# Patient Record
Sex: Female | Born: 1980
Health system: Southern US, Community
[De-identification: ages and names within clinical notes are randomized; demographics above are authoritative.]

## PROBLEM LIST (undated history)

## (undated) DIAGNOSIS — M1990 Unspecified osteoarthritis, unspecified site: Secondary | ICD-10-CM

## (undated) DIAGNOSIS — D649 Anemia, unspecified: Secondary | ICD-10-CM

## (undated) DIAGNOSIS — S199XXA Unspecified injury of neck, initial encounter: Secondary | ICD-10-CM

## (undated) DIAGNOSIS — Z87442 Personal history of urinary calculi: Secondary | ICD-10-CM

## (undated) DIAGNOSIS — D219 Benign neoplasm of connective and other soft tissue, unspecified: Secondary | ICD-10-CM

## (undated) DIAGNOSIS — K219 Gastro-esophageal reflux disease without esophagitis: Secondary | ICD-10-CM

## (undated) HISTORY — PX: WISDOM TOOTH EXTRACTION: SHX21

## (undated) HISTORY — DX: Unspecified osteoarthritis, unspecified site: M19.90

---
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
## 2000-07-19 ENCOUNTER — Inpatient Hospital Stay (HOSPITAL_COMMUNITY): Admission: AD | Admit: 2000-07-19 | Discharge: 2000-07-19 | Payer: Self-pay | Admitting: Obstetrics & Gynecology

## 2000-07-19 ENCOUNTER — Encounter: Payer: Self-pay | Admitting: Obstetrics & Gynecology

## 2003-06-05 ENCOUNTER — Emergency Department (HOSPITAL_COMMUNITY): Admission: EM | Admit: 2003-06-05 | Discharge: 2003-06-05 | Payer: Self-pay | Admitting: Emergency Medicine

## 2005-07-12 ENCOUNTER — Emergency Department (HOSPITAL_COMMUNITY): Admission: EM | Admit: 2005-07-12 | Discharge: 2005-07-12 | Payer: Self-pay | Admitting: Emergency Medicine

## 2006-04-05 ENCOUNTER — Emergency Department (HOSPITAL_COMMUNITY): Admission: EM | Admit: 2006-04-05 | Discharge: 2006-04-05 | Payer: Self-pay | Admitting: Emergency Medicine

## 2008-02-12 ENCOUNTER — Inpatient Hospital Stay (HOSPITAL_COMMUNITY): Admission: AD | Admit: 2008-02-12 | Discharge: 2008-02-12 | Payer: Self-pay | Admitting: Obstetrics & Gynecology

## 2008-03-10 ENCOUNTER — Ambulatory Visit: Payer: Self-pay | Admitting: Family Medicine

## 2008-03-10 LAB — CONVERTED CEMR LAB
AST: 16 units/L (ref 0–37)
Albumin: 4.5 g/dL (ref 3.5–5.2)
BUN: 10 mg/dL (ref 6–23)
Basophils Relative: 0 % (ref 0–1)
Calcium: 10.2 mg/dL (ref 8.4–10.5)
Chloride: 107 meq/L (ref 96–112)
Lymphs Abs: 1.8 10*3/uL (ref 0.7–4.0)
Monocytes Relative: 5 % (ref 3–12)
Neutro Abs: 8.4 10*3/uL — ABNORMAL HIGH (ref 1.7–7.7)
Neutrophils Relative %: 76 % (ref 43–77)
Potassium: 4.2 meq/L (ref 3.5–5.3)
RBC: 5 M/uL (ref 3.87–5.11)
Sodium: 139 meq/L (ref 135–145)
Total Protein: 7.8 g/dL (ref 6.0–8.3)
WBC: 11 10*3/uL — ABNORMAL HIGH (ref 4.0–10.5)

## 2008-04-14 ENCOUNTER — Encounter (INDEPENDENT_AMBULATORY_CARE_PROVIDER_SITE_OTHER): Payer: Self-pay | Admitting: Family Medicine

## 2008-04-14 ENCOUNTER — Ambulatory Visit: Payer: Self-pay | Admitting: Family Medicine

## 2008-04-20 ENCOUNTER — Encounter: Admission: RE | Admit: 2008-04-20 | Discharge: 2008-04-20 | Payer: Self-pay | Admitting: Family Medicine

## 2008-04-28 ENCOUNTER — Ambulatory Visit: Payer: Self-pay | Admitting: Family Medicine

## 2008-07-20 ENCOUNTER — Ambulatory Visit: Payer: Self-pay | Admitting: Internal Medicine

## 2008-10-12 ENCOUNTER — Ambulatory Visit: Payer: Self-pay | Admitting: Internal Medicine

## 2009-02-23 ENCOUNTER — Ambulatory Visit: Payer: Self-pay | Admitting: Family Medicine

## 2009-05-27 ENCOUNTER — Ambulatory Visit: Payer: Self-pay | Admitting: Family Medicine

## 2009-06-22 ENCOUNTER — Ambulatory Visit: Payer: Self-pay | Admitting: Family Medicine

## 2009-11-19 ENCOUNTER — Emergency Department (HOSPITAL_COMMUNITY): Admission: EM | Admit: 2009-11-19 | Discharge: 2009-11-19 | Payer: Self-pay | Admitting: Emergency Medicine

## 2010-01-26 ENCOUNTER — Emergency Department (HOSPITAL_COMMUNITY)
Admission: EM | Admit: 2010-01-26 | Discharge: 2010-01-27 | Payer: Self-pay | Source: Home / Self Care | Admitting: Emergency Medicine

## 2010-05-11 LAB — DIFFERENTIAL
Basophils Absolute: 0 10*3/uL (ref 0.0–0.1)
Eosinophils Relative: 1 % (ref 0–5)
Lymphocytes Relative: 35 % (ref 12–46)
Lymphs Abs: 4.1 10*3/uL — ABNORMAL HIGH (ref 0.7–4.0)
Monocytes Absolute: 0.7 10*3/uL (ref 0.1–1.0)
Neutro Abs: 6.7 10*3/uL (ref 1.7–7.7)

## 2010-05-11 LAB — HEPATIC FUNCTION PANEL
ALT: 11 U/L (ref 0–35)
AST: 20 U/L (ref 0–37)
Alkaline Phosphatase: 77 U/L (ref 39–117)
Bilirubin, Direct: 0.1 mg/dL (ref 0.0–0.3)
Indirect Bilirubin: 0.5 mg/dL (ref 0.3–0.9)

## 2010-05-11 LAB — BASIC METABOLIC PANEL
Chloride: 111 mEq/L (ref 96–112)
GFR calc Af Amer: >60 mL/min (ref >60)
GFR calc non Af Amer: >60 mL/min (ref >60)
Potassium: 3.8 mEq/L (ref 3.5–5.1)
Sodium: 140 mEq/L (ref 135–145)

## 2010-05-11 LAB — URINALYSIS, ROUTINE W REFLEX MICROSCOPIC
Glucose, UA: NEGATIVE mg/dL
Ketones, ur: NEGATIVE mg/dL
Nitrite: NEGATIVE
Specific Gravity, Urine: 1.01 (ref 1.005–1.030)
pH: 7 (ref 5.0–8.0)

## 2010-05-11 LAB — CBC
HCT: 35.4 % — ABNORMAL LOW (ref 36.0–46.0)
Hemoglobin: 11.9 g/dL — ABNORMAL LOW (ref 12.0–15.0)
MCV: 75 fL — ABNORMAL LOW (ref 78.0–100.0)
RBC: 4.72 MIL/uL (ref 3.87–5.11)
WBC: 11.6 10*3/uL — ABNORMAL HIGH (ref 4.0–10.5)

## 2010-10-07 ENCOUNTER — Ambulatory Visit (INDEPENDENT_AMBULATORY_CARE_PROVIDER_SITE_OTHER): Payer: Self-pay

## 2010-10-07 ENCOUNTER — Inpatient Hospital Stay (INDEPENDENT_AMBULATORY_CARE_PROVIDER_SITE_OTHER)
Admission: RE | Admit: 2010-10-07 | Discharge: 2010-10-07 | Disposition: A | Discharge status: Home / Self Care | Payer: Self-pay | Source: Ambulatory Visit | Attending: Emergency Medicine | Admitting: Emergency Medicine

## 2010-10-07 DIAGNOSIS — S139XXA Sprain of joints and ligaments of unspecified parts of neck, initial encounter: Secondary | ICD-10-CM

## 2010-10-07 DIAGNOSIS — IMO0002 Reserved for concepts with insufficient information to code with codable children: Secondary | ICD-10-CM

## 2011-05-29 ENCOUNTER — Emergency Department (HOSPITAL_COMMUNITY)
Admission: EM | Admit: 2011-05-29 | Discharge: 2011-05-29 | Disposition: A | Discharge status: Home / Self Care | Payer: Self-pay | Source: Home / Self Care | Attending: Family Medicine | Admitting: Family Medicine

## 2011-05-29 NOTE — ED Notes (Signed)
Called pt at  2:25 pm  And no answer in lobby

## 2011-05-29 NOTE — ED Notes (Signed)
Pt  Called for 2nd time @ 3:15 pm  Did not answer.

## 2012-03-01 ENCOUNTER — Telehealth (HOSPITAL_COMMUNITY): Payer: Self-pay | Admitting: *Deleted

## 2012-03-01 NOTE — Telephone Encounter (Signed)
Telephoned patient at home # and left message to return call to [facility].

## 2012-03-08 ENCOUNTER — Other Ambulatory Visit: Payer: Self-pay | Admitting: Obstetrics and Gynecology

## 2012-03-08 ENCOUNTER — Encounter (HOSPITAL_COMMUNITY): Payer: Self-pay

## 2012-03-08 ENCOUNTER — Ambulatory Visit (HOSPITAL_COMMUNITY)
Admission: RE | Admit: 2012-03-08 | Discharge: 2012-03-08 | Disposition: A | Discharge status: Home / Self Care | Payer: Self-pay | Source: Ambulatory Visit | Attending: Obstetrics and Gynecology | Admitting: Obstetrics and Gynecology

## 2012-03-08 VITALS — BP 100/64 | Temp 98.9°F | Ht 63.0 in | Wt 126.8 lb

## 2012-03-08 DIAGNOSIS — N63 Unspecified lump in unspecified breast: Secondary | ICD-10-CM

## 2012-03-08 DIAGNOSIS — Z01419 Encounter for gynecological examination (general) (routine) without abnormal findings: Secondary | ICD-10-CM

## 2012-03-08 NOTE — Progress Notes (Signed)
Complaints of right breast lump x 1 year that is painful at times. Patient rates pain at a 2 out of 10.  Pap Smear:  Completed Pap smear today. Last Pap smear was 04/14/2008 and normal. Per patient has no history of abnormal Pap smears. Pap smear result above in EPIC.  Physical exam: Breasts Breasts symmetrical. No skin abnormalities bilateral breasts. No nipple retraction bilateral breasts. No nipple discharge bilateral breasts. No lymphadenopathy. Palpated lump in left breast at 12 o'clock 2 cm from the areola. Palpated 3 lumps in right breast at 10 o'clock 1 cm from the areola, 11 o'clock 1 cm from the areola, and 12 o'clock 1 cm from the areola. Complaints of tenderness when palpated right center breast. Patient referred to the [facility] for diagnostic mammogram and possible ultrasound bilateral breasts. Appointment scheduled for Friday, March 15, 2012 at 1400.         Pelvic/Bimanual   Ext Genitalia No lesions, no swelling and no discharge observed on external genitalia.         Vagina Vagina pink and normal texture. No lesions or discharge observed in vagina.          Cervix Cervix is present. Cervix pink and of normal texture. No discharge observed on cervix.         Uterus Uterus is present and palpable. Uterus in normal position and normal size.       Adnexae Bilateral ovaries present and palpable. No tenderness on palpation.        Rectovaginal No rectal exam completed today since patient had no rectal complaints. No skin abnormalties observed on exam.

## 2012-03-08 NOTE — Addendum Note (Signed)
Encounter addended by: Lurlean Horns, LPN on: 08/17/3084  3:18 PM<BR>     Documentation filed: Visit Diagnoses, Orders

## 2012-03-08 NOTE — Addendum Note (Signed)
Encounter addended by: Saintclair Halsted, RN on: 03/08/2012  4:20 PM<BR>     Documentation filed: Visit Diagnoses, Patient Instructions Section

## 2012-03-08 NOTE — Patient Instructions (Addendum)
Taught patient how to perform BSE and gave educational materials to take home. Let her know [facility] will cover Pap smears every 3 years unless has a history of abnormal Pap smears. Patient referred to the [facility] for diagnostic mammogram and possible ultrasound bilateral breasts. Appointment scheduled for Friday, March 15, 2012 at 1400. Patient aware of appointment and will be there. Let patient know will follow up with her within the next couple weeks with results to Pap smear by letter or phone. Patient verbalized understanding.

## 2012-03-13 ENCOUNTER — Other Ambulatory Visit: Payer: Self-pay | Admitting: *Deleted

## 2012-03-13 DIAGNOSIS — A599 Trichomoniasis, unspecified: Secondary | ICD-10-CM

## 2012-03-13 MED ORDER — METRONIDAZOLE 500 MG PO TABS: 500.0000 mg | ORAL_TABLET | Freq: Two times a day (BID) | ORAL | Status: DC

## 2012-03-15 ENCOUNTER — Ambulatory Visit
Admission: RE | Admit: 2012-03-15 | Discharge: 2012-03-15 | Disposition: A | Discharge status: Home / Self Care | Payer: No Typology Code available for payment source | Source: Ambulatory Visit | Attending: Obstetrics and Gynecology | Admitting: Obstetrics and Gynecology

## 2012-03-15 DIAGNOSIS — N63 Unspecified lump in unspecified breast: Secondary | ICD-10-CM

## 2012-07-06 ENCOUNTER — Emergency Department (HOSPITAL_COMMUNITY)
Admission: EM | Admit: 2012-07-06 | Discharge: 2012-07-06 | Disposition: A | Discharge status: Home / Self Care | Payer: Self-pay | Attending: Emergency Medicine | Admitting: Emergency Medicine

## 2012-07-06 ENCOUNTER — Emergency Department (HOSPITAL_COMMUNITY): Payer: Self-pay

## 2012-07-06 ENCOUNTER — Encounter (HOSPITAL_COMMUNITY): Payer: Self-pay | Admitting: *Deleted

## 2012-07-06 DIAGNOSIS — F17200 Nicotine dependence, unspecified, uncomplicated: Secondary | ICD-10-CM | POA: Insufficient documentation

## 2012-07-06 DIAGNOSIS — Y93E6 Activity, residential relocation: Secondary | ICD-10-CM | POA: Insufficient documentation

## 2012-07-06 DIAGNOSIS — M79672 Pain in left foot: Secondary | ICD-10-CM

## 2012-07-06 DIAGNOSIS — W208XXA Other cause of strike by thrown, projected or falling object, initial encounter: Secondary | ICD-10-CM | POA: Insufficient documentation

## 2012-07-06 DIAGNOSIS — S8990XA Unspecified injury of unspecified lower leg, initial encounter: Secondary | ICD-10-CM | POA: Insufficient documentation

## 2012-07-06 DIAGNOSIS — Y929 Unspecified place or not applicable: Secondary | ICD-10-CM | POA: Insufficient documentation

## 2012-07-06 MED ORDER — IBUPROFEN 400 MG PO TABS
800.0000 mg | ORAL_TABLET | Freq: Once | ORAL | Status: AC
  Administered 2012-07-06: 800 mg via ORAL
  Filled 2012-07-06: qty 2

## 2012-07-06 NOTE — ED Provider Notes (Signed)
History    This chart was scribed for non-physician practitioner working with Carleene Cooper III, MD by Sofie Rower, ED Scribe. This patient was seen in room TR05C/TR05C and the patient's care was started at 6:15Pm.   CSN: [id number]  Arrival date & time 07/06/12  1649   First MD Initiated Contact with Patient 07/06/12 1815      Chief Complaint  Patient presents with  . Foot Swelling    (Consider location/radiation/quality/duration/timing/severity/associated sxs/prior treatment) Patient is a 32 y.o. female presenting with lower extremity pain. The history is provided by the patient. No language interpreter was used.  Foot Pain This is a new problem. The current episode started 1 to 2 hours ago. The problem occurs constantly. The problem has been gradually worsening. Pertinent negatives include no chest pain, no headaches and no shortness of breath. The symptoms are aggravated by bending, stress and walking. Nothing relieves the symptoms. She has tried nothing for the symptoms. The treatment provided no relief.    Katie Benson is a 32 y.o. female , with no known medical hx, who presents to the Emergency Department complaining of sudden, progressively worsening, non radiating, pain located on the dorsum of the left foot distal to the first three metatarsals. Onset today (07/06/12).  Associated symptoms include swelling located at the left foot. The pt reports she was moving and dropped a refrigerator upon her left foot earlier today (07/06/12). Immediately after the refrigerator fell onto her left foot, the pt informs she began to notice an immediate pain, followed by moderate swelling. The pain and swelling has prompted the pt's concern and desire to seek medical evaluation at [facility] this evening (07/06/12). Modifying factors include application of pressure in addition to ambulation, which intensifies the foot pain. Pt took no interventions. Pain is worse with weight bearing.   The pt is a current  everyday smoker, in addition to drinking alcohol occasionally.   Pt does not have a PCP.    History reviewed. No pertinent past medical history.  History reviewed. No pertinent past surgical history.  Family History  Problem Relation Age of Onset  . Diabetes Sister   . Cancer Maternal Grandmother     ovarian    History  Substance Use Topics  . Smoking status: Current Every Day Smoker -- 0.25 packs/day for 14 years    Types: Cigarettes  . Smokeless tobacco: Not on file  . Alcohol Use: Yes     Comment: occassionally on weekends    OB History   Grav Para Term Preterm Abortions TAB SAB Ect Mult Living   2 1 1  1     1       Review of Systems  Constitutional: Negative for fever and diaphoresis.  HENT: Negative for neck pain and neck stiffness.   Eyes: Negative for visual disturbance.  Respiratory: Negative for apnea, chest tightness and shortness of breath.   Cardiovascular: Negative for chest pain and palpitations.  Gastrointestinal: Negative for nausea, vomiting, diarrhea and constipation.  Genitourinary: Negative for dysuria.  Musculoskeletal: Positive for arthralgias. Negative for gait problem.       Left foot pain, top of foot  Skin: Negative for color change, rash and wound.  Neurological: Negative for dizziness, weakness, light-headedness, numbness and headaches.    Allergies  Pork-derived products  Home Medications   Current Outpatient Rx  Name  Route  Sig  Dispense  Refill  . cyclobenzaprine (FLEXERIL) 10 MG tablet   Oral   Take 10 mg by mouth  3 (three) times daily as needed for muscle spasms.            BP 137/80  Pulse 101  Temp(Src) 98.7 F (37.1 C) (Oral)  Resp 20  SpO2 100%  LMP 06/15/2012  Physical Exam  Nursing note and vitals reviewed. Constitutional: She is oriented to person, place, and time. She appears well-developed and well-nourished. No distress.  HENT:  Head: Normocephalic and atraumatic.  Eyes: Conjunctivae and EOM are  normal.  Neck: Normal range of motion. Neck supple.  No meningeal signs  Cardiovascular: Normal rate, regular rhythm, normal heart sounds and intact distal pulses.  Exam reveals no gallop and no friction rub.   No murmur heard. Pulmonary/Chest: Effort normal and breath sounds normal. No respiratory distress. She has no wheezes. She has no rales. She exhibits no tenderness.  Abdominal: Soft. Bowel sounds are normal. She exhibits no distension. There is no tenderness. There is no rebound and no guarding.  Musculoskeletal: Normal range of motion. She exhibits no edema and no tenderness.  Normal strength in upper and lower extremities bilaterally including dorsiflexion and plantar flexion, strong and equal grip strength. FROM to injured left foot. Dorsal pulses intact.   Neurological: She is alert and oriented to person, place, and time. No cranial nerve deficit.  Speech is clear and goal oriented, follows commands Sensation normal to light touch Moves extremities without ataxia, coordination intact Pt able to ambulate with pain to injured left foot  Skin: Skin is warm and dry. She is not diaphoretic. No erythema.  Psychiatric: She has a normal mood and affect.    ED Course  Procedures (including critical care time)  DIAGNOSTIC STUDIES: Oxygen Saturation is 100% on room air, normal by my interpretation.    COORDINATION OF CARE:  6:41 PM- Treatment plan discussed with patient. Pt agrees with treatment.   Medications  ibuprofen (ADVIL,MOTRIN) tablet 800 mg (not administered)      Labs Reviewed - No data to display Dg Foot Complete Left  07/06/2012  *RADIOLOGY REPORT*  Clinical Data: Left foot injury with pain and swelling.  LEFT FOOT - COMPLETE 3+ VIEW  Comparison: None  Findings: There is no evidence of acute fracture, subluxation, or dislocation. The Lisfranc joints are intact. No focal bony lesions are identified. There is no evidence of radiopaque foreign body.  The joint spaces are  unremarkable.  IMPRESSION: Unremarkable left foot.   Original Report Authenticated By: Harmon Pier, M.D.      1. Left foot pain       MDM  Managed pt pain with ibuprofen. Imaging shows no fracture. Directed pt to ice injury, take acetaminophen or ibuprofen for pain, and to elevate and rest the injury when possible. Pt understood diagnosis and was in agreement with plan.   I personally performed the services described in this documentation, which was scribed in my presence. The recorded information has been reviewed and is accurate.    Glade Nurse, PA-C 07/07/12 0000

## 2012-07-06 NOTE — ED Notes (Signed)
Pt in c/o left foot swelling and pain after dropping a refrigerator door on it earlier today. Pt ambulatory to room.

## 2012-07-08 NOTE — ED Provider Notes (Signed)
Medical screening examination/treatment/procedure(s) were performed by non-physician practitioner and as supervising physician I was immediately available for consultation/collaboration.   Carleene Cooper III, MD 07/08/12 1500

## 2012-10-29 ENCOUNTER — Encounter (HOSPITAL_COMMUNITY): Payer: Self-pay

## 2012-10-29 ENCOUNTER — Inpatient Hospital Stay (HOSPITAL_COMMUNITY)
Admission: AD | Admit: 2012-10-29 | Discharge: 2012-10-29 | Disposition: A | Discharge status: Home / Self Care | Payer: Medicaid Other | Source: Ambulatory Visit | Attending: Obstetrics and Gynecology | Admitting: Obstetrics and Gynecology

## 2012-10-29 DIAGNOSIS — A499 Bacterial infection, unspecified: Secondary | ICD-10-CM | POA: Insufficient documentation

## 2012-10-29 DIAGNOSIS — N949 Unspecified condition associated with female genital organs and menstrual cycle: Secondary | ICD-10-CM | POA: Insufficient documentation

## 2012-10-29 DIAGNOSIS — B9689 Other specified bacterial agents as the cause of diseases classified elsewhere: Secondary | ICD-10-CM | POA: Insufficient documentation

## 2012-10-29 DIAGNOSIS — N760 Acute vaginitis: Secondary | ICD-10-CM | POA: Insufficient documentation

## 2012-10-29 HISTORY — DX: Unspecified injury of neck, initial encounter: S19.9XXA

## 2012-10-29 LAB — URINALYSIS, ROUTINE W REFLEX MICROSCOPIC
Glucose, UA: NEGATIVE mg/dL
Leukocytes, UA: NEGATIVE
Protein, ur: NEGATIVE mg/dL
pH: 6 (ref 5.0–8.0)

## 2012-10-29 LAB — WET PREP, GENITAL: Trich, Wet Prep: NONE SEEN

## 2012-10-29 LAB — POCT PREGNANCY, URINE: Preg Test, Ur: NEGATIVE

## 2012-10-29 LAB — URINE MICROSCOPIC-ADD ON

## 2012-10-29 MED ORDER — METRONIDAZOLE 500 MG PO TABS: 500.0000 mg | ORAL_TABLET | Freq: Two times a day (BID) | ORAL | Status: DC

## 2012-10-29 NOTE — MAU Provider Note (Signed)
History     CSN: [id number]  Arrival date and time: 10/29/12 0116   First Provider Initiated Contact with Patient 10/29/12 0151      Chief Complaint  Patient presents with  . Vaginal Pain   Vaginal Pain    Katie Benson is a 32 y.o. G2P1011 who presents today with vaginal discomfort. She states that it has been present for about 2 days. She denies any itching or odor, but she has noticed an increase in vaginal discharge.   Past Medical History  Diagnosis Date  . Neck injury     Past Surgical History  Procedure Laterality Date  . Cesarean section      Family History  Problem Relation Age of Onset  . Diabetes Sister   . Cancer Maternal Grandmother     ovarian    History  Substance Use Topics  . Smoking status: Current Every Day Smoker -- 0.25 packs/day for 14 years    Types: Cigarettes  . Smokeless tobacco: Not on file  . Alcohol Use: Yes     Comment: occassionally on weekends    Allergies:  Allergies  Allergen Reactions  . Pork-Derived Products Hives and Swelling    Prescriptions prior to admission  Medication Sig Dispense Refill  . cyclobenzaprine (FLEXERIL) 10 MG tablet Take 10 mg by mouth 3 (three) times daily as needed for muscle spasms.         Review of Systems  Genitourinary: Positive for vaginal pain.   Physical Exam   Blood pressure 125/65, pulse 93, temperature 98.6 F (37 C), temperature source Oral, resp. rate 20, height 5\' 3"  (1.6 m), weight 55.792 kg (123 lb), last menstrual period 10/07/2012, SpO2 99.00%.  Physical Exam  Nursing note and vitals reviewed. Constitutional: She is oriented to person, place, and time. She appears well-developed and well-nourished. No distress.  Cardiovascular: Normal rate.   Respiratory: Effort normal.  GI: Soft. There is no tenderness.  Genitourinary:   External: no lesion Vagina: small amount of white discharge Cervix: pink, smooth, no CMT Uterus: NSSC Adnexa: NT   Neurological: She is alert  and oriented to person, place, and time.  Skin: Skin is warm and dry.  Psychiatric: She has a normal mood and affect.    MAU Course  Procedures  Results for orders placed during the hospital encounter of 10/29/12 (from the past 24 hour(s))  URINALYSIS, ROUTINE W REFLEX MICROSCOPIC     Status: Abnormal   Collection Time    10/29/12  1:25 AM      Result Value Range   Color, Urine YELLOW  YELLOW   APPearance CLEAR  CLEAR   Specific Gravity, Urine >1.030 (*) 1.005 - 1.030   pH 6.0  5.0 - 8.0   Glucose, UA NEGATIVE  NEGATIVE mg/dL   Hgb urine dipstick TRACE (*) NEGATIVE   Bilirubin Urine NEGATIVE  NEGATIVE   Ketones, ur 15 (*) NEGATIVE mg/dL   Protein, ur NEGATIVE  NEGATIVE mg/dL   Urobilinogen, UA 0.2  0.0 - 1.0 mg/dL   Nitrite NEGATIVE  NEGATIVE   Leukocytes, UA NEGATIVE  NEGATIVE  URINE MICROSCOPIC-ADD ON     Status: Abnormal   Collection Time    10/29/12  1:25 AM      Result Value Range   Squamous Epithelial / LPF RARE  RARE   WBC, UA 0-2  <3 WBC/hpf   RBC / HPF 3-6  <3 RBC/hpf   Crystals CA OXALATE CRYSTALS (*) NEGATIVE   Urine-Other MUCOUS PRESENT  POCT PREGNANCY, URINE     Status: None   Collection Time    10/29/12  1:30 AM      Result Value Range   Preg Test, Ur NEGATIVE  NEGATIVE  WET PREP, GENITAL     Status: Abnormal   Collection Time    10/29/12  2:00 AM      Result Value Range   Yeast Wet Prep HPF POC NONE SEEN  NONE SEEN   Trich, Wet Prep NONE SEEN  NONE SEEN   Clue Cells Wet Prep HPF POC MODERATE (*) NONE SEEN   WBC, Wet Prep HPF POC FEW (*) NONE SEEN     Assessment and Plan   1. BV (bacterial vaginosis)    RX: flagyl 500mg  BID #14 FU as needed  Tawnya Crook 10/29/2012, 2:01 AM

## 2012-10-29 NOTE — MAU Provider Note (Signed)
Attestation of Attending Supervision of Advanced Practitioner (CNM/NP): Evaluation and management procedures were performed by the Advanced Practitioner under my supervision and collaboration.  I have reviewed the Advanced Practitioner's note and chart, and I agree with the management and plan.  Trinten Boudoin 10/29/2012 8:06 AM   

## 2012-10-31 ENCOUNTER — Inpatient Hospital Stay (HOSPITAL_COMMUNITY)
Admission: AD | Admit: 2012-10-31 | Discharge: 2012-11-01 | Disposition: A | Discharge status: Home / Self Care | Payer: Medicaid Other | Source: Ambulatory Visit | Attending: Obstetrics & Gynecology | Admitting: Obstetrics & Gynecology

## 2012-10-31 DIAGNOSIS — N949 Unspecified condition associated with female genital organs and menstrual cycle: Secondary | ICD-10-CM | POA: Insufficient documentation

## 2012-10-31 DIAGNOSIS — N750 Cyst of Bartholin's gland: Secondary | ICD-10-CM
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
## 2012-11-01 ENCOUNTER — Encounter (HOSPITAL_COMMUNITY): Payer: Self-pay | Admitting: *Deleted

## 2012-11-01 DIAGNOSIS — N750 Cyst of Bartholin's gland: Secondary | ICD-10-CM

## 2012-11-01 MED ORDER — LIDOCAINE HCL 2 % EX GEL
CUTANEOUS | Status: DC
  Filled 2012-11-01: qty 20

## 2012-11-01 MED ORDER — LIDOCAINE HCL 2 % EX GEL: Freq: Three times a day (TID) | CUTANEOUS | Status: DC | PRN
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
## 2012-11-01 NOTE — MAU Provider Note (Signed)
Chief Complaint: Groin Swelling   First Provider Initiated Contact with Patient 11/01/12 0043     SUBJECTIVE HPI: Katie Benson is a 32 y.o. G2P1011 who presents to maternity admissions reporting vaginal swelling and pain that started after her pelvic exam in MAU 2 days ago. She reports she felt a pinch when the midwife was doing the exam and then developed pain which has gradually gotten more severe.  She denies vaginal bleeding, vaginal itching/burning, urinary symptoms, h/a, dizziness, n/v, or fever/chills.   Past Medical History  Diagnosis Date  . Neck injury    Past Surgical History  Procedure Laterality Date  . Cesarean section     History   Social History  . Marital Status: Single    Spouse Name: N/A    Number of Children: N/A  . Years of Education: N/A   Occupational History  . Not on file.   Social History Main Topics  . Smoking status: Current Every Day Smoker -- 0.25 packs/day for 14 years    Types: Cigarettes  . Smokeless tobacco: Not on file  . Alcohol Use: Yes     Comment: occassionally on weekends  . Drug Use: No  . Sexual Activity: Yes    Birth Control/ Protection: Condom, None   Other Topics Concern  . Not on file   Social History Narrative  . No narrative on file   No current facility-administered medications on file prior to encounter.   Current Outpatient Prescriptions on File Prior to Encounter  Medication Sig Dispense Refill  . cyclobenzaprine (FLEXERIL) 10 MG tablet Take 10 mg by mouth 3 (three) times daily as needed for muscle spasms.       . metroNIDAZOLE (FLAGYL) 500 MG tablet Take 1 tablet (500 mg total) by mouth 2 (two) times daily.  14 tablet  0   Allergies  Allergen Reactions  . Pork-Derived Products Hives and Swelling    ROS: Pertinent items in HPI  OBJECTIVE Blood pressure 111/71, pulse 69, temperature 98.7 F (37.1 C), temperature source Oral, resp. rate 18, last menstrual period 10/07/2012, SpO2 100.00%. GENERAL:  Well-developed, well-nourished female in no acute distress.  HEENT: Normocephalic HEART: normal rate RESP: normal effort ABDOMEN: Soft, non-tender EXTREMITIES: Nontender, no edema NEURO: Alert and oriented SPECULUM EXAM: Deferred  Visual inspection of external genitals reveals enlarged, soft, 3-4 cm cyst of left labia without erythema.    ASSESSMENT 1. Bartholin gland cyst     PLAN Discharge home Topical lidocaine Jelly Pt may take OTC Aleve, may take 2 BID x2-3 days Return to MAU as needed    Medication List    ASK your doctor about these medications       cyclobenzaprine 10 MG tablet  Commonly known as:  FLEXERIL  Take 10 mg by mouth 3 (three) times daily as needed for muscle spasms.     metroNIDAZOLE 500 MG tablet  Commonly known as:  FLAGYL  Take 1 tablet (500 mg total) by mouth 2 (two) times daily.         Sharen Counter Certified Nurse-Midwife 11/01/2012  12:59 AM

## 2012-12-25 DIAGNOSIS — Y9389 Activity, other specified: Secondary | ICD-10-CM | POA: Insufficient documentation

## 2012-12-25 DIAGNOSIS — F17200 Nicotine dependence, unspecified, uncomplicated: Secondary | ICD-10-CM | POA: Insufficient documentation

## 2012-12-25 DIAGNOSIS — Z87828 Personal history of other (healed) physical injury and trauma: Secondary | ICD-10-CM | POA: Insufficient documentation

## 2012-12-25 DIAGNOSIS — S139XXA Sprain of joints and ligaments of unspecified parts of neck, initial encounter: Secondary | ICD-10-CM | POA: Insufficient documentation

## 2012-12-25 DIAGNOSIS — Y92410 Unspecified street and highway as the place of occurrence of the external cause: Secondary | ICD-10-CM | POA: Insufficient documentation

## 2012-12-26 ENCOUNTER — Emergency Department (HOSPITAL_COMMUNITY): Payer: Medicaid Other

## 2012-12-26 ENCOUNTER — Emergency Department (HOSPITAL_COMMUNITY)
Admission: EM | Admit: 2012-12-26 | Discharge: 2012-12-26 | Disposition: A | Discharge status: Home / Self Care | Payer: Medicaid Other | Attending: Emergency Medicine | Admitting: Emergency Medicine

## 2012-12-26 ENCOUNTER — Encounter (HOSPITAL_COMMUNITY): Payer: Self-pay | Admitting: Emergency Medicine

## 2012-12-26 DIAGNOSIS — S161XXA Strain of muscle, fascia and tendon at neck level, initial encounter: Secondary | ICD-10-CM

## 2012-12-26 MED ORDER — OXYCODONE-ACETAMINOPHEN 5-325 MG PO TABS
2.0000 | ORAL_TABLET | Freq: Once | ORAL | Status: AC
  Administered 2012-12-26: 2 via ORAL
  Filled 2012-12-26: qty 2

## 2012-12-26 MED ORDER — DIAZEPAM 5 MG PO TABS
5.0000 mg | ORAL_TABLET | Freq: Once | ORAL | Status: AC
  Administered 2012-12-26: 5 mg via ORAL
  Filled 2012-12-26: qty 1

## 2012-12-26 MED ORDER — DIAZEPAM 5 MG PO TABS: 5.0000 mg | ORAL_TABLET | Freq: Four times a day (QID) | ORAL | Status: DC | PRN

## 2012-12-26 MED ORDER — OXYCODONE-ACETAMINOPHEN 5-325 MG PO TABS: 1.0000 | ORAL_TABLET | Freq: Four times a day (QID) | ORAL | Status: DC | PRN

## 2012-12-26 NOTE — ED Notes (Signed)
To x-ray

## 2012-12-26 NOTE — ED Provider Notes (Signed)
Medical screening examination/treatment/procedure(s) were performed by non-physician practitioner and as supervising physician I was immediately available for consultation/collaboration.   Dione Booze, MD 12/26/12 [phone]
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
## 2013-01-14 ENCOUNTER — Emergency Department (HOSPITAL_COMMUNITY)
Admission: EM | Admit: 2013-01-14 | Discharge: 2013-01-14 | Disposition: A | Discharge status: Home / Self Care | Payer: No Typology Code available for payment source | Attending: Emergency Medicine | Admitting: Emergency Medicine

## 2013-01-14 ENCOUNTER — Encounter (HOSPITAL_COMMUNITY): Payer: Self-pay | Admitting: Emergency Medicine

## 2013-01-14 ENCOUNTER — Emergency Department (HOSPITAL_COMMUNITY): Payer: No Typology Code available for payment source

## 2013-01-14 DIAGNOSIS — R51 Headache: Secondary | ICD-10-CM | POA: Insufficient documentation

## 2013-01-14 DIAGNOSIS — M25519 Pain in unspecified shoulder: Secondary | ICD-10-CM | POA: Insufficient documentation

## 2013-01-14 DIAGNOSIS — Z3202 Encounter for pregnancy test, result negative: Secondary | ICD-10-CM | POA: Insufficient documentation

## 2013-01-14 DIAGNOSIS — M25562 Pain in left knee: Secondary | ICD-10-CM

## 2013-01-14 DIAGNOSIS — M542 Cervicalgia: Secondary | ICD-10-CM

## 2013-01-14 DIAGNOSIS — M25569 Pain in unspecified knee: Secondary | ICD-10-CM | POA: Insufficient documentation

## 2013-01-14 DIAGNOSIS — G8911 Acute pain due to trauma: Secondary | ICD-10-CM | POA: Insufficient documentation

## 2013-01-14 DIAGNOSIS — M25511 Pain in right shoulder: Secondary | ICD-10-CM

## 2013-01-14 DIAGNOSIS — F17200 Nicotine dependence, unspecified, uncomplicated: Secondary | ICD-10-CM | POA: Insufficient documentation

## 2013-01-14 DIAGNOSIS — Z87828 Personal history of other (healed) physical injury and trauma: Secondary | ICD-10-CM | POA: Insufficient documentation

## 2013-01-14 DIAGNOSIS — M549 Dorsalgia, unspecified: Secondary | ICD-10-CM | POA: Insufficient documentation

## 2013-01-14 DIAGNOSIS — M79609 Pain in unspecified limb: Secondary | ICD-10-CM | POA: Insufficient documentation

## 2013-01-14 LAB — URINALYSIS, ROUTINE W REFLEX MICROSCOPIC
Bilirubin Urine: NEGATIVE
Glucose, UA: NEGATIVE mg/dL
Hgb urine dipstick: NEGATIVE
Ketones, ur: NEGATIVE mg/dL
Protein, ur: NEGATIVE mg/dL
pH: 6.5 (ref 5.0–8.0)

## 2013-01-14 LAB — URINE MICROSCOPIC-ADD ON

## 2013-01-14 MED ORDER — NAPROXEN 500 MG PO TABS: 500.0000 mg | ORAL_TABLET | Freq: Two times a day (BID) | ORAL | Status: DC

## 2013-01-14 MED ORDER — CYCLOBENZAPRINE HCL 5 MG PO TABS
5.0000 mg | ORAL_TABLET | Freq: Three times a day (TID) | ORAL | Status: DC | PRN
Start: 2013-01-14 — End: 2013-12-01

## 2013-01-14 MED ORDER — ACETAMINOPHEN 325 MG PO TABS
650.0000 mg | ORAL_TABLET | Freq: Once | ORAL | Status: AC
  Administered 2013-01-14: 650 mg via ORAL
  Filled 2013-01-14: qty 2

## 2013-01-14 MED ORDER — KETOROLAC TROMETHAMINE 30 MG/ML IJ SOLN
30.0000 mg | Freq: Once | INTRAMUSCULAR | Status: AC
  Administered 2013-01-14: 30 mg via INTRAMUSCULAR
  Filled 2013-01-14: qty 1
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
## 2013-01-14 NOTE — Discharge Instructions (Signed)
Follow-up with a primary doctor for further evaluation and management  Use flexeril as needed for muscle spasm - take at night, do not drink alcohol or drive while taking this medication  Take Naprosyn twice daily with food for pain  Rest, ice, elevate your knee/shoulder for pain control  Return to the ED if you have any loss of sensation, weakness, fever, spreading redness/swelling, or other concerns (see below)      Shoulder Pain The shoulder is the joint that connects your arms to your body. The bones that form the shoulder joint include the upper arm bone (humerus), the shoulder blade (scapula), and the collarbone (clavicle). The top of the humerus is shaped like a ball and fits into a rather flat socket on the scapula (glenoid cavity). A combination of muscles and strong, fibrous tissues that connect muscles to bones (tendons) support your shoulder joint and hold the ball in the socket. Small, fluid-filled sacs (bursae) are located in different areas of the joint. They act as cushions between the bones and the overlying soft tissues and help reduce friction between the gliding tendons and the bone as you move your arm. Your shoulder joint allows a wide range of motion in your arm. This range of motion allows you to do things like scratch your back or throw a ball. However, this range of motion also makes your shoulder more prone to pain from overuse and injury. Causes of shoulder pain can originate from both injury and overuse and usually can be grouped in the following four categories:  Redness, swelling, and pain (inflammation) of the tendon (tendinitis) or the bursae (bursitis).  Instability, such as a dislocation of the joint.  Inflammation of the joint (arthritis).  Broken bone (fracture). HOME CARE INSTRUCTIONS   Apply ice to the sore area.  Put ice in a plastic bag.  Place a towel between your skin and the bag.  Leave the ice on for 15-20 minutes, 03-04 times per day for the  first 2 days.  Stop using cold packs if they do not help with the pain.  If you have a shoulder sling or immobilizer, wear it as long as your caregiver instructs. Only remove it to shower or bathe. Move your arm as little as possible, but keep your hand moving to prevent swelling.  Squeeze a soft ball or foam pad as much as possible to help prevent swelling.  Only take over-the-counter or prescription medicines for pain, discomfort, or fever as directed by your caregiver. SEEK MEDICAL CARE IF:   Your shoulder pain increases, or new pain develops in your arm, hand, or fingers.  Your hand or fingers become cold and numb.  Your pain is not relieved with medicines. SEEK IMMEDIATE MEDICAL CARE IF:   Your arm, hand, or fingers are numb or tingling.  Your arm, hand, or fingers are significantly swollen or turn white or blue. MAKE SURE YOU:   Understand these instructions.  Will watch your condition.  Will get help right away if you are not doing well or get worse. Document Released: 11/23/2004 Document Revised: 11/08/2011 Document Reviewed: 01/28/2011 [facility] Patient Information 2014 [place], [place].  Knee Pain Knee pain can be a result of an injury or other medical conditions. Treatment will depend on the cause of your pain. HOME CARE  Only take medicine as told by your doctor.  Keep a healthy weight. Being overweight can make the knee hurt more.  Stretch before exercising or playing sports.  If there is constant  knee pain, change the way you exercise. Ask your doctor for advice.  Make sure shoes fit well. Choose the right shoe for the sport or activity.  Protect your knees. Wear kneepads if needed.  Rest when you are tired. GET HELP RIGHT AWAY IF:   Your knee pain does not stop.  Your knee pain does not get better.  Your knee joint feels hot to the touch.  You have a fever. MAKE SURE YOU:   Understand these instructions.  Will watch this condition.  Will get  help right away if you are not doing well or get worse. Document Released: 05/12/2008 Document Revised: 05/08/2011 Document Reviewed: 05/12/2008 [facility] Patient Information 2014 [place], [place].  Cervical Sprain A cervical sprain is an injury in the neck in which the ligaments are stretched or torn. The ligaments are the tissues that hold the bones of the neck (vertebrae) in place.Cervical sprains can range from very mild to very severe. Most cervical sprains get better in 1 to 3 weeks, but it depends on the cause and extent of the injury. Severe cervical sprains can cause the neck vertebrae to be unstable. This can lead to damage of the spinal cord and can result in serious nervous system problems. Your caregiver will determine whether your cervical sprain is mild or severe. CAUSES  Severe cervical sprains may be caused by:  Contact sport injuries (football, rugby, wrestling, hockey, auto racing, gymnastics, diving, martial arts, boxing).  Motor vehicle collisions.  Whiplash injuries. This means the neck is forcefully whipped backward and forward.  Falls. Mild cervical sprains may be caused by:   Awkward positions, such as cradling a telephone between your ear and shoulder.  Sitting in a chair that does not offer proper support.  Working at a poorly Marketing executive station.  Activities that require looking up or down for long periods of time. SYMPTOMS   Pain, soreness, stiffness, or a burning sensation in the front, back, or sides of the neck. This discomfort may develop immediately after injury or it may develop slowly and not begin for 24 hours or more after an injury.  Pain or tenderness directly in the middle of the back of the neck.  Shoulder or upper back pain.  Limited ability to move the neck.  Headache.  Dizziness.  Weakness, numbness, or tingling in the hands or arms.  Muscle spasms.  Difficulty swallowing or chewing.  Tenderness and swelling of the  neck. DIAGNOSIS  Most of the time, your caregiver can diagnose this problem by taking your history and doing a physical exam. Your caregiver will ask about any known problems, such as arthritis in the neck or a previous neck injury. X-rays may be taken to find out if there are any other problems, such as problems with the bones of the neck. However, an X-ray often does not reveal the full extent of a cervical sprain. Other tests such as a computed tomography (CT) scan or magnetic resonance imaging (MRI) may be needed. TREATMENT  Treatment depends on the severity of the cervical sprain. Mild sprains can be treated with rest, keeping the neck in place (immobilization), and pain medicines. Severe cervical sprains need immediate immobilization and an appointment with an orthopedist or neurosurgeon. Several treatment options are available to help with pain, muscle spasms, and other symptoms. Your caregiver may prescribe:  Medicines, such as pain relievers, numbing medicines, or muscle relaxants.  Physical therapy. This can include stretching exercises, strengthening exercises, and posture training. Exercises and improved posture  can help stabilize the neck, strengthen muscles, and help stop symptoms from returning.  A neck collar to be worn for short periods of time. Often, these collars are worn for comfort. However, certain collars may be worn to protect the neck and prevent further worsening of a serious cervical sprain. HOME CARE INSTRUCTIONS   Put ice on the injured area.  Put ice in a plastic bag.  Place a towel between your skin and the bag.  Leave the ice on for 15-20 minutes, 03-04 times a day.  Only take over-the-counter or prescription medicines for pain, discomfort, or fever as directed by your caregiver.  Keep all follow-up appointments as directed by your caregiver.  Keep all physical therapy appointments as directed by your caregiver.  If a neck collar is prescribed, wear it as  directed by your caregiver.  Do not drive while wearing a neck collar.  Make any needed adjustments to your work station to promote good posture.  Avoid positions and activities that make your symptoms worse.  Warm up and stretch before being active to help prevent problems. SEEK MEDICAL CARE IF:   Your pain is not controlled with medicine.  You are unable to decrease your pain medicine over time as planned.  Your activity level is not improving as expected. SEEK IMMEDIATE MEDICAL CARE IF:   You develop any bleeding, stomach upset, or signs of an allergic reaction to your medicine.  Your symptoms get worse.  You develop new, unexplained symptoms.  You have numbness, tingling, weakness, or paralysis in any part of your body. MAKE SURE YOU:   Understand these instructions.  Will watch your condition.  Will get help right away if you are not doing well or get worse. Document Released: 12/11/2006 Document Revised: 05/08/2011 Document Reviewed: 08/21/2012 [facility] Patient Information 2014 [place], [place].  RESOURCE GUIDE  Chronic Pain Problems: Contact [facility]  [phone] Patients need to be referred by their primary care doctor.  Insufficient Money for Medicine: Contact United Way:  call [phone]  No Primary Care Doctor: - Call Health Connect  [phone] - can help you locate a primary care doctor that  accepts your insurance, provides certain services, etc. - Physician Referral Service- [phone]  Agencies that provide inexpensive medical care: - [facility]  [phone] - [facility]  [phone] - [facility]  [phone] - Women's Clinic  [phone] - Planned Parenthood  [phone] [facility]  [phone]  Medicaid-accepting [facility] Providers: - [facility]- [address]  [phone], Mon-Fri 9am-7pm, Sat 9am-1pm - [facility]- [address] [place], Suite [place]  [phone] - [facility]- [address] [place]  [phone] [facility]- [address]  [phone] - Renaye Rakers- [address] [place], [address], [phone]  Only accepts [place] patients after they have their name  applied to their card  Self Pay (no insurance) in [place]: - Sickle Cell Patients - [facility]  [address] [place], [phone] - [facility]- [address] [place]  [phone]       [facility] [place]- [address]       -     [facility]- see information above (Speak to [employer] if you do not have insurance)       -  [facility]- [address],  [phone]       -  [facility]- [address], [phone]       -  Dr Julio Sicks-  [address], [place], [phone]       -  [facility] - [address], [phone]       -  [facility]- [address], [phone], also [address], [phone]       -     [facility]- [address] [place], [phone], 1st & 3rd Saturday         every month, 10am-1pm  -     [facility]   [address] [place], [place].   Phone:  [phone], Fax:  [phone]. Hours of Operation:  9 am - 6 pm, M-F.  -     [facility]   [address], [place]   Phone: [phone], Fax: [phone]. Hours of Operation:  8:30 am - 5:30 pm, M-F.  [facility] [address] [place], [place] [address] [phone]  [facility] [address] [place], [place] [address] [phone]  1) Find a Doctor and Pay Out of Pocket Although you won't have to find out who is covered by your insurance plan, it is a good idea to ask around and get recommendations. You will then need to call the office and see if the  doctor you have chosen will accept you as a new patient and what types of options they offer for patients who are self-pay. Some doctors offer discounts or will set up payment plans for their patients who do not have insurance, but you will need to ask so you aren't surprised when you get to your appointment.  2) Contact Your Local Health Department Not all health departments have doctors that can see patients for sick visits, but many do, so it is worth a call to see if yours does. If you don't know where your local health department is, you can check in your phone book. The CDC also has a tool to help you locate your state's health department, and many state websites also have listings of all of their local health departments.  3) Find a Walk-in Clinic If your illness is not likely to be very severe or complicated, you may want to try a walk in clinic. These are popping up all over the country in pharmacies, drugstores, and shopping centers. They're usually staffed by nurse practitioners or physician assistants that have been trained to treat common illnesses and complaints. They're usually fairly quick and inexpensive. However, if you have serious medical issues or chronic medical problems, these are probably not your best option  STD Testing - [facility] Department of [facility], STD Clinic, [address] [place], phone [phone] or [phone].  Monday - Friday, call for an appointment. [facility] Department of [employer], STD Clinic, [address], [place], phone [phone] or [phone].  Monday - Friday, call for an appointment.  Abuse/Neglect: [facility] Child Abuse Hotline [phone] [facility] Child Abuse Hotline 910-085-0618 (After Hours)  Emergency Shelter:  [facility] 920-657-8082  Maternity Homes: - Room at the [place] [facility] [phone] - [facility] [phone]  MRSA Hotline #:   408-715-6905  Dental Assistance If unable to pay or uninsured, contact:  [facility]. to become qualified for the adult dental clinic.  Patients with Medicaid: [facility] [phone] [address], [phone] [address] [phone]  If unable to pay, or uninsured, contact [facility] [phone] in [place], [phone] in [facility]) to become qualified for the adult dental clinic  [facility] [address] [place], [place] [address] [phone] [email or web address]  Other Proofreader Services: - [facility]- [address] [place], [place], [place], [address], [phone], Ext. 123, 2nd and 4th Thursday of the month at 6:30am.  10 clients each day by appointment, can sometimes see walk-in patients if someone does not show for an appointment. [facility]- [address] [place], [place], [address], [phone] - [facility] [address], [place], [place], [address], [phone] - [facility]- [phone] [facility] Health Department- [phone] [facility] Health [phone]       Behavioral Health Resources in the [facility]  Intensive Outpatient Programs: [facility]      [address] [place], [place] [phone] Both a day and evening program       [facility]     [address]        [place], [place] [address] [phone]         ADS: [facility] [address] [place] [phone]  [facility] Mental Health ACCESS LINE: (307) 696-1359 or [phone] [address] [place], [place] [address] [email or web address]   Substance Abuse Resources: - [facility] Drug [facility]  551-095-7348 - [facility] [phone] - [facility] [place] [phone] Floydene Flock  ([phone] - Residential & Outpatient Substance Abuse Program  [phone]  Psychological Services: [facility]  [phone] [facility]  [phone] - [facility], ([phone] [place]. [address] [place], ACCESS LINE: (364)346-4752 or 951-032-8693, [email or web address]  Mobile Crisis Teams:                                        Therapeutic Alternatives         Mobile Crisis Care Unit [phone]             [facility] [address] Ginette Otto [phone]                                         Interventionist [address] [place] [phone]  Self-Help/Support Groups: Mental Health Assoc. of [employer] of support groups 289-402-3799 (call for more info)  Narcotics Anonymous (NA) Caring Services [address] [place] - 2 meetings at this location  Residential Treatment Programs:  [facility]      [address]        [place]       [phone]         [facility] [address], [place] [address] [place], [place]  [address] [phone]  [facility] Treatment Facility  [address] [place], [place] [address] [phone] Admissions: 8am-3pm M-F  [facility]     [address]  [place], [place] [address]       [phone]         [facility] [address] [place], [place] [phone]  [facility] [address] [place], [place] [phone]  [facility] - Intensive Outpatient      [address]     [place], [place]       [phone]         [facility] (Addiction [facility]     [address] [place], [place] [phone] or [phone]  Residential Treatment Services (RTS), Medicaid [address] [place], [place] [phone]  [facility] [address]                                                [address] [place] [phone]  [facility] Resources: [phone]               General Therapy                                                Angie Fava, PhD        [address] [place], [place] [address]         [phone]   Insurance  [facility]   [address] [place], [place] [address] [phone]  [facility] [address] [place], [place] [address] [phone] Insurance/Medicaid/sponsorship through [facility] and Families                                              [address]                                        [place], [place] [address]    Therapy/tele-psych/case         [phone]          [facility] [address], [place]  [address]  Adolescent/group home/case management [phone]                                           Creola Corn PhD       General therapy       Insurance   [phone]         Dr. Lolly Mustache, [place], M-F [phone]  [facility]  United Way [facility]. [address]                 [address]         [address] [place] [address]  [address]  Cristobal Goldmann Phone:  [phone]                                  Phone:  [phone]                   Phone:  [phone]  [facility], [phone] - [facility] - [facility]- [phone]       -     [facility] in [place], [address]             [phone], [facility] Child Abuse Hotline 9852164324 or [phone] (After Hours)
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
## 2013-01-15 LAB — URINE CULTURE: Special Requests: NORMAL

## 2013-01-16 NOTE — ED Provider Notes (Signed)
Medical screening examination/treatment/procedure(s) were performed by non-physician practitioner and as supervising physician I was immediately available for consultation/collaboration.  Gavin Pound. Herminio Kniskern, MD 01/16/13 1610

## 2013-09-16 ENCOUNTER — Encounter (HOSPITAL_COMMUNITY): Payer: Self-pay | Admitting: Emergency Medicine

## 2013-09-16 ENCOUNTER — Emergency Department (HOSPITAL_COMMUNITY)
Admission: EM | Admit: 2013-09-16 | Discharge: 2013-09-16 | Disposition: A | Discharge status: Home / Self Care | Payer: Medicaid Other | Attending: Emergency Medicine | Admitting: Emergency Medicine

## 2013-09-16 DIAGNOSIS — Z791 Long term (current) use of non-steroidal anti-inflammatories (NSAID): Secondary | ICD-10-CM | POA: Diagnosis not present

## 2013-09-16 DIAGNOSIS — L02219 Cutaneous abscess of trunk, unspecified: Secondary | ICD-10-CM | POA: Diagnosis not present

## 2013-09-16 DIAGNOSIS — Z87828 Personal history of other (healed) physical injury and trauma: Secondary | ICD-10-CM | POA: Insufficient documentation

## 2013-09-16 DIAGNOSIS — F17200 Nicotine dependence, unspecified, uncomplicated: Secondary | ICD-10-CM | POA: Insufficient documentation

## 2013-09-16 DIAGNOSIS — Z79899 Other long term (current) drug therapy: Secondary | ICD-10-CM | POA: Diagnosis not present

## 2013-09-16 DIAGNOSIS — L02214 Cutaneous abscess of groin: Secondary | ICD-10-CM

## 2013-09-16 DIAGNOSIS — L03319 Cellulitis of trunk, unspecified: Principal | ICD-10-CM

## 2013-09-16 MED ORDER — CYCLOBENZAPRINE HCL 10 MG PO TABS: 10.0000 mg | ORAL_TABLET | Freq: Three times a day (TID) | ORAL | Status: DC | PRN

## 2013-09-16 MED ORDER — HYDROCODONE-ACETAMINOPHEN 5-325 MG PO TABS: 1.0000 | ORAL_TABLET | ORAL | Status: DC | PRN

## 2013-09-16 NOTE — Discharge Instructions (Signed)
Use warm compresses over you infection. Have a recheck of your infection in 2 days. Return sooner if you have worsening symptoms.   Abscess An abscess is an infected area that contains a collection of pus and debris.It can occur in almost any part of the body. An abscess is also known as a furuncle or boil. CAUSES  An abscess occurs when tissue gets infected. This can occur from blockage of oil or sweat glands, infection of hair follicles, or a minor injury to the skin. As the body tries to fight the infection, pus collects in the area and creates pressure under the skin. This pressure causes pain. People with weakened immune systems have difficulty fighting infections and get certain abscesses more often.  SYMPTOMS Usually an abscess develops on the skin and becomes a painful mass that is red, warm, and tender. If the abscess forms under the skin, you may feel a moveable soft area under the skin. Some abscesses break open (rupture) on their own, but most will continue to get worse without care. The infection can spread deeper into the body and eventually into the bloodstream, causing you to feel ill.  DIAGNOSIS  Your caregiver will take your medical history and perform a physical exam. A sample of fluid may also be taken from the abscess to determine what is causing your infection. TREATMENT  Your caregiver may prescribe antibiotic medicines to fight the infection. However, taking antibiotics alone usually does not cure an abscess. Your caregiver may need to make a small cut (incision) in the abscess to drain the pus. In some cases, gauze is packed into the abscess to reduce pain and to continue draining the area. HOME CARE INSTRUCTIONS   Only take over-the-counter or prescription medicines for pain, discomfort, or fever as directed by your caregiver.  If you were prescribed antibiotics, take them as directed. Finish them even if you start to feel better.  If gauze is used, follow your  caregiver's directions for changing the gauze.  To avoid spreading the infection:  Keep your draining abscess covered with a bandage.  Wash your hands well.  Do not share personal care items, towels, or whirlpools with others.  Avoid skin contact with others.  Keep your skin and clothes clean around the abscess.  Keep all follow-up appointments as directed by your caregiver. SEEK MEDICAL CARE IF:   You have increased pain, swelling, redness, fluid drainage, or bleeding.  You have muscle aches, chills, or a general ill feeling.  You have a fever. MAKE SURE YOU:   Understand these instructions.  Will watch your condition.  Will get help right away if you are not doing well or get worse. Document Released: 11/23/2004 Document Revised: 08/15/2011 Document Reviewed: 04/28/2011 [facility] Patient Information 2015 [place], [place]. This information is not intended to replace advice given to you by your health care provider. Make sure you discuss any questions you have with your health care provider.
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
## 2013-09-16 NOTE — ED Notes (Signed)
Pt has an abscess in her left groin area for the past couple of days. Pt states that she noticed it and it has grown in size and is slightly painful.

## 2013-09-16 NOTE — ED Notes (Signed)
PA at bedside with RN to perform I&D of abscess to right inner thigh. Pt states that she recently shaved but used a new razor. Pt also states that she has tried heat, and alcohol pads to help the abscess form and pop. Pt states it conitnued to grow and then became painful.

## 2013-09-17 NOTE — ED Provider Notes (Signed)
Medical screening examination/treatment/procedure(s) were performed by non-physician practitioner and as supervising physician I was immediately available for consultation/collaboration.   EKG Interpretation None        Elyn Peers, MD 09/17/13 [phone]

## 2013-12-01 ENCOUNTER — Emergency Department (HOSPITAL_COMMUNITY)
Admission: EM | Admit: 2013-12-01 | Discharge: 2013-12-02 | Disposition: A | Discharge status: Home / Self Care | Payer: Medicaid Other | Attending: Emergency Medicine | Admitting: Emergency Medicine

## 2013-12-01 ENCOUNTER — Encounter (HOSPITAL_COMMUNITY): Payer: Self-pay | Admitting: Emergency Medicine

## 2013-12-01 DIAGNOSIS — M25512 Pain in left shoulder: Secondary | ICD-10-CM | POA: Diagnosis not present

## 2013-12-01 DIAGNOSIS — Z23 Encounter for immunization: Secondary | ICD-10-CM | POA: Insufficient documentation

## 2013-12-01 DIAGNOSIS — M549 Dorsalgia, unspecified: Secondary | ICD-10-CM | POA: Diagnosis present

## 2013-12-01 DIAGNOSIS — L02214 Cutaneous abscess of groin: Secondary | ICD-10-CM | POA: Diagnosis not present

## 2013-12-01 DIAGNOSIS — Z87828 Personal history of other (healed) physical injury and trauma: Secondary | ICD-10-CM | POA: Diagnosis not present

## 2013-12-01 DIAGNOSIS — Z720 Tobacco use: Secondary | ICD-10-CM | POA: Insufficient documentation

## 2013-12-01 MED ORDER — TETANUS-DIPHTH-ACELL PERTUSSIS 5-2.5-18.5 LF-MCG/0.5 IM SUSP
0.5000 mL | Freq: Once | INTRAMUSCULAR | Status: AC
  Administered 2013-12-01: 0.5 mL via INTRAMUSCULAR
  Filled 2013-12-01: qty 0.5

## 2013-12-01 MED ORDER — LIDOCAINE-EPINEPHRINE (PF) 2 %-1:200000 IJ SOLN
10.0000 mL | Freq: Once | INTRAMUSCULAR | Status: AC
  Administered 2013-12-01: 10 mL
  Filled 2013-12-01: qty 20

## 2013-12-01 NOTE — Discharge Instructions (Signed)
Return to the emergency room with worsening of symptoms, new symptoms or with symptoms that are concerning, fevers, nausea, vomiting, reoccurrence of abscess, increased swelling, pain, redness or purulent discharge.  Please take all of your antibiotics until finished!   You may develop abdominal discomfort or diarrhea from the antibiotic.  You may help offset this with probiotics which you can buy or get in yogurt. Do not eat  or take the probiotics until 2 hours after your antibiotic.    Abscess Care After An abscess (also called a boil or furuncle) is an infected area that contains a collection of pus. Signs and symptoms of an abscess include pain, tenderness, redness, or hardness, or you may feel a moveable soft area under your skin. An abscess can occur anywhere in the body. The infection may spread to surrounding tissues causing cellulitis. A cut (incision) by the surgeon was made over your abscess and the pus was drained out. Gauze may have been packed into the space to provide a drain that will allow the cavity to heal from the inside outwards. The boil may be painful for 5 to 7 days. Most people with a boil do not have high fevers. Your abscess, if seen early, may not have localized, and may not have been lanced. If not, another appointment may be required for this if it does not get better on its own or with medications. HOME CARE INSTRUCTIONS   Only take over-the-counter or prescription medicines for pain, discomfort, or fever as directed by your caregiver.  When you bathe, soak and then remove gauze or iodoform packs at least daily or as directed by your caregiver. You may then wash the wound gently with mild soapy water. Repack with gauze or do as your caregiver directs. SEEK IMMEDIATE MEDICAL CARE IF:   You develop increased pain, swelling, redness, drainage, or bleeding in the wound site.  You develop signs of generalized infection including muscle aches, chills, fever, or a general ill  feeling.  An oral temperature above 102 F (38.9 C) develops, not controlled by medication. See your caregiver for a recheck if you develop any of the symptoms described above. If medications (antibiotics) were prescribed, take them as directed. Document Released: 09/01/2004 Document Revised: 05/08/2011 Document Reviewed: 04/29/2007 [facility] Patient Information 2015 [place], [place]. This information is not intended to replace advice given to you by your health care provider. Make sure you discuss any questions you have with your health care provider.

## 2013-12-01 NOTE — ED Notes (Signed)
Pt. reports abscess at left /upper inner thigh onset yesterday with no drainage and left upper back muscle ache onset 2 days ago , denies injury or fall.

## 2013-12-01 NOTE — ED Provider Notes (Signed)
CSN: [id number]     Arrival date & time 12/01/13  2039 History   First MD Initiated Contact with Patient 12/01/13 2220     Chief Complaint  Patient presents with  . Abscess  . Back Pain     (Consider location/radiation/quality/duration/timing/severity/associated sxs/prior Treatment) HPI Katie Benson is a 33 y.o. female with PMH of neck injury presenting with abscess to left inguinal fold onset yesterday. Patient with abscess in same location in July presented here for drainage. Patient reports full resolution. These abscesses are the only two she has had. No history of DM or other immunosuppression. Patient does not remember last tetanus. Left upper back muscle soreness. Onset 2 days ago. Patient with prior neck injury many years ago. Pain worse with ROM to right side. No new injury.    Past Medical History  Diagnosis Date  . Neck injury    Past Surgical History  Procedure Laterality Date  . Cesarean section     Family History  Problem Relation Age of Onset  . Diabetes Sister   . Cancer Maternal Grandmother     ovarian   History  Substance Use Topics  . Smoking status: Current Every Day Smoker -- 0.25 packs/day for 14 years    Types: Cigarettes  . Smokeless tobacco: Not on file  . Alcohol Use: Yes     Comment: occassionally on weekends   OB History   Grav Para Term Preterm Abortions TAB SAB Ect Mult Living   2 1 1  1     1      Review of Systems  Constitutional: Negative for fever and chills.  HENT: Negative for congestion and rhinorrhea.   Eyes: Negative for visual disturbance.  Respiratory: Negative for cough and shortness of breath.   Cardiovascular: Negative for chest pain and palpitations.  Gastrointestinal: Negative for nausea, vomiting and diarrhea.  Genitourinary: Negative for dysuria and hematuria.  Musculoskeletal: Negative for back pain and gait problem.  Skin: Negative for rash and wound.  Neurological: Negative for weakness and headaches.       Allergies  Pork-derived products  Home Medications   Prior to Admission medications   Medication Sig Start Date End Date Taking? Authorizing Provider  naproxen sodium (ANAPROX) 220 MG tablet Take 220 mg by mouth daily as needed (for pain).   Yes Historical Provider, MD   BP 110/89  Pulse 104  Temp(Src) 99.3 F (37.4 C) (Oral)  Resp 19  Ht 5\' 3"  (1.6 m)  Wt 125 lb (56.7 kg)  BMI 22.15 kg/m2  SpO2 100%  LMP 11/30/2013 Physical Exam  Nursing note and vitals reviewed. Constitutional: She appears well-developed and well-nourished. No distress.  HENT:  Head: Normocephalic and atraumatic.  Eyes: Conjunctivae are normal. Right eye exhibits no discharge. Left eye exhibits no discharge.  Pulmonary/Chest: Effort normal. No respiratory distress.  Musculoskeletal:  FROM of left shoulder without pain. No warmth, erythema, edema of joint. Point tenderness to left trapezius with muscle tightness.  Neurological: She is alert. Coordination normal.  Skin: She is not diaphoretic.  3 cm area of fluctuance to left inguinal fold with mild signs of cellulitis with induration. No surrounding erythema, warmth.    ED Course  Procedures (including critical care time) Labs Review Labs Reviewed - No data to display  Imaging Review No results found.   EKG Interpretation None     INCISION AND DRAINAGE Performed by: Pura Spice Consent: Verbal consent obtained. Risks and benefits: risks, benefits and alternatives were discussed Type: abscess  Body area: left inguinal fold  Anesthesia: local infiltration  Incision was made with a scalpel.  Local anesthetic: lidocaine 2% with epinephrine  Anesthetic total: 2 ml  Complexity: complex Blunt dissection to break up loculations  Drainage: purulent  Drainage amount: 10cc  Packing material: none  Patient tolerance: Patient tolerated the procedure well with no immediate complications.     Meds given in  ED:  Medications  lidocaine-EPINEPHrine (XYLOCAINE W/EPI) 2 %-1:200000 (PF) injection 10 mL (10 mLs Infiltration Given 12/01/13 2254)  Tdap (BOOSTRIX) injection 0.5 mL (0.5 mLs Intramuscular Given 12/01/13 2354)    New Prescriptions   No medications on file      MDM   Final diagnoses:  Abscess of left groin  Left shoulder pain   Patient with skin abscess amenable to incision and drainage.  Abscess without packing or drain.  Encouraged home warm soaks and flushing.  Mild signs of cellulitis in surrounding skin.  Will d/c to home.  Antibiotic therapy due to recurrent nature and the abscess location. Tetanus updated today. Patient with point tenderness to trapezius. Patient had same issue in July. Pain is not worse. Flexeril prescribed. Sedation and driving precautions provided.   Discussed return precautions with patient. Discussed all results and patient verbalizes understanding and agrees with plan.      Pura Spice, PA-C 12/02/13 [place], PA-C 12/02/13 [phone]

## 2013-12-02 MED ORDER — CYCLOBENZAPRINE HCL 10 MG PO TABS: 10.0000 mg | ORAL_TABLET | Freq: Two times a day (BID) | ORAL | Status: DC | PRN

## 2013-12-02 MED ORDER — CEPHALEXIN 500 MG PO CAPS: 500.0000 mg | ORAL_CAPSULE | Freq: Four times a day (QID) | ORAL | Status: DC

## 2013-12-02 NOTE — ED Provider Notes (Signed)
Medical screening examination/treatment/procedure(s) were performed by non-physician practitioner and as supervising physician I was immediately available for consultation/collaboration.   EKG Interpretation None        Wandra Arthurs, MD 12/02/13 2232

## 2013-12-29 ENCOUNTER — Encounter (HOSPITAL_COMMUNITY): Payer: Self-pay | Admitting: Emergency Medicine

## 2014-02-23 ENCOUNTER — Encounter (HOSPITAL_COMMUNITY): Payer: Self-pay | Admitting: Family Medicine

## 2014-02-23 ENCOUNTER — Emergency Department (HOSPITAL_COMMUNITY)
Admission: EM | Admit: 2014-02-23 | Discharge: 2014-02-23 | Disposition: A | Discharge status: Home / Self Care | Payer: Medicaid Other | Attending: Emergency Medicine | Admitting: Emergency Medicine

## 2014-02-23 DIAGNOSIS — Z720 Tobacco use: Secondary | ICD-10-CM | POA: Insufficient documentation

## 2014-02-23 DIAGNOSIS — L02214 Cutaneous abscess of groin: Secondary | ICD-10-CM | POA: Insufficient documentation

## 2014-02-23 DIAGNOSIS — L0291 Cutaneous abscess, unspecified: Secondary | ICD-10-CM

## 2014-02-23 MED ORDER — CEPHALEXIN 500 MG PO CAPS: 500.0000 mg | ORAL_CAPSULE | Freq: Four times a day (QID) | ORAL | Status: DC

## 2014-02-23 MED ORDER — HYDROCODONE-ACETAMINOPHEN 5-325 MG PO TABS: 1.0000 | ORAL_TABLET | ORAL | Status: DC | PRN

## 2014-02-23 MED ORDER — LIDOCAINE-EPINEPHRINE (PF) 2 %-1:200000 IJ SOLN
10.0000 mL | Freq: Once | INTRAMUSCULAR | Status: AC
  Administered 2014-02-23: 10 mL
  Filled 2014-02-23: qty 20

## 2014-02-23 MED ORDER — SODIUM BICARBONATE 4 % IV SOLN
5.0000 mL | Freq: Once | INTRAVENOUS | Status: AC
  Administered 2014-02-23: 5 mL via SUBCUTANEOUS
  Filled 2014-02-23: qty 5

## 2014-02-23 NOTE — Discharge Instructions (Signed)

## 2014-02-23 NOTE — ED Provider Notes (Signed)
CSN: [id number]     Arrival date & time 02/23/14  1230 History  This chart was scribed for Linus Mako, PA-C, working with Nat Christen, MD by Starleen Arms, ED Scribe. This patient was seen in room TR06C/TR06C and the patient's care was started at 5:36 PM.    Chief Complaint  Patient presents with  . Abscess   The history is provided by the patient. No language interpreter was used.   HPI Comments: Katie Benson is a 33 y.o. female who presents to the Emergency Department complaining of a gradually worsening area of swelling in her left groin onset 3-4 days ago   She reports touch aggravates the pain.  She has a previous history of abscess, having 3 this year in today's area of complaint.  She was prescribed antibiotics once for these previous abscesses.   Patient denies other complaints.  Patient also reports painful urination.  Patient denies increased frequency, dysuria, fevers, abdominal pain, weakness. She reports she has been otherwise feeling fine.  Past Medical History  Diagnosis Date  . Neck injury    Past Surgical History  Procedure Laterality Date  . Cesarean section     Family History  Problem Relation Age of Onset  . Diabetes Sister   . Cancer Maternal Grandmother     ovarian   History  Substance Use Topics  . Smoking status: Current Every Day Smoker -- 0.25 packs/day for 14 years    Types: Cigarettes  . Smokeless tobacco: Not on file  . Alcohol Use: Yes     Comment: occassionally on weekends   OB History    Gravida Para Term Preterm AB TAB SAB Ectopic Multiple Living   2 1 1  1     1      Review of Systems  All other systems reviewed and are negative.     Allergies  Pork-derived products  Home Medications   Prior to Admission medications   Medication Sig Start Date End Date Taking? Authorizing Provider  cephALEXin (KEFLEX) 500 MG capsule Take 1 capsule (500 mg total) by mouth 4 (four) times daily. 02/23/14   Arlene Genova Marilu Favre, PA-C   cyclobenzaprine (FLEXERIL) 10 MG tablet Take 1 tablet (10 mg total) by mouth 2 (two) times daily as needed for muscle spasms. 12/02/13   Pura Spice, PA-C  HYDROcodone-acetaminophen (NORCO/VICODIN) 5-325 MG per tablet Take 1-2 tablets by mouth every 4 (four) hours as needed. 02/23/14   Linus Mako, PA-C  naproxen sodium (ANAPROX) 220 MG tablet Take 220 mg by mouth daily as needed (for pain).    Historical Provider, MD   BP 109/70 mmHg  Pulse 86  Temp(Src) 98.6 F (37 C) (Oral)  Resp 16  Ht 5\' 3"  (1.6 m)  Wt 128 lb (58.06 kg)  BMI 22.68 kg/m2  SpO2 100%  LMP 02/18/2014 Physical Exam  Constitutional: She is oriented to person, place, and time. She appears well-developed and well-nourished. No distress.  HENT:  Head: Normocephalic and atraumatic.  Eyes: Conjunctivae and EOM are normal.  Neck: Neck supple. No tracheal deviation present.  Cardiovascular: Normal rate.   Pulmonary/Chest: Effort normal. No respiratory distress.  Genitourinary:     3 cm diameter abscess to proximal left groin. Associated cellulitis. No involvement of structures within the labia majora.   Musculoskeletal: Normal range of motion.  Neurological: She is alert and oriented to person, place, and time.  Skin: Skin is warm and dry.  Psychiatric: She has a normal mood and affect.  Her behavior is normal.  Nursing note and vitals reviewed.   ED Course  Procedures (including critical care time)  DIAGNOSTIC STUDIES: Oxygen Saturation is 100% on RA, normal by my interpretation.    COORDINATION OF CARE:  5:40 PM Informed patient of plan to perform I&D of abscess.  Will prescribe antibiotics and pain medication.  Will provide referral to dermatologist.  Patient acknowledges and agrees with plan.    INCISION AND DRAINAGE PROCEDURE NOTE: Patient identification was confirmed and verbal consent was obtained. This procedure was performed by Linus Mako, PA-C, at 5:43 PM. Site: left inguinal  region Sterile procedures observed: yes Needle size: 22 gauge Anesthetic used (type and amt): 2 cc Blade size: 18 Drainage: moderate Complexity: Complex Packing used: none Site anesthetized, incision made over site, wound drained and explored loculations, rinsed with copious amounts of normal saline, wound packed with sterile gauze, covered with dry, sterile dressing.  Pt tolerated procedure well without complications.  Instructions for care discussed verbally and pt provided with additional written instructions for homecare and f/u.   Labs Review Labs Reviewed - No data to display  Imaging Review No results found.   EKG Interpretation None      MDM   Final diagnoses:  Abscess    Abx, pain meds, referral to derm  33 y.o.Katie Benson's evaluation in the Emergency Department is complete. It has been determined that no acute conditions requiring further emergency intervention are present at this time. The patient/guardian have been advised of the diagnosis and plan. We have discussed signs and symptoms that warrant return to the ED, such as changes or worsening in symptoms.  Vital signs are stable at discharge. Filed Vitals:   02/23/14 1300  BP: 109/70  Pulse: 86  Temp: 98.6 F (37 C)  Resp: 16    Patient/guardian has voiced understanding and agreed to follow-up with the PCP or specialist.  I personally performed the services described in this documentation, which was scribed in my presence. The recorded information has been reviewed and is accurate.    Linus Mako, PA-C 02/23/14 1756  Nat Christen, MD 02/24/14 [phone]

## 2014-02-23 NOTE — ED Notes (Signed)
Per pt sts abscess to groin area where her panty line is. Denies drainage. sts 3 days.

## 2014-05-13 ENCOUNTER — Telehealth (HOSPITAL_BASED_OUTPATIENT_CLINIC_OR_DEPARTMENT_OTHER): Payer: Self-pay | Admitting: Emergency Medicine

## 2014-07-09 ENCOUNTER — Encounter (HOSPITAL_COMMUNITY): Payer: Self-pay | Admitting: *Deleted

## 2014-07-09 ENCOUNTER — Emergency Department (HOSPITAL_COMMUNITY)
Admission: EM | Admit: 2014-07-09 | Discharge: 2014-07-10 | Disposition: A | Discharge status: Home / Self Care | Payer: Medicaid Other | Attending: Emergency Medicine | Admitting: Emergency Medicine

## 2014-07-09 DIAGNOSIS — Z720 Tobacco use: Secondary | ICD-10-CM | POA: Diagnosis not present

## 2014-07-09 DIAGNOSIS — Z792 Long term (current) use of antibiotics: Secondary | ICD-10-CM | POA: Insufficient documentation

## 2014-07-09 DIAGNOSIS — Z87828 Personal history of other (healed) physical injury and trauma: Secondary | ICD-10-CM | POA: Diagnosis not present

## 2014-07-09 DIAGNOSIS — L02214 Cutaneous abscess of groin: Secondary | ICD-10-CM

## 2014-07-09 MED ORDER — LIDOCAINE-EPINEPHRINE (PF) 2 %-1:200000 IJ SOLN
10.0000 mL | Freq: Once | INTRAMUSCULAR | Status: AC
  Administered 2014-07-10: 10 mL via INTRADERMAL
  Filled 2014-07-09: qty 20

## 2014-07-09 MED ORDER — CEPHALEXIN 500 MG PO CAPS: 1000.0000 mg | ORAL_CAPSULE | Freq: Two times a day (BID) | ORAL | Status: DC

## 2014-07-09 MED ORDER — NAPROXEN SODIUM 220 MG PO TABS: 220.0000 mg | ORAL_TABLET | Freq: Every day | ORAL | Status: DC | PRN

## 2014-07-09 NOTE — ED Provider Notes (Signed)
CSN: [id number]     Arrival date & time 07/09/14  2257 History   First MD Initiated Contact with Patient 07/09/14 2322     Chief Complaint  Patient presents with  . Abscess     (Consider location/radiation/quality/duration/timing/severity/associated sxs/prior Treatment) HPI   34 year old female presents for evaluation of a boil on her left groin. Patient states she has had abscess to the affected area twice in the past requiring I&D. She noticed a small bump yesterday which has got progressively larger today and more painful to the touch. Pain is described as sharp and throbbing worsening with palpation. She tries over-the-counter medication with minimal relief. No associated fever, chills, vaginal bleeding, vaginal discharge, dysuria. Denies any recent injury. Denies shaving. Patient is a smoker. She is up-to-date with her tetanus. She did attempt to follow-up with a dermatologist however dermatologist does not accept Medicaid therefore she was not seen.  Past Medical History  Diagnosis Date  . Neck injury    Past Surgical History  Procedure Laterality Date  . Cesarean section     Family History  Problem Relation Age of Onset  . Diabetes Sister   . Cancer Maternal Grandmother     ovarian   History  Substance Use Topics  . Smoking status: Current Every Day Smoker -- 0.25 packs/day for 14 years    Types: Cigarettes  . Smokeless tobacco: Not on file  . Alcohol Use: Yes     Comment: occassionally on weekends   OB History    Gravida Para Term Preterm AB TAB SAB Ectopic Multiple Living   2 1 1  1     1      Review of Systems  Constitutional: Negative for fever.  Genitourinary: Negative for dysuria.  Skin: Positive for rash.      Allergies  Pork-derived products  Home Medications   Prior to Admission medications   Medication Sig Start Date End Date Taking? Authorizing Provider  cephALEXin (KEFLEX) 500 MG capsule Take 1 capsule (500 mg total) by mouth 4 (four) times  daily. 02/23/14   Tiffany Carlota Raspberry, PA-C  cyclobenzaprine (FLEXERIL) 10 MG tablet Take 1 tablet (10 mg total) by mouth 2 (two) times daily as needed for muscle spasms. 12/02/13   Al Corpus, PA-C  HYDROcodone-acetaminophen (NORCO/VICODIN) 5-325 MG per tablet Take 1-2 tablets by mouth every 4 (four) hours as needed. 02/23/14   Delos Haring, PA-C  naproxen sodium (ANAPROX) 220 MG tablet Take 220 mg by mouth daily as needed (for pain).    Historical Provider, MD   BP 108/58 mmHg  Pulse 76  Temp(Src) 98.2 F (36.8 C) (Oral)  Resp 22  Wt 125 lb 1 oz (56.728 kg)  SpO2 99%  LMP 07/07/2014 Physical Exam  Constitutional: She appears well-developed and well-nourished. No distress.  HENT:  Head: Atraumatic.  Eyes: Conjunctivae are normal.  Neck: Neck supple.  Genitourinary:  Chaperone present: An area of induration and mild fluctuance noted to L groin with surrounding skin erythema, exquisitely tender to palpation.  No vaginal involvement.   Neurological: She is alert.  Skin: Rash noted.  Psychiatric: She has a normal mood and affect.  Nursing note and vitals reviewed.   ED Course  Procedures (including critical care time)  Patient with early forming abscess noted to her left groin. Will attempt I&D.    INCISION AND DRAINAGE Performed by: Domenic Moras Consent: Verbal consent obtained. Risks and benefits: risks, benefits and alternatives were discussed Type: abscess  Body area: L groin  Anesthesia: local  infiltration  Incision was made with a scalpel.  Local anesthetic: lidocaine 2% w epinephrine  Anesthetic total: 2 ml  Complexity: complex Blunt dissection to break up loculations  Drainage: purulent  Drainage amount: moderate  Packing material: none  Patient tolerance: Patient tolerated the procedure well with no immediate complications.     Labs Review Labs Reviewed - No data to display  Imaging Review No results found.   EKG Interpretation None       MDM   Final diagnoses:  Abscess of left groin    BP 115/72 mmHg  Pulse 61  Temp(Src) 98.2 F (36.8 C) (Oral)  Resp 20  Wt 125 lb 1 oz (56.728 kg)  SpO2 100%  LMP 07/07/2014     Domenic Moras, PA-C 07/09/14 2354  Julianne Rice, MD 07/10/14 [phone]

## 2014-07-09 NOTE — Discharge Instructions (Signed)

## 2014-07-09 NOTE — ED Notes (Signed)
Pt states that she has an abscess between her legs (on her panty line) starting today. States that she was referred to a dermatologist because of hx of same but the dermatologist does not accept medicaid.

## 2015-10-14 ENCOUNTER — Encounter (HOSPITAL_COMMUNITY): Payer: Self-pay | Admitting: Emergency Medicine

## 2015-10-14 DIAGNOSIS — L02215 Cutaneous abscess of perineum: Secondary | ICD-10-CM | POA: Insufficient documentation

## 2015-10-14 DIAGNOSIS — F17210 Nicotine dependence, cigarettes, uncomplicated: Secondary | ICD-10-CM | POA: Diagnosis not present

## 2015-10-14 NOTE — ED Triage Notes (Signed)
Pt. reports abscess at left labia increasing in size with no drainage onset last week , denies fever or chills .

## 2015-10-15 ENCOUNTER — Emergency Department (HOSPITAL_COMMUNITY)
Admission: EM | Admit: 2015-10-15 | Discharge: 2015-10-15 | Disposition: A | Discharge status: Home / Self Care | Payer: Medicaid Other | Attending: Emergency Medicine | Admitting: Emergency Medicine

## 2015-10-15 DIAGNOSIS — L0291 Cutaneous abscess, unspecified: Secondary | ICD-10-CM

## 2015-10-15 MED ORDER — CEPHALEXIN 500 MG PO CAPS: 500.0000 mg | ORAL_CAPSULE | Freq: Four times a day (QID) | ORAL | 0 refills | Status: DC

## 2015-10-15 MED ORDER — OXYCODONE-ACETAMINOPHEN 5-325 MG PO TABS
1.0000 | ORAL_TABLET | ORAL | 0 refills | Status: DC | PRN
Start: 2015-10-15 — End: 2017-08-20

## 2015-10-15 MED ORDER — OXYCODONE-ACETAMINOPHEN 5-325 MG PO TABS
1.0000 | ORAL_TABLET | Freq: Once | ORAL | Status: AC
  Administered 2015-10-15: 1 via ORAL
  Filled 2015-10-15: qty 1

## 2015-10-15 MED ORDER — LIDOCAINE HCL (PF) 1 % IJ SOLN
5.0000 mL | Freq: Once | INTRAMUSCULAR | Status: AC
  Administered 2015-10-15: 5 mL
  Filled 2015-10-15: qty 5

## 2015-10-15 NOTE — ED Provider Notes (Signed)
[facility] DEPT Provider Note   CSN: [id number] Arrival date & time: 10/14/15  1948     History   Chief Complaint Chief Complaint  Patient presents with  . Abscess    HPI Katie Benson is a 35 y.o. female.  Patient with history of folliculitis and previous abscess presents with symptoms of abscess to external vagina. No fever. No pelvic pain.    The history is provided by the patient. No language interpreter was used.  Abscess  Location:  Pelvis Pelvic abscess location:  Vagina Abscess quality: fluctuance, painful and redness   Abscess quality: not draining   Red streaking: no   Duration:  2 days Progression:  Worsening Associated symptoms: no fever     Past Medical History:  Diagnosis Date  . Neck injury     Patient Active Problem List   Diagnosis Date Noted  . Breast lump in female 03/08/2012    Past Surgical History:  Procedure Laterality Date  . CESAREAN SECTION      OB History    Gravida Para Term Preterm AB Living   2 1 1   1 1    SAB TAB Ectopic Multiple Live Births                   Home Medications    Prior to Admission medications   Medication Sig Start Date End Date Taking? Authorizing Provider  cephALEXin (KEFLEX) 500 MG capsule Take 2 capsules (1,000 mg total) by mouth 2 (two) times daily. 07/09/14   Domenic Moras, PA-C  cyclobenzaprine (FLEXERIL) 10 MG tablet Take 1 tablet (10 mg total) by mouth 2 (two) times daily as needed for muscle spasms. 12/02/13   Al Corpus, PA-C  HYDROcodone-acetaminophen (NORCO/VICODIN) 5-325 MG per tablet Take 1-2 tablets by mouth every 4 (four) hours as needed. 02/23/14   Delos Haring, PA-C  naproxen sodium (ANAPROX) 220 MG tablet Take 1 tablet (220 mg total) by mouth daily as needed (for pain). 07/09/14   Domenic Moras, PA-C    Family History Family History  Problem Relation Age of Onset  . Diabetes Sister   . Cancer Maternal Grandmother     ovarian    Social History Social History  Substance  Use Topics  . Smoking status: Current Every Day Smoker    Packs/day: 0.25    Years: 14.00    Types: Cigarettes  . Smokeless tobacco: Not on file  . Alcohol use Yes     Comment: occassionally on weekends     Allergies   Pork-derived products   Review of Systems Review of Systems  Constitutional: Negative for fever.  Gastrointestinal: Negative for abdominal pain.  Genitourinary: Negative for pelvic pain.  Musculoskeletal: Negative for myalgias.  Skin: Positive for color change.       See HPI.     Physical Exam Updated Vital Signs BP 107/70   Pulse 67   Temp 98.3 F (36.8 C) (Oral)   Resp 16   Ht 5\' 3"  (1.6 m)   Wt 56.2 kg   LMP 10/07/2015 (Approximate)   SpO2 100%   BMI 21.97 kg/m   Physical Exam  Constitutional: She appears well-developed and well-nourished.  Musculoskeletal: Normal range of motion.  Skin: Skin is warm and dry. There is erythema.  Red, exquisitely tender area to left pubic mons without active drainage.      ED Treatments / Results  Labs (all labs ordered are listed, but only abnormal results are displayed) Labs Reviewed - No data to  display  EKG  EKG Interpretation None       Radiology No results found.  Procedures Procedures (including critical care time) INCISION AND DRAINAGE Performed by: Charlann Lange A Consent: Verbal consent obtained. Risks and benefits: risks, benefits and alternatives were discussed Type: abscess  Body area: pubic mons  Anesthesia: local infiltration  Incision was made with a scalpel.  Local anesthetic: lidocaine 1% w/o epinephrine  Anesthetic total: 2 ml  Complexity: complex Blunt dissection to break up loculations  Drainage: purulent  Drainage amount: moderate, purulent  Packing material: none  Patient tolerance: Patient tolerated the procedure well with no immediate complications.    Medications Ordered in ED Medications  lidocaine (PF) (XYLOCAINE) 1 % injection 5 mL (not  administered)  oxyCODONE-acetaminophen (PERCOCET/ROXICET) 5-325 MG per tablet 1 tablet (1 tablet Oral Given 10/15/15 0425)     Initial Impression / Assessment and Plan / ED Course  I have reviewed the triage vital signs and the nursing notes.  Pertinent labs & imaging results that were available during my care of the patient were reviewed by me and considered in my medical decision making (see chart for details).  Clinical Course    Cutaneous abscess to pubic mons requiring I&D as per procedure note.   Final Clinical Impressions(s) / ED Diagnoses   Final diagnoses:  None   1. Cutaneous abscess  New Prescriptions New Prescriptions   No medications on file     Charlann Lange, PA-C 10/15/15 0500    Ripley Fraise, MD 10/15/15 [phone]

## 2015-10-15 NOTE — ED Notes (Signed)
PA at bedside performing I&D procedure

## 2015-10-15 NOTE — ED Notes (Signed)
Pt unable to sign d/t signature pad malfunction; pt verbalized understanding of discharge instructions and prescriptions; pt ambulatory to lobby with steady gait noted

## 2015-10-15 NOTE — ED Notes (Signed)
When patient was taken to room patient refused for vitals to be repeated. Also stated that she would not be sitting or lying down, stated that she would stand and wait for her treatment. Patient was given a gown for examination by MD.

## 2015-10-18 ENCOUNTER — Encounter (HOSPITAL_COMMUNITY): Payer: Self-pay

## 2015-10-18 ENCOUNTER — Emergency Department (HOSPITAL_COMMUNITY)
Admission: EM | Admit: 2015-10-18 | Discharge: 2015-10-18 | Disposition: A | Discharge status: Home / Self Care | Payer: Medicaid Other | Attending: Emergency Medicine | Admitting: Emergency Medicine

## 2015-10-18 DIAGNOSIS — F17210 Nicotine dependence, cigarettes, uncomplicated: Secondary | ICD-10-CM | POA: Insufficient documentation

## 2015-10-18 DIAGNOSIS — L0291 Cutaneous abscess, unspecified: Secondary | ICD-10-CM

## 2015-10-18 DIAGNOSIS — L02416 Cutaneous abscess of left lower limb: Secondary | ICD-10-CM | POA: Diagnosis present

## 2015-10-18 DIAGNOSIS — Z79899 Other long term (current) drug therapy: Secondary | ICD-10-CM | POA: Insufficient documentation

## 2015-10-18 MED ORDER — HYDROCODONE-ACETAMINOPHEN 5-325 MG PO TABS: 1.0000 | ORAL_TABLET | Freq: Four times a day (QID) | ORAL | 0 refills | Status: DC | PRN

## 2015-10-18 MED ORDER — SULFAMETHOXAZOLE-TRIMETHOPRIM 800-160 MG PO TABS
1.0000 | ORAL_TABLET | Freq: Two times a day (BID) | ORAL | 0 refills | Status: AC
Start: 2015-10-18 — End: 2015-10-25

## 2015-10-18 MED ORDER — LIDOCAINE HCL 2 % IJ SOLN
10.0000 mL | Freq: Once | INTRAMUSCULAR | Status: AC
  Administered 2015-10-18: 200 mg
  Filled 2015-10-18: qty 20

## 2015-10-18 NOTE — ED Provider Notes (Signed)
[facility] DEPT Provider Note   CSN: [id number] Arrival date & time: 10/18/15  2151     History   Chief Complaint Chief Complaint  Patient presents with  . Wound Check    HPI Katie Benson is a 35 y.o. female.  HPI   Patient to the ER with complaints of pain to her left hip. She had a labial abscess drained on 10/15/2015 and this one started shortly after. She is on keflex x 4 days. She has not had any abdominal pain, fever, N/V/D. She denies having any complication to her prior I&D. She requests that it be drained because it is causing her pain. She says it is small and tender.  Past Medical History:  Diagnosis Date  . Neck injury     Patient Active Problem List   Diagnosis Date Noted  . Breast lump in female 03/08/2012    Past Surgical History:  Procedure Laterality Date  . CESAREAN SECTION      OB History    Gravida Para Term Preterm AB Living   2 1 1   1 1    SAB TAB Ectopic Multiple Live Births                   Home Medications    Prior to Admission medications   Medication Sig Start Date End Date Taking? Authorizing Provider  cephALEXin (KEFLEX) 500 MG capsule Take 1 capsule (500 mg total) by mouth 4 (four) times daily. 10/15/15   Charlann Lange, PA-C  cyclobenzaprine (FLEXERIL) 10 MG tablet Take 1 tablet (10 mg total) by mouth 2 (two) times daily as needed for muscle spasms. 12/02/13   Al Corpus, PA-C  HYDROcodone-acetaminophen (NORCO/VICODIN) 5-325 MG per tablet Take 1-2 tablets by mouth every 4 (four) hours as needed. 02/23/14   Delos Haring, PA-C  HYDROcodone-acetaminophen (NORCO/VICODIN) 5-325 MG tablet Take 1 tablet by mouth every 6 (six) hours as needed. 10/18/15   Tashi Band Carlota Raspberry, PA-C  naproxen sodium (ANAPROX) 220 MG tablet Take 1 tablet (220 mg total) by mouth daily as needed (for pain). 07/09/14   Domenic Moras, PA-C  oxyCODONE-acetaminophen (PERCOCET/ROXICET) 5-325 MG tablet Take 1 tablet by mouth every 4 (four) hours as needed for severe  pain. 10/15/15   Charlann Lange, PA-C  sulfamethoxazole-trimethoprim (BACTRIM DS,SEPTRA DS) 800-160 MG tablet Take 1 tablet by mouth 2 (two) times daily. 10/18/15 10/25/15  Delos Haring, PA-C    Family History Family History  Problem Relation Age of Onset  . Diabetes Sister   . Cancer Maternal Grandmother     ovarian    Social History Social History  Substance Use Topics  . Smoking status: Current Every Day Smoker    Packs/day: 0.25    Years: 14.00    Types: Cigarettes  . Smokeless tobacco: Never Used  . Alcohol use Yes     Comment: occassionally on weekends     Allergies   Pork-derived products   Review of Systems Review of Systems   Review of Systems All other systems negative except as documented in the HPI. All pertinent positives and negatives as reviewed in the HPI.   Physical Exam Updated Vital Signs BP 117/71 (BP Location: Right Arm)   Pulse 80   Temp 98.3 F (36.8 C) (Oral)   Resp 17   LMP 10/07/2015 (Approximate)   SpO2 100%   Physical Exam  Constitutional: She appears well-developed and well-nourished.  HENT:  Head: Normocephalic and atraumatic.  Eyes: Conjunctivae are normal. Pupils are equal, round, and  reactive to light.  Neck: Trachea normal and full passive range of motion without pain.  Cardiovascular: Normal rate, regular rhythm and normal pulses.   Pulmonary/Chest: Effort normal. Chest wall is not dull to percussion. She exhibits no tenderness, no crepitus, no edema, no deformity and no retraction.  Abdominal: Soft. Normal appearance and bowel sounds are normal.    Genitourinary:  Genitourinary Comments: Old I&D site appreciated and according to old charts and patient history it is significantly improved, only mild swellin  Musculoskeletal: Normal range of motion.  Lymphadenopathy:       Head (right side): No submental, no submandibular, no tonsillar, no preauricular, no posterior auricular and no occipital adenopathy present.       Head  (left side): No submental, no submandibular, no tonsillar, no preauricular, no posterior auricular and no occipital adenopathy present.    She has no cervical adenopathy.    She has no axillary adenopathy.  Neurological: She is alert. She has normal strength.  Skin: Skin is warm, dry and intact.  Psychiatric: She has a normal mood and affect. Her speech is normal and behavior is normal. Judgment and thought content normal. Cognition and memory are normal.     ED Treatments / Results  Labs (all labs ordered are listed, but only abnormal results are displayed) Labs Reviewed - No data to display  EKG  EKG Interpretation None       Radiology No results found.  Procedures Procedures (including critical care time)  Medications Ordered in ED Medications  lidocaine (XYLOCAINE) 2 % (with pres) injection 200 mg (200 mg Other Given 10/18/15 2246)     Initial Impression / Assessment and Plan / ED Course  I have reviewed the triage vital signs and the nursing notes.  Pertinent labs & imaging results that were available during my care of the patient were reviewed by me and considered in my medical decision making (see chart for details).  Clinical Course    INCISION AND DRAINAGE Performed by: Linus Mako Consent: Verbal consent obtained. Risks and benefits: risks, benefits and alternatives were discussed Type: abscess  Body area: left hip  Anesthesia: local infiltration  Incision was made with a scalpel.  Local anesthetic: lidocaine 2 % wo epinephrine  Anesthetic total: 2 ml  Complexity: complex Blunt dissection to break up loculations  Drainage: purulent  Drainage amount: moderate  Packing material: None  Patient tolerance: Patient tolerated the procedure well with no immediate complications.   pt has been on Keflex but still has some firmness and developed a second abscess. Will add on Bactrim and have her complete Keflex.    Final Clinical  Impressions(s) / ED Diagnoses   Final diagnoses:  Abscess    New Prescriptions New Prescriptions   HYDROCODONE-ACETAMINOPHEN (NORCO/VICODIN) 5-325 MG TABLET    Take 1 tablet by mouth every 6 (six) hours as needed.   SULFAMETHOXAZOLE-TRIMETHOPRIM (BACTRIM DS,SEPTRA DS) 800-160 MG TABLET    Take 1 tablet by mouth 2 (two) times daily.     Delos Haring, PA-C 10/18/15 2318    Margette Fast, MD 10/19/15 [phone]

## 2015-10-18 NOTE — ED Triage Notes (Signed)
Pt states here for wound recheck. Pt states seen here recently, told to come back here for follow up. Pt denies any bleeding or discharge.

## 2016-03-28 ENCOUNTER — Emergency Department (HOSPITAL_COMMUNITY): Payer: Self-pay

## 2016-03-28 ENCOUNTER — Encounter (HOSPITAL_COMMUNITY): Payer: Self-pay | Admitting: Emergency Medicine

## 2016-03-28 ENCOUNTER — Emergency Department (HOSPITAL_COMMUNITY)
Admission: EM | Admit: 2016-03-28 | Discharge: 2016-03-28 | Disposition: A | Discharge status: Home / Self Care | Payer: Self-pay | Attending: Emergency Medicine | Admitting: Emergency Medicine

## 2016-03-28 DIAGNOSIS — Z79899 Other long term (current) drug therapy: Secondary | ICD-10-CM | POA: Insufficient documentation

## 2016-03-28 DIAGNOSIS — M25562 Pain in left knee: Secondary | ICD-10-CM | POA: Insufficient documentation

## 2016-03-28 DIAGNOSIS — F17210 Nicotine dependence, cigarettes, uncomplicated: Secondary | ICD-10-CM | POA: Insufficient documentation

## 2016-03-28 MED ORDER — NAPROXEN 500 MG PO TABS: 500.0000 mg | ORAL_TABLET | Freq: Two times a day (BID) | ORAL | 0 refills | Status: DC

## 2016-03-28 MED ORDER — OXYCODONE-ACETAMINOPHEN 5-325 MG PO TABS
1.0000 | ORAL_TABLET | Freq: Once | ORAL | Status: AC
  Administered 2016-03-28: 1 via ORAL
  Filled 2016-03-28: qty 1
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
## 2016-03-28 NOTE — Progress Notes (Signed)
Orthopedic Tech Progress Note Patient Details:  Katie Benson [birth year] [id number]  Ortho Devices Type of Ortho Device: Crutches Ortho Device/Splint Interventions: Ordered, Adjustment   Braulio Bosch 03/28/2016, 4:54 PM

## 2016-03-28 NOTE — ED Notes (Signed)
PT made aware she cannot drive for the rest of th day
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
## 2016-03-28 NOTE — ED Triage Notes (Signed)
Left knee pain since yesterday has had pain in this before no injury

## 2016-03-28 NOTE — ED Notes (Signed)
Family at bedside. 

## 2016-03-28 NOTE — ED Notes (Signed)
ED out of adult size crutches. Ortho tech to bring appropriate size.

## 2017-04-21 ENCOUNTER — Encounter (HOSPITAL_COMMUNITY): Payer: Self-pay

## 2017-04-21 ENCOUNTER — Emergency Department (HOSPITAL_COMMUNITY)
Admission: EM | Admit: 2017-04-21 | Discharge: 2017-04-21 | Disposition: A | Discharge status: Home / Self Care | Payer: Self-pay | Attending: Emergency Medicine | Admitting: Emergency Medicine

## 2017-04-21 ENCOUNTER — Other Ambulatory Visit: Payer: Self-pay

## 2017-04-21 DIAGNOSIS — N760 Acute vaginitis: Secondary | ICD-10-CM | POA: Insufficient documentation

## 2017-04-21 DIAGNOSIS — F17210 Nicotine dependence, cigarettes, uncomplicated: Secondary | ICD-10-CM | POA: Insufficient documentation

## 2017-04-21 DIAGNOSIS — Z711 Person with feared health complaint in whom no diagnosis is made: Secondary | ICD-10-CM | POA: Insufficient documentation

## 2017-04-21 DIAGNOSIS — Z79899 Other long term (current) drug therapy: Secondary | ICD-10-CM | POA: Insufficient documentation

## 2017-04-21 DIAGNOSIS — B9689 Other specified bacterial agents as the cause of diseases classified elsewhere: Secondary | ICD-10-CM | POA: Insufficient documentation

## 2017-04-21 DIAGNOSIS — N898 Other specified noninflammatory disorders of vagina: Secondary | ICD-10-CM

## 2017-04-21 LAB — URINALYSIS, ROUTINE W REFLEX MICROSCOPIC
Bilirubin Urine: NEGATIVE
Glucose, UA: NEGATIVE mg/dL
Hgb urine dipstick: NEGATIVE
Ketones, ur: NEGATIVE mg/dL
Leukocytes, UA: NEGATIVE
Nitrite: NEGATIVE
Protein, ur: NEGATIVE mg/dL
Specific Gravity, Urine: 1.024 (ref 1.005–1.030)
pH: 6 (ref 5.0–8.0)

## 2017-04-21 LAB — WET PREP, GENITAL
Sperm: NONE SEEN
Trich, Wet Prep: NONE SEEN
Yeast Wet Prep HPF POC: NONE SEEN

## 2017-04-21 LAB — PREGNANCY, URINE: Preg Test, Ur: NEGATIVE

## 2017-04-21 MED ORDER — METRONIDAZOLE 500 MG PO TABS: 500.0000 mg | ORAL_TABLET | Freq: Two times a day (BID) | ORAL | 0 refills | Status: DC

## 2017-04-21 MED ORDER — CEFTRIAXONE SODIUM 250 MG IJ SOLR
250.0000 mg | Freq: Once | INTRAMUSCULAR | Status: AC
  Administered 2017-04-21: 250 mg via INTRAMUSCULAR
  Filled 2017-04-21: qty 250

## 2017-04-21 MED ORDER — LIDOCAINE HCL (PF) 1 % IJ SOLN
INTRAMUSCULAR | Status: AC
  Administered 2017-04-21: 5 mL
  Filled 2017-04-21: qty 5

## 2017-04-21 MED ORDER — AZITHROMYCIN 250 MG PO TABS
1000.0000 mg | ORAL_TABLET | Freq: Once | ORAL | Status: AC
  Administered 2017-04-21: 1000 mg via ORAL
  Filled 2017-04-21: qty 4

## 2017-04-21 NOTE — Discharge Instructions (Signed)
Take Flagyl twice daily for 1 week for bacterial vaginosis.  Do not drink alcohol while taking this medication.  You have been treated for gonorrhea and chlamydia today. You will be called in 3 days this days if any of your tests return positive. In that case, please make all of your sexual partners aware that they will need to be treated as well. Abstain from intercourse for one week until you have both been treated. Use condoms in the future to help prevent sexually transmitted disease and unwanted pregnancy. You can go to the health department in the future for free STD testing.

## 2017-04-21 NOTE — ED Provider Notes (Signed)
[facility] EMERGENCY DEPARTMENT Provider Note   CSN: [id number] Arrival date & time: 04/21/17  1153     History   Chief Complaint Chief Complaint  Patient presents with  . Vaginal Discharge    HPI Katie Benson is a 37 y.o. female who is previously healthy who presents with a one-week history of vaginal discharge.  She has had some associated, mild, intermittent left lower quadrant pain.  She denies any urinary symptoms.  She reports her boyfriend was recently diagnosed with gonorrhea.  She reports LMP 03/30/2017.  It was normal.  She denies any abnormal vaginal bleeding.  She also denies any fevers, chest pain, shortness of breath, nausea, vomiting.  Patient also reports history of a "hair bump" on her right labia.  She reports it is improving.  It did have drainage and has improved since then.  HPI  Past Medical History:  Diagnosis Date  . Neck injury     Patient Active Problem List   Diagnosis Date Noted  . Breast lump in female 03/08/2012    Past Surgical History:  Procedure Laterality Date  . CESAREAN SECTION      OB History    Gravida Para Term Preterm AB Living   2 1 1   1 1    SAB TAB Ectopic Multiple Live Births                   Home Medications    Prior to Admission medications   Medication Sig Start Date End Date Taking? Authorizing Provider  cephALEXin (KEFLEX) 500 MG capsule Take 1 capsule (500 mg total) by mouth 4 (four) times daily. 10/15/15   Charlann Lange, PA-C  cyclobenzaprine (FLEXERIL) 10 MG tablet Take 1 tablet (10 mg total) by mouth 2 (two) times daily as needed for muscle spasms. 12/02/13   Al Corpus, PA-C  HYDROcodone-acetaminophen (NORCO/VICODIN) 5-325 MG per tablet Take 1-2 tablets by mouth every 4 (four) hours as needed. 02/23/14   Delos Haring, PA-C  HYDROcodone-acetaminophen (NORCO/VICODIN) 5-325 MG tablet Take 1 tablet by mouth every 6 (six) hours as needed. 10/18/15   Delos Haring, PA-C  metroNIDAZOLE  (FLAGYL) 500 MG tablet Take 1 tablet (500 mg total) by mouth 2 (two) times daily. 04/21/17   Grizel Vesely, Bea Graff, PA-C  naproxen (NAPROSYN) 500 MG tablet Take 1 tablet (500 mg total) by mouth 2 (two) times daily. 03/28/16   Shary Decamp, PA-C  naproxen sodium (ANAPROX) 220 MG tablet Take 1 tablet (220 mg total) by mouth daily as needed (for pain). 07/09/14   Domenic Moras, PA-C  oxyCODONE-acetaminophen (PERCOCET/ROXICET) 5-325 MG tablet Take 1 tablet by mouth every 4 (four) hours as needed for severe pain. 10/15/15   Charlann Lange, PA-C    Family History Family History  Problem Relation Age of Onset  . Diabetes Sister   . Cancer Maternal Grandmother        ovarian    Social History Social History   Tobacco Use  . Smoking status: Current Every Day Smoker    Packs/day: 0.25    Years: 14.00    Pack years: 3.50    Types: Cigarettes  . Smokeless tobacco: Never Used  Substance Use Topics  . Alcohol use: Yes    Comment: occassionally on weekends  . Drug use: Yes    Types: Marijuana     Allergies   Pork-derived products   Review of Systems Review of Systems  Constitutional: Negative for chills and fever.  HENT: Negative for facial  swelling and sore throat.   Respiratory: Negative for shortness of breath.   Cardiovascular: Negative for chest pain.  Gastrointestinal: Positive for abdominal pain (intermittent, LLQ). Negative for nausea and vomiting.  Genitourinary: Positive for vaginal discharge. Negative for dysuria and vaginal bleeding.  Musculoskeletal: Negative for back pain.  Skin: Negative for rash and wound.  Neurological: Negative for headaches.  Psychiatric/Behavioral: The patient is not nervous/anxious.      Physical Exam Updated Vital Signs BP 116/68   Pulse (!) 56   Temp 98.4 F (36.9 C) (Oral)   Resp 16   Ht 5\' 3"  (1.6 m)   Wt 59 kg (130 lb)   LMP 03/30/2017   SpO2 100%   BMI 23.03 kg/m   Physical Exam  Constitutional: She appears well-developed and  well-nourished. No distress.  HENT:  Head: Normocephalic and atraumatic.  Mouth/Throat: Oropharynx is clear and moist. No oropharyngeal exudate.  Eyes: Conjunctivae are normal. Pupils are equal, round, and reactive to light. Right eye exhibits no discharge. Left eye exhibits no discharge. No scleral icterus.  Neck: Normal range of motion. Neck supple. No thyromegaly present.  Cardiovascular: Regular rhythm, normal heart sounds and intact distal pulses. Exam reveals no gallop and no friction rub.  No murmur heard. Pulmonary/Chest: Effort normal and breath sounds normal. No stridor. No respiratory distress. She has no wheezes. She has no rales.  Abdominal: Soft. Bowel sounds are normal. She exhibits no distension. There is no tenderness. There is no rebound and no guarding.  Genitourinary: Uterus normal. There is tenderness (72mm induration, mildly tender, no erythema) on the right labia. Cervix exhibits no motion tenderness. Right adnexum displays no tenderness. Left adnexum displays no tenderness. Vaginal discharge (white/yellow) found.  Genitourinary Comments: Chaperone present  Musculoskeletal: She exhibits no edema.  Lymphadenopathy:    She has no cervical adenopathy.  Neurological: She is alert. Coordination normal.  Skin: Skin is warm and dry. No rash noted. She is not diaphoretic. No pallor.  Psychiatric: She has a normal mood and affect.  Nursing note and vitals reviewed.    ED Treatments / Results  Labs (all labs ordered are listed, but only abnormal results are displayed) Labs Reviewed  WET PREP, GENITAL - Abnormal; Notable for the following components:      Result Value   Clue Cells Wet Prep HPF POC PRESENT (*)    WBC, Wet Prep HPF POC FEW (*)    All other components within normal limits  URINALYSIS, ROUTINE W REFLEX MICROSCOPIC - Abnormal; Notable for the following components:   APPearance HAZY (*)    All other components within normal limits  PREGNANCY, URINE  RPR  HIV  ANTIBODY (ROUTINE TESTING)  GC/CHLAMYDIA PROBE AMP ([place]) NOT AT [facility]    EKG  EKG Interpretation None       Radiology No results found.  Procedures Procedures (including critical care time)  Medications Ordered in ED Medications  cefTRIAXone (ROCEPHIN) injection 250 mg (250 mg Intramuscular Given 04/21/17 1729)  azithromycin (ZITHROMAX) tablet 1,000 mg (1,000 mg Oral Given 04/21/17 1728)  lidocaine (PF) (XYLOCAINE) 1 % injection (5 mLs  Given 04/21/17 1729)     Initial Impression / Assessment and Plan / ED Course  I have reviewed the triage vital signs and the nursing notes.  Pertinent labs & imaging results that were available during my care of the patient were reviewed by me and considered in my medical decision making (see chart for details).     Patient with 1 week  history of vaginal discharge.  Concern for STD exposure.  Partner known to have gonorrhea.  GC/chlamydia, HIV, RPR sent and pending.  Will treat prophylactically with Rocephin and azithromycin.  Pelvic exam is benign, no cervical motion tenderness or adnexal tenderness.  No abdominal tenderness.  Patient also with clue cells on wet prep.  Will discharge home with Flagyl.  Patient advised safe sex practices and follow-up to health department for future testing.  Patient does have resolving folliculitis.  No significant tenderness or erythema.  Considering drainage Artie completed and resolving, no treatment at this time.  Patient advised to follow-up with OB/GYN symptoms not resolving.  Return precautions discussed.  Patient understands and agrees with plan.  Patient vitals stable throughout ED course and discharged in satisfactory condition.  Final Clinical Impressions(s) / ED Diagnoses   Final diagnoses:  Vaginal discharge  BV (bacterial vaginosis)  Concern about STD in female without diagnosis    ED Discharge Orders        Ordered    metroNIDAZOLE (FLAGYL) 500 MG tablet  2 times daily     04/21/17  1723       Frederica Kuster, Hershal Coria 04/21/17 2114    Noemi Chapel, MD 04/23/17 1007

## 2017-04-21 NOTE — ED Triage Notes (Addendum)
Per Pt, Pt is coming from home with complaints of white vaginal discharge that started about one week ago. Pt has been sexually active with partner who was tested and treated for STD. Denies pain

## 2017-04-22 LAB — RPR: RPR Ser Ql: NONREACTIVE

## 2017-04-22 LAB — HIV ANTIBODY (ROUTINE TESTING W REFLEX): HIV Screen 4th Generation wRfx: NONREACTIVE

## 2017-04-23 LAB — GC/CHLAMYDIA PROBE AMP (~~LOC~~) NOT AT ARMC
Chlamydia: NEGATIVE
Neisseria Gonorrhea: POSITIVE — AB

## 2017-07-17 ENCOUNTER — Encounter (HOSPITAL_COMMUNITY): Payer: Self-pay | Admitting: Emergency Medicine

## 2017-07-17 ENCOUNTER — Emergency Department (HOSPITAL_COMMUNITY)
Admission: EM | Admit: 2017-07-17 | Discharge: 2017-07-18 | Disposition: A | Discharge status: Home / Self Care | Payer: Self-pay | Attending: Emergency Medicine | Admitting: Emergency Medicine

## 2017-07-17 DIAGNOSIS — N938 Other specified abnormal uterine and vaginal bleeding: Secondary | ICD-10-CM | POA: Insufficient documentation

## 2017-07-17 DIAGNOSIS — D509 Iron deficiency anemia, unspecified: Secondary | ICD-10-CM

## 2017-07-17 DIAGNOSIS — D649 Anemia, unspecified: Secondary | ICD-10-CM | POA: Insufficient documentation

## 2017-07-17 DIAGNOSIS — F17210 Nicotine dependence, cigarettes, uncomplicated: Secondary | ICD-10-CM | POA: Insufficient documentation

## 2017-07-17 LAB — URINALYSIS, ROUTINE W REFLEX MICROSCOPIC

## 2017-07-17 NOTE — ED Triage Notes (Signed)
Pt presents with irregular menstrual cycles x 2 months; pt states her usual menstrual cycle is 3-5 days; the last 2 months have been 10 days and 15 days; currently on the 15 day menstrual cycle where she is reporting large clots being passed, fatigue, lower abd cramping; pt denies pregnancy, has not taken a test

## 2017-07-18 LAB — CBC WITH DIFFERENTIAL/PLATELET
ABS IMMATURE GRANULOCYTES: 0 10*3/uL (ref 0.0–0.1)
BASOS PCT: 1 %
Basophils Absolute: 0.1 10*3/uL (ref 0.0–0.1)
Eosinophils Absolute: 0.2 10*3/uL (ref 0.0–0.7)
Eosinophils Relative: 3 %
HEMATOCRIT: 27.9 % — AB (ref 36.0–46.0)
HEMOGLOBIN: 8.7 g/dL — AB (ref 12.0–15.0)
IMMATURE GRANULOCYTES: 0 %
LYMPHS ABS: 3.9 10*3/uL (ref 0.7–4.0)
Lymphocytes Relative: 44 %
MCH: 23.5 pg — AB (ref 26.0–34.0)
MCHC: 31.2 g/dL (ref 30.0–36.0)
MCV: 75.2 fL — AB (ref 78.0–100.0)
MONO ABS: 0.6 10*3/uL (ref 0.1–1.0)
MONOS PCT: 8 %
NEUTROS ABS: 3.7 10*3/uL (ref 1.7–7.7)
Neutrophils Relative %: 44 %
PLATELETS: 316 10*3/uL (ref 150–400)
RBC: 3.71 MIL/uL — ABNORMAL LOW (ref 3.87–5.11)
RDW: 15.8 % — ABNORMAL HIGH (ref 11.5–15.5)
WBC: 8.5 10*3/uL (ref 4.0–10.5)

## 2017-07-18 LAB — WET PREP, GENITAL
Sperm: NONE SEEN
Trich, Wet Prep: NONE SEEN
Yeast Wet Prep HPF POC: NONE SEEN

## 2017-07-18 LAB — GC/CHLAMYDIA PROBE AMP (~~LOC~~) NOT AT ARMC
Chlamydia: NEGATIVE
NEISSERIA GONORRHEA: NEGATIVE

## 2017-07-18 LAB — BASIC METABOLIC PANEL
Anion gap: 3 — ABNORMAL LOW (ref 5–15)
BUN: 9 mg/dL (ref 6–20)
CHLORIDE: 111 mmol/L (ref 101–111)
CO2: 23 mmol/L (ref 22–32)
Calcium: 10.6 mg/dL — ABNORMAL HIGH (ref 8.9–10.3)
Creatinine, Ser: 0.7 mg/dL (ref 0.44–1.00)
GFR calc Af Amer: >60 mL/min (ref >60)
GLUCOSE: 95 mg/dL (ref 65–99)
POTASSIUM: 4.1 mmol/L (ref 3.5–5.1)
Sodium: 137 mmol/L (ref 135–145)

## 2017-07-18 LAB — HCG, QUANTITATIVE, PREGNANCY: hCG, Beta Chain, Quant, S: <1 m[IU]/mL (ref <5)

## 2017-07-18 LAB — URINE CYTOLOGY ANCILLARY ONLY
Chlamydia: NEGATIVE
Neisseria Gonorrhea: NEGATIVE

## 2017-07-18 MED ORDER — FERROUS SULFATE 325 (65 FE) MG PO TABS: 325.0000 mg | ORAL_TABLET | Freq: Every day | ORAL | 0 refills | Status: DC

## 2017-07-18 MED ORDER — MEGESTROL ACETATE 40 MG PO TABS: 40.0000 mg | ORAL_TABLET | Freq: Two times a day (BID) | ORAL | 0 refills | Status: DC

## 2017-07-18 MED ORDER — DOCUSATE SODIUM 250 MG PO CAPS: 250.0000 mg | ORAL_CAPSULE | Freq: Every day | ORAL | 0 refills | Status: DC

## 2017-07-18 NOTE — ED Provider Notes (Signed)
[facility] EMERGENCY DEPARTMENT Provider Note   CSN: [id number] Arrival date & time: 07/17/17  2245     History   Chief Complaint Chief Complaint  Patient presents with  . Vaginal Bleeding    HPI Katie Benson is a 37 y.o. female.  Patient presents for evaluation of heavy vaginal bleeding for the past 15 days. She reports her menses lasted 10 days last month which is a change from her usual 3-5 day cycles. Today she started passing large clots and became concerned. No syncope or near syncope. She has some moderate lower abdominal cramping. No fever, dysuria. She does not feel she is pregnant.   The history is provided by the patient. No language interpreter was used.  Vaginal Bleeding  Primary symptoms include vaginal bleeding.  Primary symptoms include no dysuria. Associated symptoms include abdominal pain. Pertinent negatives include no light-headedness.    Past Medical History:  Diagnosis Date  . Neck injury     Patient Active Problem List   Diagnosis Date Noted  . Breast lump in female 03/08/2012    Past Surgical History:  Procedure Laterality Date  . CESAREAN SECTION       OB History    Gravida  2   Para  1   Term  1   Preterm      AB  1   Living  1     SAB      TAB      Ectopic      Multiple      Live Births               Home Medications    Prior to Admission medications   Medication Sig Start Date End Date Taking? Authorizing Provider  cephALEXin (KEFLEX) 500 MG capsule Take 1 capsule (500 mg total) by mouth 4 (four) times daily. Patient not taking: Reported on 07/18/2017 10/15/15   Charlann Lange, PA-C  cyclobenzaprine (FLEXERIL) 10 MG tablet Take 1 tablet (10 mg total) by mouth 2 (two) times daily as needed for muscle spasms. Patient not taking: Reported on 07/18/2017 12/02/13   Al Corpus, PA-C  HYDROcodone-acetaminophen (NORCO/VICODIN) 5-325 MG per tablet Take 1-2 tablets by mouth every 4 (four) hours  as needed. Patient not taking: Reported on 07/18/2017 02/23/14   Delos Haring, PA-C  HYDROcodone-acetaminophen (NORCO/VICODIN) 5-325 MG tablet Take 1 tablet by mouth every 6 (six) hours as needed. Patient not taking: Reported on 07/18/2017 10/18/15   Delos Haring, PA-C  metroNIDAZOLE (FLAGYL) 500 MG tablet Take 1 tablet (500 mg total) by mouth 2 (two) times daily. Patient not taking: Reported on 07/18/2017 04/21/17   Frederica Kuster, PA-C  naproxen (NAPROSYN) 500 MG tablet Take 1 tablet (500 mg total) by mouth 2 (two) times daily. Patient not taking: Reported on 07/18/2017 03/28/16   Shary Decamp, PA-C  naproxen sodium (ANAPROX) 220 MG tablet Take 1 tablet (220 mg total) by mouth daily as needed (for pain). Patient not taking: Reported on 07/18/2017 07/09/14   Domenic Moras, PA-C  oxyCODONE-acetaminophen (PERCOCET/ROXICET) 5-325 MG tablet Take 1 tablet by mouth every 4 (four) hours as needed for severe pain. Patient not taking: Reported on 07/18/2017 10/15/15   Charlann Lange, PA-C    Family History Family History  Problem Relation Age of Onset  . Diabetes Sister   . Cancer Maternal Grandmother        ovarian    Social History Social History   Tobacco Use  . Smoking status: Current  Every Day Smoker    Packs/day: 0.25    Years: 14.00    Pack years: 3.50    Types: Cigarettes  . Smokeless tobacco: Never Used  Substance Use Topics  . Alcohol use: Yes    Comment: occassionally on weekends  . Drug use: Yes    Types: Marijuana     Allergies   Pork-derived products   Review of Systems Review of Systems  Constitutional: Positive for fatigue. Negative for fever.  Respiratory: Negative for shortness of breath.   Gastrointestinal: Positive for abdominal pain.  Genitourinary: Positive for vaginal bleeding. Negative for dysuria.  Neurological: Negative for syncope, weakness and light-headedness.     Physical Exam Updated Vital Signs BP 102/62 (BP Location: Right Arm)   Pulse 69    Temp 98.3 F (36.8 C) (Oral)   Resp 16   Ht 5\' 3"  (1.6 m)   Wt 59.9 kg (132 lb)   LMP 07/02/2017   SpO2 100%   BMI 23.38 kg/m   Physical Exam  Constitutional: She is oriented to person, place, and time. She appears well-developed and well-nourished. No distress.  Eyes:  No significant conjunctival pallor.  Cardiovascular: Normal rate and regular rhythm.  Pulmonary/Chest: Effort normal. She has no wheezes. She has no rales.  Abdominal: Soft. There is tenderness (Mild tenderness across lower abdomen without guarding.). There is no guarding.  Genitourinary:  Genitourinary Comments: Scant vaginal blood in vaginal vault. No clots. The cervix and pericervical areas have nard, nodular formations, ?fibroids.    Musculoskeletal: Normal range of motion.  Neurological: She is alert and oriented to person, place, and time.  Skin: Skin is warm and dry.     ED Treatments / Results  Labs (all labs ordered are listed, but only abnormal results are displayed) Labs Reviewed  URINALYSIS, ROUTINE W REFLEX MICROSCOPIC - Abnormal; Notable for the following components:      Result Value   Color, Urine RED (*)    APPearance TURBID (*)    Glucose, UA   (*)    Value: TEST NOT REPORTED DUE TO COLOR INTERFERENCE OF URINE PIGMENT   Hgb urine dipstick   (*)    Value: TEST NOT REPORTED DUE TO COLOR INTERFERENCE OF URINE PIGMENT   Bilirubin Urine   (*)    Value: TEST NOT REPORTED DUE TO COLOR INTERFERENCE OF URINE PIGMENT   Ketones, ur   (*)    Value: TEST NOT REPORTED DUE TO COLOR INTERFERENCE OF URINE PIGMENT   Protein, ur   (*)    Value: TEST NOT REPORTED DUE TO COLOR INTERFERENCE OF URINE PIGMENT   Nitrite   (*)    Value: TEST NOT REPORTED DUE TO COLOR INTERFERENCE OF URINE PIGMENT   Leukocytes, UA   (*)    Value: TEST NOT REPORTED DUE TO COLOR INTERFERENCE OF URINE PIGMENT   RBC / HPF >50 (*)    WBC, UA >50 (*)    Bacteria, UA RARE (*)    All other components within normal limits  WET PREP,  GENITAL  HCG, QUANTITATIVE, PREGNANCY  CBC WITH DIFFERENTIAL/PLATELET  BASIC METABOLIC PANEL  GC/CHLAMYDIA PROBE AMP ([place]) NOT AT [facility]    EKG None  Radiology No results found.  Procedures Procedures (including critical care time)  Medications Ordered in ED Medications - No data to display   Initial Impression / Assessment and Plan / ED Course  I have reviewed the triage vital signs and the nursing notes.  Pertinent labs & imaging  results that were available during my care of the patient were reviewed by me and considered in my medical decision making (see chart for details).     Patient presents with extended duration vaginal bleeding each of the last 2 menstrual cycles. No lightheadedness or syncope.   Hemoglobin is found low at 8.7. Mild anemia on previous labs when reviewed. No tachycardia or hypotension. She is hemodynamically stable.   Exam supports high likelihood of uterine fibroids. Will start on Megace and iron supplements (with Colace to avoid constipation). Explained the importance of GYN follow up and referrals are provided. Discussed return precautions and availability of [facility] for emergency gynecological needs.   Final Clinical Impressions(s) / ED Diagnoses   Final diagnoses:  None   1. DUB 2. Anemia   ED Discharge Orders    None       Charlann Lange, PA-C 07/18/17 [place], [place], DO 07/18/17 [phone]

## 2017-07-18 NOTE — ED Notes (Signed)
Pelvic cart at bedside. 

## 2017-08-20 ENCOUNTER — Encounter: Payer: Self-pay | Admitting: General Practice

## 2017-08-20 ENCOUNTER — Ambulatory Visit (INDEPENDENT_AMBULATORY_CARE_PROVIDER_SITE_OTHER): Payer: Self-pay | Admitting: Obstetrics and Gynecology

## 2017-08-20 ENCOUNTER — Encounter: Payer: Self-pay | Admitting: Obstetrics and Gynecology

## 2017-08-20 DIAGNOSIS — D649 Anemia, unspecified: Secondary | ICD-10-CM | POA: Insufficient documentation

## 2017-08-20 DIAGNOSIS — D500 Iron deficiency anemia secondary to blood loss (chronic): Secondary | ICD-10-CM

## 2017-08-20 DIAGNOSIS — N939 Abnormal uterine and vaginal bleeding, unspecified: Secondary | ICD-10-CM

## 2017-08-20 MED ORDER — FERROUS SULFATE 325 (65 FE) MG PO TABS: 325.0000 mg | ORAL_TABLET | Freq: Every day | ORAL | 5 refills | Status: DC

## 2017-08-20 MED ORDER — MEGESTROL ACETATE 40 MG PO TABS: 40.0000 mg | ORAL_TABLET | Freq: Two times a day (BID) | ORAL | 2 refills | Status: DC

## 2017-08-20 NOTE — Addendum Note (Signed)
Addended by: Bethanne Ginger on: 08/20/2017 05:09 PM   Modules accepted: Orders

## 2017-08-20 NOTE — Progress Notes (Signed)
Katie Benson presents for ER follow up for AUB. Pt reports regular monthly cycles until this past April. April cycle last 10 days Normally cycles last 3-5 days LMP 07/05/17. Has had some degree of bleeding since. Was seen in ER on 07/18/17. Noted to be anemia with H/H [phone]. Started on Megace 40 mg bid and iron. Bleeding has decreased to only some occ spotting. Ran out of Megace this past Friday however. Last pap 5/14 normal H/O fibrocystic breast changes G2P1001 H/O c section 1/2 ppd smoke Sexual active without condoms  PE AF VSS Lungs clear Heart RRR Abd soft + BS  GU Nl EGBUS no bleeding noted uterus 8-10 weeks size no masses  A/P AUB AUB reviewed with pt. Will check TSH and GYN U/S. Discussed OCP vs Megace. Pt would like to continue with Megace. Rx renewed as is iron. Will f/u in 6 weeks.

## 2017-08-20 NOTE — Patient Instructions (Signed)

## 2017-08-20 NOTE — Progress Notes (Signed)
Charity application given to patient. 

## 2017-08-21 LAB — TSH: TSH: 0.609 u[IU]/mL (ref 0.450–4.500)

## 2017-08-22 ENCOUNTER — Telehealth: Payer: Self-pay

## 2017-08-22 NOTE — Telephone Encounter (Signed)
Called Pt to advise of [place] appt on 08/27/17 @ 9:30am & to arrive at 9:15am w/ full bladder. No answer, left VM.

## 2017-08-22 NOTE — Telephone Encounter (Signed)
Patient called back and left message stating she was asleep and missed our call. I called her back and informed her a staff was calling to let her know she has an ultrasound scheduled 08/27/17 at 9:30, arrive 9:15 with full bladder.

## 2017-08-27 ENCOUNTER — Ambulatory Visit (HOSPITAL_COMMUNITY)
Admission: RE | Admit: 2017-08-27 | Discharge: 2017-08-27 | Disposition: A | Discharge status: Home / Self Care | Payer: Self-pay | Source: Ambulatory Visit | Attending: Obstetrics and Gynecology | Admitting: Obstetrics and Gynecology

## 2017-08-27 DIAGNOSIS — N939 Abnormal uterine and vaginal bleeding, unspecified: Secondary | ICD-10-CM | POA: Insufficient documentation

## 2017-08-27 DIAGNOSIS — D259 Leiomyoma of uterus, unspecified: Secondary | ICD-10-CM | POA: Insufficient documentation

## 2017-10-15 ENCOUNTER — Ambulatory Visit (INDEPENDENT_AMBULATORY_CARE_PROVIDER_SITE_OTHER): Payer: Self-pay | Admitting: Obstetrics and Gynecology

## 2017-10-15 ENCOUNTER — Encounter: Payer: Self-pay | Admitting: Obstetrics and Gynecology

## 2017-10-15 ENCOUNTER — Telehealth: Payer: Self-pay

## 2017-10-15 VITALS — BP 137/64 | HR 77 | Ht 63.0 in | Wt 131.7 lb

## 2017-10-15 DIAGNOSIS — N939 Abnormal uterine and vaginal bleeding, unspecified: Secondary | ICD-10-CM

## 2017-10-15 DIAGNOSIS — D219 Benign neoplasm of connective and other soft tissue, unspecified: Secondary | ICD-10-CM | POA: Insufficient documentation

## 2017-10-15 DIAGNOSIS — K5900 Constipation, unspecified: Secondary | ICD-10-CM

## 2017-10-15 DIAGNOSIS — D500 Iron deficiency anemia secondary to blood loss (chronic): Secondary | ICD-10-CM

## 2017-10-15 MED ORDER — DOCUSATE SODIUM 250 MG PO CAPS: 250.0000 mg | ORAL_CAPSULE | Freq: Every day | ORAL | 0 refills | Status: DC

## 2017-10-15 MED ORDER — FERROUS SULFATE 325 (65 FE) MG PO TABS: 325.0000 mg | ORAL_TABLET | Freq: Every day | ORAL | 5 refills | Status: DC

## 2017-10-15 MED ORDER — IBUPROFEN 800 MG PO TABS
800.0000 mg | ORAL_TABLET | Freq: Three times a day (TID) | ORAL | 3 refills | Status: DC | PRN
Start: 2017-10-15 — End: 2017-11-05

## 2017-10-15 NOTE — Patient Instructions (Signed)
Uterine Artery Embolization for Fibroids Uterine artery embolization is a nonsurgical treatment to shrink fibroids. A thin plastic tube (catheter) is used to inject material that blocks off the blood supply to the fibroid, which causes the fibroid to shrink. Tell a health care provider about:  Any allergies you have.  All medicines you are taking, including vitamins, herbs, eye drops, creams, and over-the-counter medicines.  Any problems you or family members have had with anesthetic medicines.  Any blood disorders you have.  Any surgeries you have had.  Any medical conditions you have. What are the risks?  Injury to the uterus from decreased blood supply  Infection.  Blood infection (septicemia).  Lack of menstrual periods (amenorrhea).  Death of tissue cells (necrosis) around your bladder or vulva.  Development of a hole between organs or from an organ to the surface of your skin (fistula).  Blood clot in the legs (deep vein thrombosis) or lung (pulmonary embolus). What happens before the procedure?  Ask your health care provider about changing or stopping your regular medicines.  Do not take aspirin or blood thinners (anticoagulants) for 1 week before the surgery or as directed by your health care provider.  Do not eat or drink anything for 8 hours before the surgery or as directed by your health care provider.  Empty your bladder before the procedure begins. What happens during the procedure?  An IV tube will be placed into one of your veins. This will be used to give you a sedative and pain medication (conscious sedation).  You will be given a medicine that numbs the area (local anesthetic).  A small cut will be made in your groin. A catheter is then inserted into the main artery of your leg.  The catheter will be guided through the artery to your uterus. A series of images will be taken while dye is injected through the catheter in your groin. X-rays are taken at  the same time. This is done to provide a road map of the blood supply to your uterus and fibroids.  Tiny plastic spheres, about the size of sand grains, will be injected through the catheter. Metal coils may be used to help block the artery. The particles will lodge in tiny branches of the uterine artery that supplies blood to the fibroids.  The procedure is repeated on the artery that supplies the other side of the uterus.  The catheter is then removed and pressure is held to stop any bleeding. No stitches are needed.  A dressing is then placed over the cut (incision). What happens after the procedure?  You will be taken to a recovery area where your progress will be monitored until you are awake, stable, and taking fluids well. If there are no other problems, you will then be moved to a regular hospital room.  You will be observed overnight in the hospital.  You will have cramping that should be controlled with pain medication. This information is not intended to replace advice given to you by your health care provider. Make sure you discuss any questions you have with your health care provider. Document Released: 05/01/2005 Document Revised: 07/22/2015 Document Reviewed: 08/29/2012 Elsevier Interactive Patient Education  2018 [place]. Pelvic Mass A pelvic mass is an abnormal growth in the pelvis. The pelvis is the area between your hip bones. It includes the bladder and the rectum in males and females, and also the uterus and ovaries in females. What are the causes? Many things can cause  a pelvic mass, including:  Cancer.  Fibroids of the uterus.  Ovarian cysts.  Infection.  Ectopic pregnancy.  What are the signs or symptoms? Symptoms of a pelvic mass may include:  Cramping.  Nausea.  Diarrhea.  Fever.  Vomiting.  Weakness.  Pain in the pelvis, side, or back.  Weight loss.  Constipation.  Problems with vaginal bleeding, including: ? Light or heavy  bleeding with or without blood clots. ? Irregular menstruation. ? Pain with menstruation.  Problems with urination, including: ? Frequent urination. ? Inability to empty the bladder completely. ? Urinating very small amounts. ? Pain with urination. ? Bloody urine.  Some pelvic masses do not cause symptoms. How is this diagnosed? To make a diagnosis, your health care provider will need to learn more about the mass. You may have tests or procedures done, such as:  Blood tests.  X-rays.  Ultrasound.  CT scan.  MRI.  A surgery to look inside of your abdomen with cameras (laparoscopy).  A biopsy that is performed with a needle or during laparoscopy or surgery.  In some cases, what seemed like a pelvic mass may actually be something else, such as a mass in one of the organs that are near the pelvis, an infection (abscess) or scar tissue (adhesions) that formed after a surgery. How is this treated? Treatment will depend on the cause of the mass. Follow these instructions at home: What you need to do at home will depend on the cause of the mass. Follow the instructions that your health care provider gives to you. In general:  Keep all follow-up visits as directed by your health care provider. This is important.  Take medicines only as directed by your health care provider.  Follow any restrictions that are given to you by your health care provider.  Contact a health care provider if:  You develop new symptoms. Get help right away if:  You vomit bright red blood or vomit material that looks like coffee grounds.  You have blood in your stools, or the stools turn black and tarry.  You have an abnormal or increased amount of vaginal bleeding.  You have a fever.  You develop easy bruising or bleeding.  You develop sudden or worsening pain that is not controlled by your medicine.  You feel worsening weakness, or you have a fainting episode.  You feel that the mass has  suddenly gotten larger.  You develop severe bloating in your abdomen or your pelvis.  You cannot pass any urine.  You are unable to have a bowel movement. This information is not intended to replace advice given to you by your health care provider. Make sure you discuss any questions you have with your health care provider. Document Released: 05/23/2006 Document Revised: 07/22/2015 Document Reviewed: 09/29/2013 Elsevier Interactive Patient Education  2018 [employer].

## 2017-10-15 NOTE — Progress Notes (Addendum)
Patient ID: Katie Benson, female   DOB: 1980/10/04, 37 y.o.   MRN: [id number] [name] presents for follow up of her DUB and GYN U/S. GYN U/S demonstrated a large central uterine fibroid. Normal ovaries and endometrium.  U/S findings reviewed with pt and husband. She reports feeling tired with Megace. Does not take the Megace daily. She reports some spotting and pain at times.  PE AF VSS Lungs clear Heart RRR Abd soft + BS  A/P AUB        Uterine fibroid        Anemia secondary to above.   Tx options reviewed with pt. Will check CBC today. Continue with oral iron. Following discussion of options pt would like to pursue [place]. Pt will be schedule for evaluation by radiology.

## 2017-10-15 NOTE — Telephone Encounter (Signed)
Pt called stating that she was not able to pick up the Rx for Colace per Dr. Rip Harbour. Pharmacy states was not sent to them. So re-ordered Colace 250 mg to go to pts Pharmacy and called her to advise to pick up within an hour. Pt verbalized understanding.

## 2017-10-15 NOTE — Addendum Note (Signed)
Addended by: Chancy Milroy on: 10/15/2017 11:56 AM   Modules accepted: Orders

## 2017-10-15 NOTE — Progress Notes (Signed)
Pt states hands getting numb, bleeding on & off since May 9th dark color. States not taking Megace everyday. Pt states has no energy but is taking her Iron.

## 2017-10-15 NOTE — Addendum Note (Signed)
Addended by: Bethanne Ginger on: 10/15/2017 05:19 PM   Modules accepted: Orders

## 2017-10-16 LAB — CBC
HEMATOCRIT: 35.5 % (ref 34.0–46.6)
Hemoglobin: 10.7 g/dL — ABNORMAL LOW (ref 11.1–15.9)
MCH: 23.5 pg — ABNORMAL LOW (ref 26.6–33.0)
MCHC: 30.1 g/dL — AB (ref 31.5–35.7)
MCV: 78 fL — AB (ref 79–97)
PLATELETS: 320 10*3/uL (ref 150–450)
RBC: 4.56 x10E6/uL (ref 3.77–5.28)
RDW: 15.7 % — AB (ref 12.3–15.4)
WBC: 9.1 10*3/uL (ref 3.4–10.8)

## 2017-10-16 MED ORDER — DOCUSATE SODIUM 250 MG PO CAPS: 250.0000 mg | ORAL_CAPSULE | Freq: Every day | ORAL | 0 refills | Status: DC

## 2017-10-16 NOTE — Addendum Note (Signed)
Addended by: Shelly Coss on: 10/16/2017 09:04 AM   Modules accepted: Orders

## 2017-10-17 ENCOUNTER — Other Ambulatory Visit (HOSPITAL_COMMUNITY): Payer: Self-pay | Admitting: Interventional Radiology

## 2017-10-17 ENCOUNTER — Ambulatory Visit
Admission: RE | Admit: 2017-10-17 | Discharge: 2017-10-17 | Disposition: A | Discharge status: Home / Self Care | Payer: Self-pay | Source: Ambulatory Visit | Attending: Obstetrics and Gynecology | Admitting: Obstetrics and Gynecology

## 2017-10-17 DIAGNOSIS — D219 Benign neoplasm of connective and other soft tissue, unspecified: Secondary | ICD-10-CM

## 2017-10-17 HISTORY — PX: IR RADIOLOGIST EVAL & MGMT: IMG5224

## 2017-10-17 HISTORY — DX: Benign neoplasm of connective and other soft tissue, unspecified: D21.9

## 2017-10-17 NOTE — Consult Note (Signed)
Chief Complaint: Patient was seen in consultation today for  Chief Complaint  Patient presents with  . Consult    Consult for [place]   at the request of Katie Benson  Referring Physician(s): Katie Benson  History of Present Illness: Katie Benson is a 37 y.o. female with a history of severe menorrhagia.  She has had multiple episodes of severe vaginal bleeding since April of this year.  Prior to that, she has had moderate cramping during her menstrual periods.  She was found to have one large fundal fibroid measuring 7.4 cm by sonography.  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  Currently, she is being treated with iron replacement therapy and Megace.  This has improved her symptoms but she does continue to have significant bleeding.  Her hemoglobin has been as low as 8.7 but recently is above 10.  Her cycles are somewhat irregular.  She does pass clots during her menstrual periods.  She is interested in future pregnancy plans and denies prior history of pelvic malignancy, radiation, or PID.  She denies perimenopausal symptoms.  She denies incontinence.  Past Medical History:  Diagnosis Date  . Fibroids   . Medical history non-contributory   . Neck injury     Past Surgical History:  Procedure Laterality Date  . CESAREAN SECTION      Allergies: Pork-derived products  Medications: Prior to Admission medications   Medication Sig Start Date End Date Taking? Authorizing Provider  docusate sodium (COLACE) 250 MG capsule Take 1 capsule (250 mg total) by mouth daily. 10/16/17  Yes Katie Milroy, MD  ferrous sulfate 325 (65 FE) MG tablet Take 1 tablet (325 mg total) by mouth daily. 10/15/17  Yes Katie Milroy, MD  ibuprofen (ADVIL,MOTRIN) 800 MG tablet Take 1 tablet (800 mg total) by mouth 3 (three) times daily with meals as needed for headache or moderate pain. 10/15/17  Yes Katie Milroy, MD  megestrol (MEGACE) 40 MG  tablet Take 1 tablet (40 mg total) by mouth 2 (two) times daily. 08/20/17  Yes Katie Milroy, MD     Family History  Problem Relation Age of Onset  . Diabetes Sister   . Cancer Maternal Grandmother        ovarian    Social History   Socioeconomic History  . Marital status: Single    Spouse name: Not on file  . Number of children: Not on file  . Years of education: Not on file  . Highest education level: Not on file  Occupational History  . Not on file  Social Needs  . Financial resource strain: Not on file  . Food insecurity:    Worry: Not on file    Inability: Not on file  . Transportation needs:    Medical: Not on file    Non-medical: Not on file  Tobacco Use  . Smoking status: Current Every Day Smoker    Packs/day: 0.25    Years: 14.00    Pack years: 3.50    Types: Cigarettes  . Smokeless tobacco: Never Used  Substance and Sexual Activity  . Alcohol use: Yes    Comment: occassionally on weekends  . Drug use: Yes    Types: Marijuana  . Sexual activity: Yes    Birth control/protection: Condom, None  Lifestyle  . Physical activity:    Days per week: Not on file    Minutes per session: Not on file  .  Stress: Not on file  Relationships  . Social connections:    Talks on phone: Not on file    Gets together: Not on file    Attends religious service: Not on file    Active member of club or organization: Not on file    Attends meetings of clubs or organizations: Not on file    Relationship status: Not on file  Other Topics Concern  . Not on file  Social History Narrative  . Not on file     Review of Systems: A 12 point ROS discussed and pertinent positives are indicated in the HPI above.  All other systems are negative.  Review of Systems  Vital Signs: BP 135/81   Pulse 75   Temp 98.5 F (36.9 C) (Oral)   Resp 14   Ht 5\' 3"  (1.6 m)   Wt 59.4 kg   SpO2 100%   BMI 23.21 kg/m   Physical Exam She is in no apparent distress. Neurological exam is  nonfocal.  Mood and affect are normal.  No edema or cyanosis.   Imaging: No results found.  Labs:  CBC: Recent Labs    07/18/17 0226 10/15/17 1037  WBC 8.5 9.1  HGB 8.7* 10.7*  HCT 27.9* 35.5  PLT 316 320    COAGS: No results for input(s): INR, APTT in the last 8760 hours.  BMP: Recent Labs    07/18/17 0226  NA 137  K 4.1  CL 111  CO2 23  GLUCOSE 95  BUN 9  CALCIUM 10.6*  CREATININE 0.70  GFRNONAA >60  GFRAA >60    LIVER FUNCTION TESTS: No results for input(s): BILITOT, AST, ALT, ALKPHOS, PROT, ALBUMIN in the last 8760 hours.  TUMOR MARKERS: No results for input(s): AFPTM, CEA, CA199, CHROMGRNA in the last 8760 hours.  Assessment and Plan:  Ms. Takaki does have severe menorrhagia and does have fibroids based on sonography.  This will need to be confirmed with MRI.  She will also require a Pap smear and endometrial biopsy as part of her work-up.  Over the course of 40 minutes, we discussed the procedure, risks, benefits, and alternatives of treatment for fibroids including hysterectomy and myomectomy.  She wishes to proceed with the work-up for uterine fibroid embolization.  Thank you for this interesting consult.  I greatly enjoyed meeting Katie Benson and look forward to participating in their care.  A copy of this report was sent to the requesting provider on this date.  Electronically Signed: Ethelbert Benson, ART A 10/17/2017, 3:34 PM   I spent a total of  40 Minutes   in face to face in clinical consultation, greater than 50% of which was counseling/coordinating care for uterine fibroids.

## 2017-10-17 NOTE — Progress Notes (Signed)
Received a message from [facility] who did her immobilization per order from [place].  They stated that the pt would need a pre procedure endometrial biopsy and also a pap smear.  Message sent to front office to call and schedule appt.

## 2017-10-17 NOTE — Progress Notes (Deleted)
Patient ID: Katie Benson, female   DOB: Sep 16, 1980, 37 y.o.   MRN: [id number]       Chief Complaint: Patient was seen in consultation today for  Chief Complaint  Patient presents with  . Consult    Consult for [place]   at the request of Ervin,Michael L  Referring Physician(s): Ervin,Michael L  History of Present Illness: Katie Benson is a 37 y.o. female who returns for follow-up of her low pelvic abscess drained.  She has been flushing her drain.  She denies any fevers or chills and is currently taking antibiotics.  Recently, her drain was exchanged.  She had 2 additional abscesses aspirated.  Cultures revealed gram-positive cocci from the additional abscess aspirations.  She is complaining of some vague right-sided abdominal pain.  Past Medical History:  Diagnosis Date  . Fibroids   . Medical history non-contributory   . Neck injury     Past Surgical History:  Procedure Laterality Date  . CESAREAN SECTION      Allergies: Pork-derived products  Medications: Prior to Admission medications   Medication Sig Start Date End Date Taking? Authorizing Provider  docusate sodium (COLACE) 250 MG capsule Take 1 capsule (250 mg total) by mouth daily. 10/16/17  Yes Chancy Milroy, MD  ferrous sulfate 325 (65 FE) MG tablet Take 1 tablet (325 mg total) by mouth daily. 10/15/17  Yes Chancy Milroy, MD  ibuprofen (ADVIL,MOTRIN) 800 MG tablet Take 1 tablet (800 mg total) by mouth 3 (three) times daily with meals as needed for headache or moderate pain. 10/15/17  Yes Chancy Milroy, MD  megestrol (MEGACE) 40 MG tablet Take 1 tablet (40 mg total) by mouth 2 (two) times daily. 08/20/17  Yes Chancy Milroy, MD     Family History  Problem Relation Age of Onset  . Diabetes Sister   . Cancer Maternal Grandmother        ovarian    Social History   Socioeconomic History  . Marital status: Single    Spouse name: Not on file  . Number of children: Not on file  . Years of education:  Not on file  . Highest education level: Not on file  Occupational History  . Not on file  Social Needs  . Financial resource strain: Not on file  . Food insecurity:    Worry: Not on file    Inability: Not on file  . Transportation needs:    Medical: Not on file    Non-medical: Not on file  Tobacco Use  . Smoking status: Current Every Day Smoker    Packs/day: 0.25    Years: 14.00    Pack years: 3.50    Types: Cigarettes  . Smokeless tobacco: Never Used  Substance and Sexual Activity  . Alcohol use: Yes    Comment: occassionally on weekends  . Drug use: Yes    Types: Marijuana  . Sexual activity: Yes    Birth control/protection: Condom, None  Lifestyle  . Physical activity:    Days per week: Not on file    Minutes per session: Not on file  . Stress: Not on file  Relationships  . Social connections:    Talks on phone: Not on file    Gets together: Not on file    Attends religious service: Not on file    Active member of club or organization: Not on file    Attends meetings of clubs or organizations: Not on file    Relationship status:  Not on file  Other Topics Concern  . Not on file  Social History Narrative  . Not on file     Review of Systems: A 12 point ROS discussed and pertinent positives are indicated in the HPI above.  All other systems are negative.  Review of Systems  Vital Signs: BP 135/81   Pulse 75   Temp 98.5 F (36.9 C) (Oral)   Resp 14   Ht 5\' 3"  (1.6 m)   Wt 59.4 kg   SpO2 100%   BMI 23.21 kg/m   Physical Exam She is in no apparent distress.  Abdomen is nontender.  The pelvic abscess drain insertion site is clean and dry without signs of localized infection.  Imaging: The pelvic abscess contains the drain and this has improved.  The pigtail has straightened.  There are 2 additional recurrent fluid collections adjacent to the liver and in the right lower quadrant.  There are smaller.    Labs:  CBC: Recent Labs    07/18/17 0226  10/15/17 1037  WBC 8.5 9.1  HGB 8.7* 10.7*  HCT 27.9* 35.5  PLT 316 320    COAGS: No results for input(s): INR, APTT in the last 8760 hours.  BMP: Recent Labs    07/18/17 0226  NA 137  K 4.1  CL 111  CO2 23  GLUCOSE 95  BUN 9  CALCIUM 10.6*  CREATININE 0.70  GFRNONAA >60  GFRAA >60    LIVER FUNCTION TESTS: No results for input(s): BILITOT, AST, ALT, ALKPHOS, PROT, ALBUMIN in the last 8760 hours.  TUMOR MARKERS: No results for input(s): AFPTM, CEA, CA199, CHROMGRNA in the last 8760 hours.  Assessment and Plan:  The pelvic abscess contains the drain but the drain has straightened.  This will require exchange.  It is being scheduled for August 26.  There are additional recurrent fluid collections adjacent to the liver and in the right lower quadrant.  Presently, she has no fevers and does complain of vague right-sided abdominal pain.  I would favor following these additional 2 areas in the hopes that complete drainage of the pelvic abscess will relieve her symptoms.  If she does develop fevers, drainage of the 2 additional areas may be required.  Thank you for this interesting consult.  I greatly enjoyed meeting Jaclynn Laumann and look forward to participating in their care.  A copy of this report was sent to the requesting provider on this date.  Electronically Signed: Calvyn Kurtzman, ART A 10/17/2017, 2:31 PM   I spent a total of   10 Minutes in face to face in clinical consultation, greater than 50% of which was counseling/coordinating care for abscess drainage.

## 2017-10-18 ENCOUNTER — Telehealth: Payer: Self-pay | Admitting: General Practice

## 2017-10-18 ENCOUNTER — Telehealth: Payer: Self-pay | Admitting: Obstetrics and Gynecology

## 2017-10-18 NOTE — Telephone Encounter (Signed)
Called patient and left message on voicemail to call office to give her appt on 11/05/17 @ 10:55.

## 2017-10-18 NOTE — Telephone Encounter (Signed)
Patient called & left message stating she is returning our phone call.

## 2017-10-19 ENCOUNTER — Telehealth: Payer: Self-pay

## 2017-10-19 NOTE — Telephone Encounter (Signed)
Called pt to give GYN appt for 11/05/17 with Dr. Rip Harbour at 10:55AM.Pt verbalized understanding.

## 2017-10-22 ENCOUNTER — Ambulatory Visit (HOSPITAL_COMMUNITY)
Admission: RE | Admit: 2017-10-22 | Discharge: 2017-10-22 | Disposition: A | Discharge status: Home / Self Care | Payer: Self-pay | Source: Ambulatory Visit | Attending: Interventional Radiology | Admitting: Interventional Radiology

## 2017-10-22 ENCOUNTER — Telehealth: Payer: Self-pay | Admitting: *Deleted

## 2017-10-22 DIAGNOSIS — D259 Leiomyoma of uterus, unspecified: Secondary | ICD-10-CM | POA: Insufficient documentation

## 2017-10-22 DIAGNOSIS — N852 Hypertrophy of uterus: Secondary | ICD-10-CM | POA: Insufficient documentation

## 2017-10-22 DIAGNOSIS — D219 Benign neoplasm of connective and other soft tissue, unspecified: Secondary | ICD-10-CM

## 2017-10-22 MED ORDER — GADOBENATE DIMEGLUMINE 529 MG/ML IV SOLN
15.0000 mL | Freq: Once | INTRAVENOUS | Status: AC | PRN
  Administered 2017-10-22: 13 mL via INTRAVENOUS

## 2017-10-22 NOTE — Telephone Encounter (Signed)
-----   Message from Chancy Milroy, MD sent at 10/18/2017  5:17 PM EDT ----- Please let Ms Kleiman that her Hgb has increased to 10.8. Continue with iron supplement Thanks Legrand Como

## 2017-11-05 ENCOUNTER — Other Ambulatory Visit (HOSPITAL_COMMUNITY)
Admission: RE | Admit: 2017-11-05 | Discharge: 2017-11-05 | Disposition: A | Discharge status: Home / Self Care | Payer: Self-pay | Source: Ambulatory Visit | Attending: Obstetrics and Gynecology | Admitting: Obstetrics and Gynecology

## 2017-11-05 ENCOUNTER — Ambulatory Visit (INDEPENDENT_AMBULATORY_CARE_PROVIDER_SITE_OTHER): Payer: Self-pay | Admitting: Obstetrics and Gynecology

## 2017-11-05 ENCOUNTER — Encounter: Payer: Self-pay | Admitting: Obstetrics and Gynecology

## 2017-11-05 VITALS — BP 124/81 | HR 72 | Ht 63.0 in | Wt 130.2 lb

## 2017-11-05 DIAGNOSIS — Z3202 Encounter for pregnancy test, result negative: Secondary | ICD-10-CM

## 2017-11-05 DIAGNOSIS — D219 Benign neoplasm of connective and other soft tissue, unspecified: Secondary | ICD-10-CM

## 2017-11-05 DIAGNOSIS — N939 Abnormal uterine and vaginal bleeding, unspecified: Secondary | ICD-10-CM

## 2017-11-05 DIAGNOSIS — K5900 Constipation, unspecified: Secondary | ICD-10-CM

## 2017-11-05 DIAGNOSIS — Z1151 Encounter for screening for human papillomavirus (HPV): Secondary | ICD-10-CM

## 2017-11-05 DIAGNOSIS — Z124 Encounter for screening for malignant neoplasm of cervix: Secondary | ICD-10-CM

## 2017-11-05 LAB — POCT PREGNANCY, URINE: PREG TEST UR: NEGATIVE

## 2017-11-05 MED ORDER — DOCUSATE SODIUM 250 MG PO CAPS: 250.0000 mg | ORAL_CAPSULE | Freq: Every day | ORAL | 0 refills | Status: DC

## 2017-11-05 MED ORDER — MEGESTROL ACETATE 40 MG PO TABS: 40.0000 mg | ORAL_TABLET | Freq: Two times a day (BID) | ORAL | 2 refills | Status: DC

## 2017-11-05 MED ORDER — IBUPROFEN 800 MG PO TABS: 800.0000 mg | ORAL_TABLET | Freq: Three times a day (TID) | ORAL | 3 refills | Status: DC | PRN

## 2017-11-05 NOTE — Progress Notes (Signed)
Pt states with pain also becomes nauseated.

## 2017-11-05 NOTE — Patient Instructions (Signed)
Endometrial Biopsy  Endometrial biopsy is a procedure in which a tissue sample is taken from inside the uterus. The sample is taken from the endometrium, which is the lining of the uterus. The tissue sample is then checked under a microscope to see if the tissue is normal or abnormal. This procedure helps to determine where you are in your menstrual cycle and how hormone levels are affecting the lining of the uterus. This procedure may also be used to evaluate uterine bleeding or to diagnose endometrial cancer, endometrial tuberculosis, polyps, or other inflammatory conditions.  Tell a health care provider about:   Any allergies you have.   All medicines you are taking, including vitamins, herbs, eye drops, creams, and over-the-counter medicines.   Any problems you or family members have had with anesthetic medicines.   Any blood disorders you have.   Any surgeries you have had.   Any medical conditions you have.   Whether you are pregnant or may be pregnant.  What are the risks?  Generally, this is a safe procedure. However, problems may occur, including:   Bleeding.   Pelvic infection.   Puncture of the wall of the uterus with the biopsy device (rare).    What happens before the procedure?   Keep a record of your menstrual cycles as told by your health care provider. You may need to schedule your procedure for a specific time in your cycle.   You may want to bring a sanitary pad to wear after the procedure.   Ask your health care provider about:  ? Changing or stopping your regular medicines. This is especially important if you are taking diabetes medicines or blood thinners.  ? Taking medicines such as aspirin and ibuprofen. These medicines can thin your blood. Do not take these medicines before your procedure if your health care provider instructs you not to.   Plan to have someone take you home from the hospital or clinic.  What happens during the procedure?   To lower your risk of  infection:  ? Your health care team will wash or sanitize their hands.   You will lie on an exam table with your feet and legs supported as in a pelvic exam.   Your health care provider will insert an instrument (speculum) into your vagina to see your cervix.   Your cervix will be cleansed with an antiseptic solution.   A medicine (local anesthetic) will be used to numb the cervix.   A forceps instrument (tenaculum) will be used to hold your cervix steady for the biopsy.   A thin, rod-like instrument (uterine sound) will be inserted through your cervix to determine the length of your uterus and the location where the biopsy sample will be removed.   A thin, flexible tube (catheter) will be inserted through your cervix and into the uterus. The catheter will be used to collect the biopsy sample from your endometrial tissue.   The catheter and speculum will then be removed, and the tissue sample will be sent to a lab for examination.  What happens after the procedure?   You will rest in a recovery area until you are ready to go home.   You may have mild cramping and a small amount of vaginal bleeding. This is normal.   It is up to you to get the results of your procedure. Ask your health care provider, or the department that is doing the procedure, when your results will be ready.  Summary     Endometrial biopsy is a procedure in which a tissue sample is taken from the endometrium, which is the lining of the uterus.   This procedure may help to diagnose menstrual cycle problems, abnormal bleeding, or other conditions affecting the endometrium.   Before the procedure, keep a record of your menstrual cycles as told by your health care provider.   The tissue sample that is removed will be checked under a microscope to see if it is normal or abnormal.  This information is not intended to replace advice given to you by your health care provider. Make sure you discuss any questions you have with your health care  provider.  Document Released: 06/16/2004 Document Revised: 03/01/2016 Document Reviewed: 03/01/2016  Elsevier Interactive Patient Education  2017 [employer]

## 2017-11-05 NOTE — Progress Notes (Signed)
Patient ID: Katie Benson, female   DOB: 03-06-1980, 37 y.o.   MRN: [id number] Pt here for pap semar and EMBX for w/u for [place]. Still some pain and requesting refill of her Motrin No bleeding with 40 mg Megace, needs refill  PE AF VSS Lungs clear Heart RRR Abd soft + bs GU nl EGBUS cervix no lesion pap smear obtained.  ENDOMETRIAL BIOPSY     The indications for endometrial biopsy were reviewed.   Risks of the biopsy including cramping, bleeding, infection, uterine perforation, inadequate specimen and need for additional procedures  were discussed. The patient states she understands and agrees to undergo procedure today. Consent was signed. Time out was performed. Urine HCG was negative. During the pelvic exam, the cervix was prepped with Betadine. A single-toothed tenaculum was placed on the anterior lip of the cervix to stabilize it. The 3 mm pipelle was introduced into the endometrial cavity without difficulty to a depth of 6cm, and a moderate amount of tissue was obtained and sent to pathology. The instruments were removed from the patient's vagina. Minimal bleeding from the cervix was noted. The patient tolerated the procedure well. Routine post-procedure instructions were given to the patient.    A/P AUB        Uterine fibroids  Pap smear and EMBX completed today. Pt will be contacted with results and will follow up with [facility] for [place] Meds refilled

## 2017-11-06 NOTE — Addendum Note (Signed)
Addended by: Dolores Hoose on: 11/06/2017 11:53 AM   Modules accepted: Orders

## 2017-11-09 ENCOUNTER — Telehealth: Payer: Self-pay

## 2017-11-09 ENCOUNTER — Telehealth: Payer: Self-pay | Admitting: *Deleted

## 2017-11-09 NOTE — Telephone Encounter (Signed)
Pt called today on the Nurse Line @ 1:11pm wanting to know her Pap Smear results, pt states has been trying to call to get info, she has no VM on her phone, request call back.

## 2017-11-09 NOTE — Telephone Encounter (Signed)
Called pt to relay Dr. Marjory Lies message of normal EMBX but pt did not pick up.  Voicemail left asking her to return a call to review the results.

## 2017-11-09 NOTE — Telephone Encounter (Signed)
Called pt to let her know that her Pap Smear results has not come back yet, No answer, no VM.

## 2017-11-09 NOTE — Telephone Encounter (Signed)
-----   Message from Chancy Milroy, MD sent at 11/08/2017  9:08 PM EDT ----- Please let Ms Darin know that her EMBX was normal. Waiting for pap smear results Thanks Legrand Como

## 2017-11-12 LAB — CYTOLOGY - PAP
Diagnosis: NEGATIVE
HPV (WINDOPATH): NOT DETECTED

## 2017-11-12 NOTE — Telephone Encounter (Signed)
Duplicate encounter

## 2017-11-12 NOTE — Telephone Encounter (Signed)
Notified pt that her pap smear and endo bx results are normal.  Pt asks when will she able to get her immobilization.  I informed pt that I would call and find out.  Pt gave permission for me to be able to leave message on VM.  Called [facility] and spoke with Jocelyn Lamer who informed me that they were waiting on the pt's endo bx results before calling to schedule an appt.  [place] office to fax requested results to [phone].  LM for pt that [facility] Sidle will call her with an appt if she could give them a few days.

## 2017-11-14 ENCOUNTER — Encounter: Payer: Self-pay | Admitting: *Deleted

## 2017-11-14 ENCOUNTER — Other Ambulatory Visit (HOSPITAL_COMMUNITY): Payer: Self-pay | Admitting: Interventional Radiology

## 2017-11-14 DIAGNOSIS — D259 Leiomyoma of uterus, unspecified: Secondary | ICD-10-CM

## 2017-12-06 ENCOUNTER — Encounter (HOSPITAL_COMMUNITY): Payer: Self-pay

## 2017-12-06 ENCOUNTER — Ambulatory Visit (HOSPITAL_COMMUNITY)
Admission: RE | Admit: 2017-12-06 | Discharge: 2017-12-06 | Disposition: A | Discharge status: Home / Self Care | Payer: Self-pay | Source: Ambulatory Visit | Attending: Interventional Radiology | Admitting: Interventional Radiology

## 2017-12-06 ENCOUNTER — Other Ambulatory Visit: Payer: Self-pay

## 2017-12-06 ENCOUNTER — Inpatient Hospital Stay (HOSPITAL_COMMUNITY)
Admission: RE | Admit: 2017-12-06 | Discharge: 2017-12-08 | DRG: 761 | Disposition: A | Discharge status: Home / Self Care | Payer: Self-pay | Source: Ambulatory Visit | Attending: Internal Medicine | Admitting: Internal Medicine

## 2017-12-06 ENCOUNTER — Observation Stay (HOSPITAL_COMMUNITY): Payer: Self-pay

## 2017-12-06 DIAGNOSIS — N920 Excessive and frequent menstruation with regular cycle: Secondary | ICD-10-CM | POA: Diagnosis present

## 2017-12-06 DIAGNOSIS — D259 Leiomyoma of uterus, unspecified: Secondary | ICD-10-CM | POA: Diagnosis present

## 2017-12-06 DIAGNOSIS — F17210 Nicotine dependence, cigarettes, uncomplicated: Secondary | ICD-10-CM | POA: Diagnosis present

## 2017-12-06 DIAGNOSIS — R51 Headache: Secondary | ICD-10-CM | POA: Diagnosis present

## 2017-12-06 LAB — BASIC METABOLIC PANEL
ANION GAP: 8 (ref 5–15)
Anion gap: 6 (ref 5–15)
BUN: 9 mg/dL (ref 6–20)
BUN: 10 mg/dL (ref 6–20)
CALCIUM: 13.1 mg/dL — AB (ref 8.9–10.3)
CHLORIDE: 111 mmol/L (ref 98–111)
CO2: 24 mmol/L (ref 22–32)
CO2: 23 mmol/L (ref 22–32)
CREATININE: 0.83 mg/dL (ref 0.44–1.00)
CREATININE: 0.83 mg/dL (ref 0.44–1.00)
Calcium: 13 mg/dL — ABNORMAL HIGH (ref 8.9–10.3)
Chloride: 107 mmol/L (ref 98–111)
GFR calc non Af Amer: >60 mL/min (ref >60)
Glucose, Bld: 91 mg/dL (ref 70–99)
Glucose, Bld: 90 mg/dL (ref 70–99)
Potassium: 4.6 mmol/L (ref 3.5–5.1)
Potassium: 4.5 mmol/L (ref 3.5–5.1)
SODIUM: 139 mmol/L (ref 135–145)
Sodium: 140 mmol/L (ref 135–145)

## 2017-12-06 LAB — ALBUMIN: Albumin: 4 g/dL (ref 3.5–5.0)

## 2017-12-06 LAB — CBC
HCT: 39 % (ref 36.0–46.0)
Hemoglobin: 12 g/dL (ref 12.0–15.0)
MCH: 23.5 pg — ABNORMAL LOW (ref 26.0–34.0)
MCHC: 30.8 g/dL (ref 30.0–36.0)
MCV: 76.3 fL — ABNORMAL LOW (ref 80.0–100.0)
NRBC: 0 % (ref 0.0–0.2)
PLATELETS: 314 10*3/uL (ref 150–400)
RBC: 5.11 MIL/uL (ref 3.87–5.11)
RDW: 15.8 % — AB (ref 11.5–15.5)
WBC: 8 10*3/uL (ref 4.0–10.5)

## 2017-12-06 LAB — URINALYSIS, ROUTINE W REFLEX MICROSCOPIC
Bilirubin Urine: NEGATIVE
GLUCOSE, UA: NEGATIVE mg/dL
KETONES UR: NEGATIVE mg/dL
Nitrite: NEGATIVE
Protein, ur: NEGATIVE mg/dL
SPECIFIC GRAVITY, URINE: 1.004 — AB (ref 1.005–1.030)
pH: 7 (ref 5.0–8.0)

## 2017-12-06 LAB — PROTIME-INR
INR: 0.95
PROTHROMBIN TIME: 12.6 s (ref 11.4–15.2)

## 2017-12-06 LAB — APTT: APTT: 25 s (ref 24–36)

## 2017-12-06 LAB — HCG, SERUM, QUALITATIVE: Preg, Serum: NEGATIVE

## 2017-12-06 MED ORDER — NALOXONE HCL 0.4 MG/ML IJ SOLN: 0.4000 mg | INTRAMUSCULAR | Status: DC | PRN

## 2017-12-06 MED ORDER — NICOTINE POLACRILEX 2 MG MT GUM
2.0000 mg | CHEWING_GUM | OROMUCOSAL | Status: DC | PRN
  Filled 2017-12-06: qty 1

## 2017-12-06 MED ORDER — KETOROLAC TROMETHAMINE 30 MG/ML IJ SOLN
30.0000 mg | Freq: Once | INTRAMUSCULAR | Status: AC
  Administered 2017-12-06: 30 mg via INTRAVENOUS
  Filled 2017-12-06: qty 1

## 2017-12-06 MED ORDER — ACETAMINOPHEN 500 MG PO TABS
1000.0000 mg | ORAL_TABLET | Freq: Three times a day (TID) | ORAL | Status: DC | PRN
  Administered 2017-12-07 – 2017-12-08 (×2): 1000 mg via ORAL
  Filled 2017-12-06 (×2): qty 2

## 2017-12-06 MED ORDER — SODIUM CHLORIDE 0.9 % IV BOLUS
1000.0000 mL | Freq: Once | INTRAVENOUS | Status: AC
  Administered 2017-12-06: 1000 mL via INTRAVENOUS

## 2017-12-06 MED ORDER — MIDAZOLAM HCL 2 MG/2ML IJ SOLN
INTRAMUSCULAR | Status: AC
  Filled 2017-12-06: qty 4

## 2017-12-06 MED ORDER — LIDOCAINE HCL 1 % IJ SOLN
INTRAMUSCULAR | Status: AC
  Filled 2017-12-06: qty 20

## 2017-12-06 MED ORDER — SODIUM CHLORIDE 0.9% FLUSH: 9.0000 mL | INTRAVENOUS | Status: DC | PRN

## 2017-12-06 MED ORDER — OXYCODONE HCL 5 MG PO TABS
5.0000 mg | ORAL_TABLET | Freq: Four times a day (QID) | ORAL | Status: DC | PRN
  Administered 2017-12-06 – 2017-12-08 (×5): 5 mg via ORAL
  Filled 2017-12-06 (×5): qty 1

## 2017-12-06 MED ORDER — CEFAZOLIN SODIUM-DEXTROSE 2-4 GM/100ML-% IV SOLN
INTRAVENOUS | Status: AC
  Filled 2017-12-06: qty 100

## 2017-12-06 MED ORDER — SODIUM CHLORIDE 0.9 % IV SOLN
INTRAVENOUS | Status: DC
  Administered 2017-12-06: 500 mL via INTRAVENOUS

## 2017-12-06 MED ORDER — NICOTINE 14 MG/24HR TD PT24
14.0000 mg | MEDICATED_PATCH | Freq: Every day | TRANSDERMAL | Status: DC
  Administered 2017-12-06 – 2017-12-08 (×3): 14 mg via TRANSDERMAL
  Filled 2017-12-06 (×3): qty 1

## 2017-12-06 MED ORDER — DIPHENHYDRAMINE HCL 12.5 MG/5ML PO ELIX
12.5000 mg | ORAL_SOLUTION | Freq: Four times a day (QID) | ORAL | Status: DC | PRN
  Filled 2017-12-06: qty 5

## 2017-12-06 MED ORDER — FENTANYL CITRATE (PF) 100 MCG/2ML IJ SOLN
INTRAMUSCULAR | Status: AC
  Filled 2017-12-06: qty 2

## 2017-12-06 MED ORDER — SODIUM CHLORIDE 0.9 % IV BOLUS: 500.0000 mL | Freq: Once | INTRAVENOUS | Status: DC

## 2017-12-06 MED ORDER — FENTANYL CITRATE (PF) 100 MCG/2ML IJ SOLN
INTRAMUSCULAR | Status: AC
  Filled 2017-12-06: qty 4

## 2017-12-06 MED ORDER — CEFAZOLIN SODIUM-DEXTROSE 2-4 GM/100ML-% IV SOLN
2.0000 g | Freq: Once | INTRAVENOUS | Status: DC
  Filled 2017-12-06: qty 100

## 2017-12-06 MED ORDER — ONDANSETRON HCL 4 MG/2ML IJ SOLN: 4.0000 mg | Freq: Four times a day (QID) | INTRAMUSCULAR | Status: DC | PRN

## 2017-12-06 MED ORDER — HYDROMORPHONE 1 MG/ML IV SOLN
INTRAVENOUS | Status: DC
  Filled 2017-12-06: qty 25

## 2017-12-06 MED ORDER — SODIUM CHLORIDE 0.9 % IV SOLN
INTRAVENOUS | Status: AC
  Administered 2017-12-06: 1 mL via INTRAVENOUS
  Administered 2017-12-06 – 2017-12-07 (×3): via INTRAVENOUS

## 2017-12-06 MED ORDER — DIPHENHYDRAMINE HCL 50 MG/ML IJ SOLN: 12.5000 mg | Freq: Four times a day (QID) | INTRAMUSCULAR | Status: DC | PRN

## 2017-12-06 NOTE — H&P (Signed)
Chief Complaint: Patient was seen in consultation today for uterine fibroids.  Referring Physician(s): Ervin,Michael L  Supervising Physician: Marybelle Killings  Patient Status: [facility] - Out-pt  History of Present Illness: Katie Benson is a 37 y.o. female with a past medical history of uterine fibroids with associated menorrhagia. She has had multiple episodes of severe vaginal bleeding since 05/2017. She consulted with Dr. Barbie Banner 10/17/2017 for management of uterine fibroids. At that time, patient decided to move forward with uterine artery embolization. She underwent the usual workup, including MRI pelvis 10/22/2017 (results below), pap smear 11/05/2017 (results were normal), and endometrial biopsy 11/05/2017 (results were normal).  MRI pelvis 10/22/2017: 1. Enlarged myomatous uterus with solitary enhancing 6.2 cm transmural right uterine body fibroid. No pedunculated or intracavitary fibroids. 2. Normal ovaries.  No adnexal masses.  Patient presents today for possible image-guided mesenteric/pelvic angiogram with possible uterine artery embolization. Patient awake and alert laying in bed with no complaints at this time. Denies fever, chills, chest pain, dyspnea, abdominal pain, dizziness, or headache.   Past Medical History:  Diagnosis Date  . Fibroids   . Medical history non-contributory   . Neck injury     Past Surgical History:  Procedure Laterality Date  . CESAREAN SECTION    . IR RADIOLOGIST EVAL & MGMT  10/17/2017    Allergies: Pork-derived products  Medications: Prior to Admission medications   Medication Sig Start Date End Date Taking? Authorizing Provider  docusate sodium (COLACE) 250 MG capsule Take 1 capsule (250 mg total) by mouth daily. 11/05/17   Chancy Milroy, MD  ferrous sulfate 325 (65 FE) MG tablet Take 1 tablet (325 mg total) by mouth daily. 10/15/17   Chancy Milroy, MD  ibuprofen (ADVIL,MOTRIN) 800 MG tablet Take 1 tablet (800 mg total) by mouth 3 (three)  times daily with meals as needed for headache or moderate pain. 11/05/17   Chancy Milroy, MD  megestrol (MEGACE) 40 MG tablet Take 1 tablet (40 mg total) by mouth 2 (two) times daily. 11/05/17   Chancy Milroy, MD     Family History  Problem Relation Age of Onset  . Diabetes Sister   . Cancer Maternal Grandmother        ovarian    Social History   Socioeconomic History  . Marital status: Single    Spouse name: Not on file  . Number of children: Not on file  . Years of education: Not on file  . Highest education level: Not on file  Occupational History  . Not on file  Social Needs  . Financial resource strain: Not on file  . Food insecurity:    Worry: Not on file    Inability: Not on file  . Transportation needs:    Medical: Not on file    Non-medical: Not on file  Tobacco Use  . Smoking status: Current Every Day Smoker    Packs/day: 0.25    Years: 14.00    Pack years: 3.50    Types: Cigarettes  . Smokeless tobacco: Never Used  Substance and Sexual Activity  . Alcohol use: Yes    Comment: occassionally on weekends  . Drug use: Yes    Types: Marijuana  . Sexual activity: Yes    Birth control/protection: Condom, None  Lifestyle  . Physical activity:    Days per week: Not on file    Minutes per session: Not on file  . Stress: Not on file  Relationships  . Social connections:  Talks on phone: Not on file    Gets together: Not on file    Attends religious service: Not on file    Active member of club or organization: Not on file    Attends meetings of clubs or organizations: Not on file    Relationship status: Not on file  Other Topics Concern  . Not on file  Social History Narrative  . Not on file     Review of Systems: A 12 point ROS discussed and pertinent positives are indicated in the HPI above.  All other systems are negative.  Review of Systems  Constitutional: Negative for chills and fever.  Respiratory: Negative for shortness of breath and  wheezing.   Cardiovascular: Negative for chest pain and palpitations.  Gastrointestinal: Negative for abdominal pain.  Neurological: Negative for dizziness and headaches.  Psychiatric/Behavioral: Negative for behavioral problems and confusion.    Vital Signs: BP 119/85 (BP Location: Right Arm)   Pulse 85   Temp 99 F (37.2 C) (Oral)   Resp 16   SpO2 100%   Physical Exam  Constitutional: She is oriented to person, place, and time. She appears well-developed and well-nourished. No distress.  Cardiovascular: Normal rate, regular rhythm and normal heart sounds.  No murmur heard. Pulmonary/Chest: Effort normal and breath sounds normal. No respiratory distress. She has no wheezes.  Neurological: She is alert and oriented to person, place, and time.  Skin: Skin is warm and dry.  Psychiatric: She has a normal mood and affect. Her behavior is normal. Judgment and thought content normal.  Nursing note and vitals reviewed.    MD Evaluation Airway: WNL Heart: WNL Abdomen: WNL Chest/ Lungs: WNL ASA  Classification: 2 Mallampati/Airway Score: One   Imaging: No results found.  Labs:  CBC: Recent Labs    07/18/17 0226 10/15/17 1037  WBC 8.5 9.1  HGB 8.7* 10.7*  HCT 27.9* 35.5  PLT 316 320    COAGS: No results for input(s): INR, APTT in the last 8760 hours.  BMP: Recent Labs    07/18/17 0226  NA 137  K 4.1  CL 111  CO2 23  GLUCOSE 95  BUN 9  CALCIUM 10.6*  CREATININE 0.70  GFRNONAA >60  GFRAA >60    LIVER FUNCTION TESTS: No results for input(s): BILITOT, AST, ALT, ALKPHOS, PROT, ALBUMIN in the last 8760 hours.  TUMOR MARKERS: No results for input(s): AFPTM, CEA, CA199, CHROMGRNA in the last 8760 hours.  Assessment and Plan:  Uterine fibroids. Plan for image-guided mesenteric/pelvic angiogram with possible uterine artery embolization today with Dr. Barbie Banner. Patient is NPO. Afebrile. She does not take blood thinners. INR pending. MRI confirms presence of  1 uterine fibroid. Pap smear and endometrial biopsy results normal.  Risks and benefits of mesenteric/pelvic angiogram with intervention were discussed with the patient including, but not limited to bleeding, infection, vascular injury, contrast induced renal failure, stroke or even death. This interventional procedure involves the use of X-rays and because of the nature of the planned procedure, it is possible that we will have prolonged use of X-ray fluoroscopy. Potential radiation risks to you include (but are not limited to) the following: - A slightly elevated risk for cancer  several years later in life. This risk is typically less than 0.5% percent. This risk is low in comparison to the normal incidence of human cancer, which is 33% for women and 50% for men according to the [employer]. - Radiation induced injury can include skin redness, resembling  a rash, tissue breakdown / ulcers and hair loss (which can be temporary or permanent).  The likelihood of either of these occurring depends on the difficulty of the procedure and whether you are sensitive to radiation due to previous procedures, disease, or genetic conditions.  IF your procedure requires a prolonged use of radiation, you will be notified and given written instructions for further action.  It is your responsibility to monitor the irradiated area for the 2 weeks following the procedure and to notify your physician if you are concerned that you have suffered a radiation induced injury.   All of the patient's questions were answered, patient is agreeable to proceed. Consent signed and in chart.   Thank you for this interesting consult.  I greatly enjoyed meeting Katie Benson and look forward to participating in their care.  A copy of this report was sent to the requesting provider on this date.  Electronically Signed: Earley Abide, PA-C 12/06/2017, 8:44 AM   I spent a total of 25 Minutes in face to face in  clinical consultation, greater than 50% of which was counseling/coordinating care for uterine fibroids.

## 2017-12-06 NOTE — Progress Notes (Signed)
12 lead EkG performed.  Consultation with Dr. Florene Glen completed  Admission orders written.  Report called to receiving RN.  pPatient transferred via w/c to rm [id number], accompanied by boyfriend and this nurse.

## 2017-12-06 NOTE — Progress Notes (Signed)
Patient remains in NSR w/o ectopy.  Lab called with results of Calcium, repeated value at 13.0.  Dr. Barbie Banner, interventional radiologist, in to see patient.  Procedure postponed at present.  Will have Dr. Florene Glen, hospitalist, come  to see pt. Plans to admit patient overnight for observation.

## 2017-12-06 NOTE — Progress Notes (Signed)
CRITICAL VALUE ALERT  Critical Value:  Ca 13.1  Date & Time Notied:  12-06-2017 10:12  Provider Notified: Dr Barbie Banner 10:12  Orders Received/Actions taken: Per Dr Barbie Banner redraw labs and place patient on heart monitor.  RN Lesia Hausen took verbal order and is relaying information to [facility] RN

## 2017-12-06 NOTE — Consult Note (Signed)
[facility] Medical Consultation  Monte Bronder AVW:[id number] DOB: Jun 20, 1980 DOA: 12/06/2017 PCP: Patient, No Pcp Per   Requesting physician: Hoss Date of consultation: 10/10 Reason for consultation: Hypercalcemia  Impression/Recommendations Active Problems:   Hypercalcemia    1. Hypercalcemia: Uncorrected calcium of 13.0, albumin still pending for correction.  She may be mildly symptomatic with generalized fatigue/weakness, polyuria/polydipsia (though she attributes this to the megace).  She's had mildly chronically elevated calcium in the past, though this corrected with albumin in 2011.  She denies using OTC vitamin D/calcium.  At this point, differential is still broad and includes hyperparathyroidism as well as malignancy.  Of note, megace noted to be associated with hypercalcemia on review in uptodate, but I haven't been able to find much else about this.   Will start treatment with hydration with normal saline.  Will begin workup with PTH.  Also follow chest x ray.  Follow UA as well given urinary frequency.  Pending results of PTH, would consider adding on PTHrp, 1, 25 vitamin D and 25 vitamin D.  She does not have Nyasha Rahilly PCP and would benefit from Jaisha Villacres primary care provider to follow up her hypercalcemia and her other chronic medical issues (will place care management consult).   2. Headache: she describes as minor migraine, continue APAP prn, follow  3. Smoking: encouraged cessation  4. Uterine Fibroids  Menorrhagia: Planned for uterine artery embolization today, but with abnormal labs, procedure placed on hold pending workup.  Admitted by radiogists under observation with hospitalists consulting for initial work up and treatment of hypercalcemia.  [facility] will continue to follow. Please contact me if I can be of assistance in the meanwhile. Thank you for this consultation.  Chief Complaint: menorrhagia  HPI:  37 year old female with Angelina Venard history of uterine fibroids and menorrhagia  presenting today for possible uterine artery embolization.  She was noted to have hypercalcemia, incidentally and hospitalists were consulted for further evaluation.  She's had several episodes of severe vaginal bleeding since 05/2017 and had planned to undergo uterine artery embolization today.  On discussion today, she notes that she's been weak and has had Javarious Elsayed HA for the past few weeks.  She notes low energy and sleeping Shirleen Mcfaul lot.    She denies constipation.  She notes cramping abdominal pain.  No fevers, no chills, no cough, no CP, no SOB.  She notes thirst/polyuria that she thinks started after the megace.  She notes constant vaginal bleeding, improved since megace was started.  Review of Systems:  Layann Bluett complete review of systems was negative except as noted per HPI  Past Medical History:  Diagnosis Date  . Fibroids   . Medical history non-contributory   . Neck injury    Past Surgical History:  Procedure Laterality Date  . CESAREAN SECTION    . IR RADIOLOGIST EVAL & MGMT  10/17/2017   Social History:  reports that she has been smoking cigarettes. She has Keliah Harned 3.50 pack-year smoking history. She has never used smokeless tobacco. She reports that she drinks alcohol. She reports that she has current or past drug history. Drug: Marijuana.  Allergies  Allergen Reactions  . Pork-Derived Products Hives and Swelling   Family History  Problem Relation Age of Onset  . Diabetes Sister   . Cancer Maternal Grandmother        ovarian    Prior to Admission medications   Medication Sig Start Date End Date Taking? Authorizing Provider  docusate sodium (COLACE) 250 MG capsule Take 1  capsule (250 mg total) by mouth daily. 11/05/17   Chancy Milroy, MD  ferrous sulfate 325 (65 FE) MG tablet Take 1 tablet (325 mg total) by mouth daily. 10/15/17   Chancy Milroy, MD  ibuprofen (ADVIL,MOTRIN) 800 MG tablet Take 1 tablet (800 mg total) by mouth 3 (three) times daily with meals as needed for headache or moderate  pain. 11/05/17   Chancy Milroy, MD  megestrol (MEGACE) 40 MG tablet Take 1 tablet (40 mg total) by mouth 2 (two) times daily. 11/05/17   Chancy Milroy, MD   Physical Exam: Blood pressure 119/85, pulse 85, temperature 99 F (37.2 C), temperature source Oral, resp. rate 16, SpO2 100 %. Vitals:   12/06/17 0819  BP: 119/85  Pulse: 85  Resp: 16  Temp: 99 F (37.2 C)  SpO2: 100%     General:  NAD, pt lying in bed, appears comfortable  Eyes: PERRL, anicteric sclera  HEENT: [place]/AT  Neck: supple, thyroid without appreciable nodule/goiter  Cardiovascular: RRR, no mgr  Respiratory: CTAB, no increased WOB  Abdomen: s/nt/nd  Skin: warm and dry  Musculoskeletal: moving all extremities, good tone  Psychiatric: Appropriate mood and affect  Neurologic: 1+ biceps reflex bilaterally, no focal neurologic deficits  Labs on Admission:  Basic Metabolic Panel: Recent Labs  Lab 12/06/17 0904 12/06/17 1023  NA 139 140  K 4.6 4.5  CL 107 111  CO2 24 23  GLUCOSE 91 90  BUN 9 10  CREATININE 0.83 0.83  CALCIUM 13.1* 13.0*   Liver Function Tests: No results for input(s): AST, ALT, ALKPHOS, BILITOT, PROT, ALBUMIN in the last 168 hours. No results for input(s): LIPASE, AMYLASE in the last 168 hours. No results for input(s): AMMONIA in the last 168 hours. CBC: Recent Labs  Lab 12/06/17 0904  WBC 8.0  HGB 12.0  HCT 39.0  MCV 76.3*  PLT 314   Cardiac Enzymes: No results for input(s): CKTOTAL, CKMB, CKMBINDEX, TROPONINI in the last 168 hours. BNP: Invalid input(s): POCBNP CBG: No results for input(s): GLUCAP in the last 168 hours.  Radiological Exams on Admission: No results found.  EKG: Independently reviewed. Sinus bradycardia.  First degree AV block.  LAFB.  No priors for comparison.  Time spent: 50 minutes  Fayrene Helper [facility] Pager [phone]   If 7PM-7AM, please contact night-coverage [email or web address] Password [email or web address] 12/06/2017, 11:56  AM

## 2017-12-06 NOTE — Progress Notes (Signed)
Patient ID: Katie Benson, female   DOB: 08/21/1980, 37 y.o.   MRN: [id number] The patient was noted to have a calcium level of 13.1, but is asymptomatic. I will reach out to the hospitalist on call to evaluate. The patient is NSR by EKG.

## 2017-12-06 NOTE — Progress Notes (Signed)
Blood redrawn for lab to recheck calcium and sent.  Patient placed on monitor, shows NSR, rate 70-80's w/o ectopy at present.

## 2017-12-06 NOTE — Care Management Note (Signed)
Case Management Note  Patient Details  Name: Katie Benson MRN: [id number] Date of Birth: [birth year]  Subjective/Objective:37 y/o f admitted w/hypercalcemia. From home. CM referral for pcp-provided patient w/pcp listing, & community resources-will discuss pcp list in am-patient going for procedure.                    Action/Plan:dc home.   Expected Discharge Date:                  Expected Discharge Plan:  Home/Self Care  In-House Referral:     Discharge planning Services  CM Consult, [facility]  Post Acute Care Choice:    Choice offered to:     DME Arranged:    DME Agency:     HH Arranged:    HH Agency:     Status of Service:  In process, will continue to follow  If discussed at Long Length of Stay Meetings, dates discussed:    Additional Comments:  Dessa Phi, RN 12/06/2017, 2:30 PM

## 2017-12-06 NOTE — Progress Notes (Signed)
Notified Dr Barbie Banner about calcium being elevated with blood redraw for BMET. He will speak with hospitalist and keep [place] informed if case is cancelled.

## 2017-12-07 LAB — BASIC METABOLIC PANEL
Anion gap: 4 — ABNORMAL LOW (ref 5–15)
BUN: 14 mg/dL (ref 6–20)
CALCIUM: 12.3 mg/dL — AB (ref 8.9–10.3)
CO2: 21 mmol/L — AB (ref 22–32)
CREATININE: 0.75 mg/dL (ref 0.44–1.00)
Chloride: 117 mmol/L — ABNORMAL HIGH (ref 98–111)
GFR calc Af Amer: >60 mL/min (ref >60)
GFR calc non Af Amer: >60 mL/min (ref >60)
Glucose, Bld: 98 mg/dL (ref 70–99)
Potassium: 4.4 mmol/L (ref 3.5–5.1)
Sodium: 142 mmol/L (ref 135–145)

## 2017-12-07 LAB — PTH, INTACT AND CALCIUM
CALCIUM TOTAL (PTH): 11.7 mg/dL — AB (ref 8.7–10.2)
PTH: 46 pg/mL (ref 15–65)

## 2017-12-07 LAB — CBC
HCT: 34.7 % — ABNORMAL LOW (ref 36.0–46.0)
Hemoglobin: 10.8 g/dL — ABNORMAL LOW (ref 12.0–15.0)
MCH: 24.1 pg — ABNORMAL LOW (ref 26.0–34.0)
MCHC: 31.1 g/dL (ref 30.0–36.0)
MCV: 77.3 fL — ABNORMAL LOW (ref 80.0–100.0)
Platelets: 291 10*3/uL (ref 150–400)
RBC: 4.49 MIL/uL (ref 3.87–5.11)
RDW: 15.8 % — ABNORMAL HIGH (ref 11.5–15.5)
WBC: 8.1 10*3/uL (ref 4.0–10.5)
nRBC: 0 % (ref 0.0–0.2)

## 2017-12-07 LAB — ALBUMIN: Albumin: 3.4 g/dL — ABNORMAL LOW (ref 3.5–5.0)

## 2017-12-07 LAB — MAGNESIUM: Magnesium: 2.1 mg/dL (ref 1.7–2.4)

## 2017-12-07 MED ORDER — SODIUM CHLORIDE 0.9 % IV SOLN
INTRAVENOUS | Status: DC
  Administered 2017-12-07 – 2017-12-08 (×2): via INTRAVENOUS

## 2017-12-07 MED ORDER — ONDANSETRON HCL 4 MG/2ML IJ SOLN
4.0000 mg | Freq: Four times a day (QID) | INTRAMUSCULAR | Status: DC | PRN
  Administered 2017-12-07 – 2017-12-08 (×3): 4 mg via INTRAVENOUS
  Filled 2017-12-07 (×3): qty 2

## 2017-12-07 MED ORDER — SODIUM CHLORIDE 0.9 % IV BOLUS
1000.0000 mL | Freq: Once | INTRAVENOUS | Status: AC
  Administered 2017-12-07: 1000 mL via INTRAVENOUS

## 2017-12-07 MED ORDER — CALCITONIN (SALMON) 200 UNIT/ML IJ SOLN
4.0000 [IU]/kg | Freq: Two times a day (BID) | INTRAMUSCULAR | Status: DC
  Administered 2017-12-07 (×2): 238 [IU] via SUBCUTANEOUS
  Filled 2017-12-07 (×3): qty 1.19

## 2017-12-07 NOTE — Progress Notes (Signed)
Patient ID: Katie Benson, female   DOB: 03/10/1980, 37 y.o.   MRN: [id number] Patient still disappointed that fibroid embolization could not be done today.  She remains hypercalcemic with today's level at 12.3.  [facility] has agreed to assume medical care of patient and IR service appreciative of their help.  Case discussed with Dr. Barbie Banner and decision made to have patient follow-up with IR service in clinic in approximately 2 weeks to determine rescheduling of UFE.  We will contact patient with appointment date/time.  Above discussed with patient.

## 2017-12-07 NOTE — Progress Notes (Signed)
Spoke to patient in rm about [place] [place] for pcp where she can also use their pharmacy @ [facility],patient can make own appt-patient voiced understanding. No further CM needs.

## 2017-12-07 NOTE — Progress Notes (Signed)
[facility] PROGRESS NOTE    Progress Note  Katie Benson  YNW:[id number] DOB: [birth year] DOA: 12/06/2017 PCP: Patient, No Pcp Per     Brief Narrative:   Katie Benson is an 37 y.o. female past medical history of uterine fibroids, menorrhagia presents today for possible uterine embolization found to have hypercalcemia and tried was asked to take over.  Assessment/Plan:   Active Problems:   Hypercalcemia Uncorrected calcium was 13 albumin was 3.4.  With IV fluid hydration today's 12.3. I will go ahead and give her a bolus of normal saline give her IV subcutaneous calcitonin and recheck calcium tomorrow morning.  Send PTH related peptide, PO4. Discontinue Megace use Zofran for nausea.   DVT prophylaxis:SCD Family Communication:boyfriend Disposition Plan/Barrier to D/C: home in am Code Status:     IV Access:    Peripheral IV   Procedures and diagnostic studies:   Dg Chest 2 View  Result Date: 12/06/2017 CLINICAL DATA:  Hypercalcemia. EXAM: CHEST - 2 VIEW COMPARISON:  11/19/2009 thoracic spine FINDINGS: The heart size and mediastinal contours are within normal limits. Both lungs are clear. The visualized skeletal structures are unremarkable. IMPRESSION: No active cardiopulmonary disease. Electronically Signed   By: Nolon Nations M.D.   On: 12/06/2017 14:50     Medical Consultants:    None.  Anti-Infectives:    Subjective:    Katie Benson patient is in a bad mood at the procedures not to be done today.  Objective:    Vitals:   12/06/17 0819 12/06/17 1251 12/06/17 2122 12/06/17 2220  BP: 119/85 118/78 138/86   Pulse: 85 (!) 54 (!) 58   Resp: 16 (!) 26    Temp: 99 F (37.2 C) 98.5 F (36.9 C) 98.7 F (37.1 C) 98.6 F (37 C)  TempSrc: Oral Oral Oral Oral  SpO2: 100% 100% 100%     Intake/Output Summary (Last 24 hours) at 12/07/2017 1025 Last data filed at 12/07/2017 0909 Gross per 24 hour  Intake 3745.87 ml  Output 3280 ml  Net  465.87 ml   There were no vitals filed for this visit.  Exam: General exam: In no acute distress. Respiratory system: Good air movement and clear to auscultation. Cardiovascular system: S1 & S2 heard, RRR.  Gastrointestinal system: Abdomen is nondistended, soft and nontender.  Central nervous system: Alert and oriented. No focal neurological deficits. Extremities: No pedal edema. Skin: No rashes, lesions or ulcers Psychiatry: Judgement and insight appear normal. Mood & affect appropriate.    Data Reviewed:    Labs: Basic Metabolic Panel: Recent Labs  Lab 12/06/17 0904 12/06/17 1023 12/06/17 1343 12/07/17 0544  NA 139 140  --  142  K 4.6 4.5  --  4.4  CL 107 111  --  117*  CO2 24 23  --  21*  GLUCOSE 91 90  --  98  BUN 9 10  --  14  CREATININE 0.83 0.83  --  0.75  CALCIUM 13.1* 13.0* 11.7* 12.3*  MG  --   --   --  2.1   GFR Estimated Creatinine Clearance: 78.9 mL/min (by C-G formula based on SCr of 0.75 mg/dL). Liver Function Tests: Recent Labs  Lab 12/06/17 1023 12/07/17 0544  ALBUMIN 4.0 3.4*   No results for input(s): LIPASE, AMYLASE in the last 168 hours. No results for input(s): AMMONIA in the last 168 hours. Coagulation profile Recent Labs  Lab 12/06/17 0904  INR 0.95    CBC: Recent Labs  Lab 12/06/17 0904 12/07/17  0544  WBC 8.0 8.1  HGB 12.0 10.8*  HCT 39.0 34.7*  MCV 76.3* 77.3*  PLT 314 291   Cardiac Enzymes: No results for input(s): CKTOTAL, CKMB, CKMBINDEX, TROPONINI in the last 168 hours. BNP (last 3 results) No results for input(s): PROBNP in the last 8760 hours. CBG: No results for input(s): GLUCAP in the last 168 hours. D-Dimer: No results for input(s): DDIMER in the last 72 hours. Hgb A1c: No results for input(s): HGBA1C in the last 72 hours. Lipid Profile: No results for input(s): CHOL, HDL, LDLCALC, TRIG, CHOLHDL, LDLDIRECT in the last 72 hours. Thyroid function studies: No results for input(s): TSH, T4TOTAL, T3FREE,  THYROIDAB in the last 72 hours.  Invalid input(s): FREET3 Anemia work up: No results for input(s): VITAMINB12, FOLATE, FERRITIN, TIBC, IRON, RETICCTPCT in the last 72 hours. Sepsis Labs: Recent Labs  Lab 12/06/17 0904 12/07/17 0544  WBC 8.0 8.1   Microbiology No results found for this or any previous visit (from the past 240 hour(s)).   Medications:   . nicotine  14 mg Transdermal Daily   Continuous Infusions: . sodium chloride 150 mL/hr at 12/07/17 0447      LOS: 0 days   Charlynne Cousins  [facility] Pager [phone]  *Please refer to [email or web address], password [email or web address] to get updated schedule on who will round on this patient, as hospitalists switch teams weekly. If 7PM-7AM, please contact night-coverage at [email or web address], password [email or web address] for any overnight needs.  12/07/2017, 10:25 AM

## 2017-12-08 LAB — BASIC METABOLIC PANEL
ANION GAP: 9 (ref 5–15)
BUN: 7 mg/dL (ref 6–20)
CO2: 18 mmol/L — AB (ref 22–32)
CREATININE: 0.71 mg/dL (ref 0.44–1.00)
Calcium: 10.4 mg/dL — ABNORMAL HIGH (ref 8.9–10.3)
Chloride: 112 mmol/L — ABNORMAL HIGH (ref 98–111)
GFR calc non Af Amer: >60 mL/min (ref >60)
Glucose, Bld: 85 mg/dL (ref 70–99)
Potassium: 4.3 mmol/L (ref 3.5–5.1)
Sodium: 139 mmol/L (ref 135–145)

## 2017-12-08 LAB — RENAL FUNCTION PANEL
Albumin: 3.8 g/dL (ref 3.5–5.0)
Anion gap: 6 (ref 5–15)
BUN: 7 mg/dL (ref 6–20)
CO2: 18 mmol/L — ABNORMAL LOW (ref 22–32)
Calcium: 10.2 mg/dL (ref 8.9–10.3)
Chloride: 112 mmol/L — ABNORMAL HIGH (ref 98–111)
Creatinine, Ser: 0.65 mg/dL (ref 0.44–1.00)
GFR calc Af Amer: >60 mL/min (ref >60)
GFR calc non Af Amer: >60 mL/min (ref >60)
Glucose, Bld: 90 mg/dL (ref 70–99)
Phosphorus: 1.6 mg/dL — ABNORMAL LOW (ref 2.5–4.6)
Potassium: 4.1 mmol/L (ref 3.5–5.1)
Sodium: 136 mmol/L (ref 135–145)

## 2017-12-08 LAB — VITAMIN D 25 HYDROXY (VIT D DEFICIENCY, FRACTURES): Vit D, 25-Hydroxy: 7.6 ng/mL — ABNORMAL LOW (ref 30.0–100.0)

## 2017-12-08 MED ORDER — ONDANSETRON 4 MG PO TBDP: 4.0000 mg | ORAL_TABLET | Freq: Three times a day (TID) | ORAL | 0 refills | Status: DC | PRN

## 2017-12-08 MED ORDER — PANTOPRAZOLE SODIUM 40 MG PO TBEC: 40.0000 mg | DELAYED_RELEASE_TABLET | Freq: Two times a day (BID) | ORAL | 1 refills | Status: DC

## 2017-12-08 NOTE — Progress Notes (Signed)
Patient D/C'd home with husband. FC removed and pt able to void 818 ml with no complications prior to D/C.

## 2017-12-08 NOTE — Progress Notes (Signed)
Pt. Verbalized concerns d/t illness and being out of work for the past couple of days for expected procedure. Since fibroid emobolization was cancelled 10/11 d/t hypercalcemia, pt. Is expected to see IR outpatient in 2 weeks. Pt. States she is unable to work in the conditions she is in without the procedure being performed. Pt. Verbalized fear of possibly getting laid off from work. This RN expressed to pt. That I will inform day RN of her current situation in hopes of getting a social work consult for possible resources. Pt. Is appreciative of this, and asked if we can help.

## 2017-12-08 NOTE — Discharge Summary (Signed)
Physician Discharge Summary  Katie Benson SHF:[id number] DOB: 14-Apr-1980 DOA: 12/06/2017  PCP: Katie Benson, No Pcp Per  Admit date: 12/06/2017 Discharge date: 12/08/2017  Admitted From: home Disposition:  Home  Recommendations for Outpatient Follow-up:  1. Follow up with PCP in 1-2 weeks 2. Please obtain BMP/CBC in one week   Home Health:No Equipment/Devices:None  Discharge Condition:stable CODE STATUS:Full Diet recommendation: Heart Healthy   Brief/Interim Summary: 37 y.o. female past medical history of uterine fibroids, menorrhagia presents today for possible uterine embolization found to have hypercalcemia and tried was asked to take over.  Discharge Diagnoses:  Active Problems:   Hypercalcemia  Uncorrected calcium was 13 with an albumin of 3.4 she was hydrated and given calcitonin and her calcium improved to 10.2. Her vitamin D 25 hydroxy was 7.6, PTH 4.6, her phosphorus was 1.6.  The differential includes familial hypocalciuric hypercalcemia she is had nephrolithiasis in the past. She needs a 24-hour urine calcium collection. Will refer to endocrinologist  Discharge Instructions  Discharge Instructions    Diet - low sodium heart healthy   Complete by:  As directed    Increase activity slowly   Complete by:  As directed      Allergies as of 12/08/2017      Reactions   Pork-derived Products Hives, Swelling      Medication List    STOP taking these medications   ibuprofen 800 MG tablet Commonly known as:  ADVIL,MOTRIN     TAKE these medications   docusate sodium 250 MG capsule Commonly known as:  COLACE Take 1 capsule (250 mg total) by mouth daily. What changed:    when to take this  reasons to take this   ferrous sulfate 325 (65 FE) MG tablet Take 1 tablet (325 mg total) by mouth daily.   megestrol 40 MG tablet Commonly known as:  MEGACE Take 1 tablet (40 mg total) by mouth 2 (two) times daily.   ondansetron 4 MG disintegrating  tablet Commonly known as:  ZOFRAN-ODT Take 1 tablet (4 mg total) by mouth every 8 (eight) hours as needed for nausea or vomiting.   pantoprazole 40 MG tablet Commonly known as:  PROTONIX Take 1 tablet (40 mg total) by mouth 2 (two) times daily.      Follow-up Information    Delrae Rend, MD Follow up.   Specialty:  Endocrinology Contact information: [address] [place] [address] [phone]          Allergies  Allergen Reactions  . Pork-Derived Products Hives and Swelling    Consultations:  None   Procedures/Studies: Dg Chest 2 View  Result Date: 12/06/2017 CLINICAL DATA:  Hypercalcemia. EXAM: CHEST - 2 VIEW COMPARISON:  11/19/2009 thoracic spine FINDINGS: The heart size and mediastinal contours are within normal limits. Both lungs are clear. The visualized skeletal structures are unremarkable. IMPRESSION: No active cardiopulmonary disease. Electronically Signed   By: Nolon Nations M.D.   On: 12/06/2017 14:50     Subjective: No complaints feels great  Discharge Exam: Vitals:   12/07/17 2135 12/08/17 0419  BP: (!) 116/58 (!) 107/56  Pulse: 61 64  Resp: 18 16  Temp: 98.8 F (37.1 C) 98.2 F (36.8 C)  SpO2: 100% 100%   Vitals:   12/07/17 1404 12/07/17 1406 12/07/17 2135 12/08/17 0419  BP: (!) 135/101 126/81 (!) 116/58 (!) 107/56  Pulse: (!) 51 (!) 50 61 64  Resp: 18 18 18 16   Temp: 98.3 F (36.8 C)  98.8 F (37.1  C) 98.2 F (36.8 C)  TempSrc: Oral  Oral Oral  SpO2: 100% 100% 100% 100%    General: Pt is alert, awake, not in acute distress Cardiovascular: RRR, S1/S2 +, no rubs, no gallops Respiratory: CTA bilaterally, no wheezing, no rhonchi Abdominal: Soft, NT, ND, bowel sounds + Extremities: no edema, no cyanosis    The results of significant diagnostics from this hospitalization (including imaging, microbiology, ancillary and laboratory) are listed below for reference.     Microbiology: No results found for this  or any previous visit (from the past 240 hour(s)).   Labs: BNP (last 3 results) No results for input(s): BNP in the last 8760 hours. Basic Metabolic Panel: Recent Labs  Lab 12/06/17 0904 12/06/17 1023 12/06/17 1343 12/07/17 0544 12/08/17 0543  NA 139 140  --  142 136  K 4.6 4.5  --  4.4 4.1  CL 107 111  --  117* 112*  CO2 24 23  --  21* 18*  GLUCOSE 91 90  --  98 90  BUN 9 10  --  14 7  CREATININE 0.83 0.83  --  0.75 0.65  CALCIUM 13.1* 13.0* 11.7* 12.3* 10.2  MG  --   --   --  2.1  --   PHOS  --   --   --   --  1.6*   Liver Function Tests: Recent Labs  Lab 12/06/17 1023 12/07/17 0544 12/08/17 0543  ALBUMIN 4.0 3.4* 3.8   No results for input(s): LIPASE, AMYLASE in the last 168 hours. No results for input(s): AMMONIA in the last 168 hours. CBC: Recent Labs  Lab 12/06/17 0904 12/07/17 0544  WBC 8.0 8.1  HGB 12.0 10.8*  HCT 39.0 34.7*  MCV 76.3* 77.3*  PLT 314 291   Cardiac Enzymes: No results for input(s): CKTOTAL, CKMB, CKMBINDEX, TROPONINI in the last 168 hours. BNP: Invalid input(s): POCBNP CBG: No results for input(s): GLUCAP in the last 168 hours. D-Dimer No results for input(s): DDIMER in the last 72 hours. Hgb A1c No results for input(s): HGBA1C in the last 72 hours. Lipid Profile No results for input(s): CHOL, HDL, LDLCALC, TRIG, CHOLHDL, LDLDIRECT in the last 72 hours. Thyroid function studies No results for input(s): TSH, T4TOTAL, T3FREE, THYROIDAB in the last 72 hours.  Invalid input(s): FREET3 Anemia work up No results for input(s): VITAMINB12, FOLATE, FERRITIN, TIBC, IRON, RETICCTPCT in the last 72 hours. Urinalysis    Component Value Date/Time   COLORURINE COLORLESS (A) 12/06/2017 2226   APPEARANCEUR CLEAR 12/06/2017 2226   LABSPEC 1.004 (L) 12/06/2017 2226   PHURINE 7.0 12/06/2017 2226   GLUCOSEU NEGATIVE 12/06/2017 2226   HGBUR SMALL (A) 12/06/2017 2226   BILIRUBINUR NEGATIVE 12/06/2017 2226   KETONESUR NEGATIVE 12/06/2017 2226    PROTEINUR NEGATIVE 12/06/2017 2226   UROBILINOGEN 0.2 01/14/2013 1130   NITRITE NEGATIVE 12/06/2017 2226   LEUKOCYTESUR SMALL (A) 12/06/2017 2226   Sepsis Labs Invalid input(s): PROCALCITONIN,  WBC,  LACTICIDVEN Microbiology No results found for this or any previous visit (from the past 240 hour(s)).   Time coordinating discharge: 40 minutes  SIGNED:   Charlynne Cousins, MD  [facility] 12/08/2017, 9:13 AM Pager   If 7PM-7AM, please contact night-coverage [email or web address] Password [email or web address]

## 2017-12-10 MED FILL — ONDANSETRON ODT 4 MG TABLET: 4 | 6 days supply | Qty: 20 | Fill #0

## 2017-12-10 MED FILL — PANTOPRAZOLE SOD DR 40 MG T: 40 | 30 days supply | Qty: 60 | Fill #0

## 2017-12-11 ENCOUNTER — Other Ambulatory Visit: Payer: Self-pay | Admitting: *Deleted

## 2017-12-11 DIAGNOSIS — D250 Submucous leiomyoma of uterus: Secondary | ICD-10-CM

## 2017-12-14 LAB — PTH-RELATED PEPTIDE: PTH-related peptide: <2 pmol/L

## 2017-12-18 LAB — BASIC METABOLIC PANEL WITH GFR
BUN: 9 mg/dL (ref 7–25)
CALCIUM: 12.4 mg/dL — AB (ref 8.6–10.2)
CO2: 24 mmol/L (ref 20–32)
CREATININE: 0.91 mg/dL (ref 0.50–1.10)
Chloride: 109 mmol/L (ref 98–110)
GFR, EST NON AFRICAN AMERICAN: 80 mL/min/{1.73_m2} (ref >=60)
GFR, Est African American: 93 mL/min/{1.73_m2} (ref >=60)
Glucose, Bld: 87 mg/dL (ref 65–139)
Potassium: 4.1 mmol/L (ref 3.5–5.3)
Sodium: 139 mmol/L (ref 135–146)

## 2017-12-20 ENCOUNTER — Ambulatory Visit
Admission: RE | Admit: 2017-12-20 | Discharge: 2017-12-20 | Disposition: A | Discharge status: Home / Self Care | Payer: Self-pay | Source: Ambulatory Visit | Attending: Radiology | Admitting: Radiology

## 2017-12-20 ENCOUNTER — Encounter: Payer: Self-pay | Admitting: Radiology

## 2017-12-20 DIAGNOSIS — D259 Leiomyoma of uterus, unspecified: Secondary | ICD-10-CM

## 2017-12-20 HISTORY — PX: IR RADIOLOGIST EVAL & MGMT: IMG5224

## 2017-12-20 NOTE — Progress Notes (Addendum)
Referring Physician(s): Dr. Rip Harbour  Chief Complaint: The patient is seen in follow up today s/p hypercalcemia, uterine fibroids  History of present illness: Katie Benson is a 37 y.o. female with a past medical history of symptomatic uterine fibroids. She was seen in consultation with Dr. Barbie Banner 10/17/2017 and elected to proceed with uterine artery embolization procedure. Unfortunately the day of her procedure she was found to have asymptomatic hypercalcemia. An EKG performed was normal, however patient was admitted for further work-up and procedure delayed. She was discharged home with plans for endocrinology follow-up.  She presents to IR clinic today for repeat lab work and procedure planning.   Patient reports she has been feeling poorly at home.  She was unable to schedule an appointment with an endocrinologist, however, has an appointment with her PCP tomorrow (10/25) to dicuss hypercalcemia.  She continues the Megace which contributes to her abdominal pain. Her menorrhagia related to her fibroids is unchanged.     Past Medical History:  Diagnosis Date  . Fibroids   . Medical history non-contributory   . Neck injury     Past Surgical History:  Procedure Laterality Date  . CESAREAN SECTION    . IR RADIOLOGIST EVAL & MGMT  10/17/2017  . IR RADIOLOGIST EVAL & MGMT  12/20/2017    Allergies: Pork-derived products  Medications: Prior to Admission medications   Medication Sig Start Date End Date Taking? Authorizing Provider  docusate sodium (COLACE) 250 MG capsule Take 1 capsule (250 mg total) by mouth daily. Patient taking differently: Take 250 mg by mouth daily as needed for constipation.  11/05/17  Yes Chancy Milroy, MD  ferrous sulfate 325 (65 FE) MG tablet Take 1 tablet (325 mg total) by mouth daily. 10/15/17  Yes Chancy Milroy, MD  megestrol (MEGACE) 40 MG tablet Take 1 tablet (40 mg total) by mouth 2 (two) times daily. 11/05/17  Yes Chancy Milroy, MD  ondansetron  (ZOFRAN ODT) 4 MG disintegrating tablet Take 1 tablet (4 mg total) by mouth every 8 (eight) hours as needed for nausea or vomiting. 12/08/17  Yes Charlynne Cousins, MD  pantoprazole (PROTONIX) 40 MG tablet Take 1 tablet (40 mg total) by mouth 2 (two) times daily. 12/08/17 12/08/18 Yes Charlynne Cousins, MD     Family History  Problem Relation Age of Onset  . Diabetes Sister   . Cancer Maternal Grandmother        ovarian    Social History   Socioeconomic History  . Marital status: Single    Spouse name: Not on file  . Number of children: Not on file  . Years of education: Not on file  . Highest education level: Not on file  Occupational History  . Not on file  Social Needs  . Financial resource strain: Not on file  . Food insecurity:    Worry: Not on file    Inability: Not on file  . Transportation needs:    Medical: Not on file    Non-medical: Not on file  Tobacco Use  . Smoking status: Current Every Day Smoker    Packs/day: 0.25    Years: 14.00    Pack years: 3.50    Types: Cigarettes  . Smokeless tobacco: Never Used  Substance and Sexual Activity  . Alcohol use: Yes    Comment: occassionally on weekends  . Drug use: Yes    Types: Marijuana  . Sexual activity: Yes    Birth control/protection: Condom, None  Lifestyle  .  Physical activity:    Days per week: Not on file    Minutes per session: Not on file  . Stress: Not on file  Relationships  . Social connections:    Talks on phone: Not on file    Gets together: Not on file    Attends religious service: Not on file    Active member of club or organization: Not on file    Attends meetings of clubs or organizations: Not on file    Relationship status: Not on file  Other Topics Concern  . Not on file  Social History Narrative  . Not on file     Vital Signs: BP 119/74   Pulse 84   Temp 98.8 F (37.1 C) (Oral)   Resp 15   Ht '5\' 3"'  (1.6 m)   Wt 135 lb (61.2 kg)   SpO2 100%   BMI 23.91 kg/m    Physical Exam  NAD, alert Heart: RRR Chest: no adventitious breath sounds  Imaging: Ir Radiologist Eval & Mgmt  Result Date: 12/20/2017 Please refer to notes tab for details about interventional procedure. (Op Note)   Labs:  CBC: Recent Labs    07/18/17 0226 10/15/17 1037 12/06/17 0904 12/07/17 0544  WBC 8.5 9.1 8.0 8.1  HGB 8.7* 10.7* 12.0 10.8*  HCT 27.9* 35.5 39.0 34.7*  PLT 316 320 314 291    COAGS: Recent Labs    12/06/17 0904  INR 0.95  APTT 25    BMP: Recent Labs    12/07/17 0544 12/08/17 0538 12/08/17 0543 12/18/17 1420  NA 142 139 136 139  K 4.4 4.3 4.1 4.1  CL 117* 112* 112* 109  CO2 21* 18* 18* 24  GLUCOSE 98 85 90 87  BUN '14 7 7 9  ' CALCIUM 12.3* 10.4* 10.2 12.4*  CREATININE 0.75 0.71 0.65 0.91  GFRNONAA >60 >60 >60 80  GFRAA >60 >60 >60 93    LIVER FUNCTION TESTS: Recent Labs    12/06/17 1023 12/07/17 0544 12/08/17 0543  ALBUMIN 4.0 3.4* 3.8    Assessment: Hypercalcemia, symptomatic uterine fibroids Patient with plans for uterine artery embolization however currently on hold due to incidental finding of hypercalcemia during pre-procedure work-up.  Patient was admitted 10/10-10/12 for evaluation by internal medicine, however no cause was found for her hypercalcemia.  She was treated with hydration and her Megace was held.  Her calcium trended down prior to discharge.  She has since resumed her normal medications including Megace.  Her calcium at redraw 12/18/17 was again elevated at 12.4. Dr. Anselm Pancoast has met with patient to discuss results and plans moving forward. Patient encouraged to keep her appointment with her PCP for ongoing investigation.  Once appropriate evaluation complete and calcium level determined by either PCP or endocrinologist to be inconsequential to administration of sedation medication and uterine artery embolization procedure (which includes the administration of steroids, pain medication, and NSAIDs) patient may  review plans to proceed with IR MD. Katie Benson verbalizes understanding of plans at this time.   Signed: Docia Barrier, PA 12/20/2017, 3:47 PM   Please refer to Dr. Anselm Pancoast attestation of this note for management and plan.

## 2017-12-21 ENCOUNTER — Encounter: Payer: Self-pay | Admitting: Family Medicine

## 2017-12-21 ENCOUNTER — Ambulatory Visit (INDEPENDENT_AMBULATORY_CARE_PROVIDER_SITE_OTHER): Payer: Self-pay | Admitting: Family Medicine

## 2017-12-21 DIAGNOSIS — D259 Leiomyoma of uterus, unspecified: Secondary | ICD-10-CM

## 2017-12-21 MED ORDER — ACETAMINOPHEN-CODEINE 300-30 MG PO TABS: 1.0000 | ORAL_TABLET | ORAL | 0 refills | Status: DC | PRN

## 2017-12-21 MED FILL — ACETAMINOPHEN/COD #3 TABLET: 300-30 | 2 days supply | Qty: 16 | Fill #0

## 2017-12-21 NOTE — Progress Notes (Signed)
Denelle Capurro, is a 37 y.o. female  OZH:[id number]  GEX:[id number]  DOB - 1980/05/07  CC:  Chief Complaint  Patient presents with  . Establish Care  . Hospitalization Follow-up    hosp 10/10-10/12: uterine leiomyoma & hypercalcemia. hospital wanted CBC/BMP repeated       HPI: Verdie is a 37 y.o. female is here today to establish care.   [name] has Abnormal uterine bleeding (AUB); Anemia; Fibroid; Fibroid uterus; and Hypercalcemia on their problem list.    Today's visit:  Lailani Tool current every day smoker of cigarettes and endorses marijuana use.  She was recently scheduled for surgery to have a uterine fibroids removed. Prior to surgery, labs revealed that patient's calcium level was critically elevated at a level 13.1. Surgery was postponed. Elevation in calcium was thought to be related to chronic megace use prescribed to control AUB. She was treated with calcitonin and level improved to 10.2. She was referred to endocrinology for evaluation however is uninsured and can not afford the office visit.  She is currently taking the Megace to prevent bleeding.  She was told in order for surgery to proceed she would need a letter from her primary care provider advising that she is cleared medically for surgery. Patient denies new headaches, chest pain, abdominal pain, nausea, new weakness , numbness or tingling, SOB, edema, or worrisome cough.   Current medications: Current Outpatient Medications:  .  docusate sodium (COLACE) 250 MG capsule, Take 1 capsule (250 mg total) by mouth daily. (Patient taking differently: Take 250 mg by mouth daily as needed for constipation. ), Disp: 10 capsule, Rfl: 0 .  ferrous sulfate 325 (65 FE) MG tablet, Take 1 tablet (325 mg total) by mouth daily., Disp: 30 tablet, Rfl: 5 .  megestrol (MEGACE) 40 MG tablet, Take 1 tablet (40 mg total) by mouth 2 (two) times daily., Disp: 60 tablet, Rfl: 2 .  ondansetron (ZOFRAN ODT) 4 MG disintegrating  tablet, Take 1 tablet (4 mg total) by mouth every 8 (eight) hours as needed for nausea or vomiting., Disp: 20 tablet, Rfl: 0 .  pantoprazole (PROTONIX) 40 MG tablet, Take 1 tablet (40 mg total) by mouth 2 (two) times daily., Disp: 60 tablet, Rfl: 1   Pertinent family medical history: family history includes Cancer in her maternal grandmother; Diabetes in her sister.   Allergies  Allergen Reactions  . Pork-Derived Products Hives and Swelling    Social History   Socioeconomic History  . Marital status: Single    Spouse name: Not on file  . Number of children: Not on file  . Years of education: Not on file  . Highest education level: Not on file  Occupational History  . Not on file  Social Needs  . Financial resource strain: Not on file  . Food insecurity:    Worry: Not on file    Inability: Not on file  . Transportation needs:    Medical: Not on file    Non-medical: Not on file  Tobacco Use  . Smoking status: Current Every Day Smoker    Packs/day: 0.25    Years: 14.00    Pack years: 3.50    Types: Cigarettes  . Smokeless tobacco: Never Used  Substance and Sexual Activity  . Alcohol use: Yes    Comment: occassionally on weekends  . Drug use: Yes    Types: Marijuana  . Sexual activity: Yes    Birth control/protection: Condom, None  Lifestyle  . Physical activity:  Days per week: Not on file    Minutes per session: Not on file  . Stress: Not on file  Relationships  . Social connections:    Talks on phone: Not on file    Gets together: Not on file    Attends religious service: Not on file    Active member of club or organization: Not on file    Attends meetings of clubs or organizations: Not on file    Relationship status: Not on file  . Intimate partner violence:    Fear of current or ex partner: Not on file    Emotionally abused: Not on file    Physically abused: Not on file    Forced sexual activity: Not on file  Other Topics Concern  . Not on file  Social  History Narrative  . Not on file    Review of Systems: Constitutional: Negative for fever, chills, diaphoresis, activity change, appetite change and fatigue. Respiratory: Negative for cough, choking, chest tightness, shortness of breath, wheezing and stridor.  Cardiovascular: Negative for chest pain, palpitations and leg swelling. Gastrointestinal: Positive for abdominal pain lower pelvic region. Genitourinary: Negative for dysuria, urgency, frequency, hematuria, flank pain, decreased urine volume, difficulty urinating. Musculoskeletal: Negative for back pain, joint swelling, arthralgia and gait problem. Neurological: Negative for dizziness, tremors, seizures, syncope, facial asymmetry, speech difficulty, weakness, light-headedness, numbness and headaches.  Hematological: Negative for adenopathy. Does not bruise/bleed easily. Psychiatric/Behavioral: Negative for hallucinations, behavioral problems, confusion, dysphoric mood, decreased concentration and agitation.  Objective:   Vitals:   12/21/17 0954  BP: 129/76  Pulse: 73  Resp: 16  Temp: (!) 97.4 F (36.3 C)  SpO2: 98%    BP Readings from Last 3 Encounters:  12/21/17 129/76  12/20/17 119/74  12/08/17 (!) 107/56    Filed Weights   12/21/17 0954  Weight: 134 lb 12.8 oz (61.1 kg)      Physical Exam: Constitutional: Patient appears well-developed and well-nourished. No distress. HENT: Normocephalic, atraumatic, External right and left ear normal. Oropharynx is clear and moist.  Eyes: Conjunctivae and EOM are normal. PERRLA, no scleral icterus. Neck: Normal ROM. Neck supple. No JVD. No tracheal deviation. No thyromegaly. CVS: RRR, S1/S2 +, no murmurs, no gallops, no carotid bruit.  Pulmonary: Effort and breath sounds normal, no stridor, rhonchi, wheezes, rales.  Abdominal: Soft. BS +. No distension. Positive for generalized lower abdominal tenderness with light palpation.  Guarding noted.    Musculoskeletal: Normal range of  motion. No edema and no tenderness.  Neuro: Alert. Normal muscle tone coordination. Normal gait. BUE and BLE strength 5/5.  Skin: Skin is warm and dry. No rash noted. Not diaphoretic. No erythema. No pallor. Psychiatric: Anxious mood and affect. Behavior, judgment, thought content normal. Lab Results (prior encounters)  Lab Results  Component Value Date   WBC 8.1 12/07/2017   HGB 10.8 (L) 12/07/2017   HCT 34.7 (L) 12/07/2017   MCV 77.3 (L) 12/07/2017   PLT 291 12/07/2017   Lab Results  Component Value Date   CREATININE 0.91 12/18/2017   BUN 9 12/18/2017   NA 139 12/18/2017   K 4.1 12/18/2017   CL 109 12/18/2017   CO2 24 12/18/2017    No results found for: HGBA1C  No results found for: CHOL, TRIG, HDL, CHOLHDL, VLDL, LDLCALC      Assessment and plan:  1. Hypercalcemia Plan is to discontinue Megace today, return to the office on Monday to have a CMP drawn.  Will evaluate calcium level with  patient being off of Megace for a total of 72 hours.  If calcium level is less than 11.5 will authorize surgery to proceed.  If calcium level remains elevated patient will need clearance from endocrinology to proceed with surgery. - CBC with Differential; Future - Comprehensive metabolic panel; Future - Intact PTH (Includes Calcium); Future  2. Uterine leiomyoma, unspecified location Pending surgical removal by OB/GYN Dr. Donn Pierini quantity of tramadol given for pain.    Orders Placed This Encounter  Procedures  . CBC with Differential    Standing Status:   Future    Number of Occurrences:   1    Standing Expiration Date:   12/22/2018  . Comprehensive metabolic panel    Standing Status:   Future    Number of Occurrences:   1    Standing Expiration Date:   12/22/2018  . Intact PTH (Includes Calcium)    Standing Status:   Future    Number of Occurrences:   1    Standing Expiration Date:   12/22/2018    A total of 35  minutes spent, greater than 50 % of this time was spent  counseling and coordination of care.     Return for Lab Appointment next Monday .   The patient was given clear instructions to go to ER or return to medical center if symptoms don't improve, worsen or new problems develop. The patient verbalized understanding. The patient was advised  to call and obtain lab results if they haven't heard anything from out office within 7-10 business days.  Molli Barrows, FNP Primary Care at [facility] [address] [place] [address] [phone]: [phone]       This note has been created with Dragon speech recognition software and Engineer, materials. Any transcriptional errors are unintentional.

## 2017-12-21 NOTE — Patient Instructions (Signed)
[facility] Monday to have labs completed.   [facility] [address], [place], [place] [address] [phone]     I should have the results for the following day and will follow-up.  Discontinue Megace until after labs have been drawn and completely discontinue stool softener.  Pending receipt of lab results and calcium level stable I will provide you with a surgical clearance letter.    Hypercalcemia Hypercalcemia is having too much calcium in the blood. The body needs calcium to make bones and keep them strong. Calcium also helps the muscles, nerves, brain, and heart work the way they should. Most of the calcium in the body is in the bones. There is also some calcium in the blood. Hypercalcemia can happen when calcium comes out of the bones, or when the kidneys are not able to remove calcium from the blood. Hypercalcemia can be mild or severe. What are the causes? There are many possible causes of hypercalcemia. Common causes include:  Hyperparathyroidism. This is a condition in which the body produces too much parathyroid hormone. There are four parathyroid glands in your neck. These glands produce a chemical messenger (hormone) that helps the body absorb calcium from foods and helps your bones release calcium.  Certain kinds of cancer, such as lung cancer, breast cancer, or myeloma.  Less common causes of hypercalcemia include:  Getting too much calcium or vitamin D from your diet.  Kidney failure.  Hyperthyroidism.  Being on bed rest for a long time.  Certain medicines.  Infections.  Sarcoidosis.  What increases the risk? This condition is more likely to develop in:  Women.  People who are 60 years or older.  People who have a family history of hypercalcemia.  What are the signs or symptoms? Mild hypercalcemia that starts slowly may not cause symptoms. Severe, sudden hypercalcemia is more likely to cause symptoms, such as:  Loss of  appetite.  Increased thirst and frequent urination.  Fatigue.  Nausea and vomiting.  Headache.  Abdominal pain.  Muscle pain, twitching, or weakness.  Constipation.  Blood in the urine.  Pain in the side of the back (flank pain).  Anxiety, confusion, or depression.  Irregular heartbeat (arrhythmia).  Loss of consciousness.  How is this diagnosed? This condition may be diagnosed based on:  Your symptoms.  Blood tests.  Urine tests.  X-rays.  Ultrasound.  MRI.  CT scan.  How is this treated? Treatment for hypercalcemia depends on the cause. Treatment may include:  Receiving fluids through an IV tube.  Medicines that keep calcium levels steady after receiving fluids (loop diuretics).  Medicines that keep calcium in your bones (bisphosphonates).  Medicines that lower the calcium level in your blood.  Surgery to remove overactive parathyroid glands.  Follow these instructions at home:  Take over-the-counter and prescription medicines only as told by your health care provider.  Follow instructions from your health care provider about eating or drinking restrictions.  Drink enough fluid to keep your urine clear or pale yellow.  Stay active. Weight-bearing exercise helps to keep calcium in your bones. Follow instructions from your health care provider about what type and level of exercise is safe for you.  Keep all follow-up visits as told by your health care provider. This is important. Contact a health care provider if:  You have a fever.  You have flank or abdominal pain that is getting worse. Get help right away if:  You have severe abdominal or flank pain.  You have chest pain.  You have trouble breathing.  You become very confused and sleepy.  You lose consciousness. This information is not intended to replace advice given to you by your health care provider. Make sure you discuss any questions you have with your health care  provider. Document Released: 04/29/2004 Document Revised: 07/22/2015 Document Reviewed: 07/01/2014 Elsevier Interactive Patient Education  2018 [employer].

## 2017-12-24 ENCOUNTER — Ambulatory Visit: Payer: Self-pay | Attending: Family Medicine

## 2017-12-24 MED FILL — FERROUS SULFATE 325 MG TAB: 325 (65 FE) | 30 days supply | Qty: 30 | Fill #0

## 2017-12-24 MED FILL — IBUPROFEN 800 MG TABLET: 800 | 10 days supply | Qty: 30 | Fill #0

## 2017-12-24 MED FILL — MEGESTROL 40 MG TABLET: 40 | 30 days supply | Qty: 60 | Fill #0

## 2017-12-25 LAB — COMPREHENSIVE METABOLIC PANEL
A/G RATIO: 1.7 (ref 1.2–2.2)
ALK PHOS: 148 IU/L — AB (ref 39–117)
ALT: 38 IU/L — ABNORMAL HIGH (ref 0–32)
AST: 22 IU/L (ref 0–40)
Albumin: 4.7 g/dL (ref 3.5–5.5)
BILIRUBIN TOTAL: 0.3 mg/dL (ref 0.0–1.2)
BUN/Creatinine Ratio: 10 (ref 9–23)
BUN: 9 mg/dL (ref 6–20)
CHLORIDE: 109 mmol/L — AB (ref 96–106)
CO2: 21 mmol/L (ref 20–29)
Calcium: 12.8 mg/dL — ABNORMAL HIGH (ref 8.7–10.2)
Creatinine, Ser: 0.9 mg/dL (ref 0.57–1.00)
GFR calc Af Amer: 94 mL/min/{1.73_m2} (ref >59)
GFR, EST NON AFRICAN AMERICAN: 81 mL/min/{1.73_m2} (ref >59)
GLOBULIN, TOTAL: 2.8 g/dL (ref 1.5–4.5)
Glucose: 86 mg/dL (ref 65–99)
POTASSIUM: 4.4 mmol/L (ref 3.5–5.2)
SODIUM: 142 mmol/L (ref 134–144)
Total Protein: 7.5 g/dL (ref 6.0–8.5)

## 2017-12-25 NOTE — Progress Notes (Signed)
Patient called & notified of her lab results & recommendations. She expressed understanding. Aware that surgical clearance will come from Endo.

## 2017-12-25 NOTE — Addendum Note (Signed)
Addended by: Scot Jun on: 12/25/2017 07:52 AM   Modules accepted: Orders

## 2017-12-25 NOTE — Progress Notes (Signed)
Patient notified of results & recommendations. Expressed understanding. Aware that surgical clearance will come from Endo.

## 2017-12-25 NOTE — Progress Notes (Signed)
Contact patient to advise of the following:  Recent labs indicate a persistently elevated calcium level in spite of cessation of Megace. I am referring patient to endocrinology for second opinion and surgical clearance. I am unable to clear patient medically for surgery in light of persistently elevation in calcium level. She has 100% [place] discount. Will refer to [facility] emergently for consulation.  Jeanette Caprice,  Please contact [facility] directly to see if patient can be scheduled for an appointment for evaluation of hypercalcinemia.

## 2017-12-25 NOTE — Progress Notes (Signed)
Called [facility] to see if they accept the 100% [place] Discount. Was notified that they did. Referral is in their workqueue & they will contact patient to set up an appt.

## 2017-12-26 ENCOUNTER — Telehealth: Payer: Self-pay | Admitting: *Deleted

## 2017-12-26 NOTE — Telephone Encounter (Signed)
Received a phone call from [facility] that they had a referral from [place] for this patient to have an uterine Artery Embolization and it was scheduled 12/06/17 . States she came in that day but they could not do it because her calcium level was too high.  They prescribed megace to see if that would bring her level down. They repeated her level and it was still too high.  States they recommended she see an endocrinologist but she declined because she is self pay. States then patient saw her PCP at [facility] who has checked her calcium level , still too high so [facility] was cancelled.  They can not  do [facility] without her seeing an endocrinologist and getting medical clearance for her elevated calcium level.  Per chart review she is scheduled to see [facility] tomorrow. I informed them I would send this message to Dr.Ervin.

## 2017-12-27 ENCOUNTER — Ambulatory Visit (INDEPENDENT_AMBULATORY_CARE_PROVIDER_SITE_OTHER): Payer: Self-pay | Admitting: Endocrinology

## 2017-12-27 ENCOUNTER — Encounter: Payer: Self-pay | Admitting: Endocrinology

## 2017-12-27 LAB — CBC WITH DIFFERENTIAL/PLATELET
BASOS: 1 %
Basophils Absolute: 0.1 10*3/uL (ref 0.0–0.2)
EOS (ABSOLUTE): 0.2 10*3/uL (ref 0.0–0.4)
Eos: 2 %
Hematocrit: 40.6 % (ref 34.0–46.6)
Hemoglobin: 12.1 g/dL (ref 11.1–15.9)
IMMATURE GRANULOCYTES: 0 %
Immature Grans (Abs): 0 10*3/uL (ref 0.0–0.1)
Lymphocytes Absolute: 3 10*3/uL (ref 0.7–3.1)
Lymphs: 36 %
MCH: 23.3 pg — AB (ref 26.6–33.0)
MCHC: 29.8 g/dL — ABNORMAL LOW (ref 31.5–35.7)
MCV: 78 fL — AB (ref 79–97)
MONOS ABS: 0.5 10*3/uL (ref 0.1–0.9)
Monocytes: 6 %
Neutrophils Absolute: 4.5 10*3/uL (ref 1.4–7.0)
Neutrophils: 55 %
PLATELETS: 357 10*3/uL (ref 150–450)
RBC: 5.19 x10E6/uL (ref 3.77–5.28)
RDW: 15.5 % — AB (ref 12.3–15.4)
WBC: 8.2 10*3/uL (ref 3.4–10.8)

## 2017-12-27 LAB — INTACT PTH (INCLUDES CALCIUM)
CALCIUM, SERUM: 12.6 mg/dL — AB
PTH (Intact Assay): 286 pg/mL — ABNORMAL HIGH

## 2017-12-27 NOTE — Progress Notes (Signed)
Subjective:    Patient ID: Katie Benson, female    DOB: 1980/08/12, 37 y.o.   MRN: [id number]  HPI Pt is referred by Molli Barrows, NP, for hypercalcemia.  Pt was noted to have hypercalcemia in 2011 (it was normal in 2010). she has never had osteoporosis, thyroid probs, sarcoidosis, cancer, PUD, pancreatitis, depression, or bony fracture.  she does not take vitamin-D or A supplements. Pt denies taking antacids, Li++, or HCTZ.  She had an episode of urolithiasis in 2012.  She has moderate pain at the mid-back, but no assoc numbness   Past Medical History:  Diagnosis Date  . Fibroids   . Medical history non-contributory   . Neck injury     Past Surgical History:  Procedure Laterality Date  . CESAREAN SECTION    . IR RADIOLOGIST EVAL & MGMT  10/17/2017  . IR RADIOLOGIST EVAL & MGMT  12/20/2017    Social History   Socioeconomic History  . Marital status: Single    Spouse name: Not on file  . Number of children: Not on file  . Years of education: Not on file  . Highest education level: Not on file  Occupational History  . Not on file  Social Needs  . Financial resource strain: Not on file  . Food insecurity:    Worry: Not on file    Inability: Not on file  . Transportation needs:    Medical: Not on file    Non-medical: Not on file  Tobacco Use  . Smoking status: Current Every Day Smoker    Packs/day: 0.25    Years: 14.00    Pack years: 3.50    Types: Cigarettes  . Smokeless tobacco: Never Used  Substance and Sexual Activity  . Alcohol use: Yes    Comment: occassionally on weekends  . Drug use: Yes    Types: Marijuana  . Sexual activity: Yes    Birth control/protection: Condom, None  Lifestyle  . Physical activity:    Days per week: Not on file    Minutes per session: Not on file  . Stress: Not on file  Relationships  . Social connections:    Talks on phone: Not on file    Gets together: Not on file    Attends religious service: Not on file    Active  member of club or organization: Not on file    Attends meetings of clubs or organizations: Not on file    Relationship status: Not on file  . Intimate partner violence:    Fear of current or ex partner: Not on file    Emotionally abused: Not on file    Physically abused: Not on file    Forced sexual activity: Not on file  Other Topics Concern  . Not on file  Social History Narrative  . Not on file    Current Outpatient Medications on File Prior to Visit  Medication Sig Dispense Refill  . Acetaminophen-Codeine (TYLENOL/CODEINE #3) 300-30 MG tablet Take 1 tablet by mouth every 4 (four) hours as needed for pain. 16 tablet 0  . docusate sodium (COLACE) 250 MG capsule Take 1 capsule (250 mg total) by mouth daily. (Patient taking differently: Take 250 mg by mouth daily as needed for constipation. ) 10 capsule 0  . ferrous sulfate 325 (65 FE) MG tablet Take 1 tablet (325 mg total) by mouth daily. 30 tablet 5  . megestrol (MEGACE) 40 MG tablet Take 1 tablet (40 mg total) by mouth 2 (two)  times daily. 60 tablet 2  . ondansetron (ZOFRAN ODT) 4 MG disintegrating tablet Take 1 tablet (4 mg total) by mouth every 8 (eight) hours as needed for nausea or vomiting. 20 tablet 0  . pantoprazole (PROTONIX) 40 MG tablet Take 1 tablet (40 mg total) by mouth 2 (two) times daily. 60 tablet 1   No current facility-administered medications on file prior to visit.     Allergies  Allergen Reactions  . Pork-Derived Products Hives and Swelling    Family History  Problem Relation Age of Onset  . Diabetes Sister   . Cancer Maternal Grandmother        ovarian  . Hypercalcemia Mother     BP 110/60 (BP Location: Left Arm, Patient Position: Sitting, Cuff Size: Normal)   Pulse 94   Ht 5\' 3"  (1.6 m)   Wt 139 lb 6.4 oz (63.2 kg)   SpO2 98%   BMI 24.69 kg/m    Review of Systems Denies visual loss, cough, edema, diarrhea, sore throat, hematuria, and rash. She has mild depression.  She has heavy menses (flet  due to fibroids).  She has slight weight gain and frequent urination.        Objective:   Physical Exam VS: see vs page GEN: no distress HEAD: head: no deformity eyes: no periorbital swelling, no proptosis external nose and ears are normal mouth: no lesion seen NECK: supple, thyroid is not enlarged CHEST WALL: no deformity.  No kyphosis LUNGS: clear to auscultation CV: reg rate and rhythm, no murmur ABD: abdomen is soft, nontender.  no hepatosplenomegaly.  not distended.  no hernia MUSCULOSKELETAL: muscle bulk and strength are grossly normal.  no obvious joint swelling.  gait is normal and steady EXTEMITIES: no deformity.  no edema PULSES: no carotid bruit NEURO:  cn 2-12 grossly intact.   readily moves all 4's.  sensation is intact to touch on all 4's SKIN:  Normal texture and temperature.  No rash or suspicious lesion is visible.   NODES:  None palpable at the neck PSYCH: alert, well-oriented.  Does not appear anxious nor depressed.    PTHRP is <2 25-OH vit-D=7.6%  Lab Results  Component Value Date   PTH 46 12/06/2017   PTH Comment 12/06/2017   CALCIUM 12.8 (H) 12/24/2017   PHOS 1.6 (L) 12/08/2017   Lab Results  Component Value Date   ALT 38 (H) 12/24/2017   AST 22 12/24/2017   ALKPHOS 148 (H) 12/24/2017   BILITOT 0.3 12/24/2017   Intact PTH=286  I have reviewed outside records, and summarized: Pt was noted to have elevated Ca++, and referred here.  This was noted most recently in preop eval for surg for uterine fibroids      Assessment & Plan:  Hyperparathyroidism: she has a combination of primary and secondary Vit-D def, new to me: we'll replete cautiously, in view of this degree of hypercalcemia  Patient Instructions  blood tests are requested for you today.  We'll let you know about the results.   Please see a surgery specialist.  you will receive a phone call, about a day and time for an appointment.

## 2017-12-27 NOTE — Patient Instructions (Addendum)
blood tests are requested for you today.  We'll let you know about the results.   Please see a surgery specialist.  you will receive a phone call, about a day and time for an appointment.

## 2017-12-31 LAB — PTH, INTACT AND CALCIUM
Calcium: 11.7 mg/dL — ABNORMAL HIGH (ref 8.6–10.2)
PTH: 215 pg/mL — ABNORMAL HIGH (ref 14–64)

## 2017-12-31 LAB — VITAMIN D 1,25 DIHYDROXY
VITAMIN D 1, 25 (OH) TOTAL: 69 pg/mL (ref 18–72)
Vitamin D2 1, 25 (OH)2: <8 pg/mL
Vitamin D3 1, 25 (OH)2: 69 pg/mL

## 2017-12-31 LAB — VITAMIN A: Vitamin A (Retinoic Acid): 48 ug/dL (ref 38–98)

## 2017-12-31 LAB — PROLACTIN: PROLACTIN: 13.5 ng/mL

## 2018-01-01 ENCOUNTER — Telehealth: Payer: Self-pay | Admitting: Endocrinology

## 2018-01-01 NOTE — Telephone Encounter (Signed)
Patient is stating the referral that was sent for surgery is not going to take her insurance, they want her to pay out of pocket for her consultation and surgery. Patient would like to see someone who accepts the orange card. Please Advise.

## 2018-01-01 NOTE — Telephone Encounter (Signed)
Hi Stephanie,  I am not the best person to ask who is a covered provider for the [employer]. My concern would be....whomever processes our referrals, is this something they would generally look in to BEFORE scheduling a pt? Going forward, what is the best process for handling this type of call/referral/question?

## 2018-01-03 NOTE — Telephone Encounter (Signed)
Do you know what if anything we can do about this referral- please advise

## 2018-01-04 NOTE — Telephone Encounter (Signed)
Patients referral has been forwarded to [facility] now pt is aware they do accept the [employer]

## 2018-01-07 ENCOUNTER — Encounter: Payer: Self-pay | Admitting: General Surgery

## 2018-01-07 ENCOUNTER — Other Ambulatory Visit: Payer: Self-pay

## 2018-01-07 ENCOUNTER — Ambulatory Visit: Payer: Self-pay

## 2018-01-07 ENCOUNTER — Ambulatory Visit (INDEPENDENT_AMBULATORY_CARE_PROVIDER_SITE_OTHER): Payer: Self-pay | Admitting: General Surgery

## 2018-01-07 DIAGNOSIS — E210 Primary hyperparathyroidism: Secondary | ICD-10-CM

## 2018-01-07 DIAGNOSIS — E211 Secondary hyperparathyroidism, not elsewhere classified: Secondary | ICD-10-CM

## 2018-01-07 MED ORDER — VITAMIN D (ERGOCALCIFEROL) 1.25 MG (50000 UNIT) PO CAPS: 50000.0000 [IU] | ORAL_CAPSULE | ORAL | 1 refills | Status: DC

## 2018-01-07 MED FILL — VIT D2 1.25 MG (50,000 UNIT: 1.25 MG | 28 days supply | Qty: 8 | Fill #0

## 2018-01-07 NOTE — Progress Notes (Signed)
Patient ID: Chanay Nugent, female   DOB: March 30, 1980, 37 y.o.   MRN: [id number]  Chief Complaint  Patient presents with  . New Patient (Initial Visit)    Hypercalceimeia    HPI Danese Dorsainvil is a 37 y.o. female.   She was referred by Dr. Loanne Drilling (Endocrinology) for surgical evaluation of hyperparathyroidism.  Ms. Cargile has a history of abnormal uterine bleeding and was preparing to undergo uterine artery embolization. Pre-procedure lab work revealed a markedly elevated calcium level (13 mg/dL). She was admitted to the hospital and treated with IV hydration and calcitonin. She was discharged with a calcium level of 10.2, however on recheck labs a week later, she was again hypercalcemic.  She was referred to endocrinology for further evaluation.  She was felt to have primary hyperparathyroidism, with a secondary component due to vitamin D deficiency. She was instructed to start taking OTC vitamin D, however she has not yet started.     She complains of back pain, fatigue, abdominal discomfort, nausea, fatigue, depression, and weight gain. She does have GERD, currently being treated with a PPI. She had an episode of nephrolithiasis about 7 years ago. She denies constipation, long bone pain, pancreatitis, or pathologic fracture.  She endorses foggy thinking, polydipsia, nocturia 6-7 times/night, sleep disturbances. Her mother had calcium issues, but the patient isn't sure of the etiology.   Past Medical History:  Diagnosis Date  . Fibroids   . Medical history non-contributory   . Neck injury     Past Surgical History:  Procedure Laterality Date  . CESAREAN SECTION    . IR RADIOLOGIST EVAL & MGMT  10/17/2017  . IR RADIOLOGIST EVAL & MGMT  12/20/2017    Family History  Problem Relation Age of Onset  . Diabetes Sister   . Cancer Maternal Grandmother        ovarian  . Hypercalcemia Mother     Social History Social History   Tobacco Use  . Smoking status: Current Every Day  Smoker    Packs/day: 0.25    Years: 14.00    Pack years: 3.50    Types: Cigarettes  . Smokeless tobacco: Never Used  Substance Use Topics  . Alcohol use: Yes    Comment: occassionally on weekends  . Drug use: Yes    Types: Marijuana    Allergies  Allergen Reactions  . Pork-Derived Products Hives and Swelling    Current Outpatient Medications  Medication Sig Dispense Refill  . Acetaminophen-Codeine (TYLENOL/CODEINE #3) 300-30 MG tablet Take 1 tablet by mouth every 4 (four) hours as needed for pain. 16 tablet 0  . ferrous sulfate 325 (65 FE) MG tablet Take 1 tablet (325 mg total) by mouth daily. 30 tablet 5  . megestrol (MEGACE) 40 MG tablet Take 1 tablet (40 mg total) by mouth 2 (two) times daily. 60 tablet 2  . ondansetron (ZOFRAN ODT) 4 MG disintegrating tablet Take 1 tablet (4 mg total) by mouth every 8 (eight) hours as needed for nausea or vomiting. 20 tablet 0  . pantoprazole (PROTONIX) 40 MG tablet Take 1 tablet (40 mg total) by mouth 2 (two) times daily. 60 tablet 1  . Vitamin D, Ergocalciferol, (DRISDOL) 1.25 MG (50000 UT) CAPS capsule Take 1 capsule (50,000 Units total) by mouth 2 (two) times a week. Take on Mondays and Thursdays. 8 capsule 1   No current facility-administered medications for this visit.     Review of Systems Review of Systems  Constitutional: Positive for appetite change and fatigue.  Weight gain since being on Megace; "always hungry"  HENT: Negative.   Eyes: Positive for visual disturbance.       Blurred vision, especially when fatigued.  Respiratory: Positive for shortness of breath.   Cardiovascular: Positive for palpitations.  Gastrointestinal: Positive for abdominal pain and nausea.  Musculoskeletal: Positive for back pain.  Skin: Negative.   Allergic/Immunologic: Negative.   Neurological: Positive for headaches.  Hematological: Negative.   Psychiatric/Behavioral: Positive for dysphoric mood.  All other systems reviewed and are  negative.   Blood pressure 124/83, pulse 75, temperature (!) 96.8 F (36 C), temperature source Temporal, resp. rate 16, height 5\' 3"  (1.6 m), weight 141 lb 9.6 oz (64.2 kg). Vitals:   01/07/18 1416  BP: 124/83  Pulse: 75  Resp: 16  Temp: (!) 96.8 F (36 C)    Physical Exam Physical Exam  Constitutional: She is oriented to person, place, and time. She appears well-developed and well-nourished. No distress.  HENT:  Head: Normocephalic and atraumatic.  Mouth/Throat: Oropharynx is clear and moist. No oropharyngeal exudate.  Eyes: Pupils are equal, round, and reactive to light. Conjunctivae are normal. No scleral icterus.  Neck: Normal range of motion. No tracheal deviation present. No thyromegaly present.  Cardiovascular: Normal rate, regular rhythm, normal heart sounds and intact distal pulses.  Pulmonary/Chest: Effort normal and breath sounds normal.  Abdominal: Soft. Bowel sounds are normal.  Musculoskeletal: She exhibits no edema or deformity.  Lymphadenopathy:    She has no cervical adenopathy.  Neurological: She is alert and oriented to person, place, and time.  Skin: Skin is warm and dry.  Psychiatric: She has a normal mood and affect. Thought content normal.    Data Reviewed  Results for LIDIE, GLADE (MRN [id number]) as of 01/08/2018 14:31  Ref. Range 12/06/2017 09:04 12/06/2017 10:23 12/06/2017 13:43 12/07/2017 05:44 12/07/2017 11:02 12/08/2017 05:38 12/08/2017 05:43 12/18/2017 14:20 12/24/2017 09:41 12/24/2017 09:45 12/27/2017 08:12  Calcium Latest Ref Range: 8.6 - 10.2 mg/dL 13.1 (HH) 13.0 (H)  12.3 (H)  10.4 (H) 10.2 12.4 (H)  12.8 (H) 11.7 (H)  Creatinine Latest Ref Range: 0.57 - 1.00 mg/dL 0.83 0.83  0.75  0.71 0.65 0.91  0.90   Phosphorus Latest Ref Range: 2.5 - 4.6 mg/dL       1.6 (L)      Alkaline Phosphatase Latest Ref Range: 39 - 117 IU/L          148 (H)   GFR, Est African American Latest Ref Range: >59 mL/min/1.73 >60 >60  >60  >60 >60 93  94   Calcium,  Serum Latest Units: mg/dL         12.6 (H)    Vitamin D, 25-Hydroxy Latest Ref Range: 30.0 - 100.0 ng/mL     7.6 (L)        Vitamin D3 1, 25 (OH) Latest Units: pg/mL           69  Vitamin D2 1, 25 (OH) Latest Units: pg/mL           <8  Vitamin D 1, 25 (OH) Total Latest Ref Range: 18 - 72 pg/mL           69  PTH, Intact Latest Ref Range: 14 - 64 pg/mL   46        215 (H)  Calcium, Total (PTH) Latest Ref Range: 8.7 - 10.2 mg/dL   11.7 (H)          PTH-related peptide Latest Units: pmol/L     <  2.0        PTH (Intact Assay) Latest Units: pg/mL         286 (H)     Ultrasound performed in clinic today. See separate documentation. 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     Assessment    37 y/o F with hypercalcemia due to what appears to be mainly primary hyperparathyroidism. There is also likely a secondary component due to extremely low 25-OH vitamin D level.  She meets criteria for surgical intervention based on age and degree of calcium elevation, in addition to her symptoms.  She is eager to proceed with surgery, however I am concerned that she will have significant difficulty with post-op hypocalcemia if she does not replete her vitamin D level.      Plan    The risks of parathyroid surgery were discussed with the patient, including (but not limited to): bleeding, infection, damage to surrounding structures/tissues, injury (temporary or permanent) to the recurrent laryngeal nerve, hypocalcemia (temporary or permanent), need to take calcium and/or vitamin D supplementation, recurrent hyperparathyroidism, failure to correct hyperparathyroidism, need for additional surgery.  The patient had the opportunity to ask any questions and these were answered to their satisfaction.  Start ergocalciferol 50,000 IU twice weekly (Mondays and Thursdays). Will plan for surgery in a few weeks.  Due to my concern for severe post-op hypocalcemia, will plan overnight stay in hospital to  monitor.       Fredirick Maudlin 01/07/2018, 4:40 PM

## 2018-01-07 NOTE — Patient Instructions (Addendum)
Please start Vitamin D today.  The patient is scheduled for surgery at [facility] with Dr Celine Ahr on 02/01/18. She will pre admit by phone. The patient is aware of date and instructions.

## 2018-01-08 ENCOUNTER — Encounter: Payer: Self-pay | Admitting: General Surgery

## 2018-01-08 DIAGNOSIS — E210 Primary hyperparathyroidism: Secondary | ICD-10-CM | POA: Insufficient documentation

## 2018-01-08 DIAGNOSIS — E211 Secondary hyperparathyroidism, not elsewhere classified: Secondary | ICD-10-CM | POA: Insufficient documentation

## 2018-01-09 ENCOUNTER — Telehealth: Payer: Self-pay

## 2018-01-09 NOTE — Telephone Encounter (Signed)
Call to patient to see about rescheduling surgery per Dr Celine Ahr. The patient is now scheduled for surgery at [facility] with Dr Celine Ahr on 02/12/18. She is aware to call the day before to get her arrival time.

## 2018-01-11 MED FILL — PANTOPRAZOLE SOD DR 40 MG T: 40 | 30 days supply | Qty: 60 | Fill #1

## 2018-01-17 ENCOUNTER — Other Ambulatory Visit: Payer: Self-pay | Admitting: General Surgery

## 2018-01-17 DIAGNOSIS — E210 Primary hyperparathyroidism: Secondary | ICD-10-CM

## 2018-01-23 ENCOUNTER — Other Ambulatory Visit: Payer: Self-pay

## 2018-02-01 ENCOUNTER — Other Ambulatory Visit: Payer: Self-pay

## 2018-02-01 ENCOUNTER — Encounter
Admission: RE | Admit: 2018-02-01 | Discharge: 2018-02-01 | Disposition: A | Discharge status: Home / Self Care | Payer: Self-pay | Source: Ambulatory Visit | Attending: General Surgery | Admitting: General Surgery

## 2018-02-01 HISTORY — DX: Gastro-esophageal reflux disease without esophagitis: K21.9

## 2018-02-01 HISTORY — DX: Personal history of urinary calculi: Z87.442

## 2018-02-01 HISTORY — DX: Anemia, unspecified: D64.9

## 2018-02-01 NOTE — Patient Instructions (Signed)
Your procedure is scheduled on: 02-12-18  Report to Same Day Surgery 2nd floor medical mall [facility] Entrance-take elevator on left to 2nd floor.  Check in with surgery information desk.) To find out your arrival time please call [phone] between 1PM - 3PM on 02-11-18  Remember: Instructions that are not followed completely may result in serious medical risk, up to and including death, or upon the discretion of your surgeon and anesthesiologist your surgery may need to be rescheduled.    _x___ 1. Do not eat food after midnight the night before your procedure. You may drink clear liquids up to 2 hours before you are scheduled to arrive at the hospital for your procedure.  Do not drink clear liquids within 2 hours of your scheduled arrival to the hospital.  Clear liquids include  --Water or Apple juice without pulp  --Clear carbohydrate beverage such as ClearFast or Gatorade  --Black Coffee or Clear Tea (No milk, no creamers, do not add anything to the coffee or Tea   ____Ensure clear carbohydrate drink on the way to the hospital for bariatric patients  ____Ensure clear carbohydrate drink 3 hours before surgery for Dr Dwyane Luo patients if physician instructed.   No gum chewing or hard candies.     __x__ 2. No Alcohol for 24 hours before or after surgery.   __x__3. No Smoking or e-cigarettes for 24 prior to surgery.  Do not use any chewable tobacco products for at least 6 hour prior to surgery   ____  4. Bring all medications with you on the day of surgery if instructed.    __x__ 5. Notify your doctor if there is any change in your medical condition     (cold, fever, infections).    x___6. On the morning of surgery brush your teeth with toothpaste and water.  You may rinse your mouth with mouth wash if you wish.  Do not swallow any toothpaste or mouthwash.   Do not wear jewelry, make-up, hairpins, clips or nail polish.  Do not wear lotions, powders, or perfumes. You may wear  deodorant.  Do not shave 48 hours prior to surgery. Men may shave face and neck.  Do not bring valuables to the hospital.    [facility] is not responsible for any belongings or valuables.               Contacts, dentures or bridgework may not be worn into surgery.  Leave your suitcase in the car. After surgery it may be brought to your room.  For patients admitted to the hospital, discharge time is determined by your treatment team.  _  Patients discharged the day of surgery will not be allowed to drive home.  You will need someone to drive you home and stay with you the night of your procedure.    Please read over the following fact sheets that you were given:   [facility] Preparing for Surgery and or MRSA Information   _x___ TAKE THE FOLLOWING MEDICATION THE MORNING OF SURGERY WITH A SMALL SIP OF WATER. These include:  1. PROTONIX  2.  3.  4.  5.  6.  ____Fleets enema or Magnesium Citrate as directed.   ____ Use CHG Soap or sage wipes as directed on instruction sheet   ____ Use inhalers on the day of surgery and bring to hospital day of surgery  ____ Stop Metformin and Janumet 2 days prior to surgery.    ____ Take 1/2 of usual insulin  dose the night before surgery and none on the morning     surgery.   ____ Follow recommendations from Cardiologist, Pulmonologist or PCP regarding stopping Aspirin, Coumadin, Plavix ,Eliquis, Effient, or Pradaxa, and Pletal.  X____Stop Anti-inflammatories such as Advil, Aleve, Ibuprofen, Motrin, Naproxen, Naprosyn, Goodies powders or aspirin products 7 DAYS PRIOR TO SURGERY-OK to take Tylenol    ____ Stop supplements until after surgery.     ____ Bring C-Pap to the hospital.

## 2018-02-11 MED FILL — MEGESTROL 40 MG TABLET: 40 | 30 days supply | Qty: 60 | Fill #1

## 2018-02-11 MED FILL — VIT D2 1.25 MG (50,000 UNIT: 1.25 MG | 28 days supply | Qty: 8 | Fill #1

## 2018-02-11 MED FILL — IBUPROFEN 800 MG TABLET: 800 | 10 days supply | Qty: 30 | Fill #1

## 2018-02-12 ENCOUNTER — Ambulatory Visit: Payer: Self-pay | Admitting: Anesthesiology

## 2018-02-12 ENCOUNTER — Other Ambulatory Visit: Payer: Self-pay

## 2018-02-12 ENCOUNTER — Encounter
Admission: RE | Disposition: A | Discharge status: Home / Self Care | Payer: Self-pay | Source: Ambulatory Visit | Attending: General Surgery

## 2018-02-12 ENCOUNTER — Observation Stay
Admission: RE | Admit: 2018-02-12 | Discharge: 2018-02-13 | Disposition: A | Discharge status: Home / Self Care | Payer: Self-pay | Source: Ambulatory Visit | Attending: General Surgery | Admitting: General Surgery

## 2018-02-12 ENCOUNTER — Encounter: Payer: Self-pay | Admitting: *Deleted

## 2018-02-12 DIAGNOSIS — E210 Primary hyperparathyroidism: Secondary | ICD-10-CM

## 2018-02-12 DIAGNOSIS — R110 Nausea: Secondary | ICD-10-CM | POA: Insufficient documentation

## 2018-02-12 DIAGNOSIS — N939 Abnormal uterine and vaginal bleeding, unspecified: Secondary | ICD-10-CM | POA: Insufficient documentation

## 2018-02-12 DIAGNOSIS — E892 Postprocedural hypoparathyroidism: Secondary | ICD-10-CM

## 2018-02-12 DIAGNOSIS — Z79899 Other long term (current) drug therapy: Secondary | ICD-10-CM | POA: Insufficient documentation

## 2018-02-12 DIAGNOSIS — E559 Vitamin D deficiency, unspecified: Secondary | ICD-10-CM | POA: Insufficient documentation

## 2018-02-12 DIAGNOSIS — G479 Sleep disorder, unspecified: Secondary | ICD-10-CM | POA: Insufficient documentation

## 2018-02-12 DIAGNOSIS — R5383 Other fatigue: Secondary | ICD-10-CM | POA: Insufficient documentation

## 2018-02-12 DIAGNOSIS — R109 Unspecified abdominal pain: Secondary | ICD-10-CM | POA: Insufficient documentation

## 2018-02-12 DIAGNOSIS — F329 Major depressive disorder, single episode, unspecified: Secondary | ICD-10-CM | POA: Insufficient documentation

## 2018-02-12 DIAGNOSIS — M549 Dorsalgia, unspecified: Secondary | ICD-10-CM | POA: Insufficient documentation

## 2018-02-12 DIAGNOSIS — D351 Benign neoplasm of parathyroid gland: Principal | ICD-10-CM | POA: Insufficient documentation

## 2018-02-12 DIAGNOSIS — F17210 Nicotine dependence, cigarettes, uncomplicated: Secondary | ICD-10-CM | POA: Insufficient documentation

## 2018-02-12 DIAGNOSIS — Z833 Family history of diabetes mellitus: Secondary | ICD-10-CM | POA: Insufficient documentation

## 2018-02-12 DIAGNOSIS — R351 Nocturia: Secondary | ICD-10-CM | POA: Insufficient documentation

## 2018-02-12 DIAGNOSIS — Z8041 Family history of malignant neoplasm of ovary: Secondary | ICD-10-CM | POA: Insufficient documentation

## 2018-02-12 DIAGNOSIS — E211 Secondary hyperparathyroidism, not elsewhere classified: Secondary | ICD-10-CM | POA: Insufficient documentation

## 2018-02-12 DIAGNOSIS — Z9889 Other specified postprocedural states: Secondary | ICD-10-CM

## 2018-02-12 DIAGNOSIS — Z8489 Family history of other specified conditions: Secondary | ICD-10-CM | POA: Insufficient documentation

## 2018-02-12 DIAGNOSIS — Z87442 Personal history of urinary calculi: Secondary | ICD-10-CM | POA: Insufficient documentation

## 2018-02-12 DIAGNOSIS — K219 Gastro-esophageal reflux disease without esophagitis: Secondary | ICD-10-CM | POA: Insufficient documentation

## 2018-02-12 HISTORY — PX: PARATHYROIDECTOMY: SHX19

## 2018-02-12 LAB — URINE DRUG SCREEN, QUALITATIVE (ARMC ONLY)
Amphetamines, Ur Screen: NOT DETECTED
Barbiturates, Ur Screen: NOT DETECTED
Benzodiazepine, Ur Scrn: NOT DETECTED
COCAINE METABOLITE, UR ~~LOC~~: NOT DETECTED
Cannabinoid 50 Ng, Ur ~~LOC~~: POSITIVE — AB
MDMA (Ecstasy)Ur Screen: NOT DETECTED
Methadone Scn, Ur: NOT DETECTED
Opiate, Ur Screen: NOT DETECTED
Phencyclidine (PCP) Ur S: NOT DETECTED
Tricyclic, Ur Screen: NOT DETECTED

## 2018-02-12 LAB — CALCIUM
Calcium: 11.1 mg/dL — ABNORMAL HIGH (ref 8.9–10.3)
Calcium: 11.6 mg/dL — ABNORMAL HIGH (ref 8.9–10.3)
Calcium: 11.1 mg/dL — ABNORMAL HIGH (ref 8.9–10.3)

## 2018-02-12 LAB — ALBUMIN
Albumin: 4.1 g/dL (ref 3.5–5.0)
Albumin: 4.2 g/dL (ref 3.5–5.0)
Albumin: 3.9 g/dL (ref 3.5–5.0)

## 2018-02-12 SURGERY — PARATHYROIDECTOMY
Anesthesia: General

## 2018-02-12 MED ORDER — SUCCINYLCHOLINE CHLORIDE 20 MG/ML IJ SOLN
INTRAMUSCULAR | Status: AC
  Filled 2018-02-12: qty 1

## 2018-02-12 MED ORDER — FERROUS SULFATE 325 (65 FE) MG PO TABS
325.0000 mg | ORAL_TABLET | Freq: Every day | ORAL | Status: DC
  Administered 2018-02-12 – 2018-02-13 (×2): 325 mg via ORAL
  Filled 2018-02-12 (×2): qty 1

## 2018-02-12 MED ORDER — CHLORHEXIDINE GLUCONATE CLOTH 2 % EX PADS: 6.0000 | MEDICATED_PAD | Freq: Once | CUTANEOUS | Status: DC

## 2018-02-12 MED ORDER — PROPOFOL 10 MG/ML IV BOLUS
INTRAVENOUS | Status: AC
  Filled 2018-02-12: qty 20

## 2018-02-12 MED ORDER — EVICEL 2 ML EX KIT
PACK | CUTANEOUS | Status: AC
  Filled 2018-02-12: qty 1

## 2018-02-12 MED ORDER — LACTATED RINGERS IV SOLN
INTRAVENOUS | Status: DC
  Administered 2018-02-12: 12:00:00 via INTRAVENOUS

## 2018-02-12 MED ORDER — DEXAMETHASONE SODIUM PHOSPHATE 10 MG/ML IJ SOLN
INTRAMUSCULAR | Status: AC
  Filled 2018-02-12: qty 1

## 2018-02-12 MED ORDER — SUGAMMADEX SODIUM 200 MG/2ML IV SOLN
INTRAVENOUS | Status: AC
  Filled 2018-02-12: qty 2

## 2018-02-12 MED ORDER — CALCIUM CARBONATE ANTACID 500 MG PO CHEW
400.0000 mg | CHEWABLE_TABLET | Freq: Two times a day (BID) | ORAL | Status: DC
  Administered 2018-02-13: 400 mg via ORAL
  Filled 2018-02-12: qty 2

## 2018-02-12 MED ORDER — CALCIUM CARBONATE ANTACID 500 MG PO CHEW
5.0000 | CHEWABLE_TABLET | Freq: Three times a day (TID) | ORAL | Status: DC
  Administered 2018-02-12: 1000 mg via ORAL
  Filled 2018-02-12: qty 5

## 2018-02-12 MED ORDER — CELECOXIB 200 MG PO CAPS
200.0000 mg | ORAL_CAPSULE | ORAL | Status: AC
  Administered 2018-02-12: 200 mg via ORAL

## 2018-02-12 MED ORDER — ROCURONIUM BROMIDE 100 MG/10ML IV SOLN
INTRAVENOUS | Status: DC | PRN
  Administered 2018-02-12: 25 mg via INTRAVENOUS
  Administered 2018-02-12: 5 mg via INTRAVENOUS
  Administered 2018-02-12: 20 mg via INTRAVENOUS

## 2018-02-12 MED ORDER — MEGESTROL ACETATE 20 MG PO TABS
40.0000 mg | ORAL_TABLET | Freq: Two times a day (BID) | ORAL | Status: DC
  Administered 2018-02-12 – 2018-02-13 (×3): 40 mg via ORAL
  Filled 2018-02-12 (×4): qty 2

## 2018-02-12 MED ORDER — SUCCINYLCHOLINE CHLORIDE 20 MG/ML IJ SOLN
INTRAMUSCULAR | Status: DC | PRN
  Administered 2018-02-12: 100 mg via INTRAVENOUS

## 2018-02-12 MED ORDER — HYDROMORPHONE HCL 1 MG/ML IJ SOLN
0.2500 mg | INTRAMUSCULAR | Status: DC | PRN
  Administered 2018-02-12 (×7): 0.25 mg via INTRAVENOUS

## 2018-02-12 MED ORDER — MIDAZOLAM HCL 2 MG/2ML IJ SOLN
INTRAMUSCULAR | Status: DC | PRN
  Administered 2018-02-12: 2 mg via INTRAVENOUS

## 2018-02-12 MED ORDER — HYDROCODONE-ACETAMINOPHEN 5-325 MG PO TABS
1.0000 | ORAL_TABLET | ORAL | Status: DC | PRN
  Administered 2018-02-12: 1 via ORAL
  Administered 2018-02-12: 2 via ORAL
  Administered 2018-02-12: 1 via ORAL
  Administered 2018-02-13 (×4): 2 via ORAL
  Filled 2018-02-12 (×3): qty 2
  Filled 2018-02-12 (×2): qty 1
  Filled 2018-02-12 (×2): qty 2

## 2018-02-12 MED ORDER — ACETAMINOPHEN 500 MG PO TABS
1000.0000 mg | ORAL_TABLET | ORAL | Status: AC
  Administered 2018-02-12: 1000 mg via ORAL

## 2018-02-12 MED ORDER — SUGAMMADEX SODIUM 200 MG/2ML IV SOLN
INTRAVENOUS | Status: DC | PRN
  Administered 2018-02-12: 400 mg via INTRAVENOUS

## 2018-02-12 MED ORDER — HYDROMORPHONE HCL 1 MG/ML IJ SOLN
INTRAMUSCULAR | Status: AC
  Administered 2018-02-12: 0.25 mg via INTRAVENOUS
  Filled 2018-02-12: qty 1

## 2018-02-12 MED ORDER — EVICEL 2 ML EX KIT
PACK | CUTANEOUS | Status: DC | PRN
  Administered 2018-02-12: 2 mL

## 2018-02-12 MED ORDER — PANTOPRAZOLE SODIUM 40 MG PO TBEC
40.0000 mg | DELAYED_RELEASE_TABLET | Freq: Two times a day (BID) | ORAL | Status: DC
  Administered 2018-02-12 – 2018-02-13 (×3): 40 mg via ORAL
  Filled 2018-02-12 (×3): qty 1

## 2018-02-12 MED ORDER — LIDOCAINE HCL (PF) 2 % IJ SOLN
INTRAMUSCULAR | Status: AC
  Filled 2018-02-12: qty 10

## 2018-02-12 MED ORDER — FAMOTIDINE 20 MG PO TABS
ORAL_TABLET | ORAL | Status: AC
  Administered 2018-02-12: 20 mg
  Filled 2018-02-12: qty 1

## 2018-02-12 MED ORDER — PROPOFOL 10 MG/ML IV BOLUS
INTRAVENOUS | Status: DC | PRN
  Administered 2018-02-12: 150 mg via INTRAVENOUS

## 2018-02-12 MED ORDER — CELECOXIB 200 MG PO CAPS
ORAL_CAPSULE | ORAL | Status: AC
  Filled 2018-02-12: qty 1

## 2018-02-12 MED ORDER — ONDANSETRON HCL 4 MG/2ML IJ SOLN
INTRAMUSCULAR | Status: DC | PRN
  Administered 2018-02-12: 4 mg via INTRAVENOUS

## 2018-02-12 MED ORDER — FENTANYL CITRATE (PF) 100 MCG/2ML IJ SOLN
INTRAMUSCULAR | Status: DC | PRN
  Administered 2018-02-12 (×2): 50 ug via INTRAVENOUS

## 2018-02-12 MED ORDER — LIDOCAINE HCL (CARDIAC) PF 100 MG/5ML IV SOSY
PREFILLED_SYRINGE | INTRAVENOUS | Status: DC | PRN
  Administered 2018-02-12: 70 mg via INTRAVENOUS

## 2018-02-12 MED ORDER — MENTHOL 3 MG MT LOZG
1.0000 | LOZENGE | OROMUCOSAL | Status: DC | PRN
  Administered 2018-02-12 – 2018-02-13 (×10): 3 mg via ORAL
  Filled 2018-02-12 (×3): qty 9

## 2018-02-12 MED ORDER — CALCITRIOL 0.25 MCG PO CAPS
0.2500 ug | ORAL_CAPSULE | Freq: Two times a day (BID) | ORAL | Status: DC
  Administered 2018-02-12 – 2018-02-13 (×3): 0.25 ug via ORAL
  Filled 2018-02-12 (×5): qty 1

## 2018-02-12 MED ORDER — ACETAMINOPHEN 500 MG PO TABS
ORAL_TABLET | ORAL | Status: AC
  Filled 2018-02-12: qty 2

## 2018-02-12 MED ORDER — PROMETHAZINE HCL 25 MG/ML IJ SOLN: 6.2500 mg | INTRAMUSCULAR | Status: DC | PRN

## 2018-02-12 MED ORDER — CALCIUM CARBONATE ANTACID 500 MG PO CHEW
400.0000 mg | CHEWABLE_TABLET | Freq: Three times a day (TID) | ORAL | Status: DC
  Administered 2018-02-12: 400 mg via ORAL
  Filled 2018-02-12: qty 2

## 2018-02-12 MED ORDER — ROCURONIUM BROMIDE 50 MG/5ML IV SOLN
INTRAVENOUS | Status: AC
  Filled 2018-02-12: qty 1

## 2018-02-12 MED ORDER — DEXAMETHASONE SODIUM PHOSPHATE 10 MG/ML IJ SOLN
INTRAMUSCULAR | Status: DC | PRN
  Administered 2018-02-12: 8 mg via INTRAVENOUS

## 2018-02-12 MED ORDER — FENTANYL CITRATE (PF) 100 MCG/2ML IJ SOLN
INTRAMUSCULAR | Status: AC
  Filled 2018-02-12: qty 2

## 2018-02-12 MED ORDER — LACTATED RINGERS IV SOLN
INTRAVENOUS | Status: DC
  Administered 2018-02-12: 07:00:00 via INTRAVENOUS

## 2018-02-12 MED ORDER — IBUPROFEN 400 MG PO TABS
600.0000 mg | ORAL_TABLET | Freq: Four times a day (QID) | ORAL | Status: DC | PRN
  Administered 2018-02-12: 600 mg via ORAL
  Filled 2018-02-12: qty 2

## 2018-02-12 MED ORDER — ACETAMINOPHEN 500 MG PO TABS
1000.0000 mg | ORAL_TABLET | Freq: Four times a day (QID) | ORAL | Status: DC
  Administered 2018-02-12: 1000 mg via ORAL
  Filled 2018-02-12: qty 2

## 2018-02-12 MED ORDER — ONDANSETRON HCL 4 MG/2ML IJ SOLN
INTRAMUSCULAR | Status: AC
  Filled 2018-02-12: qty 2

## 2018-02-12 MED ORDER — MIDAZOLAM HCL 2 MG/2ML IJ SOLN
INTRAMUSCULAR | Status: AC
  Filled 2018-02-12: qty 2

## 2018-02-12 SURGICAL SUPPLY — 33 items
BLADE SURG 15 STRL LF DISP TIS (BLADE) ×1 IMPLANT
BLADE SURG 15 STRL SS (BLADE) ×1
CANISTER SUCT 1200ML W/VALVE (MISCELLANEOUS) ×2 IMPLANT
CLIP VESOCCLUDE MED 6/CT (CLIP) ×2 IMPLANT
COVER WAND RF STERILE (DRAPES) IMPLANT
DERMABOND ADVANCED (GAUZE/BANDAGES/DRESSINGS) ×1
DERMABOND ADVANCED .7 DNX12 (GAUZE/BANDAGES/DRESSINGS) ×1 IMPLANT
DRAPE MAG INST 16X20 L/F (DRAPES) ×2 IMPLANT
ELECT CAUTERY BLADE TIP 2.5 (TIP) ×2
ELECT REM PT RETURN 9FT ADLT (ELECTROSURGICAL) ×2
ELECTRODE CAUTERY BLDE TIP 2.5 (TIP) ×1 IMPLANT
ELECTRODE REM PT RTRN 9FT ADLT (ELECTROSURGICAL) ×1 IMPLANT
EVICEL AIRLESS SPRAY ACCES (MISCELLANEOUS) ×2 IMPLANT
GAUZE 4X4 16PLY RFD (DISPOSABLE) IMPLANT
GLOVE BIO SURGEON STRL SZ 6.5 (GLOVE) ×4 IMPLANT
GLOVE INDICATOR 7.0 STRL GRN (GLOVE) ×4 IMPLANT
GOWN STRL REUS W/ TWL LRG LVL3 (GOWN DISPOSABLE) ×2 IMPLANT
GOWN STRL REUS W/TWL LRG LVL3 (GOWN DISPOSABLE) ×4
KIT TURNOVER KIT A (KITS) ×2 IMPLANT
LABEL OR SOLS (LABEL) ×2 IMPLANT
MARKER SKIN DUAL TIP RULER LAB (MISCELLANEOUS) ×2 IMPLANT
NS IRRIG 500ML POUR BTL (IV SOLUTION) ×2 IMPLANT
PACK HEAD/NECK (MISCELLANEOUS) ×2 IMPLANT
RETRACTOR STAY HOOK 5MM (MISCELLANEOUS) IMPLANT
SHEARS HARMONIC 9CM CVD (BLADE) IMPLANT
SPONGE KITTNER 5P (MISCELLANEOUS) ×2 IMPLANT
STRIP CLOSURE SKIN 1/2X4 (GAUZE/BANDAGES/DRESSINGS) ×2 IMPLANT
SURGICEL SNOW 2X4 (HEMOSTASIS) ×2 IMPLANT
SUT PROLENE 4 0 PS 2 18 (SUTURE) ×2 IMPLANT
SUT SILK 3 0 (SUTURE)
SUT SILK 3-0 18XBRD TIE 12 (SUTURE) IMPLANT
SUT VIC AB 4-0 SH 27 (SUTURE) ×1
SUT VIC AB 4-0 SH 27XANBCTRL (SUTURE) ×1 IMPLANT

## 2018-02-12 NOTE — Anesthesia Postprocedure Evaluation (Signed)
Anesthesia Post Note  Patient: Katie Benson  Procedure(s) Performed: PARATHYROIDECTOMY (N/A )  Patient location during evaluation: PACU Anesthesia Type: General Level of consciousness: awake and alert Pain management: pain level controlled Vital Signs Assessment: post-procedure vital signs reviewed and stable Respiratory status: spontaneous breathing, nonlabored ventilation, respiratory function stable and patient connected to nasal cannula oxygen Cardiovascular status: blood pressure returned to baseline and stable Postop Assessment: no apparent nausea or vomiting Anesthetic complications: no     Last Vitals:  Vitals:   02/12/18 1232 02/12/18 1351  BP: 135/83 (!) 126/53  Pulse: (!) 58 (!) 56  Resp: 13 12  Temp: 36.8 C 37 C  SpO2: 100% 100%    Last Pain:  Vitals:   02/12/18 1351  TempSrc: Oral  PainSc:                  Durenda Hurt

## 2018-02-12 NOTE — Progress Notes (Signed)
Pt requesting pain meds. Explained not due until 9pm. Her and fiance arguing in room and pt crying. Pt asked to walk to get some time to herself and gowns put on and rn and cna walked with pt and walker. Pt stated she wanted to walk by herself and this rn stated pt unsteady on her feet and I am unable to let her walk the hospital alone. I asked pt if she wanted me to ask her fiance to leave so she can have some time to herself. She stated she did not want him to leave and just to take her back to her room. Upon entering room fiance still arguing and then he left room. Pt states to this rn "you got what you wanted, he left". I stated to her that I asked her if she wanted me to ask him to leave since she stated she wanted a minute to herself. Pt stated she would sit in br alone and I stated I could not leave her their by herself. She got on bed and refused bed alarm and further assistance to the br. States she can do on her own. Called dr.cannon and got permission to give pain meds 45 min early. Pain meds given. Asked pt to call when going to br so we can assist her and she states ok. Fiance in room at this time. Cont to monitor.

## 2018-02-12 NOTE — Anesthesia Procedure Notes (Signed)
Procedure Name: Intubation Date/Time: 02/12/2018 7:35 AM Performed by: Johnna Acosta, CRNA Pre-anesthesia Checklist: Patient identified, Emergency Drugs available, Suction available, Patient being monitored and Timeout performed Patient Re-evaluated:Patient Re-evaluated prior to induction Oxygen Delivery Method: Circle system utilized Preoxygenation: Pre-oxygenation with 100% oxygen Induction Type: IV induction Ventilation: Mask ventilation without difficulty Laryngoscope Size: Miller and 2 Grade View: Grade I Tube type: Oral Tube size: 7.0 mm Number of attempts: 1 Airway Equipment and Method: Stylet and Bite block Placement Confirmation: ETT inserted through vocal cords under direct vision,  positive ETCO2 and breath sounds checked- equal and bilateral Secured at: 21 cm Tube secured with: Tape Dental Injury: Teeth and Oropharynx as per pre-operative assessment  Difficulty Due To: Difficulty was anticipated and Difficult Airway- due to limited oral opening

## 2018-02-12 NOTE — Transfer of Care (Signed)
Immediate Anesthesia Transfer of Care Note  Patient: Katie Benson  Procedure(s) Performed: PARATHYROIDECTOMY (N/A )  Patient Location: PACU  Anesthesia Type:General  Level of Consciousness: awake, alert  and oriented  Airway & Oxygen Therapy: Patient Spontanous Breathing and Patient connected to face mask oxygen  Post-op Assessment: Report given to RN and Post -op Vital signs reviewed and stable  Post vital signs: Reviewed and stable  Last Vitals:  Vitals Value Taken Time  BP 115/78 02/12/2018  8:46 AM  Temp 36.4 C 02/12/2018  8:46 AM  Pulse 73 02/12/2018  8:47 AM  Resp 28 02/12/2018  8:47 AM  SpO2 100 % 02/12/2018  8:47 AM  Vitals shown include unvalidated device data.  Last Pain:  Vitals:   02/12/18 0846  TempSrc: Temporal  PainSc:       Patients Stated Pain Goal: 5 (87/[id number]/43 [id number])  Complications: No apparent anesthesia complications

## 2018-02-12 NOTE — Anesthesia Post-op Follow-up Note (Signed)
Anesthesia QCDR form completed.        

## 2018-02-12 NOTE — Op Note (Signed)
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  Indications: Katie Benson is a 37 year old woman who was preparing to undergo uterine artery ablation.  Regular labs were performed that demonstrated a markedly elevated serum calcium.  She was admitted to the hospital and treated with fluids and calcitonin.  She was then referred to an endocrinologist and subsequently to endocrine surgery for surgical evaluation of her hyperparathyroidism.  She was found to have low vitamin D and was placed on ergocalciferol for replacement.  It was felt that she had a combination of primary with a sub-component of secondary hyperparathyroidism.  Preoperative ultrasound demonstrated a markedly enlarged left inferior parathyroid adenoma.  Parathyroidectomy was recommended.  The risks the procedure were discussed with the patient and she agreed to proceed.  Procedure In Detail: The patient was identified in the preoperative holding area and once again the risks of the operation were discussed with her.  She agreed to proceed and all of her questions were answered.  She was then brought to the operating room and placed supine on the OR table.  Bony prominences were padded and bilateral sequential compression devices were placed on the lower extremities.  General endotracheal anesthesia was induced without incident.  A shoulder roll was placed.  She was then positioned appropriately for the operation and sterilely prepped and draped in standard fashion.  A timeout is performed  confirming the patient's identity the procedure being performed her allergies all necessary equipment was available and that a maintenance anesthetic agent was not used.  A 4 cm transverse incision was made in a natural skin fold that was appropriate position for the operation.  This was carried down through the subcutaneous tissues and platysma using electrocautery.  Subplatysmal flaps were elevated and the strap muscles were divided in the median raphae.  We elevated the strap muscles off of the left lobe of the thyroid and retracted them laterally.  Immediately, the enlarged parathyroid gland was appreciated.  It was carefully dissected away from the surrounding tissues and elevated on its vascular pedicle which was then divided.  It was handed off as a specimen.  We irrigated the wound bed and obtain good hemostasis.  A Valsalva maneuver from the anesthesia team confirmed no ongoing surgical bleeding.  Snow and AdvaSeal were applied for additional hemostatic effect.  The strap muscles were closed in the midline with running 4-0 Vicryl and the platysma with interrupted Vicryl.  The skin was closed with running subcuticular Prolene.  The skin was cleaned and Dermabond and Steri-Strips were applied.  The Prolene suture was removed.  The patient was awakened extubated and taken to the postanesthesia care unit in good condition.  EBL: <2cc   IVF: 800cc  Specimen(s): left inferior parathyroid adenoma  Complications: none immediately apparent.   Counts: all needles, instruments, and sponges were counted and reported to be correct in number at the end of the case.   I was present for and participated in the entire operation.  Fredirick Maudlin 02/12/18  8:51 AM

## 2018-02-12 NOTE — Brief Op Note (Signed)
02/12/2018  8:42 AM  PATIENT:  Katie Benson  37 y.o. female  PRE-OPERATIVE DIAGNOSIS:  PRIMARY AND SECONDARY  HYPERPARATHYROIDISM  POST-OPERATIVE DIAGNOSIS:  PRIMARY AND SECONDARY  HYPERPARATHYROIDISM  PROCEDURE:  Procedure(s): PARATHYROIDECTOMY (N/A)  SURGEON:  Surgeon(s) and Role:    * Fredirick Maudlin, MD - Primary  PHYSICIAN ASSISTANT:   ASSISTANTS: none   ANESTHESIA:   general  EBL:  2 mL   BLOOD ADMINISTERED:none  DRAINS: none   LOCAL MEDICATIONS USED:  NONE  SPECIMEN:  Left inferior parathyroid adenoma  DISPOSITION OF SPECIMEN:  PATHOLOGY  COUNTS:  YES  TOURNIQUET:  * No tourniquets in log *  DICTATION: .Dragon Dictation  PLAN OF CARE: Admit for overnight observation  PATIENT DISPOSITION:  PACU - hemodynamically stable.   Delay start of Pharmacological VTE agent (>24hrs) due to surgical blood loss or risk of bleeding: not applicable

## 2018-02-12 NOTE — H&P (Signed)
Patient ID: Katie Benson, female   DOB: 1980/06/04, 37 y.o.   MRN: [id number]      Chief Complaint  Patient presents with  . New Patient (Initial Visit)    Hypercalceimeia    HPI Katie Benson is a 37 y.o. female.   She was referred by Dr. Loanne Drilling (Endocrinology) for surgical evaluation of hyperparathyroidism.  Katie Benson has a history of abnormal uterine bleeding and was preparing to undergo uterine artery embolization. Pre-procedure lab work revealed a markedly elevated calcium level (13 mg/dL). She was admitted to the hospital and treated with IV hydration and calcitonin. She was discharged with a calcium level of 10.2, however on recheck labs a week later, she was again hypercalcemic.  She was referred to endocrinology for further evaluation.  She was felt to have primary hyperparathyroidism, with a secondary component due to vitamin D deficiency. She was instructed to start taking OTC vitamin D, however she has not yet started.     She complains of back pain, fatigue, abdominal discomfort, nausea, fatigue, depression, and weight gain. She does have GERD, currently being treated with a PPI. She had an episode of nephrolithiasis about 7 years ago. She denies constipation, long bone pain, pancreatitis, or pathologic fracture.  She endorses foggy thinking, polydipsia, nocturia 6-7 times/night, sleep disturbances. Her mother had calcium issues, but the patient isn't sure of the etiology.       Past Medical History:  Diagnosis Date  . Fibroids   . Medical history non-contributory   . Neck injury          Past Surgical History:  Procedure Laterality Date  . CESAREAN SECTION    . IR RADIOLOGIST EVAL & MGMT  10/17/2017  . IR RADIOLOGIST EVAL & MGMT  12/20/2017         Family History  Problem Relation Age of Onset  . Diabetes Sister   . Cancer Maternal Grandmother        ovarian  . Hypercalcemia Mother     Social History Social History     Tobacco Use  . Smoking status: Current Every Day Smoker    Packs/day: 0.25    Years: 14.00    Pack years: 3.50    Types: Cigarettes  . Smokeless tobacco: Never Used  Substance Use Topics  . Alcohol use: Yes    Comment: occassionally on weekends  . Drug use: Yes    Types: Marijuana        Allergies  Allergen Reactions  . Pork-Derived Products Hives and Swelling    Current Outpatient Medications  Medication Sig Dispense Refill  . Acetaminophen-Codeine (TYLENOL/CODEINE #3) 300-30 MG tablet Take 1 tablet by mouth every 4 (four) hours as needed for pain. 16 tablet 0  . ferrous sulfate 325 (65 FE) MG tablet Take 1 tablet (325 mg total) by mouth daily. 30 tablet 5  . megestrol (MEGACE) 40 MG tablet Take 1 tablet (40 mg total) by mouth 2 (two) times daily. 60 tablet 2  . ondansetron (ZOFRAN ODT) 4 MG disintegrating tablet Take 1 tablet (4 mg total) by mouth every 8 (eight) hours as needed for nausea or vomiting. 20 tablet 0  . pantoprazole (PROTONIX) 40 MG tablet Take 1 tablet (40 mg total) by mouth 2 (two) times daily. 60 tablet 1  . Vitamin D, Ergocalciferol, (DRISDOL) 1.25 MG (50000 UT) CAPS capsule Take 1 capsule (50,000 Units total) by mouth 2 (two) times a week. Take on Mondays and Thursdays. 8 capsule 1   No current  facility-administered medications for this visit.     Review of Systems Review of Systems  Constitutional: Positive for appetite change and fatigue.       Weight gain since being on Megace; "always hungry"  HENT: Negative.   Eyes: Positive for visual disturbance.       Blurred vision, especially when fatigued.  Respiratory: Positive for shortness of breath.   Cardiovascular: Positive for palpitations.  Gastrointestinal: Positive for abdominal pain and nausea.  Musculoskeletal: Positive for back pain.  Skin: Negative.   Allergic/Immunologic: Negative.   Neurological: Positive for headaches.  Hematological: Negative.     Psychiatric/Behavioral: Positive for dysphoric mood.  All other systems reviewed and are negative.   Vitals:   02/12/18 0649  BP: 128/81  Pulse: 62  Resp: 16  Temp: 97.8 F (36.6 C)  SpO2: 100%      Physical Exam Physical Exam  Constitutional: She is oriented to person, place, and time. She appears well-developed and well-nourished. No distress.  HENT:  Head: Normocephalic and atraumatic.  Mouth/Throat: Oropharynx is clear and moist. No oropharyngeal exudate.  Eyes: Pupils are equal, round, and reactive to light. Conjunctivae are normal. No scleral icterus.  Neck: Normal range of motion. No tracheal deviation present. No thyromegaly present.  Cardiovascular: Normal rate, regular rhythm, normal heart sounds and intact distal pulses.  Pulmonary/Chest: Effort normal and breath sounds normal.  Abdominal: Soft. Bowel sounds are normal.  Musculoskeletal: She exhibits no edema or deformity.  Lymphadenopathy:    She has no cervical adenopathy.  Neurological: She is alert and oriented to person, place, and time.  Skin: Skin is warm and dry.  Psychiatric: She has a normal mood and affect. Thought content normal.    Data Reviewed  Results for Katie, Benson (MRN [id number]) as of 01/08/2018 14:31  Ref. Range 12/06/2017 09:04 12/06/2017 10:23 12/06/2017 13:43 12/07/2017 05:44 12/07/2017 11:02 12/08/2017 05:38 12/08/2017 05:43 12/18/2017 14:20 12/24/2017 09:41 12/24/2017 09:45 12/27/2017 08:12  Calcium Latest Ref Range: 8.6 - 10.2 mg/dL 13.1 (HH) 13.0 (H)  12.3 (H)  10.4 (H) 10.2 12.4 (H)  12.8 (H) 11.7 (H)  Creatinine Latest Ref Range: 0.57 - 1.00 mg/dL 0.83 0.83  0.75  0.71 0.65 0.91  0.90   Phosphorus Latest Ref Range: 2.5 - 4.6 mg/dL       1.6 (L)      Alkaline Phosphatase Latest Ref Range: 39 - 117 IU/L          148 (H)   GFR, Est African American Latest Ref Range: >59 mL/min/1.73 >60 >60  >60  >60 >60 93  94   Calcium, Serum Latest  Units: mg/dL         12.6 (H)    Vitamin D, 25-Hydroxy Latest Ref Range: 30.0 - 100.0 ng/mL     7.6 (L)        Vitamin D3 1, 25 (OH) Latest Units: pg/mL           69  Vitamin D2 1, 25 (OH) Latest Units: pg/mL           <8  Vitamin D 1, 25 (OH) Total Latest Ref Range: 18 - 72 pg/mL           69  PTH, Intact Latest Ref Range: 14 - 64 pg/mL   46        215 (H)  Calcium, Total (PTH) Latest Ref Range: 8.7 - 10.2 mg/dL   11.7 (H)          PTH-related peptide Latest  Units: pmol/L     <2.0        PTH (Intact Assay) Latest Units: pg/mL         286 (H)     Ultrasound performed in clinic today. See separate documentation. 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     Assessment    37 y/o F with hypercalcemia due to what appears to be mainly primary hyperparathyroidism. There is also likely a secondary component due to extremely low 25-OH vitamin D level.  She meets criteria for surgical intervention based on age and degree of calcium elevation, in addition to her symptoms.  She is eager to proceed with surgery, however I am concerned that she will have significant difficulty with post-op hypocalcemia if she does not replete her vitamin D level.    Plan    The risks of parathyroid surgery were discussed with the patient, including (but not limited to): bleeding, infection, damage to surrounding structures/tissues, injury (temporary or permanent) to the recurrent laryngeal nerve, hypocalcemia (temporary or permanent), need to take calcium and/or vitamin D supplementation, recurrent hyperparathyroidism, failure to correct hyperparathyroidism, need for additional surgery.  The patient had the opportunity to ask any questions and these were answered to their satisfaction.  I have reviewed the patient's history and there have been no interval changes. She has been taking the prescribed ergocalciferol.   Will proceed with parathyroid surgery today.  Fredirick Maudlin 02/12/18 7:19 AM

## 2018-02-12 NOTE — Anesthesia Preprocedure Evaluation (Addendum)
Anesthesia Evaluation  Patient identified by MRN, date of birth, ID band Patient awake    Reviewed: Allergy & Precautions, H&P , NPO status , Patient's Chart, lab work & pertinent test results  Airway Mallampati: II       Dental  (+) Chipped   Pulmonary neg COPD, Current Smoker,           Cardiovascular (-) angina(-) Past MI and (-) Cardiac Stents negative cardio ROS  (-) dysrhythmias      Neuro/Psych negative neurological ROS  negative psych ROS   GI/Hepatic GERD  ,(+)     substance abuse  marijuana use,   Endo/Other  negative endocrine ROS  Renal/GU      Musculoskeletal   Abdominal   Peds  Hematology  (+) Blood dyscrasia, anemia ,   Anesthesia Other Findings Past Medical History: No date: Anemia No date: Fibroids No date: GERD (gastroesophageal reflux disease) No date: History of kidney stones No date: Neck injury  Past Surgical History: No date: CESAREAN SECTION 10/17/2017: IR RADIOLOGIST EVAL & MGMT 12/20/2017: IR RADIOLOGIST EVAL & MGMT     Reproductive/Obstetrics negative OB ROS                            Anesthesia Physical Anesthesia Plan  ASA: II  Anesthesia Plan: General ETT   Post-op Pain Management:    Induction: Intravenous  PONV Risk Score and Plan: 2 and Ondansetron, Dexamethasone, Midazolam and Treatment may vary due to age or medical condition  Airway Management Planned:   Additional Equipment:   Intra-op Plan:   Post-operative Plan:   Informed Consent: I have reviewed the patients History and Physical, chart, labs and discussed the procedure including the risks, benefits and alternatives for the proposed anesthesia with the patient or authorized representative who has indicated his/her understanding and acceptance.   Dental Advisory Given  Plan Discussed with: Anesthesiologist, CRNA and Surgeon  Anesthesia Plan Comments:          Anesthesia Quick Evaluation

## 2018-02-13 ENCOUNTER — Telehealth: Payer: Self-pay | Admitting: Family Medicine

## 2018-02-13 ENCOUNTER — Encounter: Payer: Self-pay | Admitting: Family Medicine

## 2018-02-13 LAB — CALCIUM
Calcium: 10.4 mg/dL — ABNORMAL HIGH (ref 8.9–10.3)
Calcium: 10.3 mg/dL (ref 8.9–10.3)

## 2018-02-13 LAB — CALCIUM, IONIZED: Calcium, Ionized, Serum: 6.6 mg/dL — ABNORMAL HIGH (ref 4.5–5.6)

## 2018-02-13 LAB — ALBUMIN
ALBUMIN: 4 g/dL (ref 3.5–5.0)
Albumin: 4.1 g/dL (ref 3.5–5.0)

## 2018-02-13 LAB — SURGICAL PATHOLOGY

## 2018-02-13 MED ORDER — CALCIUM CARBONATE-VITAMIN D 500-200 MG-UNIT PO TABS: 1.0000 | ORAL_TABLET | Freq: Two times a day (BID) | ORAL | 1 refills | Status: DC

## 2018-02-13 MED ORDER — HYDROCODONE-ACETAMINOPHEN 5-325 MG PO TABS: 1.0000 | ORAL_TABLET | ORAL | 0 refills | Status: DC | PRN

## 2018-02-13 MED ORDER — CALCITRIOL 0.25 MCG PO CAPS: 0.2500 ug | ORAL_CAPSULE | Freq: Two times a day (BID) | ORAL | 1 refills | Status: DC

## 2018-02-13 NOTE — Telephone Encounter (Signed)
Maite,  Contact patient and scheduled her for a surgical clearance visit for 12/30 or 12/31. She needs clearance from me as her PCP to have Fibroid  Surgery.   Thanks,  Joelene Millin

## 2018-02-13 NOTE — Discharge Summary (Addendum)
[facility] SURGICAL DISCHARGE SUMMARY  Patient ID: Katie Benson MRN: [id number] DOB/AGE: 08-24-1980 37 y.o.  Admit date: 02/12/2018 Discharge date: 02/13/2018  Discharge Diagnoses Patient Active Problem List   Diagnosis Date Noted  . S/P parathyroidectomy 02/12/2018  . Primary hyperparathyroidism ([place]) 01/08/2018  . Hyperparathyroidism , secondary, non-renal ([place]) 01/08/2018  . Fibroid uterus 12/06/2017  . Hypercalcemia 12/06/2017    Consultants None  Procedures Parathyroidectomy on 12/17  HPI: Katie Benson is a 37 y.o. female who was referred to Dr Celine Ahr for surgical intervention of hyperparathyroidism. She presented to [facility] on 02/12/18 for scheduled parathyroidectomy with Dr [facility] Course: Informed consent was obtained and documented, and patient underwent uneventful parathyroidectomy (Dr Celine Ahr, 02/12/2018).  Post-operatively, patient's  improved/resolved and advancement of patient's diet and ambulation were well-tolerated. The remainder of patient's hospital course was essentially unremarkable, and discharge planning was initiated accordingly with patient safely able to be discharged home with appropriate discharge instructions, pain control, and outpatient follow-up after all of her questions were answered to her expressed satisfaction.  Discharge Condition: Good   Physical Examination:  Constitutional: NAD, well appearing female Neck: Steri-Strips to anterior neck, mild tenderness Pulmonary: Normal effort, no respiratory distress.  Skin: Warm and dry    Allergies as of 02/13/2018      Reactions   Pork-derived Products Hives, Swelling      Medication List    STOP taking these medications   Acetaminophen-Codeine 300-30 MG tablet Commonly known as:  TYLENOL/CODEINE #3     TAKE these medications   calcitRIOL 0.25 MCG capsule Commonly known as:  ROCALTROL Take 1 capsule (0.25 mcg total) by mouth 2 (two) times  daily.   calcium-vitamin D 500-200 MG-UNIT tablet Commonly known as:  OSCAL 500/200 D-3 Take 1 tablet by mouth 2 (two) times daily.   ferrous sulfate 325 (65 FE) MG tablet Take 1 tablet (325 mg total) by mouth daily.   HYDROcodone-acetaminophen 5-325 MG tablet Commonly known as:  NORCO/VICODIN Take 1-2 tablets by mouth every 4 (four) hours as needed for moderate pain.   megestrol 40 MG tablet Commonly known as:  MEGACE Take 1 tablet (40 mg total) by mouth 2 (two) times daily.   ondansetron 4 MG disintegrating tablet Commonly known as:  ZOFRAN ODT Take 1 tablet (4 mg total) by mouth every 8 (eight) hours as needed for nausea or vomiting.   pantoprazole 40 MG tablet Commonly known as:  PROTONIX Take 1 tablet (40 mg total) by mouth 2 (two) times daily.   Vitamin D (Ergocalciferol) 1.25 MG (50000 UT) Caps capsule Commonly known as:  DRISDOL Take 1 capsule (50,000 Units total) by mouth 2 (two) times a week. Take on Mondays and Thursdays.        Follow-up Information    Fredirick Maudlin, MD. Schedule an appointment as soon as possible for a visit in 2 week(s).   Specialty:  General Surgery Why:  s/p parathyroidectomy Contact information: [address] [place] [address] [phone]            -- Edison Simon , PA-C  [facility]  02/13/2018, 11:40 AM [phone] M-F: 7am - 4pm  I saw and evaluated the patient.  I agree with the above documentation, exam, and plan, which I have edited where appropriate. Fredirick Maudlin  12:37 PM  02/13/18

## 2018-02-13 NOTE — Discharge Instructions (Signed)
In addition to included general post-operative instructions for parathyroidectomy,  Diet: Resume home diet.   Activity: Do not drive or drink alcohol if taking narcotic pain medications.  Wound care: Do NOT remove steri-strips. You may shower, try to protect your incision and pat dry, do NOT saturate or submerge incision.   Medications: For mild to moderate pain: Aleve. You can also apply ice to your incision to aid in relieving pain and swelling.  Do not take Tylenol, the narcotic pain medication you have been given is combined with Tylenol. Taking additional Tylenol in combination with your narcotic pain medication could lead to Tylenol overdose.   Call office [phone] / [phone]) at any time if any questions, worsening pain, fevers/chills, bleeding, drainage from incision site, or other concerns.

## 2018-02-13 NOTE — Progress Notes (Signed)
Patient cleared for discharge. Instructions given. Prescriptions given. IV removed. D/c to home via POV

## 2018-02-14 ENCOUNTER — Telehealth: Payer: Self-pay | Admitting: *Deleted

## 2018-02-14 LAB — PTH, INTACT AND CALCIUM
Calcium, Total (PTH): 10.5 mg/dL — ABNORMAL HIGH (ref 8.7–10.2)
PTH: 5 pg/mL — ABNORMAL LOW (ref 15–65)

## 2018-02-14 NOTE — Telephone Encounter (Signed)
Patient called and stated that she had surgery with Dr.Cannon yesterday and she is requesting something to help her swallow. Please call and advise

## 2018-02-14 NOTE — Telephone Encounter (Signed)
She can use over the counter sore throat lozenges.

## 2018-02-14 NOTE — Telephone Encounter (Signed)
Patient instructed about OTC throat lozenges, she is aware she can get these at any drug store as well as the [place]. She was very appreciative of this.

## 2018-02-15 LAB — CALCIUM, IONIZED: Calcium, Ionized, Serum: 6.8 mg/dL — ABNORMAL HIGH (ref 4.5–5.6)

## 2018-02-25 ENCOUNTER — Ambulatory Visit (INDEPENDENT_AMBULATORY_CARE_PROVIDER_SITE_OTHER): Payer: Self-pay

## 2018-02-25 ENCOUNTER — Ambulatory Visit: Payer: Self-pay

## 2018-02-25 ENCOUNTER — Encounter: Payer: Self-pay | Admitting: Family Medicine

## 2018-02-25 ENCOUNTER — Ambulatory Visit (INDEPENDENT_AMBULATORY_CARE_PROVIDER_SITE_OTHER): Payer: Self-pay | Admitting: Family Medicine

## 2018-02-25 VITALS — BP 113/74 | HR 95 | Resp 17 | Ht 63.0 in | Wt 151.0 lb

## 2018-02-25 DIAGNOSIS — G8918 Other acute postprocedural pain: Secondary | ICD-10-CM

## 2018-02-25 DIAGNOSIS — E210 Primary hyperparathyroidism: Secondary | ICD-10-CM

## 2018-02-25 DIAGNOSIS — G8929 Other chronic pain: Secondary | ICD-10-CM

## 2018-02-25 DIAGNOSIS — G4700 Insomnia, unspecified: Secondary | ICD-10-CM

## 2018-02-25 DIAGNOSIS — M25562 Pain in left knee: Secondary | ICD-10-CM

## 2018-02-25 DIAGNOSIS — Z9889 Other specified postprocedural states: Secondary | ICD-10-CM

## 2018-02-25 DIAGNOSIS — Z01818 Encounter for other preprocedural examination: Secondary | ICD-10-CM

## 2018-02-25 DIAGNOSIS — E892 Postprocedural hypoparathyroidism: Secondary | ICD-10-CM

## 2018-02-25 DIAGNOSIS — M1712 Unilateral primary osteoarthritis, left knee: Secondary | ICD-10-CM

## 2018-02-25 MED ORDER — PANTOPRAZOLE SODIUM 40 MG PO TBEC: 40.0000 mg | DELAYED_RELEASE_TABLET | Freq: Two times a day (BID) | ORAL | 11 refills | Status: DC

## 2018-02-25 MED ORDER — HYDROXYZINE HCL 50 MG PO TABS: 50.0000 mg | ORAL_TABLET | Freq: Three times a day (TID) | ORAL | 2 refills | Status: DC | PRN

## 2018-02-25 MED FILL — hydrOXYzine HCL 50 MG TABS: 50 | 10 days supply | Qty: 60 | Fill #0

## 2018-02-25 MED FILL — PANTOPRAZOLE SOD DR 40 MG T: 40 | 30 days supply | Qty: 60 | Fill #0

## 2018-02-25 NOTE — Progress Notes (Signed)
refd  Established Patient Office Visit Subjective:  Patient ID: Katie Benson, female    DOB: [birth year]  Age: 37 y.o. MRN: [id number]  CC:  Chief Complaint  Patient presents with  . Medical Clearance   HPI Katie Benson presents for surgical clearance to undergo fibroid removal . She is s/p parathyroidectomy secondary to an incidental finding of a parathyroid adenoma which was subsequently resulting in patient developing hypercalcemia.  Patient had been followed by OB/GYN and was in the preop phase of surgery to have a fibroid removed when she was subsequently found to have severely elevated levels of calcium. At that point she was referred to me for primary care for surgical clearance back in December. Her calcium level was repeated here in office and was also found to be had a severely elevated level. She was then referred to endocrinology for further work-up and evaluation was found to have a parathyroid adenoma.  She underwent a parathyroidectomy on 02/12/2018 at [facility].  The surgery was successful.  Postoperatively her calcium levels have remained in the normal range.  She is scheduled to follow-up with her surgeon on 02/28/2018.  She presents today for surgical clearance to undergo fibroid removal surgery.  She continues to have some residual pain of the neck postoperatively however continues to have good range of motion of her neck main complaint is insomnia inability to get comfortable for rest.  Past Medical History:  Diagnosis Date  . Anemia   . Fibroids   . GERD (gastroesophageal reflux disease)   . History of kidney stones   . Neck injury     Past Surgical History:  Procedure Laterality Date  . CESAREAN SECTION    . IR RADIOLOGIST EVAL & MGMT  10/17/2017  . IR RADIOLOGIST EVAL & MGMT  12/20/2017  . PARATHYROIDECTOMY N/A 02/12/2018   Procedure: PARATHYROIDECTOMY;  Surgeon: Fredirick Maudlin, MD;  Location: [facility] ORS;  Service: General;  Laterality:  N/A;    Family History  Problem Relation Age of Onset  . Diabetes Sister   . Cancer Maternal Grandmother        ovarian  . Hypercalcemia Mother     Social History   Socioeconomic History  . Marital status: Single    Spouse name: Not on file  . Number of children: Not on file  . Years of education: Not on file  . Highest education level: Not on file  Occupational History  . Not on file  Social Needs  . Financial resource strain: Not on file  . Food insecurity:    Worry: Not on file    Inability: Not on file  . Transportation needs:    Medical: Not on file    Non-medical: Not on file  Tobacco Use  . Smoking status: Current Every Day Smoker    Packs/day: 0.25    Years: 14.00    Pack years: 3.50    Types: Cigarettes  . Smokeless tobacco: Never Used  Substance and Sexual Activity  . Alcohol use: Yes    Comment: occassionally on weekends  . Drug use: Yes    Types: Marijuana    Comment: occ  . Sexual activity: Yes    Birth control/protection: Condom, None  Lifestyle  . Physical activity:    Days per week: Not on file    Minutes per session: Not on file  . Stress: Not on file  Relationships  . Social connections:    Talks on phone: Not on file  Gets together: Not on file    Attends religious service: Not on file    Active member of club or organization: Not on file    Attends meetings of clubs or organizations: Not on file    Relationship status: Not on file  . Intimate partner violence:    Fear of current or ex partner: Not on file    Emotionally abused: Not on file    Physically abused: Not on file    Forced sexual activity: Not on file  Other Topics Concern  . Not on file  Social History Narrative  . Not on file    Outpatient Medications Prior to Visit  Medication Sig Dispense Refill  . calcitRIOL (ROCALTROL) 0.25 MCG capsule Take 1 capsule (0.25 mcg total) by mouth 2 (two) times daily. 30 capsule 1  . calcium-vitamin D (OSCAL 500/200 D-3) 500-200  MG-UNIT tablet Take 1 tablet by mouth 2 (two) times daily. 30 tablet 1  . ferrous sulfate 325 (65 FE) MG tablet Take 1 tablet (325 mg total) by mouth daily. 30 tablet 5  . HYDROcodone-acetaminophen (NORCO/VICODIN) 5-325 MG tablet Take 1-2 tablets by mouth every 4 (four) hours as needed for moderate pain. 30 tablet 0  . megestrol (MEGACE) 40 MG tablet Take 1 tablet (40 mg total) by mouth 2 (two) times daily. 60 tablet 2  . ondansetron (ZOFRAN ODT) 4 MG disintegrating tablet Take 1 tablet (4 mg total) by mouth every 8 (eight) hours as needed for nausea or vomiting. 20 tablet 0  . pantoprazole (PROTONIX) 40 MG tablet Take 1 tablet (40 mg total) by mouth 2 (two) times daily. 60 tablet 1  . Vitamin D, Ergocalciferol, (DRISDOL) 1.25 MG (50000 UT) CAPS capsule Take 1 capsule (50,000 Units total) by mouth 2 (two) times a week. Take on Mondays and Thursdays. 8 capsule 1   No facility-administered medications prior to visit.     Allergies  Allergen Reactions  . Pork-Derived Products Hives and Swelling    ROS Review of Systems Pertinent negatives listed in HPI    Objective:    Physical Exam BP 113/74   Pulse 95   Resp 17   Ht 5\' 3"  (1.6 m)   Wt 151 lb (68.5 kg)   SpO2 97%   BMI 26.75 kg/m  Wt Readings from Last 3 Encounters:  02/25/18 151 lb (68.5 kg)  02/12/18 150 lb (68 kg)  01/07/18 141 lb 9.6 oz (64.2 kg)    Lab Results  Component Value Date   TSH 0.609 08/20/2017   Lab Results  Component Value Date   WBC 8.2 12/24/2017   HGB 12.1 12/24/2017   HCT 40.6 12/24/2017   MCV 78 (L) 12/24/2017   PLT 357 12/24/2017   Lab Results  Component Value Date   NA 142 12/24/2017   K 4.4 12/24/2017   CO2 21 12/24/2017   GLUCOSE 86 12/24/2017   BUN 9 12/24/2017   CREATININE 0.90 12/24/2017   BILITOT 0.3 12/24/2017   ALKPHOS 148 (H) 12/24/2017   AST 22 12/24/2017   ALT 38 (H) 12/24/2017   PROT 7.5 12/24/2017   ALBUMIN 4.0 02/13/2018   CALCIUM 10.5 (H) 02/13/2018   CALCIUM 10.3  02/13/2018   ANIONGAP 6 12/08/2017      Assessment & Plan:  1. Pre-operative clearance - EKG 12-Lead, compared with previous. No ischemic changes noted. NSR ? RBBB  - Comprehensive metabolic panel - CBC with Differential  2. Primary hyperparathyroidism ([place]) -Resolved with parathyroidectomy   3.  S/P parathyroidectomy F/u with surgeon - Calcium - PTH, Intact and Calcium  4. Chronic pain of left knee - DG Knee Complete 4 Views Left; Future - Ambulatory referral to Orthopedic Surgery  5. Insomnia, unspecified type Trial Hydroxyzine at bedtime to induce sleep  6. Post-operative pain -Recommended alternating tylenol and ibuprofen. Patient was recently prescribed a short course of opioids s/p surgery. I declined to extend opioid prescription due to potential of abuse and dependence.   Meds ordered this encounter  Medications  . hydrOXYzine (ATARAX/VISTARIL) 50 MG tablet    Sig: Take 1-2 tablets (50-100 mg total) by mouth every 8 (eight) hours as needed for itching.    Dispense:  60 tablet    Refill:  2  . pantoprazole (PROTONIX) 40 MG tablet    Sig: Take 1 tablet (40 mg total) by mouth 2 (two) times daily.    Dispense:  60 tablet    Refill:  11    Follow-up: Return for As needed .    Molli Barrows, FNP

## 2018-02-26 LAB — CBC WITH DIFFERENTIAL/PLATELET
Basophils Absolute: 0.1 10*3/uL (ref 0.0–0.2)
Basos: 1 %
EOS (ABSOLUTE): 0.2 10*3/uL (ref 0.0–0.4)
EOS: 2 %
HEMATOCRIT: 35 % (ref 34.0–46.6)
Hemoglobin: 11.2 g/dL (ref 11.1–15.9)
Immature Grans (Abs): 0 10*3/uL (ref 0.0–0.1)
Immature Granulocytes: 0 %
LYMPHS ABS: 3.7 10*3/uL — AB (ref 0.7–3.1)
Lymphs: 33 %
MCH: 24.6 pg — ABNORMAL LOW (ref 26.6–33.0)
MCHC: 32 g/dL (ref 31.5–35.7)
MCV: 77 fL — ABNORMAL LOW (ref 79–97)
MONOCYTES: 7 %
Monocytes Absolute: 0.7 10*3/uL (ref 0.1–0.9)
Neutrophils Absolute: 6.5 10*3/uL (ref 1.4–7.0)
Neutrophils: 57 %
Platelets: 339 10*3/uL (ref 150–450)
RBC: 4.56 x10E6/uL (ref 3.77–5.28)
RDW: 15.9 % — ABNORMAL HIGH (ref 12.3–15.4)
WBC: 11.2 10*3/uL — ABNORMAL HIGH (ref 3.4–10.8)

## 2018-02-26 LAB — COMPREHENSIVE METABOLIC PANEL
ALBUMIN: 4.2 g/dL (ref 3.5–5.5)
ALT: 15 IU/L (ref 0–32)
AST: 11 IU/L (ref 0–40)
Albumin/Globulin Ratio: 1.8 (ref 1.2–2.2)
Alkaline Phosphatase: 153 IU/L — ABNORMAL HIGH (ref 39–117)
BUN/Creatinine Ratio: 12 (ref 9–23)
BUN: 9 mg/dL (ref 6–20)
Bilirubin Total: <0.2 mg/dL (ref 0.0–1.2)
CO2: 17 mmol/L — AB (ref 20–29)
Calcium: 9.4 mg/dL (ref 8.7–10.2)
Chloride: 107 mmol/L — ABNORMAL HIGH (ref 96–106)
Creatinine, Ser: 0.75 mg/dL (ref 0.57–1.00)
GFR calc Af Amer: 117 mL/min/{1.73_m2} (ref >59)
GFR calc non Af Amer: 101 mL/min/{1.73_m2} (ref >59)
Globulin, Total: 2.4 g/dL (ref 1.5–4.5)
Glucose: 87 mg/dL (ref 65–99)
Potassium: 3.9 mmol/L (ref 3.5–5.2)
Sodium: 139 mmol/L (ref 134–144)
Total Protein: 6.6 g/dL (ref 6.0–8.5)

## 2018-02-26 LAB — PTH, INTACT AND CALCIUM: PTH: 18 pg/mL (ref 15–65)

## 2018-02-28 ENCOUNTER — Telehealth: Payer: Self-pay | Admitting: Family Medicine

## 2018-02-28 ENCOUNTER — Ambulatory Visit (INDEPENDENT_AMBULATORY_CARE_PROVIDER_SITE_OTHER): Payer: Self-pay | Admitting: General Surgery

## 2018-02-28 ENCOUNTER — Other Ambulatory Visit: Payer: Self-pay

## 2018-02-28 ENCOUNTER — Encounter: Payer: Self-pay | Admitting: Family Medicine

## 2018-02-28 ENCOUNTER — Encounter: Payer: Self-pay | Admitting: General Surgery

## 2018-02-28 VITALS — BP 132/79 | HR 78 | Temp 98.5°F | Resp 16 | Ht 63.0 in | Wt 154.5 lb

## 2018-02-28 DIAGNOSIS — E892 Postprocedural hypoparathyroidism: Secondary | ICD-10-CM

## 2018-02-28 DIAGNOSIS — Z9889 Other specified postprocedural states: Secondary | ICD-10-CM

## 2018-02-28 NOTE — Progress Notes (Signed)
She is here today for a post-op visit after undergoing parathyroid surgery (single gland parathyroidectomy) on 02/12/18.  Final pathology revealed:  DIAGNOSIS:  A. PARATHYROID, LEFT INFERIOR; PARATHYROIDECTOMY:  - PARATHYROID ADENOMA (2.6 GRAMS).   Since surgery, she  report doing fairly well.  She  report(s) rare tingling, mainly in the right hand (?positional); voice and swallowing are normal. She is taking 2 tablets of calcium daily.  Physical Exam: Vitals:   02/28/18 0935  BP: 132/79  Pulse: 78  Resp: 16  Temp: 98.5 F (36.9 C)  SpO2: 100%   General: A&O x 3, NAD Focused Neck Exam: incision well-approximated without concern for infection. Healing ridge is present.  Labs: Results for Katie Benson, Katie Benson (MRN [id number]) as of 02/28/2018 09:53  Ref. Range 02/25/2018 11:59  COMPREHENSIVE METABOLIC PANEL Unknown Rpt (A)  Sodium Latest Ref Range: 134 - 144 mmol/L 139  Potassium Latest Ref Range: 3.5 - 5.2 mmol/L 3.9  Chloride Latest Ref Range: 96 - 106 mmol/L 107 (H)  CO2 Latest Ref Range: 20 - 29 mmol/L 17 (L)  Glucose Latest Ref Range: 65 - 99 mg/dL 87  BUN Latest Ref Range: 6 - 20 mg/dL 9  Creatinine Latest Ref Range: 0.57 - 1.00 mg/dL 0.75  Calcium Latest Ref Range: 8.7 - 10.2 mg/dL 9.4  BUN/Creatinine Ratio Latest Ref Range: 9 - 23  12  Alkaline Phosphatase Latest Ref Range: 39 - 117 IU/L 153 (H)  Albumin Latest Ref Range: 3.5 - 5.5 g/dL 4.2  Albumin/Globulin Ratio Latest Ref Range: 1.2 - 2.2  1.8  AST Latest Ref Range: 0 - 40 IU/L 11  ALT Latest Ref Range: 0 - 32 IU/L 15  Total Protein Latest Ref Range: 6.0 - 8.5 g/dL 6.6  Total Bilirubin Latest Ref Range: 0.0 - 1.2 mg/dL <0.2  GFR, Est Non African American Latest Ref Range: >59 mL/min/1.73 101  GFR, Est African American Latest Ref Range: >59 mL/min/1.73 117  Globulin, Total Latest Ref Range: 1.5 - 4.5 g/dL 2.4  WBC Latest Ref Range: 3.4 - 10.8 x10E3/uL 11.2 (H)  RBC Latest Ref Range: 3.77 - 5.28 x10E6/uL 4.56   Hemoglobin Latest Ref Range: 11.1 - 15.9 g/dL 11.2  HCT Latest Ref Range: 34.0 - 46.6 % 35.0  MCV Latest Ref Range: 79 - 97 fL 77 (L)  MCH Latest Ref Range: 26.6 - 33.0 pg 24.6 (L)  MCHC Latest Ref Range: 31.5 - 35.7 g/dL 32.0  RDW Latest Ref Range: 12.3 - 15.4 % 15.9 (H)  Platelets Latest Ref Range: 150 - 450 x10E3/uL 339  Neutrophils Latest Ref Range: Not Estab. % 57  Immature Granulocytes Latest Ref Range: Not Estab. % 0  NEUT# Latest Ref Range: 1.4 - 7.0 x10E3/uL 6.5  Lymphocyte # Latest Ref Range: 0.7 - 3.1 x10E3/uL 3.7 (H)  Monocytes Absolute Latest Ref Range: 0.1 - 0.9 x10E3/uL 0.7  Basophils Absolute Latest Ref Range: 0.0 - 0.2 x10E3/uL 0.1  Immature Grans (Abs) Latest Ref Range: 0.0 - 0.1 x10E3/uL 0.0  Lymphs Latest Ref Range: Not Estab. % 33  Monocytes Latest Ref Range: Not Estab. % 7  Basos Latest Ref Range: Not Estab. % 1  Eos Latest Ref Range: Not Estab. % 2  EOS (ABSOLUTE) Latest Ref Range: 0.0 - 0.4 x10E3/uL 0.2  PTH, Intact Latest Ref Range: 15 - 65 pg/mL 18    Assessment and Plan: The patient is 3 weeks s/p parathyroidectomy for primary hyperparathyroidism.  Doing well postoperatively.. 1. Scar care discussed. 2. Calcium taper provided, as  indicated. 3. She is clear, from my perspective, to undergo the planned uterine artery embolization. 4. Follow up with PCP  5. RTC PRN.

## 2018-02-28 NOTE — Patient Instructions (Addendum)
Please call our office if you have any questions or concerns  Continue taking Calcium for 2 more weeks then 1 tablet for one week.  You may now massage your incision with Vitamin E to help with scarring.  You may take Tylenol or Aleve for the discomfort. Also do the range of motion movements with your head. You may use heating pad to the back of neck for discomfort.

## 2018-02-28 NOTE — Telephone Encounter (Signed)
Please fax over medical clearance to Dr. Barbie Banner. Follow-up with patient via MyChart to advise clearance has been sent.

## 2018-02-28 NOTE — Telephone Encounter (Signed)
Patient notified via My Chart message.

## 2018-02-28 NOTE — Telephone Encounter (Signed)
Clearance faxed to Dr. Barbie Banner.

## 2018-03-04 ENCOUNTER — Ambulatory Visit (INDEPENDENT_AMBULATORY_CARE_PROVIDER_SITE_OTHER): Payer: Self-pay | Admitting: Orthopaedic Surgery

## 2018-03-05 MED FILL — hydrOXYzine HCL 50 MG TABS: 50 | 10 days supply | Qty: 60 | Fill #1

## 2018-03-05 MED FILL — CALCITRIOL 0.25 MCG CAPS: 0.25 | 15 days supply | Qty: 30 | Fill #0

## 2018-03-13 ENCOUNTER — Encounter: Payer: Self-pay | Admitting: Family Medicine

## 2018-03-13 ENCOUNTER — Other Ambulatory Visit: Payer: Self-pay | Admitting: Obstetrics and Gynecology

## 2018-03-13 DIAGNOSIS — N939 Abnormal uterine and vaginal bleeding, unspecified: Secondary | ICD-10-CM

## 2018-03-13 NOTE — Telephone Encounter (Signed)
This request should go to Dr. Mariane Masters.

## 2018-03-14 ENCOUNTER — Other Ambulatory Visit: Payer: Self-pay | Admitting: General Practice

## 2018-03-14 DIAGNOSIS — N939 Abnormal uterine and vaginal bleeding, unspecified: Secondary | ICD-10-CM

## 2018-03-14 MED ORDER — MEGESTROL ACETATE 40 MG PO TABS: 40.0000 mg | ORAL_TABLET | Freq: Two times a day (BID) | ORAL | 4 refills | Status: DC

## 2018-03-14 MED FILL — MEGESTROL 40 MG TABLET: 40 | 30 days supply | Qty: 60 | Fill #0

## 2018-03-15 ENCOUNTER — Other Ambulatory Visit: Payer: Self-pay | Admitting: Radiology

## 2018-03-18 ENCOUNTER — Observation Stay (HOSPITAL_COMMUNITY)
Admission: RE | Admit: 2018-03-18 | Discharge: 2018-03-19 | Disposition: A | Discharge status: Home / Self Care | Payer: Self-pay | Source: Ambulatory Visit | Attending: Interventional Radiology | Admitting: Interventional Radiology

## 2018-03-18 ENCOUNTER — Encounter (HOSPITAL_COMMUNITY): Payer: Self-pay

## 2018-03-18 ENCOUNTER — Other Ambulatory Visit: Payer: Self-pay

## 2018-03-18 ENCOUNTER — Ambulatory Visit (HOSPITAL_COMMUNITY)
Admission: RE | Admit: 2018-03-18 | Discharge: 2018-03-18 | Disposition: A | Discharge status: Home / Self Care | Payer: Self-pay | Source: Ambulatory Visit | Attending: Interventional Radiology | Admitting: Interventional Radiology

## 2018-03-18 VITALS — BP 145/68 | HR 58 | Temp 98.2°F | Resp 15 | Ht 63.0 in | Wt 155.6 lb

## 2018-03-18 DIAGNOSIS — E559 Vitamin D deficiency, unspecified: Secondary | ICD-10-CM | POA: Insufficient documentation

## 2018-03-18 DIAGNOSIS — D649 Anemia, unspecified: Secondary | ICD-10-CM | POA: Insufficient documentation

## 2018-03-18 DIAGNOSIS — N2581 Secondary hyperparathyroidism of renal origin: Secondary | ICD-10-CM | POA: Insufficient documentation

## 2018-03-18 DIAGNOSIS — D219 Benign neoplasm of connective and other soft tissue, unspecified: Secondary | ICD-10-CM | POA: Diagnosis present

## 2018-03-18 DIAGNOSIS — D259 Leiomyoma of uterus, unspecified: Principal | ICD-10-CM | POA: Insufficient documentation

## 2018-03-18 DIAGNOSIS — E210 Primary hyperparathyroidism: Secondary | ICD-10-CM | POA: Insufficient documentation

## 2018-03-18 DIAGNOSIS — K219 Gastro-esophageal reflux disease without esophagitis: Secondary | ICD-10-CM | POA: Insufficient documentation

## 2018-03-18 HISTORY — PX: IR EMBO TUMOR ORGAN ISCHEMIA INFARCT INC GUIDE ROADMAPPING: IMG5449

## 2018-03-18 HISTORY — PX: IR US GUIDE VASC ACCESS RIGHT: IMG2390

## 2018-03-18 HISTORY — PX: IR ANGIOGRAM SELECTIVE EACH ADDITIONAL VESSEL: IMG667

## 2018-03-18 HISTORY — PX: IR ANGIOGRAM PELVIS SELECTIVE OR SUPRASELECTIVE: IMG661

## 2018-03-18 LAB — CBC WITH DIFFERENTIAL/PLATELET
Abs Immature Granulocytes: 0.03 10*3/uL (ref 0.00–0.07)
Basophils Absolute: 0.1 10*3/uL (ref 0.0–0.1)
Basophils Relative: 1 %
Eosinophils Absolute: 0.2 10*3/uL (ref 0.0–0.5)
Eosinophils Relative: 2 %
HEMATOCRIT: 36.4 % (ref 36.0–46.0)
HEMOGLOBIN: 11.6 g/dL — AB (ref 12.0–15.0)
Immature Granulocytes: 0 %
Lymphocytes Relative: 40 %
Lymphs Abs: 4 10*3/uL (ref 0.7–4.0)
MCH: 24 pg — ABNORMAL LOW (ref 26.0–34.0)
MCHC: 31.9 g/dL (ref 30.0–36.0)
MCV: 75.4 fL — ABNORMAL LOW (ref 80.0–100.0)
Monocytes Absolute: 0.6 10*3/uL (ref 0.1–1.0)
Monocytes Relative: 6 %
Neutro Abs: 5.2 10*3/uL (ref 1.7–7.7)
Neutrophils Relative %: 51 %
Platelets: 359 10*3/uL (ref 150–400)
RBC: 4.83 MIL/uL (ref 3.87–5.11)
RDW: 15.9 % — ABNORMAL HIGH (ref 11.5–15.5)
WBC: 10 10*3/uL (ref 4.0–10.5)
nRBC: 0 % (ref 0.0–0.2)

## 2018-03-18 LAB — COMPREHENSIVE METABOLIC PANEL
ALK PHOS: 123 U/L (ref 38–126)
ALT: 19 U/L (ref 0–44)
AST: 19 U/L (ref 15–41)
Albumin: 4.1 g/dL (ref 3.5–5.0)
Anion gap: 7 (ref 5–15)
BILIRUBIN TOTAL: 0.9 mg/dL (ref 0.3–1.2)
BUN: 11 mg/dL (ref 6–20)
CALCIUM: 8.6 mg/dL — AB (ref 8.9–10.3)
CO2: 21 mmol/L — ABNORMAL LOW (ref 22–32)
CREATININE: 0.9 mg/dL (ref 0.44–1.00)
Chloride: 111 mmol/L (ref 98–111)
GFR calc Af Amer: >60 mL/min (ref >60)
GFR calc non Af Amer: >60 mL/min (ref >60)
Glucose, Bld: 89 mg/dL (ref 70–99)
Potassium: 3.6 mmol/L (ref 3.5–5.1)
Sodium: 139 mmol/L (ref 135–145)
Total Protein: 7 g/dL (ref 6.5–8.1)

## 2018-03-18 LAB — PROTIME-INR
INR: 0.99
Prothrombin Time: 13 seconds (ref 11.4–15.2)

## 2018-03-18 LAB — HCG, SERUM, QUALITATIVE: Preg, Serum: NEGATIVE

## 2018-03-18 MED ORDER — DIPHENHYDRAMINE HCL 12.5 MG/5ML PO ELIX
12.5000 mg | ORAL_SOLUTION | Freq: Four times a day (QID) | ORAL | Status: DC | PRN
  Filled 2018-03-18: qty 5

## 2018-03-18 MED ORDER — MIDAZOLAM HCL 2 MG/2ML IJ SOLN
INTRAMUSCULAR | Status: AC
  Filled 2018-03-18: qty 6

## 2018-03-18 MED ORDER — DOCUSATE SODIUM 100 MG PO CAPS
100.0000 mg | ORAL_CAPSULE | Freq: Two times a day (BID) | ORAL | Status: DC
  Administered 2018-03-19: 100 mg via ORAL
  Filled 2018-03-18: qty 1

## 2018-03-18 MED ORDER — SODIUM CHLORIDE 0.9% FLUSH: 3.0000 mL | INTRAVENOUS | Status: DC | PRN

## 2018-03-18 MED ORDER — ONDANSETRON HCL 4 MG/2ML IJ SOLN
4.0000 mg | Freq: Four times a day (QID) | INTRAMUSCULAR | Status: DC | PRN
  Administered 2018-03-19: 4 mg via INTRAVENOUS
  Filled 2018-03-18 (×3): qty 2

## 2018-03-18 MED ORDER — CALCIUM CARBONATE-VITAMIN D 500-200 MG-UNIT PO TABS
1.0000 | ORAL_TABLET | Freq: Two times a day (BID) | ORAL | Status: DC
  Administered 2018-03-18: 1 via ORAL
  Filled 2018-03-18 (×2): qty 1

## 2018-03-18 MED ORDER — CEFAZOLIN SODIUM-DEXTROSE 2-4 GM/100ML-% IV SOLN
INTRAVENOUS | Status: AC
  Administered 2018-03-18: 2 g via INTRAVENOUS
  Filled 2018-03-18: qty 100

## 2018-03-18 MED ORDER — MEGESTROL ACETATE 40 MG PO TABS
40.0000 mg | ORAL_TABLET | Freq: Two times a day (BID) | ORAL | Status: DC
  Administered 2018-03-18 – 2018-03-19 (×2): 40 mg via ORAL
  Filled 2018-03-18 (×2): qty 1

## 2018-03-18 MED ORDER — PANTOPRAZOLE SODIUM 40 MG PO TBEC
40.0000 mg | DELAYED_RELEASE_TABLET | Freq: Two times a day (BID) | ORAL | Status: DC
  Administered 2018-03-18 – 2018-03-19 (×2): 40 mg via ORAL
  Filled 2018-03-18: qty 1

## 2018-03-18 MED ORDER — SODIUM CHLORIDE 0.9% FLUSH: 9.0000 mL | INTRAVENOUS | Status: DC | PRN

## 2018-03-18 MED ORDER — CEFAZOLIN SODIUM-DEXTROSE 2-4 GM/100ML-% IV SOLN
2.0000 g | INTRAVENOUS | Status: AC
  Administered 2018-03-18: 2 g via INTRAVENOUS

## 2018-03-18 MED ORDER — IOPAMIDOL (ISOVUE-300) INJECTION 61%: 100.0000 mL | Freq: Once | INTRAVENOUS | Status: DC | PRN

## 2018-03-18 MED ORDER — DIPHENHYDRAMINE HCL 50 MG/ML IJ SOLN: 12.5000 mg | Freq: Four times a day (QID) | INTRAMUSCULAR | Status: DC | PRN

## 2018-03-18 MED ORDER — ONDANSETRON HCL 4 MG/2ML IJ SOLN
4.0000 mg | Freq: Four times a day (QID) | INTRAMUSCULAR | Status: DC | PRN
  Administered 2018-03-18 – 2018-03-19 (×2): 4 mg via INTRAVENOUS

## 2018-03-18 MED ORDER — SODIUM CHLORIDE 0.9 % IV SOLN
INTRAVENOUS | Status: DC
  Administered 2018-03-18: 10:00:00 via INTRAVENOUS

## 2018-03-18 MED ORDER — HYDROMORPHONE 1 MG/ML IV SOLN
INTRAVENOUS | Status: DC
  Administered 2018-03-18: 30 mg via INTRAVENOUS
  Filled 2018-03-18: qty 30

## 2018-03-18 MED ORDER — KETOROLAC TROMETHAMINE 30 MG/ML IJ SOLN
30.0000 mg | INTRAMUSCULAR | Status: AC
  Administered 2018-03-18: 30 mg via INTRAVENOUS
  Filled 2018-03-18: qty 1

## 2018-03-18 MED ORDER — IOHEXOL 300 MG/ML  SOLN
100.0000 mL | Freq: Once | INTRAMUSCULAR | Status: AC | PRN
  Administered 2018-03-18: 50 mL via INTRA_ARTERIAL

## 2018-03-18 MED ORDER — CALCITRIOL 0.25 MCG PO CAPS
0.2500 ug | ORAL_CAPSULE | Freq: Two times a day (BID) | ORAL | Status: DC
  Administered 2018-03-19: 0.25 ug via ORAL
  Filled 2018-03-18 (×2): qty 1

## 2018-03-18 MED ORDER — VITAMIN D (ERGOCALCIFEROL) 1.25 MG (50000 UNIT) PO CAPS
50000.0000 [IU] | ORAL_CAPSULE | ORAL | Status: DC
  Filled 2018-03-18: qty 1

## 2018-03-18 MED ORDER — HYDROXYZINE HCL 25 MG PO TABS
50.0000 mg | ORAL_TABLET | Freq: Three times a day (TID) | ORAL | Status: DC | PRN
  Administered 2018-03-18: 50 mg via ORAL
  Filled 2018-03-18: qty 2

## 2018-03-18 MED ORDER — NALOXONE HCL 0.4 MG/ML IJ SOLN: 0.4000 mg | INTRAMUSCULAR | Status: DC | PRN

## 2018-03-18 MED ORDER — FENTANYL CITRATE (PF) 100 MCG/2ML IJ SOLN
INTRAMUSCULAR | Status: AC
  Filled 2018-03-18: qty 4

## 2018-03-18 MED ORDER — ONDANSETRON HCL 4 MG/2ML IJ SOLN: 4.0000 mg | Freq: Four times a day (QID) | INTRAMUSCULAR | Status: DC | PRN

## 2018-03-18 MED ORDER — DIPHENHYDRAMINE HCL 12.5 MG/5ML PO ELIX: 12.5000 mg | ORAL_SOLUTION | Freq: Four times a day (QID) | ORAL | Status: DC | PRN

## 2018-03-18 MED ORDER — CALCITRIOL 0.25 MCG PO CAPS
0.2500 ug | ORAL_CAPSULE | ORAL | Status: AC
  Administered 2018-03-18: 0.25 ug via ORAL
  Filled 2018-03-18: qty 1

## 2018-03-18 MED ORDER — FENTANYL CITRATE (PF) 100 MCG/2ML IJ SOLN
INTRAMUSCULAR | Status: AC | PRN
  Administered 2018-03-18 (×4): 50 ug via INTRAVENOUS

## 2018-03-18 MED ORDER — MIDAZOLAM HCL 2 MG/2ML IJ SOLN
INTRAMUSCULAR | Status: AC | PRN
  Administered 2018-03-18 (×4): 1 mg via INTRAVENOUS

## 2018-03-18 MED ORDER — PROMETHAZINE HCL 25 MG PO TABS
25.0000 mg | ORAL_TABLET | Freq: Three times a day (TID) | ORAL | Status: DC | PRN
  Administered 2018-03-18: 25 mg via ORAL
  Filled 2018-03-18: qty 1

## 2018-03-18 MED ORDER — IOHEXOL 300 MG/ML  SOLN
100.0000 mL | Freq: Once | INTRAMUSCULAR | Status: AC | PRN
  Administered 2018-03-18: 14 mL via INTRA_ARTERIAL

## 2018-03-18 MED ORDER — PROMETHAZINE HCL 25 MG RE SUPP: 25.0000 mg | Freq: Three times a day (TID) | RECTAL | Status: DC | PRN

## 2018-03-18 MED ORDER — SODIUM CHLORIDE 0.9 % IV SOLN: 250.0000 mL | INTRAVENOUS | Status: DC | PRN

## 2018-03-18 MED ORDER — LIDOCAINE HCL (PF) 1 % IJ SOLN
INTRAMUSCULAR | Status: AC | PRN
  Administered 2018-03-18: 5 mL

## 2018-03-18 MED ORDER — KETOROLAC TROMETHAMINE 15 MG/ML IJ SOLN
15.0000 mg | Freq: Four times a day (QID) | INTRAMUSCULAR | Status: DC
  Administered 2018-03-18 – 2018-03-19 (×4): 15 mg via INTRAVENOUS
  Filled 2018-03-18 (×4): qty 1

## 2018-03-18 MED ORDER — FERROUS SULFATE 325 (65 FE) MG PO TABS
325.0000 mg | ORAL_TABLET | Freq: Every day | ORAL | Status: DC
  Administered 2018-03-18 – 2018-03-19 (×2): 325 mg via ORAL
  Filled 2018-03-18 (×2): qty 1

## 2018-03-18 MED ORDER — HYDROMORPHONE 1 MG/ML IV SOLN
INTRAVENOUS | Status: DC
  Administered 2018-03-18: 1.2 mg via INTRAVENOUS
  Administered 2018-03-18: 1.8 mg via INTRAVENOUS
  Administered 2018-03-18: 3 mg via INTRAVENOUS
  Administered 2018-03-19: 1.4 mg via INTRAVENOUS
  Filled 2018-03-18: qty 25

## 2018-03-18 MED ORDER — LIDOCAINE HCL 1 % IJ SOLN
INTRAMUSCULAR | Status: AC
  Filled 2018-03-18: qty 20

## 2018-03-18 MED ORDER — NITROGLYCERIN IN D5W 100-5 MCG/ML-% IV SOLN
INTRAVENOUS | Status: AC
  Filled 2018-03-18: qty 250

## 2018-03-18 MED ORDER — SODIUM CHLORIDE 0.9% FLUSH
3.0000 mL | Freq: Two times a day (BID) | INTRAVENOUS | Status: DC
  Administered 2018-03-18: 3 mL via INTRAVENOUS

## 2018-03-18 MED ORDER — NITROGLYCERIN 1 MG/10 ML FOR IR/CATH LAB
INTRA_ARTERIAL | Status: AC | PRN
  Administered 2018-03-18 (×2): 100 ug via INTRA_ARTERIAL

## 2018-03-18 MED ORDER — IOHEXOL 300 MG/ML  SOLN
100.0000 mL | Freq: Once | INTRAMUSCULAR | Status: AC | PRN
  Administered 2018-03-18: 23 mL via INTRA_ARTERIAL

## 2018-03-18 NOTE — H&P (Signed)
Referring Physician(s): Ervin,M   Supervising Physician: Marybelle Killings  Patient Status:  [facility] OP TBA  Chief Complaint:  Symptomatic uterine fibroids  Subjective: Patient familiar to IR service from prior consultation with Dr. Barbie Banner on/21/19 to discuss treatment options for symptomatic uterine fibroids.  She was deemed an appropriate candidate for bilateral uterine artery embolization.  She was previously scheduled to undergo procedure on 12/20/2017 but was found to be hypercalcemic. 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  She presents again today to undergo bilateral uterine artery embolization.  She denies fever, headache, cough, abdominal pain, nausea, vomiting.  She does have some dyspnea and chest discomfort with exertion, intermittent back pain and some anterior neck pain from recent surgery.  She also states she notices some occasional blood on toilet tissue when wiping.  Her menorrhagia has diminished on Megace.  Past Medical History:  Diagnosis Date  . Anemia   . Fibroids   . GERD (gastroesophageal reflux disease)   . History of kidney stones   . Neck injury    Past Surgical History:  Procedure Laterality Date  . CESAREAN SECTION    . IR RADIOLOGIST EVAL & MGMT  10/17/2017  . IR RADIOLOGIST EVAL & MGMT  12/20/2017  . PARATHYROIDECTOMY N/A 02/12/2018   Procedure: PARATHYROIDECTOMY;  Surgeon: Fredirick Maudlin, MD;  Location: [facility] ORS;  Service: General;  Laterality: N/A;      Allergies: Pork-derived products  Medications: Prior to Admission medications   Medication Sig Start Date End Date Taking? Authorizing Provider  calcitRIOL (ROCALTROL) 0.25 MCG capsule Take 1 capsule (0.25 mcg total) by mouth 2 (two) times daily. 02/13/18  Yes Tylene Fantasia, PA-C  calcium-vitamin D (OSCAL 500/200 D-3) 500-200 MG-UNIT tablet Take 1  tablet by mouth 2 (two) times daily. 02/13/18  Yes Tylene Fantasia, PA-C  ferrous sulfate 325 (65 FE) MG tablet Take 1 tablet (325 mg total) by mouth daily. 10/15/17  Yes Chancy Milroy, MD  hydrOXYzine (ATARAX/VISTARIL) 50 MG tablet Take 1-2 tablets (50-100 mg total) by mouth every 8 (eight) hours as needed for itching. 02/25/18  Yes Scot Jun, FNP  megestrol (MEGACE) 40 MG tablet Take 1 tablet (40 mg total) by mouth 2 (two) times daily. 03/14/18  Yes Chancy Milroy, MD  ondansetron (ZOFRAN ODT) 4 MG disintegrating tablet Take 1 tablet (4 mg total) by mouth every 8 (eight) hours as needed for nausea or vomiting. 12/08/17  Yes Charlynne Cousins, MD  pantoprazole (PROTONIX) 40 MG tablet Take 1 tablet (40 mg total) by mouth 2 (two) times daily. 02/25/18 02/25/19 Yes Scot Jun, FNP  Vitamin D, Ergocalciferol, (DRISDOL) 1.25 MG (50000 UT) CAPS capsule Take 1 capsule (50,000 Units total) by mouth 2 (two) times a week. Take on Mondays and Thursdays. 01/07/18  Yes Fredirick Maudlin, MD     Vital Signs: BP 135/79   Pulse 68   Temp 99.3 F (37.4 C) (Oral)   Resp 18   SpO2 100%   Physical Exam awake, alert.  Chest clear to auscultation bilaterally.  Heart with regular rate and rhythm.  Abdomen soft, positive bowel sounds, some mild pelvic tenderness to palpation.  No lower extremity edema, intact distal pulses.  Imaging: No results found.  Labs:  CBC: Recent Labs    12/07/17 0544 12/24/17 0941 02/25/18 1159 03/18/18 0943  WBC 8.1 8.2 11.2* 10.0  HGB 10.8* 12.1  11.2 11.6*  HCT 34.7* 40.6 35.0 36.4  PLT 291 357 339 359    COAGS: Recent Labs    12/06/17 0904  INR 0.95  APTT 25    BMP: Recent Labs    12/08/17 0543 12/18/17 1420 12/24/17 0945  02/13/18 0123 02/13/18 0509 02/25/18 1159  NA 136 139 142  --   --   --  139  K 4.1 4.1 4.4  --   --   --  3.9  CL 112* 109 109*  --   --   --  107*  CO2 18* 24 21  --   --   --  17*  GLUCOSE 90 87 86  --   --    --  87  BUN 7 9 9   --   --   --  9  CALCIUM 10.2 12.4* 12.8*   < > 10.4* 10.3  10.5* 9.4  CREATININE 0.65 0.91 0.90  --   --   --  0.75  GFRNONAA >60 80 81  --   --   --  101  GFRAA >60 93 94  --   --   --  117   < > = values in this interval not displayed.    LIVER FUNCTION TESTS: Recent Labs    12/24/17 0945  02/12/18 2115 02/13/18 0123 02/13/18 0509 02/25/18 1159  BILITOT 0.3  --   --   --   --  <0.2  AST 22  --   --   --   --  11  ALT 38*  --   --   --   --  15  ALKPHOS 148*  --   --   --   --  153*  PROT 7.5  --   --   --   --  6.6  ALBUMIN 4.7   < > 3.9 4.1 4.0 4.2   < > = values in this interval not displayed.    Assessment and Plan: Patient with history of symptomatic uterine fibroids and previous consultation with Dr. Barbie Banner on 10/17/2017 to discuss treatment options.  She was deemed an appropriate candidate for bilateral uterine artery embolization and presents today for the procedure.  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 Risks and benefits of procedure were discussed with the patient including, but not limited to bleeding, infection, vascular injury or contrast induced renal failure.  This interventional procedure involves the use of X-rays and because of the nature of the planned procedure, it is possible that we will have prolonged use of X-ray fluoroscopy.  Potential radiation risks to you include (but are not limited to) the following: - A slightly elevated risk for cancer  several years later in life. This risk is typically less than 0.5% percent. This risk is low in comparison to the normal incidence of human cancer, which is 33% for women and 50% for men according to the [place]. - Radiation induced injury can include skin redness, resembling a rash, tissue breakdown / ulcers and hair loss (which can be  temporary or permanent).   The likelihood of either of these occurring depends on the difficulty of the procedure and whether you are sensitive to radiation due to previous procedures, disease, or genetic conditions.   IF your procedure requires a prolonged use of radiation, you will be notified and given written instructions for further  action.  It is your responsibility to monitor the irradiated area for the 2 weeks following the procedure and to notify your physician if you are concerned that you have suffered a radiation induced injury.    All of the patient's questions were answered, patient is agreeable to proceed.  Consent signed and in chart.  Post procedure she will be admitted for overnight observation for pain control.  LABS PENDING  Electronically Signed: D. Rowe Robert, PA-C 03/18/2018, 10:00 AM   I spent a total of 30 minutes at the the patient's bedside AND on the patient's hospital floor or unit, greater than 50% of which was counseling/coordinating care for bilateral uterine artery embolization.

## 2018-03-18 NOTE — Progress Notes (Addendum)
Patient's took home medication without notifying nurse, nurse told patient that we have her medication here and that she shouldn't take medication from home without a MD's order. She said that they told her to bring her medicine from home. A female came to see patient, patient and female were arguing. When RN called into the room there was stuff thrown on the floor, patient up and pacing the room and crying. Security and the [facility] were called. Patient said she wanted a new nurse.

## 2018-03-18 NOTE — Procedures (Signed)
B UFE R groin access Exoseal closure 2 vials 5-700 micron embosphere EBL 0 Comp 0

## 2018-03-19 ENCOUNTER — Telehealth: Payer: Self-pay | Admitting: Family Medicine

## 2018-03-19 ENCOUNTER — Encounter: Payer: Self-pay | Admitting: Family Medicine

## 2018-03-19 MED ORDER — PROMETHAZINE HCL 25 MG PO TABS: 25.0000 mg | ORAL_TABLET | Freq: Three times a day (TID) | ORAL | 0 refills | Status: DC | PRN

## 2018-03-19 MED ORDER — ACETAMINOPHEN-CODEINE #3 300-30 MG PO TABS: 1.0000 | ORAL_TABLET | Freq: Four times a day (QID) | ORAL | 0 refills | Status: AC | PRN

## 2018-03-19 MED ORDER — HYDROCODONE-ACETAMINOPHEN 5-325 MG PO TABS: 1.0000 | ORAL_TABLET | ORAL | 0 refills | Status: DC | PRN

## 2018-03-19 MED ORDER — DOCUSATE SODIUM 100 MG PO CAPS: 100.0000 mg | ORAL_CAPSULE | Freq: Two times a day (BID) | ORAL | 0 refills | Status: DC

## 2018-03-19 MED ORDER — HYDROCODONE-ACETAMINOPHEN 5-325 MG PO TABS
1.0000 | ORAL_TABLET | ORAL | Status: DC | PRN
  Administered 2018-03-19: 1 via ORAL
  Filled 2018-03-19: qty 1

## 2018-03-19 NOTE — Telephone Encounter (Signed)
Patient came in requesting medications (HYDROcodone-acetaminophen (NORCO/VICODIN) 5-325 MG tablet [[id number]], promethazine (PHENERGAN) 25 MG tablet [[id number]],  docusate sodium (COLACE) 100 MG capsule [[id number]]) prescribed by her surgeon to be prescribed by PCP because the surgeon sent over the medications to [facility] pharmacy and they do not carry opioids. Patient would like medications to be sent over to [facility] on [address].   Patient was informed that PCP does not prescribe opioids and that she would be sending alternative.

## 2018-03-19 NOTE — Progress Notes (Signed)
Pt agitated and tearful when I took over for [place]. When I tried to clean up the clothes all over the floor, she agressively grabbed them and threw them on the bed. I explained that it was safety hazard to have clothes on the floor.    Pt called and asked if she could go for a walk. I told her yes, Annamary Rummage will be in here in a few minutes to help you up, he might be a few minutes because he is with another patient. I left the room (pt was still in bed), to give medications in another room.  When I finished the medication, I was given a report from [place] that this pt was up walking by herself, and mumbling that "she hopes she falls and busts her ass." I went to find her and help her walk back to her room  and she was slamming the IV pole against the door on her [place] in. She tripped over her feet and slowly fell on her bottom in the floor of her room. I kept a hand on her arm to help slow the fall. Her boyfriend was in the room. She was wearing her tennis shoes and didn't appear to trip over anything but her feet. She said she was ok, and I helped her up. I did not note any injuries on assessment and vitals were taken.  MD notified. Safety zone portal filled out. AC notified.

## 2018-03-19 NOTE — Discharge Summary (Addendum)
Patient ID: Katie Benson MRN: [id number] DOB/AGE: [birth year] 38 y.o.  Admit date: 03/18/2018 Discharge date: 03/19/2018  Supervising Physician: Katie Benson  Patient Status: [facility] - In-pt  Admission Diagnoses: Symptomatic uterine fibroid  Discharge Diagnoses: Symptomatic uterine fibroid, status post successful bilateral uterine artery embolization on 03/18/2018. Active Problems:   Fibroids  Past Medical History:  Diagnosis Date  . Anemia   . Fibroids   . GERD (gastroesophageal reflux disease)   . History of kidney stones   . Neck injury    Past Surgical History:  Procedure Laterality Date  . CESAREAN SECTION    . IR ANGIOGRAM PELVIS SELECTIVE OR SUPRASELECTIVE  03/18/2018  . IR ANGIOGRAM PELVIS SELECTIVE OR SUPRASELECTIVE  03/18/2018  . IR ANGIOGRAM SELECTIVE EACH ADDITIONAL VESSEL  03/18/2018  . IR ANGIOGRAM SELECTIVE EACH ADDITIONAL VESSEL  03/18/2018  . IR EMBO TUMOR ORGAN ISCHEMIA INFARCT INC GUIDE ROADMAPPING  03/18/2018  . IR RADIOLOGIST EVAL & MGMT  10/17/2017  . IR RADIOLOGIST EVAL & MGMT  12/20/2017  . IR US GUIDE VASC ACCESS RIGHT  03/18/2018  . PARATHYROIDECTOMY N/A 02/12/2018   Procedure: PARATHYROIDECTOMY;  Surgeon: Katie Maudlin, MD;  Location: [facility] ORS;  Service: General;  Laterality: N/A;     Discharged Condition: good  Hospital Course: Ms. Katie Benson  is a 38 year old female with history of symptomatic uterine fibroid and prior consultation with Dr. Barbie Benson on 10/17/2017 to discuss treatment options.  She was deemed an appropriate candidate for bilateral uterine artery embolization and presented for the procedure at [facility] on 03/18/2018.  The procedure was performed without immediate complications and she was admitted for overnight observation for pain control.  Post procedure the patient did have expected pelvic cramping.  She was placed on Dilaudid PCA pump.  On the morning of discharge she  was stable.  She was able to void, ambulate and  tolerate her diet without significant difficulty.  Above was discussed with Dr. Barbie Benson and she was deemed stable for discharge at this time.  She will continue her current home medications.  Prescriptions were called in for hydrocodone, Phenergan, and Colace.  She will follow-up with Dr. Barbie Benson in the IR clinic in 2 to 4 weeks.  She was told to contact our service with any additional questions or concerns.  Consults: none  Significant Diagnostic Studies: Results for orders placed or performed during the hospital encounter of 03/18/18  CBC with Differential/Platelet  Result Value Ref Range   WBC 10.0 4.0 - 10.5 K/uL   RBC 4.83 3.87 - 5.11 MIL/uL   Hemoglobin 11.6 (L) 12.0 - 15.0 g/dL   HCT 36.4 36.0 - 46.0 %   MCV 75.4 (L) 80.0 - 100.0 fL   MCH 24.0 (L) 26.0 - 34.0 pg   MCHC 31.9 30.0 - 36.0 g/dL   RDW 15.9 (H) 11.5 - 15.5 %   Platelets 359 150 - 400 K/uL   nRBC 0.0 0.0 - 0.2 %   Neutrophils Relative % 51 %   Neutro Abs 5.2 1.7 - 7.7 K/uL   Lymphocytes Relative 40 %   Lymphs Abs 4.0 0.7 - 4.0 K/uL   Monocytes Relative 6 %   Monocytes Absolute 0.6 0.1 - 1.0 K/uL   Eosinophils Relative 2 %   Eosinophils Absolute 0.2 0.0 - 0.5 K/uL   Basophils Relative 1 %   Basophils Absolute 0.1 0.0 - 0.1 K/uL   Immature Granulocytes 0 %   Abs Immature Granulocytes 0.03 0.00 - 0.07 K/uL  Comprehensive  metabolic panel  Result Value Ref Range   Sodium 139 135 - 145 mmol/L   Potassium 3.6 3.5 - 5.1 mmol/L   Chloride 111 98 - 111 mmol/L   CO2 21 (L) 22 - 32 mmol/L   Glucose, Bld 89 70 - 99 mg/dL   BUN 11 6 - 20 mg/dL   Creatinine, Ser 0.90 0.44 - 1.00 mg/dL   Calcium 8.6 (L) 8.9 - 10.3 mg/dL   Total Protein 7.0 6.5 - 8.1 g/dL   Albumin 4.1 3.5 - 5.0 g/dL   AST 19 15 - 41 U/L   ALT 19 0 - 44 U/L   Alkaline Phosphatase 123 38 - 126 U/L   Total Bilirubin 0.9 0.3 - 1.2 mg/dL   GFR calc non Af Amer >60 >60 mL/min   GFR calc Af Amer >60 >60 mL/min   Anion gap 7 5 - 15  hCG, serum, qualitative    Result Value Ref Range   Preg, Serum NEGATIVE NEGATIVE  Protime-INR  Result Value Ref Range   Prothrombin Time 13.0 11.4 - 15.2 seconds   INR 0.99      Treatments: Successful bilateral uterine artery embolization 01/17/2019 via IV conscious sedation  Discharge Exam: Blood pressure (!) 145/68, pulse (!) 58, temperature 98.2 F (36.8 C), temperature source Oral, resp. rate 15, height 5\' 3"  (1.6 m), weight 155 lb 10.3 oz (70.6 kg), SpO2 100 %. Awake, alert.  Chest clear to auscultation bilaterally.  Heart with slightly bradycardic but regular rhythm.  Abdomen soft, positive bowel sounds, mildly tender anterior pelvic/left lower quadrant tenderness to palpation.  Puncture site right common femoral artery soft, clean, dry, mildly tender, no hematoma.  Intact distal pulses, no lower extremity edema.  Disposition: Discharge disposition: 01-Home or Self Care       Discharge Instructions    Call MD for:  difficulty breathing, headache or visual disturbances   Complete by:  As directed    Call MD for:  extreme fatigue   Complete by:  As directed    Call MD for:  hives   Complete by:  As directed    Call MD for:  persistant dizziness or light-headedness   Complete by:  As directed    Call MD for:  persistant nausea and vomiting   Complete by:  As directed    Call MD for:  redness, tenderness, or signs of infection (pain, swelling, redness, odor or green/yellow discharge around incision site)   Complete by:  As directed    Call MD for:  severe uncontrolled pain   Complete by:  As directed    Call MD for:  temperature >100.4   Complete by:  As directed    Change dressing (specify)   Complete by:  As directed    May keep Band-Aid to puncture site right groin for the next 1 to 2 days.  May wash site with soap and water..   Diet - low sodium heart healthy   Complete by:  As directed    Driving Restrictions   Complete by:  As directed    No driving for the next 48 hours   Increase  activity slowly   Complete by:  As directed    Lifting restrictions   Complete by:  As directed    No heavy lifting for the next 3 to 4 days   Sexual Activity Restrictions   Complete by:  As directed    No sexual intercourse for 1 week  Allergies as of 03/19/2018      Reactions   Pork-derived Products Hives, Swelling      Medication List    STOP taking these medications   ondansetron 4 MG disintegrating tablet Commonly known as:  ZOFRAN ODT     TAKE these medications   calcitRIOL 0.25 MCG capsule Commonly known as:  ROCALTROL Take 1 capsule (0.25 mcg total) by mouth 2 (two) times daily.   calcium-vitamin D 500-200 MG-UNIT tablet Commonly known as:  OSCAL 500/200 D-3 Take 1 tablet by mouth 2 (two) times daily.   docusate sodium 100 MG capsule Commonly known as:  COLACE Take 1 capsule (100 mg total) by mouth 2 (two) times daily.   ferrous sulfate 325 (65 FE) MG tablet Take 1 tablet (325 mg total) by mouth daily.   HYDROcodone-acetaminophen 5-325 MG tablet Commonly known as:  NORCO/VICODIN Take 1-2 tablets by mouth every 4 (four) hours as needed for severe pain.   hydrOXYzine 50 MG tablet Commonly known as:  ATARAX/VISTARIL Take 1-2 tablets (50-100 mg total) by mouth every 8 (eight) hours as needed for itching.   megestrol 40 MG tablet Commonly known as:  MEGACE Take 1 tablet (40 mg total) by mouth 2 (two) times daily.   naproxen sodium 220 MG tablet Commonly known as:  ALEVE Take 220 mg by mouth daily as needed (pain).   pantoprazole 40 MG tablet Commonly known as:  PROTONIX Take 1 tablet (40 mg total) by mouth 2 (two) times daily.   promethazine 25 MG tablet Commonly known as:  PHENERGAN Take 1 tablet (25 mg total) by mouth every 8 (eight) hours as needed for nausea.   Vitamin D (Ergocalciferol) 1.25 MG (50000 UT) Caps capsule Commonly known as:  DRISDOL Take 1 capsule (50,000 Units total) by mouth 2 (two) times a week. Take on Mondays and Thursdays.             Discharge Care Instructions  (From admission, onward)         Start     Ordered   03/19/18 0000  Change dressing (specify)    Comments:  May keep Band-Aid to puncture site right groin for the next 1 to 2 days.  May wash site with soap and water.Marland Kitchen   03/19/18 1038         Follow-up Information    Hoss, Arnell Sieving, MD Follow up.   Specialties:  Interventional Radiology, Radiology Why:  Radiology will call you with follow-up appointment with Dr. Barbie Benson in 2 to 4 weeks.  Call [phone] or [phone] with any questions. Contact information: [address] [place] [address] [phone]        Chancy Milroy, MD Follow up.   Specialty:  Obstetrics and Gynecology Why:  Follow-up with Dr. Rip Harbour as scheduled Contact information: [address] [place] [address] [phone]            Electronically Signed: D. Rowe Robert, PA-C 03/19/2018, 10:40 AM   I have spent less than 30 minutes discharging [employer].

## 2018-03-19 NOTE — Progress Notes (Signed)
Assessment unchanged. Pt verbalized understanding of dc instructions through teach back including follow up care and when to call MD. Discharged via wc to front entrance accompanied by NT.

## 2018-03-19 NOTE — Telephone Encounter (Signed)
One time exception to fill medications for patient. She was discharged from hospital and medication e-prescribed to [facility] and pharmacy doesn't carry scheduled II medications. I do not prescribed schedule II. Made an exception to dispense 7 days of Tylenol #3. This is a one time prescription only.

## 2018-03-19 NOTE — Addendum Note (Signed)
Addended by: Scot Jun on: 03/19/2018 07:48 PM   Modules accepted: Orders

## 2018-03-19 NOTE — Progress Notes (Signed)
Olen Pel, RN,DD in to see pt.

## 2018-03-19 NOTE — Progress Notes (Signed)
Pt refused bed alarm and educated.

## 2018-03-19 NOTE — Progress Notes (Signed)
Pt has home medications at bedside. Advised pt not to take home meds due to hospital policy. Stated "the doctor told me to bring them. I'm not paying for yours." reassured pt. Pt ask me to leave. AC and DD notified pt upset.

## 2018-03-19 NOTE — Discharge Instructions (Signed)
Uterine Artery Embolization for Fibroids, Care After  This sheet gives you information about how to care for yourself after your procedure. Your health care provider may also give you more specific instructions. If you have problems or questions, contact your health care provider.  What can I expect after the procedure?  After your procedure, it is common to have:  Pelvic cramping. You will be given pain medicine.  Nausea and vomiting. You may be given medicine to help relieve nausea.  Follow these instructions at home:  Incision care  Follow instructions from your health care provider about how to take care of your incision. Make sure you:  Wash your hands with soap and water before you change your bandage (dressing). If soap and water are not available, use hand sanitizer.  Change your dressing as told by your health care provider.  Check your incision area every day for signs of infection. Check for:  More redness, swelling, or pain.  More fluid or blood.  Warmth.  Pus or a bad smell.  Medicines    Take over-the-counter and prescription medicines only as told by your health care provider.  Do not take aspirin. It can cause bleeding.  Do not drive for 24 hours if you were given a medicine to help you relax (sedative).  Do not drive or use heavy machinery while taking prescription pain medicine.  General instructions  Ask your health care provider when you can resume sexual activity.  To prevent or treat constipation while you are taking prescription pain medicine, your health care provider may recommend that you:  Drink enough fluid to keep your urine clear or pale yellow.  Take over-the-counter or prescription medicines.  Eat foods that are high in fiber, such as fresh fruits and vegetables, whole grains, and beans.  Limit foods that are high in fat and processed sugars, such as fried and sweet foods.  Contact a health care provider if:  You have a fever.  You have more redness, swelling, or pain around your  incision site.  You have more fluid or blood coming from your incision site.  Your incision feels warm to the touch.  You have pus or a bad smell coming from your incision.  You have a rash.  You have uncontrolled nausea or you cannot eat or drink anything without vomiting.  Get help right away if:  You have trouble breathing.  You have chest pain.  You have severe abdominal pain.  You have leg pain.  You become dizzy and faint.  Summary  After your procedure, it is common to have pelvic cramping. You will be given pain medicine.  Follow instructions from your health care provider about how to take care of your incision.  Check your incision area every day for signs of infection.  Take over-the-counter and prescription medicines only as told by your health care provider.  This information is not intended to replace advice given to you by your health care provider. Make sure you discuss any questions you have with your health care provider.  Document Released: 12/04/2012 Document Revised: 05/18/2016 Document Reviewed: 05/18/2016  Elsevier Interactive Patient Education  2019 [employer]

## 2018-04-01 MED FILL — PANTOPRAZOLE SOD DR 40 MG T: 40 | 30 days supply | Qty: 60 | Fill #1

## 2018-04-17 ENCOUNTER — Ambulatory Visit
Admission: RE | Admit: 2018-04-17 | Discharge: 2018-04-17 | Disposition: A | Discharge status: Home / Self Care | Payer: No Typology Code available for payment source | Source: Ambulatory Visit | Attending: Radiology | Admitting: Radiology

## 2018-04-17 ENCOUNTER — Other Ambulatory Visit: Payer: Self-pay | Admitting: Interventional Radiology

## 2018-04-17 DIAGNOSIS — D219 Benign neoplasm of connective and other soft tissue, unspecified: Secondary | ICD-10-CM

## 2018-04-17 HISTORY — PX: IR RADIOLOGIST EVAL & MGMT: IMG5224

## 2018-04-17 MED FILL — MEGESTROL 40 MG TABLET: 40 | 30 days supply | Qty: 60 | Fill #1

## 2018-04-17 NOTE — Progress Notes (Signed)
Patient ID: Katie Benson, female   DOB: 10-23-1980, 38 y.o.   MRN: [id number]        Chief Complaint: Patient was seen in consultation today for  Chief Complaint  Patient presents with  . Follow-up    1 mo follow up [place]     at the request of Allred,Darrell K  Referring Physician(s): Allred,Darrell K  History of Present Illness: Katie Benson is a 38 y.o. female who is 1 month status post uterine fibroid embolization.  She is still having significant cramping requiring narcotic use.  She was instructed to refrain from using nonsteroidal anti-inflammatory medication due to the risk of bleeding.  Her bleeding has improved.  She is feeling well otherwise.  Past Medical History:  Diagnosis Date  . Anemia   . Fibroids   . GERD (gastroesophageal reflux disease)   . History of kidney stones   . Neck injury     Past Surgical History:  Procedure Laterality Date  . CESAREAN SECTION    . IR ANGIOGRAM PELVIS SELECTIVE OR SUPRASELECTIVE  03/18/2018  . IR ANGIOGRAM PELVIS SELECTIVE OR SUPRASELECTIVE  03/18/2018  . IR ANGIOGRAM SELECTIVE EACH ADDITIONAL VESSEL  03/18/2018  . IR ANGIOGRAM SELECTIVE EACH ADDITIONAL VESSEL  03/18/2018  . IR EMBO TUMOR ORGAN ISCHEMIA INFARCT INC GUIDE ROADMAPPING  03/18/2018  . IR RADIOLOGIST EVAL & MGMT  10/17/2017  . IR RADIOLOGIST EVAL & MGMT  12/20/2017  . IR US GUIDE VASC ACCESS RIGHT  03/18/2018  . PARATHYROIDECTOMY N/A 02/12/2018   Procedure: PARATHYROIDECTOMY;  Surgeon: Fredirick Maudlin, MD;  Location: [facility] ORS;  Service: General;  Laterality: N/A;    Allergies: Pork-derived products  Medications: Prior to Admission medications   Medication Sig Start Date End Date Taking? Authorizing Provider  docusate sodium (COLACE) 100 MG capsule Take 1 capsule (100 mg total) by mouth 2 (two) times daily. 03/19/18  Yes Scot Jun, FNP  ferrous sulfate 325 (65 FE) MG tablet Take 1 tablet (325 mg total) by mouth daily. 10/15/17  Yes Chancy Milroy, MD  hydrOXYzine (ATARAX/VISTARIL) 50 MG tablet Take 1-2 tablets (50-100 mg total) by mouth every 8 (eight) hours as needed for itching. 02/25/18  Yes Scot Jun, FNP  megestrol (MEGACE) 40 MG tablet Take 1 tablet (40 mg total) by mouth 2 (two) times daily. 03/14/18  Yes Chancy Milroy, MD  naproxen sodium (ALEVE) 220 MG tablet Take 220 mg by mouth daily as needed (pain).   Yes [provider]  pantoprazole (PROTONIX) 40 MG tablet Take 1 tablet (40 mg total) by mouth 2 (two) times daily. 02/25/18 02/25/19 Yes Scot Jun, FNP  calcitRIOL (ROCALTROL) 0.25 MCG capsule Take 1 capsule (0.25 mcg total) by mouth 2 (two) times daily. Patient not taking: Reported on 04/17/2018 02/13/18   Tylene Fantasia, PA-C  calcium-vitamin D (OSCAL 500/200 D-3) 500-200 MG-UNIT tablet Take 1 tablet by mouth 2 (two) times daily. Patient not taking: Reported on 04/17/2018 02/13/18   Tylene Fantasia, PA-C  promethazine (PHENERGAN) 25 MG tablet Take 1 tablet (25 mg total) by mouth every 8 (eight) hours as needed for nausea. Patient not taking: Reported on 04/17/2018 03/19/18   Scot Jun, FNP  Vitamin D, Ergocalciferol, (DRISDOL) 1.25 MG (50000 UT) CAPS capsule Take 1 capsule (50,000 Units total) by mouth 2 (two) times a week. Take on Mondays and Thursdays. Patient not taking: Reported on 04/17/2018 01/07/18   Fredirick Maudlin, MD     Family History  Problem Relation Age of Onset  . Diabetes Sister   . Cancer Maternal Grandmother        ovarian  . Hypercalcemia Mother     Social History   Socioeconomic History  . Marital status: Single    Spouse name: Not on file  . Number of children: Not on file  . Years of education: Not on file  . Highest education level: Not on file  Occupational History  . Not on file  Social Needs  . Financial resource strain: Not on file  . Food insecurity:    Worry: Not on file    Inability: Not on file  . Transportation needs:    Medical: Not  on file    Non-medical: Not on file  Tobacco Use  . Smoking status: Current Every Day Smoker    Packs/day: 0.25    Years: 14.00    Pack years: 3.50    Types: Cigarettes  . Smokeless tobacco: Never Used  Substance and Sexual Activity  . Alcohol use: Yes    Comment: occassionally on weekends  . Drug use: Yes    Types: Marijuana    Comment: occ  . Sexual activity: Yes    Birth control/protection: Condom, None  Lifestyle  . Physical activity:    Days per week: Not on file    Minutes per session: Not on file  . Stress: Not on file  Relationships  . Social connections:    Talks on phone: Not on file    Gets together: Not on file    Attends religious service: Not on file    Active member of club or organization: Not on file    Attends meetings of clubs or organizations: Not on file    Relationship status: Not on file  Other Topics Concern  . Not on file  Social History Narrative  . Not on file      Review of Systems: A 12 point ROS discussed and pertinent positives are indicated in the HPI above.  All other systems are negative.  Review of Systems  No fevers or chills.  No foul discharge.  No nausea or vomiting.  No chest pain.  Vital Signs: BP (!) 139/97   Pulse 81   Temp 99.1 F (37.3 C) (Oral)   Resp 16   Ht 5\' 3"  (1.6 m)   SpO2 100%   BMI 27.57 kg/m   Physical Exam She is in no apparent distress.  Examination of the right groin demonstrates no hematoma or evidence of pseudoaneurysm.  Pulses are intact distally at the right ankle.   Imaging: No results found.  Labs:  CBC: Recent Labs    12/07/17 0544 12/24/17 0941 02/25/18 1159 03/18/18 0943  WBC 8.1 8.2 11.2* 10.0  HGB 10.8* 12.1 11.2 11.6*  HCT 34.7* 40.6 35.0 36.4  PLT 291 357 339 359    COAGS: Recent Labs    12/06/17 0904 03/18/18 0943  INR 0.95 0.99  APTT 25  --     BMP: Recent Labs    12/18/17 1420 12/24/17 0945  02/13/18 0509 02/25/18 1159 03/18/18 0943  NA 139 142  --    --  139 139  K 4.1 4.4  --   --  3.9 3.6  CL 109 109*  --   --  107* 111  CO2 24 21  --   --  17* 21*  GLUCOSE 87 86  --   --  87 89  BUN 9 9  --   --  9 11  CALCIUM 12.4* 12.8*   < > 10.3  10.5* 9.4 8.6*  CREATININE 0.91 0.90  --   --  0.75 0.90  GFRNONAA 80 81  --   --  101 >60  GFRAA 93 94  --   --  117 >60   < > = values in this interval not displayed.    LIVER FUNCTION TESTS: Recent Labs    12/24/17 0945  02/13/18 0123 02/13/18 0509 02/25/18 1159 03/18/18 0943  BILITOT 0.3  --   --   --  <0.2 0.9  AST 22  --   --   --  11 19  ALT 38*  --   --   --  15 19  ALKPHOS 148*  --   --   --  153* 123  PROT 7.5  --   --   --  6.6 7.0  ALBUMIN 4.7   < > 4.1 4.0 4.2 4.1   < > = values in this interval not displayed.    TUMOR MARKERS: No results for input(s): AFPTM, CEA, CA199, CHROMGRNA in the last 8760 hours.  Assessment and Plan:  Ms. Katie Benson has done well after uterine fibroid embolization.  She continues to have some cramping, but I believe this is exacerbated by her inability to take ibuprofen.  I will refill her prescription for narcotics for 2 more weeks and hopefully the pain will subside over that time.  She was instructed to contact us immediately if pain worsens or bleeding worsens.  Thank you for this interesting consult.  I greatly enjoyed meeting Katie Benson and look forward to participating in their care.  A copy of this report was sent to the requesting provider on this date.  Electronically Signed: Art A Katie Benson 04/17/2018, 4:57 PM   I spent a total of 15 minutes face to face in clinical consultation, greater than 50% of which was counseling/coordinating care for uterine fibroid embolization.

## 2018-04-19 ENCOUNTER — Encounter: Payer: Self-pay | Admitting: *Deleted

## 2018-04-26 ENCOUNTER — Other Ambulatory Visit: Payer: Self-pay

## 2018-06-13 ENCOUNTER — Other Ambulatory Visit: Payer: Self-pay | Admitting: Obstetrics and Gynecology

## 2018-06-13 DIAGNOSIS — N939 Abnormal uterine and vaginal bleeding, unspecified: Secondary | ICD-10-CM

## 2018-06-14 MED FILL — MEGESTROL 40 MG TABLET: 40 | 30 days supply | Qty: 60 | Fill #2

## 2018-06-14 MED FILL — PANTOPRAZOLE SOD DR 40 MG T: 40 | 30 days supply | Qty: 60 | Fill #2

## 2018-06-14 NOTE — Telephone Encounter (Signed)
Patient needs appointment to see Dr. Mariane Masters for follow up of fibroids and AUB.

## 2018-06-14 NOTE — Telephone Encounter (Signed)
Pt informed. Pt states that she is being followed by Dr. Barbie Banner with [facility]. She states that she has a follow up with him in 2 months.

## 2018-06-17 MED FILL — hydrOXYzine HCL 50 MG TABS: 50 | 10 days supply | Qty: 60 | Fill #2

## 2018-06-27 ENCOUNTER — Encounter: Payer: Self-pay | Admitting: Family Medicine

## 2018-07-26 ENCOUNTER — Encounter: Payer: Self-pay | Admitting: Family Medicine

## 2018-07-26 MED ORDER — HYDROXYZINE HCL 50 MG PO TABS: 50.0000 mg | ORAL_TABLET | Freq: Three times a day (TID) | ORAL | 2 refills | Status: DC | PRN

## 2018-07-26 MED FILL — hydrOXYzine HCL 50 MG TABS: 50 | 10 days supply | Qty: 60 | Fill #0 | Status: TO

## 2018-07-31 MED FILL — ?PANTOPRAZOLE SODI DR 40MGT: 40 | 30 days supply | Qty: 60 | Fill #3

## 2018-07-31 MED FILL — MEGESTROL 40 MG TABLET: 40 | 30 days supply | Qty: 60 | Fill #3

## 2018-09-04 NOTE — Telephone Encounter (Signed)
Per Dr.Harper please advise in Rx request for Megestrol 40 mg tablet for pt

## 2018-09-10 MED FILL — MEGESTROL 40 MG TABLET: 40 | 30 days supply | Qty: 60 | Fill #4

## 2018-09-10 MED FILL — hydrOXYzine HCL 50 MG TABS: 50 | 10 days supply | Qty: 60 | Fill #1 | Status: TO

## 2018-09-10 MED FILL — FERROUS SULFATE 325 MG TAB: 325 (65 FE) | 30 days supply | Qty: 30 | Fill #1 | Status: TO

## 2018-09-10 MED FILL — ?PANTOPRAZOLE SO DR 40MG TA: 40 | 30 days supply | Qty: 60 | Fill #4

## 2018-09-11 ENCOUNTER — Encounter: Payer: Self-pay | Admitting: Family Medicine

## 2018-09-13 ENCOUNTER — Encounter: Payer: Self-pay | Admitting: Family Medicine

## 2018-09-16 ENCOUNTER — Encounter: Payer: Self-pay | Admitting: Family Medicine

## 2018-09-16 NOTE — Telephone Encounter (Signed)
Pt will need to be seen before new Rx is provided. Thanks Legrand Como

## 2018-09-18 ENCOUNTER — Telehealth: Payer: Self-pay

## 2018-09-18 NOTE — Telephone Encounter (Signed)

## 2018-09-19 ENCOUNTER — Encounter: Payer: Self-pay | Admitting: Family Medicine

## 2018-09-19 ENCOUNTER — Ambulatory Visit (INDEPENDENT_AMBULATORY_CARE_PROVIDER_SITE_OTHER): Payer: Self-pay | Admitting: Family Medicine

## 2018-09-19 ENCOUNTER — Other Ambulatory Visit: Payer: Self-pay

## 2018-09-19 VITALS — BP 134/76 | HR 74 | Temp 97.3°F | Resp 17 | Ht 63.0 in | Wt 163.0 lb

## 2018-09-19 DIAGNOSIS — M25562 Pain in left knee: Secondary | ICD-10-CM

## 2018-09-19 DIAGNOSIS — G8929 Other chronic pain: Secondary | ICD-10-CM

## 2018-09-19 DIAGNOSIS — G4700 Insomnia, unspecified: Secondary | ICD-10-CM

## 2018-09-19 MED ORDER — MELOXICAM 7.5 MG PO TABS: 7.5000 mg | ORAL_TABLET | Freq: Every day | ORAL | 0 refills | Status: DC

## 2018-09-19 MED ORDER — TRAZODONE HCL 50 MG PO TABS: 50.0000 mg | ORAL_TABLET | Freq: Every evening | ORAL | 1 refills | Status: DC | PRN

## 2018-09-19 MED FILL — traZODone HCL 50 MG TABS: 50 | 30 days supply | Qty: 30 | Fill #0

## 2018-09-19 MED FILL — MELOXICAM 7.5 MG TABLET: 7.5 | 30 days supply | Qty: 30 | Fill #0

## 2018-09-19 NOTE — Patient Instructions (Signed)

## 2018-09-19 NOTE — Progress Notes (Signed)
Subjective:  Patient ID: Katie Benson, female    DOB: 06-Nov-1980  Age: 38 y.o. MRN: [id number]  CC: Knee Pain (following up on L knee pain. was referred in Dec 2019 but office was out of network with the orange card)   HPI Katie Benson is a 38 year old female who presents with chronic left knee pain which she has had since 2017 when she had a torn meniscus.  Pain is worse with going up and down stairs and prolonged standing; she also endorses knee swelling and would like to be referred to orthopedics. Pain is not improved with use of ibuprofen. Left knee x-ray from 01/2018 revealed mild osteoarthritis in the lateral compartment  Past Medical History:  Diagnosis Date  . Anemia   . Fibroids   . GERD (gastroesophageal reflux disease)   . History of kidney stones   . Neck injury     Past Surgical History:  Procedure Laterality Date  . CESAREAN SECTION    . IR ANGIOGRAM PELVIS SELECTIVE OR SUPRASELECTIVE  03/18/2018  . IR ANGIOGRAM PELVIS SELECTIVE OR SUPRASELECTIVE  03/18/2018  . IR ANGIOGRAM SELECTIVE EACH ADDITIONAL VESSEL  03/18/2018  . IR ANGIOGRAM SELECTIVE EACH ADDITIONAL VESSEL  03/18/2018  . IR EMBO TUMOR ORGAN ISCHEMIA INFARCT INC GUIDE ROADMAPPING  03/18/2018  . IR RADIOLOGIST EVAL & MGMT  10/17/2017  . IR RADIOLOGIST EVAL & MGMT  12/20/2017  . IR RADIOLOGIST EVAL & MGMT  04/17/2018  . IR US GUIDE VASC ACCESS RIGHT  03/18/2018  . PARATHYROIDECTOMY N/A 02/12/2018   Procedure: PARATHYROIDECTOMY;  Surgeon: Fredirick Maudlin, MD;  Location: [facility] ORS;  Service: General;  Laterality: N/A;    Family History  Problem Relation Age of Onset  . Diabetes Sister   . Cancer Maternal Grandmother        ovarian  . Hypercalcemia Mother     Allergies  Allergen Reactions  . Pork-Derived Products Hives and Swelling    Outpatient Medications Prior to Visit  Medication Sig Dispense Refill  . hydrOXYzine (ATARAX/VISTARIL) 50 MG tablet Take 1-2 tablets (50-100 mg total) by  mouth every 8 (eight) hours as needed for itching. 60 tablet 2  . megestrol (MEGACE) 40 MG tablet TAKE 1 TABLET(40 MG) BY MOUTH TWICE DAILY 60 tablet 4  . naproxen sodium (ALEVE) 220 MG tablet Take 220 mg by mouth daily as needed (pain).    . pantoprazole (PROTONIX) 40 MG tablet Take 1 tablet (40 mg total) by mouth 2 (two) times daily. 60 tablet 11  . calcitRIOL (ROCALTROL) 0.25 MCG capsule Take 1 capsule (0.25 mcg total) by mouth 2 (two) times daily. (Patient not taking: Reported on 04/17/2018) 30 capsule 1  . calcium-vitamin D (OSCAL 500/200 D-3) 500-200 MG-UNIT tablet Take 1 tablet by mouth 2 (two) times daily. (Patient not taking: Reported on 04/17/2018) 30 tablet 1  . docusate sodium (COLACE) 100 MG capsule Take 1 capsule (100 mg total) by mouth 2 (two) times daily. 10 capsule 0  . ferrous sulfate 325 (65 FE) MG tablet Take 1 tablet (325 mg total) by mouth daily. 30 tablet 5  . promethazine (PHENERGAN) 25 MG tablet Take 1 tablet (25 mg total) by mouth every 8 (eight) hours as needed for nausea. (Patient not taking: Reported on 04/17/2018) 18 tablet 0  . Vitamin D, Ergocalciferol, (DRISDOL) 1.25 MG (50000 UT) CAPS capsule Take 1 capsule (50,000 Units total) by mouth 2 (two) times a week. Take on Mondays and Thursdays. (Patient not taking: Reported on 04/17/2018) 8  capsule 1   No facility-administered medications prior to visit.      ROS Review of Systems  Constitutional: Negative for activity change, appetite change and fatigue.  HENT: Negative for congestion, sinus pressure and sore throat.   Eyes: Negative for visual disturbance.  Respiratory: Negative for cough, chest tightness, shortness of breath and wheezing.   Cardiovascular: Negative for chest pain and palpitations.  Gastrointestinal: Negative for abdominal distention, abdominal pain and constipation.  Endocrine: Negative for polydipsia.  Genitourinary: Negative for dysuria and frequency.  Musculoskeletal:       See HPI  Skin:  Negative for rash.  Neurological: Negative for tremors, light-headedness and numbness.  Hematological: Does not bruise/bleed easily.  Psychiatric/Behavioral: Negative for agitation and behavioral problems.    Objective:  BP 134/76   Pulse 74   Temp (!) 97.3 F (36.3 C) (Temporal)   Resp 17   Ht 5\' 3"  (1.6 m)   Wt 163 lb (73.9 kg)   SpO2 97%   BMI 28.87 kg/m   BP/Weight 09/19/2018 04/17/2018 5/78/4696  Systolic BP 295 284 132  Diastolic BP 76 97 68  Wt. (Lbs) 163 - -  BMI 28.87 27.57 -      Physical Exam Constitutional:      Appearance: She is well-developed.  Cardiovascular:     Rate and Rhythm: Normal rate.     Heart sounds: Normal heart sounds. No murmur.  Pulmonary:     Effort: Pulmonary effort is normal.     Breath sounds: Normal breath sounds. No wheezing or rales.  Chest:     Chest wall: No tenderness.  Abdominal:     General: Bowel sounds are normal. There is no distension.     Palpations: Abdomen is soft. There is no mass.     Tenderness: There is no abdominal tenderness.  Musculoskeletal:     Comments: With tenderness on superolateral and inferolateral aspects of left knee joint Tenderness also on active range of motion. Normal gait  Neurological:     Mental Status: She is alert and oriented to person, place, and time.     CMP Latest Ref Rng & Units 03/18/2018 02/25/2018 02/13/2018  Glucose 70 - 99 mg/dL 89 87 -  BUN 6 - 20 mg/dL 11 9 -  Creatinine 0.44 - 1.00 mg/dL 0.90 0.75 -  Sodium 135 - 145 mmol/L 139 139 -  Potassium 3.5 - 5.1 mmol/L 3.6 3.9 -  Chloride 98 - 111 mmol/L 111 107(H) -  CO2 22 - 32 mmol/L 21(L) 17(L) -  Calcium 8.9 - 10.3 mg/dL 8.6(L) 9.4 10.3  Total Protein 6.5 - 8.1 g/dL 7.0 6.6 -  Total Bilirubin 0.3 - 1.2 mg/dL 0.9 <0.2 -  Alkaline Phos 38 - 126 U/L 123 153(H) -  AST 15 - 41 U/L 19 11 -  ALT 0 - 44 U/L 19 15 -    Lipid Panel  No results found for: CHOL, TRIG, HDL, CHOLHDL, VLDL, LDLCALC, LDLDIRECT  CBC    Component  Value Date/Time   WBC 10.0 03/18/2018 0943   RBC 4.83 03/18/2018 0943   HGB 11.6 (L) 03/18/2018 0943   HGB 11.2 02/25/2018 1159   HCT 36.4 03/18/2018 0943   HCT 35.0 02/25/2018 1159   PLT 359 03/18/2018 0943   PLT 339 02/25/2018 1159   MCV 75.4 (L) 03/18/2018 0943   MCV 77 (L) 02/25/2018 1159   MCH 24.0 (L) 03/18/2018 0943   MCHC 31.9 03/18/2018 0943   RDW 15.9 (H) 03/18/2018 4401  RDW 15.9 (H) 02/25/2018 1159   LYMPHSABS 4.0 03/18/2018 0943   LYMPHSABS 3.7 (H) 02/25/2018 1159   MONOABS 0.6 03/18/2018 0943   EOSABS 0.2 03/18/2018 0943   EOSABS 0.2 02/25/2018 1159   BASOSABS 0.1 03/18/2018 0943   BASOSABS 0.1 02/25/2018 1159    No results found for: HGBA1C  Assessment & Plan:   1. Chronic pain of left knee Uncontrolled Advised to apply ice, use knee brace - Ambulatory referral to Orthopedic Surgery - meloxicam (MOBIC) 7.5 MG tablet; Take 1 tablet (7.5 mg total) by mouth daily.  Dispense: 30 tablet; Refill: 0  2. Insomnia After she had left the Clinic she returned requesting a medication for insomnia Trazone prescribed.  Meds ordered this encounter  Medications  . meloxicam (MOBIC) 7.5 MG tablet    Sig: Take 1 tablet (7.5 mg total) by mouth daily.    Dispense:  30 tablet    Refill:  0    Follow-up: Return in about 3 months (around 12/20/2018), or if symptoms worsen or fail to improve, for chronic medical conditions.       Charlott Rakes, MD, FAAFP. [facility] [place], [place]   09/19/2018, 9:45 AM

## 2018-09-20 NOTE — Telephone Encounter (Signed)
LM for pt to returncall

## 2018-10-08 NOTE — Telephone Encounter (Signed)
Pt informed. Pt states that she did not request this, and does not need anymore

## 2018-10-24 ENCOUNTER — Other Ambulatory Visit: Payer: Self-pay | Admitting: Interventional Radiology

## 2018-10-24 DIAGNOSIS — D250 Submucous leiomyoma of uterus: Secondary | ICD-10-CM

## 2018-11-15 ENCOUNTER — Ambulatory Visit: Payer: Self-pay

## 2018-11-15 ENCOUNTER — Encounter: Payer: Self-pay | Admitting: Family Medicine

## 2018-11-15 ENCOUNTER — Encounter: Payer: Self-pay | Admitting: Orthopaedic Surgery

## 2018-11-15 ENCOUNTER — Other Ambulatory Visit: Payer: Self-pay

## 2018-11-15 ENCOUNTER — Ambulatory Visit (INDEPENDENT_AMBULATORY_CARE_PROVIDER_SITE_OTHER): Payer: Self-pay | Admitting: Orthopaedic Surgery

## 2018-11-15 VITALS — BP 149/85 | HR 89 | Ht 63.0 in | Wt 157.0 lb

## 2018-11-15 DIAGNOSIS — G8929 Other chronic pain: Secondary | ICD-10-CM

## 2018-11-15 DIAGNOSIS — M25562 Pain in left knee: Secondary | ICD-10-CM | POA: Insufficient documentation
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
## 2018-12-10 ENCOUNTER — Ambulatory Visit: Payer: Self-pay | Admitting: Orthopaedic Surgery

## 2018-12-10 ENCOUNTER — Ambulatory Visit
Admission: RE | Admit: 2018-12-10 | Discharge: 2018-12-10 | Disposition: A | Discharge status: Home / Self Care | Payer: No Typology Code available for payment source | Source: Ambulatory Visit | Attending: Orthopaedic Surgery | Admitting: Orthopaedic Surgery

## 2018-12-10 ENCOUNTER — Other Ambulatory Visit: Payer: Self-pay

## 2018-12-10 DIAGNOSIS — G8929 Other chronic pain: Secondary | ICD-10-CM

## 2018-12-10 DIAGNOSIS — M25562 Pain in left knee: Secondary | ICD-10-CM

## 2018-12-12 ENCOUNTER — Ambulatory Visit (INDEPENDENT_AMBULATORY_CARE_PROVIDER_SITE_OTHER): Payer: Self-pay | Admitting: Orthopaedic Surgery

## 2018-12-12 ENCOUNTER — Other Ambulatory Visit: Payer: Self-pay

## 2018-12-12 ENCOUNTER — Other Ambulatory Visit: Payer: Self-pay | Admitting: Family Medicine

## 2018-12-12 ENCOUNTER — Encounter: Payer: Self-pay | Admitting: Orthopaedic Surgery

## 2018-12-12 DIAGNOSIS — G8929 Other chronic pain: Secondary | ICD-10-CM

## 2018-12-12 DIAGNOSIS — S83252A Bucket-handle tear of lateral meniscus, current injury, left knee, initial encounter: Secondary | ICD-10-CM | POA: Insufficient documentation

## 2018-12-12 DIAGNOSIS — S83252D Bucket-handle tear of lateral meniscus, current injury, left knee, subsequent encounter: Secondary | ICD-10-CM

## 2018-12-12 DIAGNOSIS — M6740 Ganglion, unspecified site: Secondary | ICD-10-CM | POA: Insufficient documentation

## 2018-12-12 MED ORDER — MELOXICAM 7.5 MG PO TABS: 7.5000 mg | ORAL_TABLET | Freq: Every day | ORAL | 1 refills | Status: DC

## 2018-12-12 MED ORDER — TRAMADOL HCL 50 MG PO TABS: 50.0000 mg | ORAL_TABLET | Freq: Four times a day (QID) | ORAL | 0 refills | Status: DC | PRN

## 2018-12-12 MED FILL — traZODone HCL 50 MG TABS: 50 | 30 days supply | Qty: 30 | Fill #1
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
## 2018-12-13 MED FILL — MELOXICAM 7.5 MG TABLET: 7.5 | 30 days supply | Qty: 30 | Fill #0

## 2018-12-16 ENCOUNTER — Ambulatory Visit: Payer: Self-pay | Admitting: Family Medicine

## 2018-12-16 NOTE — Telephone Encounter (Signed)
Ok to renew?  

## 2018-12-16 NOTE — Telephone Encounter (Signed)
Please advise 
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
## 2018-12-18 NOTE — Patient Instructions (Addendum)
DUE TO COVID-19 ONLY ONE VISITOR IS ALLOWED TO COME WITH YOU AND STAY IN THE WAITING ROOM ONLY DURING PRE OP AND PROCEDURE DAY OF SURGERY. THE 1 VISITOR MAY VISIT WITH YOU AFTER SURGERY IN YOUR PRIVATE ROOM DURING VISITING HOURS ONLY!  YOU NEED TO HAVE A COVID 19 TEST ON_Friday 10/23/2020_____ @__210  pm_____, THIS TEST MUST BE DONE BEFORE SURGERY, COME  [address] [place] , [address].  ([place]) ONCE YOUR COVID TEST IS COMPLETED, PLEASE BEGIN THE QUARANTINE INSTRUCTIONS AS OUTLINED IN YOUR HANDOUT.                Shealynn Blanken    Your procedure is scheduled on: Tuesday 12/24/2018   Report to [facility] Main  Entrance    Report to admitting at  0810  AM     Call this number if you have problems the morning of surgery [phone]    Remember: Do not eat food  :After Midnight.    NO SOLID FOOD AFTER MIDNIGHT THE NIGHT PRIOR TO SURGERY. NOTHING BY MOUTH EXCEPT CLEAR LIQUIDS UNTIL 0710 am .    PLEASE FINISH ENSURE DRINK PER SURGEON ORDER  WHICH NEEDS TO BE COMPLETED AT  0710 am .   CLEAR LIQUID DIET   Foods Allowed                                                                     Foods Excluded  Coffee and tea, regular and decaf                             liquids that you cannot  Plain Jell-O any favor except red or purple                                           see through such as: Fruit ices (not with fruit pulp)                                     milk, soups, orange juice  Iced Popsicles                                    All solid food Carbonated beverages, regular and diet                                    Cranberry, grape and apple juices Sports drinks like Gatorade Lightly seasoned clear broth or consume(fat free) Sugar, honey syrup  Sample Menu Breakfast                                Lunch  Supper Cranberry juice                    Beef broth                            Chicken  broth Jell-O                                     Grape juice                           Apple juice Coffee or tea                        Jell-O                                      Popsicle                                                Coffee or tea                        Coffee or tea  _____________________________________________________________________     BRUSH YOUR TEETH MORNING OF SURGERY AND RINSE YOUR MOUTH OUT, NO CHEWING GUM CANDY OR MINTS.     Take these medicines the morning of surgery with A SIP OF WATER: none                                 You may not have any metal on your body including hair pins and              piercings  Do not wear jewelry, make-up, lotions, powders or perfumes, deodorant             Do not wear nail polish on your fingernails.  Do not shave  48 hours prior to surgery.               Do not bring valuables to the hospital. [place].  Contacts, dentures or bridgework may not be worn into surgery.  Leave suitcase in the car. After surgery it may be brought to your room.     Patients discharged the day of surgery will not be allowed to drive home. IF YOU ARE HAVING SURGERY AND GOING HOME THE SAME DAY, YOU MUST HAVE AN ADULT TO DRIVE YOU HOME AND BE WITH YOU FOR 24 HOURS. YOU MAY GO HOME BY TAXI OR UBER OR ORTHERWISE, BUT AN ADULT MUST ACCOMPANY YOU HOME AND STAY WITH YOU FOR 24 HOURS.  Name and phone number of your driver: friend- Donella Stade  [phone]               Please read over the following fact sheets you were given: _____________________________________________________________________             [facility] - Preparing for Surgery Before surgery, you can play an important  role.  Because skin is not sterile, your skin needs to be as free of germs as possible.  You can reduce the number of germs on your skin by washing with CHG (chlorahexidine gluconate) soap before surgery.  CHG is  an antiseptic cleaner which kills germs and bonds with the skin to continue killing germs even after washing. Please DO NOT use if you have an allergy to CHG or antibacterial soaps.  If your skin becomes reddened/irritated stop using the CHG and inform your nurse when you arrive at Short Stay. Do not shave (including legs and underarms) for at least 48 hours prior to the first CHG shower.  You may shave your face/neck. Please follow these instructions carefully:  1.  Shower with CHG Soap the night before surgery and the  morning of Surgery.  2.  If you choose to wash your hair, wash your hair first as usual with your  normal  shampoo.  3.  After you shampoo, rinse your hair and body thoroughly to remove the  shampoo.                           4.  Use CHG as you would any other liquid soap.  You can apply chg directly  to the skin and wash                       Gently with a scrungie or clean washcloth.  5.  Apply the CHG Soap to your body ONLY FROM THE NECK DOWN.   Do not use on face/ open                           Wound or open sores. Avoid contact with eyes, ears mouth and genitals (private parts).                       Wash face,  Genitals (private parts) with your normal soap.             6.  Wash thoroughly, paying special attention to the area where your surgery  will be performed.  7.  Thoroughly rinse your body with warm water from the neck down.  8.  DO NOT shower/wash with your normal soap after using and rinsing off  the CHG Soap.                9.  Pat yourself dry with a clean towel.            10.  Wear clean pajamas.            11.  Place clean sheets on your bed the night of your first shower and do not  sleep with pets. Day of Surgery : Do not apply any lotions/deodorants the morning of surgery.  Please wear clean clothes to the hospital/surgery center.  FAILURE TO FOLLOW THESE INSTRUCTIONS MAY RESULT IN THE CANCELLATION OF YOUR SURGERY PATIENT  SIGNATURE_________________________________  NURSE SIGNATURE__________________________________  ________________________________________________________________________   Adam Phenix  An incentive spirometer is a tool that can help keep your lungs clear and active. This tool measures how well you are filling your lungs with each breath. Taking long deep breaths may help reverse or decrease the chance of developing breathing (pulmonary) problems (especially infection) following:  A long period of time when you are unable to move or be active. BEFORE THE PROCEDURE  If the spirometer includes an indicator to show your best effort, your nurse or respiratory therapist will set it to a desired goal.  If possible, sit up straight or lean slightly forward. Try not to slouch.  Hold the incentive spirometer in an upright position. INSTRUCTIONS FOR USE  1. Sit on the edge of your bed if possible, or sit up as far as you can in bed or on a chair. 2. Hold the incentive spirometer in an upright position. 3. Breathe out normally. 4. Place the mouthpiece in your mouth and seal your lips tightly around it. 5. Breathe in slowly and as deeply as possible, raising the piston or the ball toward the top of the column. 6. Hold your breath for 3-5 seconds or for as long as possible. Allow the piston or ball to fall to the bottom of the column. 7. Remove the mouthpiece from your mouth and breathe out normally. 8. Rest for a few seconds and repeat Steps 1 through 7 at least 10 times every 1-2 hours when you are awake. Take your time and take a few normal breaths between deep breaths. 9. The spirometer may include an indicator to show your best effort. Use the indicator as a goal to work toward during each repetition. 10. After each set of 10 deep breaths, practice coughing to be sure your lungs are clear. If you have an incision (the cut made at the time of surgery), support your incision when coughing  by placing a pillow or rolled up towels firmly against it. Once you are able to get out of bed, walk around indoors and cough well. You may stop using the incentive spirometer when instructed by your caregiver.  RISKS AND COMPLICATIONS  Take your time so you do not get dizzy or light-headed.  If you are in pain, you may need to take or ask for pain medication before doing incentive spirometry. It is harder to take a deep breath if you are having pain. AFTER USE  Rest and breathe slowly and easily.  It can be helpful to keep track of a log of your progress. Your caregiver can provide you with a simple table to help with this. If you are using the spirometer at home, follow these instructions: [place] IF:   You are having difficultly using the spirometer.  You have trouble using the spirometer as often as instructed.  Your pain medication is not giving enough relief while using the spirometer.  You develop fever of 100.5 F (38.1 C) or higher. SEEK IMMEDIATE MEDICAL CARE IF:   You cough up bloody sputum that had not been present before.  You develop fever of 102 F (38.9 C) or greater.  You develop worsening pain at or near the incision site. MAKE SURE YOU:   Understand these instructions.  Will watch your condition.  Will get help right away if you are not doing well or get worse. Document Released: 06/26/2006 Document Revised: 05/08/2011 Document Reviewed: 08/27/2006 [facility] Patient Information 2014 [place], [place].   ________________________________________________________________________

## 2018-12-19 ENCOUNTER — Encounter (HOSPITAL_COMMUNITY)
Admission: RE | Admit: 2018-12-19 | Discharge: 2018-12-19 | Disposition: A | Discharge status: Home / Self Care | Payer: Self-pay | Source: Ambulatory Visit | Attending: Orthopaedic Surgery | Admitting: Orthopaedic Surgery

## 2018-12-19 ENCOUNTER — Other Ambulatory Visit: Payer: Self-pay

## 2018-12-19 ENCOUNTER — Encounter (HOSPITAL_COMMUNITY): Payer: Self-pay

## 2018-12-19 DIAGNOSIS — M67462 Ganglion, left knee: Secondary | ICD-10-CM | POA: Insufficient documentation

## 2018-12-19 DIAGNOSIS — Y929 Unspecified place or not applicable: Secondary | ICD-10-CM | POA: Insufficient documentation

## 2018-12-19 DIAGNOSIS — Z01812 Encounter for preprocedural laboratory examination: Secondary | ICD-10-CM | POA: Insufficient documentation

## 2018-12-19 DIAGNOSIS — T1490XA Injury, unspecified, initial encounter: Secondary | ICD-10-CM | POA: Insufficient documentation

## 2018-12-19 DIAGNOSIS — S83282A Other tear of lateral meniscus, current injury, left knee, initial encounter: Secondary | ICD-10-CM | POA: Insufficient documentation

## 2018-12-19 LAB — SURGICAL PCR SCREEN
MRSA, PCR: NEGATIVE
Staphylococcus aureus: NEGATIVE

## 2018-12-19 LAB — CBC
HCT: 40.1 % (ref 36.0–46.0)
Hemoglobin: 12.4 g/dL (ref 12.0–15.0)
MCH: 23.3 pg — ABNORMAL LOW (ref 26.0–34.0)
MCHC: 30.9 g/dL (ref 30.0–36.0)
MCV: 75.2 fL — ABNORMAL LOW (ref 80.0–100.0)
Platelets: 375 10*3/uL (ref 150–400)
RBC: 5.33 MIL/uL — ABNORMAL HIGH (ref 3.87–5.11)
RDW: 15.3 % (ref 11.5–15.5)
WBC: 11.1 10*3/uL — ABNORMAL HIGH (ref 4.0–10.5)
nRBC: 0 % (ref 0.0–0.2)

## 2018-12-19 MED ORDER — ENSURE PRE-SURGERY PO LIQD
296.0000 mL | Freq: Once | ORAL | Status: DC
  Filled 2018-12-19: qty 296

## 2018-12-19 NOTE — Progress Notes (Signed)
PCP - Dr. Charlott Rakes Cardiologist - none  Chest x-ray - none EKG - 02/25/2018 Stress Test - none ECHO - none Cardiac Cath - none  Sleep Study - none CPAP - none  Fasting Blood Sugar - none Checks Blood Sugar _____ times a day  Blood Thinner Instructions:noone Aspirin Instructions:none Last Dose:none Patient takes Ibuprofen as needed for pain. Patient reports she has not stopped it. Instructed patient to call Dr. Rudene Anda office to get instructions as to when to stop the Ibuprofen. Patient verbalized understanding.  Anesthesia review:   Patient denies shortness of breath, fever, cough and chest pain at PAT appointment   Patient verbalized understanding of instructions that were given to them at the PAT appointment. Patient was also instructed that they will need to review over the PAT instructions again at home before surgery.

## 2018-12-20 ENCOUNTER — Other Ambulatory Visit (HOSPITAL_COMMUNITY)
Admission: RE | Admit: 2018-12-20 | Discharge: 2018-12-20 | Disposition: A | Discharge status: Home / Self Care | Payer: Self-pay | Source: Ambulatory Visit | Attending: Orthopaedic Surgery | Admitting: Orthopaedic Surgery

## 2018-12-20 DIAGNOSIS — Z01812 Encounter for preprocedural laboratory examination: Secondary | ICD-10-CM | POA: Insufficient documentation

## 2018-12-20 DIAGNOSIS — Z20828 Contact with and (suspected) exposure to other viral communicable diseases: Secondary | ICD-10-CM | POA: Insufficient documentation

## 2018-12-23 LAB — NOVEL CORONAVIRUS, NAA (HOSP ORDER, SEND-OUT TO REF LAB; TAT 18-24 HRS): SARS-CoV-2, NAA: NOT DETECTED

## 2018-12-24 ENCOUNTER — Encounter: Payer: Self-pay | Admitting: Orthopaedic Surgery

## 2018-12-24 ENCOUNTER — Ambulatory Visit (HOSPITAL_COMMUNITY): Payer: Self-pay | Admitting: Anesthesiology

## 2018-12-24 ENCOUNTER — Encounter (HOSPITAL_COMMUNITY): Payer: Self-pay | Admitting: Anesthesiology

## 2018-12-24 ENCOUNTER — Ambulatory Visit: Payer: Self-pay

## 2018-12-24 ENCOUNTER — Telehealth: Payer: Self-pay

## 2018-12-24 ENCOUNTER — Encounter (HOSPITAL_COMMUNITY)
Admission: RE | Disposition: A | Discharge status: Home / Self Care | Payer: Self-pay | Source: Home / Self Care | Attending: Orthopaedic Surgery

## 2018-12-24 ENCOUNTER — Ambulatory Visit (HOSPITAL_COMMUNITY)
Admission: RE | Admit: 2018-12-24 | Discharge: 2018-12-24 | Disposition: A | Discharge status: Home / Self Care | Payer: Self-pay | Attending: Orthopaedic Surgery | Admitting: Orthopaedic Surgery

## 2018-12-24 ENCOUNTER — Other Ambulatory Visit: Payer: Self-pay

## 2018-12-24 DIAGNOSIS — K219 Gastro-esophageal reflux disease without esophagitis: Secondary | ICD-10-CM | POA: Insufficient documentation

## 2018-12-24 DIAGNOSIS — S83252D Bucket-handle tear of lateral meniscus, current injury, left knee, subsequent encounter: Secondary | ICD-10-CM

## 2018-12-24 DIAGNOSIS — M6740 Ganglion, unspecified site: Secondary | ICD-10-CM | POA: Diagnosis present

## 2018-12-24 DIAGNOSIS — D649 Anemia, unspecified: Secondary | ICD-10-CM | POA: Insufficient documentation

## 2018-12-24 DIAGNOSIS — M67462 Ganglion, left knee: Secondary | ICD-10-CM | POA: Insufficient documentation

## 2018-12-24 DIAGNOSIS — M23062 Cystic meniscus, other lateral meniscus, left knee: Secondary | ICD-10-CM

## 2018-12-24 DIAGNOSIS — Z79899 Other long term (current) drug therapy: Secondary | ICD-10-CM | POA: Insufficient documentation

## 2018-12-24 DIAGNOSIS — S83282A Other tear of lateral meniscus, current injury, left knee, initial encounter: Secondary | ICD-10-CM

## 2018-12-24 DIAGNOSIS — Z791 Long term (current) use of non-steroidal anti-inflammatories (NSAID): Secondary | ICD-10-CM | POA: Insufficient documentation

## 2018-12-24 DIAGNOSIS — Z87442 Personal history of urinary calculi: Secondary | ICD-10-CM | POA: Insufficient documentation

## 2018-12-24 DIAGNOSIS — S83252A Bucket-handle tear of lateral meniscus, current injury, left knee, initial encounter: Secondary | ICD-10-CM | POA: Insufficient documentation

## 2018-12-24 DIAGNOSIS — M1712 Unilateral primary osteoarthritis, left knee: Secondary | ICD-10-CM | POA: Insufficient documentation

## 2018-12-24 DIAGNOSIS — X58XXXA Exposure to other specified factors, initial encounter: Secondary | ICD-10-CM | POA: Insufficient documentation

## 2018-12-24 DIAGNOSIS — M94262 Chondromalacia, left knee: Secondary | ICD-10-CM | POA: Insufficient documentation

## 2018-12-24 DIAGNOSIS — E892 Postprocedural hypoparathyroidism: Secondary | ICD-10-CM | POA: Insufficient documentation

## 2018-12-24 DIAGNOSIS — F17210 Nicotine dependence, cigarettes, uncomplicated: Secondary | ICD-10-CM | POA: Insufficient documentation

## 2018-12-24 HISTORY — PX: KNEE ARTHROSCOPY WITH LATERAL MENISECTOMY: SHX6193

## 2018-12-24 LAB — PREGNANCY, URINE: Preg Test, Ur: NEGATIVE

## 2018-12-24 SURGERY — ARTHROSCOPY, KNEE, WITH LATERAL MENISCECTOMY
Anesthesia: Regional | Site: Knee | Laterality: Left

## 2018-12-24 MED ORDER — OXYCODONE HCL 5 MG/5ML PO SOLN: 5.0000 mg | Freq: Once | ORAL | Status: DC | PRN

## 2018-12-24 MED ORDER — LIDOCAINE-EPINEPHRINE 1 %-1:100000 IJ SOLN
INTRAMUSCULAR | Status: AC
  Filled 2018-12-24: qty 1

## 2018-12-24 MED ORDER — CEFAZOLIN SODIUM-DEXTROSE 2-4 GM/100ML-% IV SOLN
2.0000 g | INTRAVENOUS | Status: AC
  Administered 2018-12-24: 2 g via INTRAVENOUS
  Filled 2018-12-24: qty 100

## 2018-12-24 MED ORDER — MIDAZOLAM HCL 2 MG/2ML IJ SOLN
1.0000 mg | INTRAMUSCULAR | Status: DC
  Administered 2018-12-24: 10:00:00 2 mg via INTRAVENOUS

## 2018-12-24 MED ORDER — LIDOCAINE-EPINEPHRINE (PF) 1 %-1:200000 IJ SOLN
INTRAMUSCULAR | Status: DC | PRN
  Administered 2018-12-24: 10 mL

## 2018-12-24 MED ORDER — DEXAMETHASONE SODIUM PHOSPHATE 10 MG/ML IJ SOLN
INTRAMUSCULAR | Status: DC | PRN
  Administered 2018-12-24: 10 mg
  Administered 2018-12-24: 4 mg via INTRAVENOUS

## 2018-12-24 MED ORDER — ONDANSETRON HCL 4 MG/2ML IJ SOLN
INTRAMUSCULAR | Status: DC | PRN
  Administered 2018-12-24: 4 mg via INTRAVENOUS

## 2018-12-24 MED ORDER — FENTANYL CITRATE (PF) 100 MCG/2ML IJ SOLN
50.0000 ug | INTRAMUSCULAR | Status: DC
  Administered 2018-12-24: 10:00:00 100 ug via INTRAVENOUS

## 2018-12-24 MED ORDER — LIDOCAINE HCL 1 % IJ SOLN: INTRAMUSCULAR | Status: DC | PRN

## 2018-12-24 MED ORDER — PROPOFOL 500 MG/50ML IV EMUL
INTRAVENOUS | Status: DC | PRN
  Administered 2018-12-24: 100 ug/kg/min via INTRAVENOUS

## 2018-12-24 MED ORDER — MIDAZOLAM HCL 2 MG/2ML IJ SOLN
INTRAMUSCULAR | Status: AC
  Filled 2018-12-24: qty 2

## 2018-12-24 MED ORDER — PROMETHAZINE HCL 25 MG/ML IJ SOLN: 6.2500 mg | INTRAMUSCULAR | Status: DC | PRN

## 2018-12-24 MED ORDER — BUPIVACAINE-EPINEPHRINE 0.25% -1:200000 IJ SOLN
INTRAMUSCULAR | Status: DC | PRN
  Administered 2018-12-24: 15 mL

## 2018-12-24 MED ORDER — LIDOCAINE HCL (CARDIAC) PF 100 MG/5ML IV SOSY
PREFILLED_SYRINGE | INTRAVENOUS | Status: DC | PRN
  Administered 2018-12-24: 40 mg via INTRAVENOUS

## 2018-12-24 MED ORDER — MEPERIDINE HCL 50 MG/ML IJ SOLN: 6.2500 mg | INTRAMUSCULAR | Status: DC | PRN

## 2018-12-24 MED ORDER — ACETAMINOPHEN 500 MG PO TABS
1000.0000 mg | ORAL_TABLET | Freq: Once | ORAL | Status: AC
  Administered 2018-12-24: 09:00:00 1000 mg via ORAL
  Filled 2018-12-24: qty 2

## 2018-12-24 MED ORDER — PROPOFOL 10 MG/ML IV BOLUS
INTRAVENOUS | Status: DC | PRN
  Administered 2018-12-24: 20 mg via INTRAVENOUS

## 2018-12-24 MED ORDER — OXYCODONE HCL 5 MG PO TABS: 5.0000 mg | ORAL_TABLET | ORAL | 0 refills | Status: DC | PRN

## 2018-12-24 MED ORDER — MIDAZOLAM HCL 2 MG/2ML IJ SOLN
INTRAMUSCULAR | Status: AC
  Administered 2018-12-24: 10:00:00 2 mg via INTRAVENOUS
  Filled 2018-12-24: qty 2

## 2018-12-24 MED ORDER — PROPOFOL 10 MG/ML IV BOLUS
INTRAVENOUS | Status: AC
  Filled 2018-12-24: qty 20

## 2018-12-24 MED ORDER — MIDAZOLAM HCL 5 MG/5ML IJ SOLN
INTRAMUSCULAR | Status: DC | PRN
  Administered 2018-12-24: 2 mg via INTRAVENOUS

## 2018-12-24 MED ORDER — SODIUM CHLORIDE 0.9 % IV SOLN: INTRAVENOUS | Status: DC

## 2018-12-24 MED ORDER — BUPIVACAINE HCL (PF) 0.25 % IJ SOLN
INTRAMUSCULAR | Status: AC
  Filled 2018-12-24: qty 30

## 2018-12-24 MED ORDER — LACTATED RINGERS IV SOLN
INTRAVENOUS | Status: DC
  Administered 2018-12-24: 09:00:00 via INTRAVENOUS

## 2018-12-24 MED ORDER — CHLORHEXIDINE GLUCONATE 4 % EX LIQD: 60.0000 mL | Freq: Once | CUTANEOUS | Status: DC

## 2018-12-24 MED ORDER — POVIDONE-IODINE 10 % EX SWAB
2.0000 "application " | Freq: Once | CUTANEOUS | Status: AC
  Administered 2018-12-24: 2 via TOPICAL

## 2018-12-24 MED ORDER — HYDROMORPHONE HCL 1 MG/ML IJ SOLN: 0.2500 mg | INTRAMUSCULAR | Status: DC | PRN

## 2018-12-24 MED ORDER — BUPIVACAINE IN DEXTROSE 0.75-8.25 % IT SOLN
INTRATHECAL | Status: DC | PRN
  Administered 2018-12-24: 13 mg via INTRATHECAL

## 2018-12-24 MED ORDER — SODIUM CHLORIDE 0.9 % IR SOLN
Status: DC | PRN
  Administered 2018-12-24: 6000 mL

## 2018-12-24 MED ORDER — FENTANYL CITRATE (PF) 100 MCG/2ML IJ SOLN
INTRAMUSCULAR | Status: AC
  Filled 2018-12-24: qty 2

## 2018-12-24 MED ORDER — OXYCODONE HCL 5 MG PO TABS: 5.0000 mg | ORAL_TABLET | Freq: Once | ORAL | Status: DC | PRN

## 2018-12-24 MED ORDER — ONDANSETRON HCL 4 MG/2ML IJ SOLN
INTRAMUSCULAR | Status: AC
  Filled 2018-12-24: qty 2

## 2018-12-24 MED ORDER — ROPIVACAINE HCL 5 MG/ML IJ SOLN
INTRAMUSCULAR | Status: DC | PRN
  Administered 2018-12-24: 20 mL via PERINEURAL

## 2018-12-24 MED ORDER — DEXAMETHASONE SODIUM PHOSPHATE 10 MG/ML IJ SOLN
INTRAMUSCULAR | Status: AC
  Filled 2018-12-24: qty 1

## 2018-12-24 MED ORDER — EPHEDRINE SULFATE 50 MG/ML IJ SOLN
INTRAMUSCULAR | Status: DC | PRN
  Administered 2018-12-24: 5 mg via INTRAVENOUS

## 2018-12-24 MED ORDER — KETOROLAC TROMETHAMINE 30 MG/ML IJ SOLN: 30.0000 mg | Freq: Once | INTRAMUSCULAR | Status: DC | PRN

## 2018-12-24 MED ORDER — FENTANYL CITRATE (PF) 100 MCG/2ML IJ SOLN
INTRAMUSCULAR | Status: AC
  Administered 2018-12-24: 100 ug via INTRAVENOUS
  Filled 2018-12-24: qty 2

## 2018-12-24 SURGICAL SUPPLY — 31 items
BLADE CUDA SHAVER 3.5 (BLADE) ×3 IMPLANT
BLADE EXCALIBUR 4.0MM X 13CM (MISCELLANEOUS)
BLADE EXCALIBUR 4.0X13 (MISCELLANEOUS) IMPLANT
BNDG ELASTIC 6X5.8 VLCR STR LF (GAUZE/BANDAGES/DRESSINGS) ×3 IMPLANT
BNDG GAUZE ELAST 4 BULKY (GAUZE/BANDAGES/DRESSINGS) ×3 IMPLANT
COVER WAND RF STERILE (DRAPES) ×2 IMPLANT
DISSECTOR  3.8MM X 13CM (MISCELLANEOUS)
DISSECTOR 3.8MM X 13CM (MISCELLANEOUS) IMPLANT
DRAPE ARTHROSCOPY W/POUCH 114 (DRAPES) ×3 IMPLANT
DRSG EMULSION OIL 3X3 NADH (GAUZE/BANDAGES/DRESSINGS) ×3 IMPLANT
DURAPREP 26ML APPLICATOR (WOUND CARE) ×3 IMPLANT
ELECT MENISCUS 165MM 90D (ELECTRODE) IMPLANT
ELECT PENCIL ROCKER SW 15FT (MISCELLANEOUS) IMPLANT
ELECT REM PT RETURN 15FT ADLT (MISCELLANEOUS) IMPLANT
GAUZE SPONGE 4X4 12PLY STRL (GAUZE/BANDAGES/DRESSINGS) ×2 IMPLANT
GLOVE BIOGEL PI IND STRL 8.5 (GLOVE) IMPLANT
GLOVE BIOGEL PI INDICATOR 8.5 (GLOVE)
GLOVE ECLIPSE 8.0 STRL XLNG CF (GLOVE) ×3 IMPLANT
GLOVE SURG ORTHO 8.0 STRL STRW (GLOVE) IMPLANT
GOWN STRL REUS W/ TWL LRG LVL3 (GOWN DISPOSABLE) ×1 IMPLANT
GOWN STRL REUS W/TWL 2XL LVL3 (GOWN DISPOSABLE) ×3 IMPLANT
GOWN STRL REUS W/TWL LRG LVL3 (GOWN DISPOSABLE) ×3
MANIFOLD NEPTUNE II (INSTRUMENTS) ×2 IMPLANT
PACK ARTHROSCOPY DSU (CUSTOM PROCEDURE TRAY) ×3 IMPLANT
PAD MASON LEG HOLDER (PIN) ×3 IMPLANT
PORT APPOLLO RF 90DEGREE MULTI (SURGICAL WAND) ×3 IMPLANT
SUT ETHILON 4 0 PS 2 18 (SUTURE) ×2 IMPLANT
TOWEL OR 17X26 10 PK STRL BLUE (TOWEL DISPOSABLE) ×3 IMPLANT
TUBING ARTHROSCOPY IRRIG 16FT (MISCELLANEOUS) ×3 IMPLANT
WATER STERILE IRR 1000ML POUR (IV SOLUTION) ×1 IMPLANT
WRAP KNEE MAXI GEL POST OP (GAUZE/BANDAGES/DRESSINGS) ×1001 IMPLANT

## 2018-12-24 NOTE — Anesthesia Preprocedure Evaluation (Addendum)
Anesthesia Evaluation  Patient identified by MRN, date of birth, ID band Patient awake    Reviewed: Allergy & Precautions, H&P , NPO status , Patient's Chart, lab work & pertinent test results  Airway Mallampati: I       Dental  (+) Chipped   Pulmonary neg COPD, Current Smoker and Patient abstained from smoking.,  3.5 pack year history   Pulmonary exam normal        Cardiovascular negative cardio ROS Normal cardiovascular exam     Neuro/Psych negative neurological ROS  negative psych ROS   GI/Hepatic GERD  ,(+)     substance abuse  marijuana use,   Endo/Other  Hyperparathyroidism s/p parathyroidectomy  Renal/GU Hx nephrolithiasis   Uterine fibroids    Musculoskeletal Torn left lateral meniscus    Abdominal Normal abdominal exam  (+)   Peds  Hematology  (+) Blood dyscrasia, anemia , hct 40.1, plt 375   Anesthesia Other Findings        Reproductive/Obstetrics negative OB ROS                           Anesthesia Physical  Anesthesia Plan  ASA: II  Anesthesia Plan: Spinal and Regional   Post-op Pain Management:  Regional for Post-op pain   Induction: Intravenous  PONV Risk Score and Plan: 2 and Treatment may vary due to age or medical condition, Midazolam, TIVA and Propofol infusion  Airway Management Planned: Natural Airway and Nasal Cannula  Additional Equipment: None  Intra-op Plan:   Post-operative Plan:   Informed Consent: I have reviewed the patients History and Physical, chart, labs and discussed the procedure including the risks, benefits and alternatives for the proposed anesthesia with the patient or authorized representative who has indicated his/her understanding and acceptance.       Plan Discussed with: CRNA  Anesthesia Plan Comments:        Anesthesia Quick Evaluation

## 2018-12-24 NOTE — Anesthesia Postprocedure Evaluation (Signed)
Anesthesia Post Note  Patient: Katie Benson  Procedure(s) Performed: LEFT KNEE ARTHROSCOPY WITH LATERAL MENISECTOM, debriment of GANGLION CYST (Left Knee)     Patient location during evaluation: PACU Anesthesia Type: Regional, Spinal and MAC Level of consciousness: oriented and awake and alert Pain management: pain level controlled Vital Signs Assessment: post-procedure vital signs reviewed and stable Respiratory status: spontaneous breathing, respiratory function stable and patient connected to nasal cannula oxygen Cardiovascular status: blood pressure returned to baseline and stable Postop Assessment: no headache, no backache, no apparent nausea or vomiting and spinal receding Anesthetic complications: no    Last Vitals:  Vitals:   12/24/18 1215 12/24/18 1229  BP: 122/77   Pulse: (!) 56 (!) 50  Resp: 13 14  Temp:  36.7 C  SpO2: 100% 100%    Last Pain:  Vitals:   12/24/18 1229  TempSrc:   PainSc: 0-No pain                 Pervis Hocking
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
## 2018-12-24 NOTE — Op Note (Signed)
PATIENT ID:      Katie Benson  MRN:     [id number] DOB/AGE:    05-16-1980 / 38 y.o.       OPERATIVE REPORT    DATE OF PROCEDURE:  12/24/2018       PREOPERATIVE DIAGNOSIS:   Bucket handle tear lateral meniscus left knee, cyst left knee anterior to anterior cruciate ligament                                                       Estimated body mass index is 28.24 kg/m as calculated from the following:   Height as of this encounter: 5\' 3"  (1.6 m).   Weight as of this encounter: 72.3 kg.     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   Weight as of this encounter: 72.3 kg.     PROCEDURE:  Procedure(s): LEFT KNEE ARTHROSCOPY WITH PARTIAL LATERAL MENISECTOMY, debriDEment of GANGLION CYST      SURGEON:  Joni Fears, MD    ASSISTANT:   Biagio Borg, PA-C   (Present and scrubbed throughout the case, critical for assistance with exposure, retraction, instrumentation, and closure.)          ANESTHESIA: local, spinal and IV sedation     DRAINS: none :      TOURNIQUET TIME: none   COMPLICATIONS:  None   CONDITION:  stable  PROCEDURE IN DETAIL: [id number]   [name] 12/24/2018, 11:43 AM

## 2018-12-24 NOTE — Anesthesia Procedure Notes (Signed)
Spinal  Patient location during procedure: OR Start time: 12/24/2018 10:42 AM End time: 12/24/2018 10:47 AM Staffing Resident/CRNA: Garrel Ridgel, CRNA Performed: resident/CRNA  Preanesthetic Checklist Completed: patient identified, site marked, surgical consent, pre-op evaluation, timeout performed, IV checked, risks and benefits discussed and monitors and equipment checked Spinal Block Patient position: sitting Prep: DuraPrep Patient monitoring: heart rate, cardiac monitor, continuous pulse ox and blood pressure Approach: midline Location: L3-4 Injection technique: single-shot Needle Needle type: Sprotte and Pencan  Needle gauge: 24 G Needle length: 9 cm Needle insertion depth: 4 cm Assessment Sensory level: T4

## 2018-12-24 NOTE — Anesthesia Procedure Notes (Signed)
Anesthesia Regional Block: Adductor canal block   Pre-Anesthetic Checklist: ,, timeout performed, Correct Patient, Correct Site, Correct Laterality, Correct Procedure, Correct Position, site marked, Risks and benefits discussed,  Surgical consent,  Pre-op evaluation,  At surgeon's request and post-op pain management  Laterality: Left  Prep: Maximum Sterile Barrier Precautions used, chloraprep       Needles:  Injection technique: Single-shot  Needle Type: Echogenic Stimulator Needle     Needle Length: 9cm  Needle Gauge: 22     Additional Needles:   Procedures:,,,, ultrasound used (permanent image in chart),,,,  Narrative:  Start time: 12/24/2018 9:50 AM End time: 12/24/2018 9:55 AM Injection made incrementally with aspirations every 5 mL.  Performed by: Personally  Anesthesiologist: Pervis Hocking, DO  Additional Notes: Monitors applied. No increased pain on injection. No increased resistance to injection. Injection made in 5cc increments. Good needle visualization. Patient tolerated procedure well.

## 2018-12-24 NOTE — Transfer of Care (Signed)
Immediate Anesthesia Transfer of Care Note  Patient: Katie Benson  Procedure(s) Performed: LEFT KNEE ARTHROSCOPY WITH LATERAL MENISECTOM, debriment of GANGLION CYST (Left Knee)  Patient Location: PACU  Anesthesia Type:Spinal  Level of Consciousness: awake, alert , oriented and patient cooperative  Airway & Oxygen Therapy: Patient Spontanous Breathing and Patient connected to face mask oxygen  Post-op Assessment: Report given to RN and Post -op Vital signs reviewed and stable  Post vital signs: Reviewed and stable  Last Vitals:  Vitals Value Taken Time  BP 106/76 12/24/18 1157  Temp    Pulse 54 12/24/18 1159  Resp 12 12/24/18 1159  SpO2 100 % 12/24/18 1159  Vitals shown include unvalidated device data.  Last Pain:  Vitals:   12/24/18 0848  TempSrc:   PainSc: 7       Patients Stated Pain Goal: 4 ([id number])  Complications: No apparent anesthesia complications

## 2018-12-24 NOTE — Progress Notes (Signed)
Assisted Dr. Beth Finucane with left, ultrasound guided, adductor canal block. Side rails up, monitors on throughout procedure. See vital signs in flow sheet. Tolerated Procedure well. ° °

## 2018-12-24 NOTE — H&P (Signed)
The recent History & Physical has been reviewed. I have personally examined the patient today. There is no interval change to the documented History & Physical. The patient would like to proceed with the procedure.  [name] 12/24/2018,  10:03 AM

## 2018-12-25 ENCOUNTER — Encounter (HOSPITAL_COMMUNITY): Payer: Self-pay | Admitting: Orthopaedic Surgery

## 2018-12-25 ENCOUNTER — Telehealth: Payer: Self-pay | Admitting: Orthopaedic Surgery

## 2018-12-25 NOTE — Telephone Encounter (Signed)
Called and spoke with patient. She is doing much better today. She said that she did not go back to the ED yesterday after going to [facility]. Her pelvic pain is gone. She has full sensation back as to when she needs to urinate. She has rewrapped her leg with a bandage because it fell down to her ankle. I ask her to call us if she has any questions or concerns that may come up.

## 2018-12-25 NOTE — Telephone Encounter (Signed)
Thanks.  Never got a call last night

## 2018-12-31 ENCOUNTER — Telehealth: Payer: Self-pay | Admitting: Orthopaedic Surgery

## 2018-12-31 ENCOUNTER — Encounter: Payer: Self-pay | Admitting: Orthopedic Surgery

## 2018-12-31 ENCOUNTER — Ambulatory Visit (INDEPENDENT_AMBULATORY_CARE_PROVIDER_SITE_OTHER): Payer: No Typology Code available for payment source | Admitting: Orthopedic Surgery

## 2018-12-31 ENCOUNTER — Other Ambulatory Visit: Payer: Self-pay

## 2018-12-31 VITALS — BP 120/72 | HR 85 | Ht 63.0 in | Wt 159.0 lb

## 2018-12-31 DIAGNOSIS — S83252D Bucket-handle tear of lateral meniscus, current injury, left knee, subsequent encounter: Secondary | ICD-10-CM

## 2018-12-31 MED ORDER — HYDROCODONE-ACETAMINOPHEN 5-325 MG PO TABS: 1.0000 | ORAL_TABLET | ORAL | 0 refills | Status: DC | PRN

## 2018-12-31 NOTE — Telephone Encounter (Signed)
Did yall discuss a work note today by chance?

## 2018-12-31 NOTE — Telephone Encounter (Signed)
Yes, Please do out of work note till next visit

## 2018-12-31 NOTE — Progress Notes (Signed)
Office Visit Note   Patient: Katie Benson           Date of Birth: 18-Feb-1981           MRN: [id number] Visit Date: 12/31/2018              Requested by: Charlott Rakes, MD [place],  [place] [id number] PCP: Charlott Rakes, MD   Assessment & Plan: Visit Diagnoses:  1. Bucket handle tear of lateral meniscus of left knee, unspecified whether old or current tear, subsequent encounter     Plan:  #1: Continue partial weightbearing with her crutches. #2: Begin gentle range of motion and straight leg raising exercises. #3: Given her prescription for hydrocodone and she can take that instead of the oxycodone for pain. #4: Discussed that she starts developing any calf pain which is worsening she will give [place] a call and we can do a Doppler     Follow-Up Instructions: Return in about 1 week (around 01/07/2019).   Orders:  No orders of the defined types were placed in this encounter.  Meds ordered this encounter  Medications  . HYDROcodone-acetaminophen (NORCO/VICODIN) 5-325 MG tablet    Sig: Take 1-2 tablets by mouth every 4 (four) hours as needed for moderate pain or severe pain.    Dispense:  30 tablet    Refill:  0    Order Specific Question:   Supervising Provider    Answer:   [name] [id number]      Procedures: No procedures performed   Clinical Data: No additional findings.   Subjective: Chief Complaint  Patient presents with  . Left Knee - Routine Post Op    Left knee arthroscopy 12/24/2018  HPI  Patient presents today for follow up on her left knee. She had a left knee arthroscopy with lateral menisectomy and debridement of ganglion cyst on 12/24/2018. Patient states that she is taking Aleve for pain. She is unable to take the oxycodone because it makes her feel sick during the day. She is getting around with the assistance of crutches.     Review of Systems  Constitutional: Negative for fatigue.  HENT: Negative for ear  pain.   Eyes: Negative for pain.  Respiratory: Negative for shortness of breath.   Cardiovascular: Negative for leg swelling.  Gastrointestinal: Positive for constipation. Negative for diarrhea.  Endocrine: Negative for cold intolerance and heat intolerance.  Genitourinary: Negative for difficulty urinating.  Musculoskeletal: Positive for joint swelling.  Skin: Negative for rash.  Allergic/Immunologic: Positive for food allergies.  Neurological: Negative for weakness.  Hematological: Does not bruise/bleed easily.  Psychiatric/Behavioral: Positive for sleep disturbance.     Objective: Vital Signs: BP 120/72   Pulse 85   Ht 5\' 3"  (1.6 m)   Wt 159 lb (72.1 kg)   LMP  (LMP Unknown) Comment: fibroid surgery in January 21   BMI 28.17 kg/m   Physical Exam Constitutional:      Appearance: She is well-developed.  Eyes:     Pupils: Pupils are equal, round, and reactive to light.  Pulmonary:     Effort: Pulmonary effort is normal.  Skin:    General: Skin is warm and dry.  Neurological:     Mental Status: She is alert and oriented to person, place, and time.  Psychiatric:        Behavior: Behavior normal.     Ortho Exam  Examination today reveals the wounds to be healing per primam with no signs  of infection.  She does have a mild to moderate effusion but is soft and not tense.  Little bit of warmth about the knee.  She can range from 0 to about 45 to 50degrees.  She states the swelling is improved.  Specialty Comments:  No specialty comments available.  Imaging: No results found.   PMFS History: Current Outpatient Medications  Medication Sig Dispense Refill  . hydrOXYzine (ATARAX/VISTARIL) 50 MG tablet Take 1-2 tablets (50-100 mg total) by mouth every 8 (eight) hours as needed for itching. 60 tablet 2  . megestrol (MEGACE) 40 MG tablet TAKE 1 TABLET(40 MG) BY MOUTH TWICE DAILY 60 tablet 4  . meloxicam (MOBIC) 7.5 MG tablet Take 1 tablet (7.5 mg total) by mouth daily. 30  tablet 1  . Menthol-Methyl Salicylate (MUSCLE RUB) 10-15 % CREA Apply 1 application topically as needed for muscle pain.    . naproxen sodium (ALEVE) 220 MG tablet Take 220 mg by mouth 2 (two) times daily as needed (pain).     Marland Kitchen oxyCODONE (ROXICODONE) 5 MG immediate release tablet Take 1-2 tablets (5-10 mg total) by mouth every 4 (four) hours as needed. 30 tablet 0  . pantoprazole (PROTONIX) 40 MG tablet Take 1 tablet (40 mg total) by mouth 2 (two) times daily. 60 tablet 11  . traZODone (DESYREL) 50 MG tablet Take 1 tablet (50 mg total) by mouth at bedtime as needed for sleep. 30 tablet 1  . HYDROcodone-acetaminophen (NORCO/VICODIN) 5-325 MG tablet Take 1-2 tablets by mouth every 4 (four) hours as needed for moderate pain or severe pain. 30 tablet 0   No current facility-administered medications for this visit.     Patient Active Problem List   Diagnosis Date Noted  . Bucket handle tear of lateral meniscus of left knee 12/12/2018  . Ganglion cyst 12/12/2018  . Pain in left knee 11/15/2018  . Fibroids 03/18/2018  . S/P parathyroidectomy 02/12/2018  . Primary hyperparathyroidism ([place]) 01/08/2018  . Hyperparathyroidism , secondary, non-renal ([place]) 01/08/2018  . Fibroid uterus 12/06/2017  . Hypercalcemia 12/06/2017  . Fibroid 10/15/2017  . Abnormal uterine bleeding (AUB) 08/20/2017  . Anemia 08/20/2017   Past Medical History:  Diagnosis Date  . Anemia   . Fibroids   . GERD (gastroesophageal reflux disease)   . History of kidney stones   . Neck injury     Family History  Problem Relation Age of Onset  . Diabetes Sister   . Cancer Maternal Grandmother        ovarian  . Hypercalcemia Mother     Past Surgical History:  Procedure Laterality Date  . CESAREAN SECTION    . IR ANGIOGRAM PELVIS SELECTIVE OR SUPRASELECTIVE  03/18/2018  . IR ANGIOGRAM PELVIS SELECTIVE OR SUPRASELECTIVE  03/18/2018  . IR ANGIOGRAM SELECTIVE EACH ADDITIONAL VESSEL  03/18/2018  . IR ANGIOGRAM SELECTIVE EACH  ADDITIONAL VESSEL  03/18/2018  . IR EMBO TUMOR ORGAN ISCHEMIA INFARCT INC GUIDE ROADMAPPING  03/18/2018  . IR RADIOLOGIST EVAL & MGMT  10/17/2017  . IR RADIOLOGIST EVAL & MGMT  12/20/2017  . IR RADIOLOGIST EVAL & MGMT  04/17/2018  . IR US GUIDE VASC ACCESS RIGHT  03/18/2018  . KNEE ARTHROSCOPY WITH LATERAL MENISECTOMY Left 12/24/2018   Procedure: LEFT KNEE ARTHROSCOPY WITH LATERAL MENISECTOM, debriment of GANGLION CYST;  Surgeon: [name], MD;  Location: [facility];  Service: Orthopedics;  Laterality: Left;  . PARATHYROIDECTOMY N/A 02/12/2018   Procedure: PARATHYROIDECTOMY;  Surgeon: Fredirick Maudlin, MD;  Location: [facility] ORS;  Service: General;  Laterality: N/A;  . WISDOM TOOTH EXTRACTION     age 38   Social History   Occupational History  . Not on file  Tobacco Use  . Smoking status: Current Every Day Smoker    Packs/day: 0.25    Years: 14.00    Pack years: 3.50    Types: Cigarettes  . Smokeless tobacco: Never Used  Substance and Sexual Activity  . Alcohol use: Yes    Comment: occassionally on weekends  . Drug use: Yes    Types: Marijuana    Comment: last used 12/14/2018-marijuana  . Sexual activity: Yes    Birth control/protection: Condom, None

## 2018-12-31 NOTE — Telephone Encounter (Signed)
Spoke with patient and let her know that work note has been completed.

## 2018-12-31 NOTE — Telephone Encounter (Signed)
Patient called. She would like a note for work. She said you can send it through [facility]. Her call back number is [phone]

## 2019-01-08 ENCOUNTER — Other Ambulatory Visit: Payer: Self-pay

## 2019-01-08 ENCOUNTER — Ambulatory Visit (INDEPENDENT_AMBULATORY_CARE_PROVIDER_SITE_OTHER): Payer: Self-pay | Admitting: Orthopaedic Surgery

## 2019-01-08 ENCOUNTER — Telehealth: Payer: Self-pay | Admitting: Orthopaedic Surgery

## 2019-01-08 ENCOUNTER — Encounter: Payer: Self-pay | Admitting: Orthopaedic Surgery

## 2019-01-08 VITALS — BP 122/76 | HR 100 | Wt 159.0 lb

## 2019-01-08 DIAGNOSIS — G8929 Other chronic pain: Secondary | ICD-10-CM

## 2019-01-08 DIAGNOSIS — M25562 Pain in left knee: Secondary | ICD-10-CM

## 2019-01-08 MED ORDER — HYDROCODONE-ACETAMINOPHEN 5-325 MG PO TABS: 1.0000 | ORAL_TABLET | Freq: Four times a day (QID) | ORAL | 0 refills | Status: DC | PRN

## 2019-01-08 NOTE — Telephone Encounter (Signed)
Patient needs an appointment for 2 weeks. There are no openings. Please call her with an appointment. Thanks

## 2019-01-08 NOTE — Progress Notes (Signed)
Office Visit Note   Patient: Katie Benson           Date of Birth: 02/10/1981           MRN: [id number] Visit Date: 01/08/2019              Requested by: Charlott Rakes, MD [place],  [place] [address] PCP: Charlott Rakes, MD   Assessment & Plan: Visit Diagnoses:  1. Chronic pain of left knee     Plan: 2 weeks status post left knee arthroscopy with partial lateral meniscectomy.  There was a displaced bucket-handle tear of the lateral meniscus that was frayed and in the white white zone.  I did not think it was repairable.  There was also a cystic area anterior to the anterior cruciate ligament which was also debrided.  The ACL otherwise appeared to be intact.  No pathology in the lateral compartment.  Her postoperative course has been slow she is still on crutches still having some pain.  I like to start some physical therapy.  I tried to aspirate her knee today and only retrieved about 3 to 4 cc of of old blood using an 18-gauge needle.  Office 2 weeks weightbearing as tolerated  Follow-Up Instructions: Return in about 2 weeks (around 01/22/2019).   Orders:  No orders of the defined types were placed in this encounter.  No orders of the defined types were placed in this encounter.     Procedures: No procedures performed   Clinical Data: No additional findings.   Subjective: Chief Complaint  Patient presents with  . Left Knee - Follow-up    12/24/2018 Left Knee Arthroscopy  Patient returns post left knee arthroscopy with lateral meniscectomy, debridement of ganglion cyst on 12/24/2018.  She ambulates with crutches. She is taking hydrocodone 1 every 2-3 hours during the day with relief, but it does not help an night. She is having to take Oxycodone at night. The pain wakes her at night. Pain is more medial knee. She also complains of pain down her shin. She does describe decreased flexibility of her knee.  HPI  Review of Systems   Objective:  Vital Signs: BP 122/76   Pulse 100   Wt 159 lb (72.1 kg)   LMP  (LMP Unknown) Comment: fibroid surgery in January 21   BMI 28.17 kg/m   Physical Exam  Ortho Exam left knee was little bit warmer than the right but certainly not hot.  Arthroscopic portals of healed without any problem.  No calf pain.  Appears that there is an effusion or hemarthrosis.  As mentioned above I tried to aspirate only retrieve several cc of blood.  She seems to have pain out of proportion what I would expect to see her just for me to touch in multiple locations.  No swelling distally.  Neurologically intact  Specialty Comments:  No specialty comments available.  Imaging: No results found.   PMFS History: Patient Active Problem List   Diagnosis Date Noted  . Bucket handle tear of lateral meniscus of left knee 12/12/2018  . Ganglion cyst 12/12/2018  . Pain in left knee 11/15/2018  . Fibroids 03/18/2018  . S/P parathyroidectomy 02/12/2018  . Primary hyperparathyroidism ([place]) 01/08/2018  . Hyperparathyroidism , secondary, non-renal ([place]) 01/08/2018  . Fibroid uterus 12/06/2017  . Hypercalcemia 12/06/2017  . Fibroid 10/15/2017  . Abnormal uterine bleeding (AUB) 08/20/2017  . Anemia 08/20/2017   Past Medical History:  Diagnosis Date  . Anemia   .  Fibroids   . GERD (gastroesophageal reflux disease)   . History of kidney stones   . Neck injury     Family History  Problem Relation Age of Onset  . Diabetes Sister   . Cancer Maternal Grandmother        ovarian  . Hypercalcemia Mother     Past Surgical History:  Procedure Laterality Date  . CESAREAN SECTION    . IR ANGIOGRAM PELVIS SELECTIVE OR SUPRASELECTIVE  03/18/2018  . IR ANGIOGRAM PELVIS SELECTIVE OR SUPRASELECTIVE  03/18/2018  . IR ANGIOGRAM SELECTIVE EACH ADDITIONAL VESSEL  03/18/2018  . IR ANGIOGRAM SELECTIVE EACH ADDITIONAL VESSEL  03/18/2018  . IR EMBO TUMOR ORGAN ISCHEMIA INFARCT INC GUIDE ROADMAPPING  03/18/2018  . IR RADIOLOGIST EVAL &  MGMT  10/17/2017  . IR RADIOLOGIST EVAL & MGMT  12/20/2017  . IR RADIOLOGIST EVAL & MGMT  04/17/2018  . IR US GUIDE VASC ACCESS RIGHT  03/18/2018  . KNEE ARTHROSCOPY WITH LATERAL MENISECTOMY Left 12/24/2018   Procedure: LEFT KNEE ARTHROSCOPY WITH LATERAL MENISECTOM, debriment of GANGLION CYST;  Surgeon: [name], MD;  Location: [facility];  Service: Orthopedics;  Laterality: Left;  . PARATHYROIDECTOMY N/A 02/12/2018   Procedure: PARATHYROIDECTOMY;  Surgeon: Fredirick Maudlin, MD;  Location: [facility] ORS;  Service: General;  Laterality: N/A;  . WISDOM TOOTH EXTRACTION     age 38   Social History   Occupational History  . Not on file  Tobacco Use  . Smoking status: Current Every Day Smoker    Packs/day: 0.25    Years: 14.00    Pack years: 3.50    Types: Cigarettes  . Smokeless tobacco: Never Used  Substance and Sexual Activity  . Alcohol use: Yes    Comment: occassionally on weekends  . Drug use: Yes    Types: Marijuana    Comment: last used 12/14/2018-marijuana  . Sexual activity: Yes    Birth control/protection: Condom, None

## 2019-01-08 NOTE — Addendum Note (Signed)
Addended by: Meyer Cory on: 01/08/2019 02:29 PM   Modules accepted: Orders

## 2019-01-08 NOTE — Addendum Note (Signed)
Addended by: [name] on: 01/08/2019 02:30 PM   Modules accepted: Orders

## 2019-01-08 NOTE — Telephone Encounter (Signed)
Holding for you to work patient in where needed.

## 2019-01-09 NOTE — Telephone Encounter (Signed)
Please call and schedule patient for two weeks. Working her in to the schedule is fine. Thanks!

## 2019-01-09 NOTE — Telephone Encounter (Signed)
Forwarded information to [place] and she will work patient in for 2 wk post op

## 2019-01-16 ENCOUNTER — Other Ambulatory Visit: Payer: Self-pay

## 2019-01-16 ENCOUNTER — Ambulatory Visit (INDEPENDENT_AMBULATORY_CARE_PROVIDER_SITE_OTHER): Payer: Self-pay | Admitting: Physical Therapy

## 2019-01-16 ENCOUNTER — Encounter: Payer: Self-pay | Admitting: Physical Therapy

## 2019-01-16 DIAGNOSIS — R2689 Other abnormalities of gait and mobility: Secondary | ICD-10-CM

## 2019-01-16 DIAGNOSIS — M25562 Pain in left knee: Secondary | ICD-10-CM

## 2019-01-16 DIAGNOSIS — M25662 Stiffness of left knee, not elsewhere classified: Secondary | ICD-10-CM

## 2019-01-16 DIAGNOSIS — R600 Localized edema: Secondary | ICD-10-CM

## 2019-01-16 NOTE — Patient Instructions (Signed)
Access Code: [email or web address]  URL: [email or web address]  Date: 01/16/2019  Prepared by: Faustino Congress   Exercises  Supine Quadricep Sets - 10 reps - 1 sets - 5 sec hold - 3x daily - 7x weekly  Supine Heel Slide with Strap - 10 reps - 1 sets - 3x daily - 7x weekly  Small Range Straight Leg Raise - 10 reps - 1 sets - 3x daily - 7x weekly

## 2019-01-16 NOTE — Therapy (Signed)
[facility] [address] [place], [place], [address] Phone: [phone]   Fax:  [phone]  Physical Therapy Evaluation  Patient Details  Name: Katie Benson MRN: [id number] Date of Birth: [birth year] Referring Provider (PT): Dr. Joni Fears   Encounter Date: 01/16/2019  PT End of Session - 01/16/19 1350    Visit Number  1    Number of Visits  12    Date for PT Re-Evaluation  02/27/19    PT Start Time  [id number]    PT Stop Time  1348    PT Time Calculation (min)  34 min    Activity Tolerance  Patient tolerated treatment well    Behavior During Therapy  [facility] for tasks assessed/performed       Past Medical History:  Diagnosis Date  . Anemia   . Fibroids   . GERD (gastroesophageal reflux disease)   . History of kidney stones   . Neck injury     Past Surgical History:  Procedure Laterality Date  . CESAREAN SECTION    . IR ANGIOGRAM PELVIS SELECTIVE OR SUPRASELECTIVE  03/18/2018  . IR ANGIOGRAM PELVIS SELECTIVE OR SUPRASELECTIVE  03/18/2018  . IR ANGIOGRAM SELECTIVE EACH ADDITIONAL VESSEL  03/18/2018  . IR ANGIOGRAM SELECTIVE EACH ADDITIONAL VESSEL  03/18/2018  . IR EMBO TUMOR ORGAN ISCHEMIA INFARCT INC GUIDE ROADMAPPING  03/18/2018  . IR RADIOLOGIST EVAL & MGMT  10/17/2017  . IR RADIOLOGIST EVAL & MGMT  12/20/2017  . IR RADIOLOGIST EVAL & MGMT  04/17/2018  . IR US GUIDE VASC ACCESS RIGHT  03/18/2018  . KNEE ARTHROSCOPY WITH LATERAL MENISECTOMY Left 12/24/2018   Procedure: LEFT KNEE ARTHROSCOPY WITH LATERAL MENISECTOM, debriment of GANGLION CYST;  Surgeon: [name], MD;  Location: [facility];  Service: Orthopedics;  Laterality: Left;  . PARATHYROIDECTOMY N/A 02/12/2018   Procedure: PARATHYROIDECTOMY;  Surgeon: Fredirick Maudlin, MD;  Location: [facility] OR[facility];  Service: General;  Laterality: N/A;  . WISDOM TOOTH EXTRACTION     age 41    There were no vitals filed for this visit.   Subjective Assessment - 01/16/19 1320     Subjective  Pt is a 38 y/o female who presents to [facility] s/p Lt knee arthroscopy with lateral minescectomy and ganglion cyst debridement on 12/24/18.  Pt presents today with continued pain and decreased motion, and walking with crutches.    Limitations  Standing;Walking    How long can you stand comfortably?  10 min    How long can you walk comfortably?  5 min    Patient Stated Goals  improve pain, motion    Currently in Pain?  Yes    Pain Score  10-Worst pain ever   pt in no acute distress; at best 5/10   Pain Location  Knee    Pain Orientation  Left    Pain Descriptors / Indicators  Spasm;Throbbing;Tender    Pain Type  Surgical pain;Acute pain    Pain Onset  1 to 4 weeks ago    Pain Frequency  Constant    Aggravating Factors   standing, walking, bending    Pain Relieving Factors  medication         [facility] PT Assessment - 01/16/19 1324      Assessment   Medical Diagnosis  S/P left knee arthroscopy with lateral meniscectomy and ganglion cyst debridement.    Referring Provider (PT)  Dr. Joni Fears    Onset Date/Surgical Date  12/24/18    Hand Dominance  Right  Next MD Visit  02/20/19    Prior Therapy  none      Precautions   Precautions  None      Restrictions   Weight Bearing Restrictions  No      Balance Screen   Has the patient fallen in the past 6 months  No    Has the patient had a decrease in activity level because of a fear of falling?   Yes    Is the patient reluctant to leave their home because of a fear of falling?   Yes      [employer]  Private residence    Living Arrangements  Children;Spouse/significant other   daughter (11 y/o); boyfriend   Type of [place] to enter    Entrance Stairs-Number of Steps  10    Entrance Stairs-Rails  Right    [place]  Two level;Able to live on main level with bedroom/bathroom    Home Equipment  Crutches      Prior Function   Level of Independence  Independent     Vocation  Unemployed   was full-time for [employer], walking dogs      Cognition   Overall Cognitive Status  Within Functional Limits for tasks assessed      Observation/Other Assessments   Observations  increased edema noted Lt knee      ROM / Strength   AROM / PROM / Strength  AROM;PROM;Strength      AROM   AROM Assessment Site  Knee    Right/Left Knee  Right;Left    Right Knee Extension  4    Right Knee Flexion  134    Left Knee Extension  -4    Left Knee Flexion  38      PROM   PROM Assessment Site  Knee    Right/Left Knee  Left    Left Knee Extension  0    Left Knee Flexion  62      Strength   Overall Strength Comments  deferred due to post op status; LLE grossly 3-4/5      Palpation   Palpation comment  very tender to palpation along anterior Lt knee      Ambulation/Gait   Assistive device  Crutches    Gait Pattern  Decreased stance time - left;Decreased step length - right;Decreased hip/knee flexion - left;Decreased weight shift to left                Objective measurements completed on examination: See above findings.      [facility] Adult PT Treatment/Exercise - 01/16/19 1324      Exercises   Exercises  Knee/Hip      Knee/Hip Exercises: Supine   Quad Sets  Left;5 reps    Heel Slides  AAROM;Left;5 reps    Straight Leg Raises  Left;5 reps      Modalities   Modalities  Vasopneumatic      Vasopneumatic   Number Minutes Vasopneumatic   6 minutes   stopped early due to pt needing to use restroom   Vasopnuematic Location   Knee    Vasopneumatic Pressure  Medium    Vasopneumatic Temperature   34 deg             PT Education - 01/16/19 1350    Education Details  HEP    Person(s) Educated  Patient  Methods  Explanation;Demonstration;Handout    Comprehension  Verbalized understanding;Returned demonstration;Need further instruction          PT Long Term Goals - 01/16/19 1355      PT LONG TERM GOAL #1    Title  independent with HEP    Status  New    Target Date  02/27/19      PT LONG TERM GOAL #2   Title  improve Lt knee AROM 0-115 for improved function    Status  New    Target Date  02/27/19      PT LONG TERM GOAL #3   Title  amb without AD independently without deviations for improved function    Status  New    Target Date  02/27/19      PT LONG TERM GOAL #4   Title  report pain < 4/10 with activity for improved pain    Status  New    Target Date  02/27/19      PT LONG TERM GOAL #5   Title  demonstrate at least 4+/5 LLE strength    Status  New    Target Date  02/27/19             Plan - 01/16/19 1351    Clinical Impression Statement  Pt is a 38 y/o female who presents to [place] s/p Lt knee scope with ganglion cyst debridement on 12/24/18.  Pt presents today with elevated pain ("10/10"), increased swelling, decreased mobility, strength and ROM affecting functional mobility.  Pt very guarded with all movement, and encouraged regular ROM exercises to increase function.  Pt will benefit from PT to address deficits listed.    Examination-Activity Limitations  Stand;Locomotion Level;Squat;Stairs    Examination-Participation Restrictions  Other   occupation   Stability/Clinical Decision Making  Stable/Uncomplicated    Clinical Decision Making  Low    Rehab Potential  Good    PT Frequency  2x / week    PT Duration  6 weeks    PT Treatment/Interventions  ADLs/Self Care Home Management;Cryotherapy;Electrical Stimulation;DME Instruction;Ultrasound;Moist Heat;Iontophoresis 4mg /ml Dexamethasone;Gait training;Stair training;Functional mobility training;Therapeutic activities;Therapeutic exercise;Balance training;Patient/family education;Neuromuscular re-education;Manual techniques;Vasopneumatic Device;Taping;Passive range of motion    PT Next Visit Plan  review HEP, continue with ROM, try amb with single crutch    PT Home Exercise Plan  Access Code: [email or web address]    Consulted and Agree  with Plan of Care  Patient       Patient will benefit from skilled therapeutic intervention in order to improve the following deficits and impairments:  Abnormal gait, Pain, Decreased mobility, Decreased range of motion, Decreased strength, Increased edema, Difficulty walking  Visit Diagnosis: Acute pain of left knee - Plan: PT plan of care cert/re-cert  Stiffness of left knee, not elsewhere classified - Plan: PT plan of care cert/re-cert  Other abnormalities of gait and mobility - Plan: PT plan of care cert/re-cert  Localized edema - Plan: PT plan of care cert/re-cert     Problem List Patient Active Problem List   Diagnosis Date Noted  . Bucket handle tear of lateral meniscus of left knee 12/12/2018  . Ganglion cyst 12/12/2018  . Pain in left knee 11/15/2018  . Fibroids 03/18/2018  . S/P parathyroidectomy 02/12/2018  . Primary hyperparathyroidism ([place]) 01/08/2018  . Hyperparathyroidism , secondary, non-renal ([place]) 01/08/2018  . Fibroid uterus 12/06/2017  . Hypercalcemia 12/06/2017  . Fibroid 10/15/2017  . Abnormal uterine bleeding (AUB) 08/20/2017  . Anemia 08/20/2017      Laureen Abrahams,  PT, DPT 01/16/19 1:58 PM     [facility] [address] [place], [place], [address] Phone: [phone]   Fax:  [phone]  Name: Katie Benson MRN: [id number] Date of Birth: 04/28/1980

## 2019-01-21 ENCOUNTER — Encounter: Payer: Self-pay | Admitting: Orthopedic Surgery

## 2019-01-21 ENCOUNTER — Other Ambulatory Visit: Payer: Self-pay

## 2019-01-21 ENCOUNTER — Ambulatory Visit (INDEPENDENT_AMBULATORY_CARE_PROVIDER_SITE_OTHER): Payer: Self-pay | Admitting: Orthopedic Surgery

## 2019-01-21 VITALS — Ht 63.0 in | Wt 159.0 lb

## 2019-01-21 DIAGNOSIS — M25462 Effusion, left knee: Secondary | ICD-10-CM

## 2019-01-21 MED ORDER — HYDROCODONE-ACETAMINOPHEN 5-325 MG PO TABS: 1.0000 | ORAL_TABLET | ORAL | 0 refills | Status: DC | PRN

## 2019-01-21 MED ORDER — LIDOCAINE HCL 1 % IJ SOLN
2.0000 mL | INTRAMUSCULAR | Status: AC | PRN
  Administered 2019-01-21: 2 mL

## 2019-01-21 NOTE — Progress Notes (Signed)
Office Visit Note   Patient: Katie Benson           Date of Birth: 1981-02-19           MRN: [id number] Visit Date: 01/21/2019              Requested by: Charlott Rakes, MD [place],   [id number] PCP: Charlott Rakes, MD   Assessment & Plan: Visit Diagnoses: No diagnosis found.  Plan:  #1: Attempted aspiration of the knee but yielded no fluid. #2: Going to hold her physical therapy at this time and let this settle down again. #3: Given a prescription for hydrocodone for pain #4: Crutches for pain at this time.   Follow-Up Instructions: Return in about 1 week (around 01/28/2019).   Orders:  No orders of the defined types were placed in this encounter.  Meds ordered this encounter  Medications  . HYDROcodone-acetaminophen (NORCO/VICODIN) 5-325 MG tablet    Sig: Take 1 tablet by mouth every 4 (four) hours as needed for moderate pain.    Dispense:  30 tablet    Refill:  0    Order Specific Question:   Supervising Provider    Answer:   [name] [[id number]]      Procedures: Large Joint Inj: L knee on 01/21/2019 6:27 PM Indications: joint swelling, pain and diagnostic evaluation Details: 18 G 1.5 in needle, medial approach Medications: 2 mL lidocaine 1 % Aspirate: 0 mL Outcome: tolerated well, no immediate complications Procedure, treatment alternatives, risks and benefits explained, specific risks discussed. Consent was given by the patient. Immediately prior to procedure a time out was called to verify the correct patient, procedure, equipment, support staff and site/side marked as required. Patient was prepped and draped in the usual sterile fashion.       Clinical Data: No additional findings.   Subjective: Chief Complaint  Patient presents with  . Left Knee - Follow-up    Left knee arthroscopy DOS 12/24/2018  HPI: Patient presents today for follow up on her left knee. She had a left knee arthroscopy on 12/24/2018. She is  now 1 month out from surgery. She is doing home exercises. She said that the home exercises caused more pain. She has pain medially and laterally, along with swelling. She has taken Aleve, but states that it only helps a little.  Physical therapy has been not working hard on her and apparently has exacerbated pain in the knee.  She walks in on crutches.   Review of Systems  Constitutional: Negative for fatigue.  HENT: Negative for ear pain.   Eyes: Negative for pain.  Respiratory: Negative for shortness of breath.   Cardiovascular: Negative for leg swelling.  Gastrointestinal: Positive for constipation. Negative for diarrhea.  Endocrine: Negative for cold intolerance and heat intolerance.  Genitourinary: Negative for difficulty urinating.  Musculoskeletal: Positive for joint swelling.  Skin: Negative for rash.  Allergic/Immunologic: Positive for food allergies.  Neurological: Negative for weakness.  Hematological: Does not bruise/bleed easily.  Psychiatric/Behavioral: Positive for sleep disturbance.     Objective: Vital Signs: Ht 5\' 3"  (1.6 m)   Wt 159 lb (72.1 kg)   LMP  (LMP Unknown) Comment: fibroid surgery in January 21   BMI 28.17 kg/m   Physical Exam Constitutional:      Appearance: She is well-developed.  Eyes:     Pupils: Pupils are equal, round, and reactive to light.  Pulmonary:     Effort: Pulmonary effort is normal.  Skin:    General: Skin is warm and dry.  Neurological:     Mental Status: She is alert and oriented to person, place, and time.  Psychiatric:        Behavior: Behavior normal.     Ortho Exam  She does have swelling about the knee.  Cannot tell if this is hemarthrosis.  She has limited range of motion now can fully extend the knee however she limits to only about 35 to 45 degrees of flexion without significant pain discomfort.  Little bit of swelling posteriorly.  Tender over the lateral posterior aspect of the knee.  Calf is supple nontender.   Specialty Comments:  No specialty comments available.  Imaging: No results found.   PMFS History: Current Outpatient Medications  Medication Sig Dispense Refill  . HYDROcodone-acetaminophen (NORCO/VICODIN) 5-325 MG tablet Take 1-2 tablets by mouth every 4 (four) hours as needed for moderate pain or severe pain. 30 tablet 0  . HYDROcodone-acetaminophen (NORCO/VICODIN) 5-325 MG tablet Take 1 tablet by mouth every 6 (six) hours as needed for moderate pain. 30 tablet 0  . hydrOXYzine (ATARAX/VISTARIL) 50 MG tablet Take 1-2 tablets (50-100 mg total) by mouth every 8 (eight) hours as needed for itching. 60 tablet 2  . megestrol (MEGACE) 40 MG tablet TAKE 1 TABLET(40 MG) BY MOUTH TWICE DAILY 60 tablet 4  . meloxicam (MOBIC) 7.5 MG tablet Take 1 tablet (7.5 mg total) by mouth daily. 30 tablet 1  . Menthol-Methyl Salicylate (MUSCLE RUB) 10-15 % CREA Apply 1 application topically as needed for muscle pain.    . naproxen sodium (ALEVE) 220 MG tablet Take 220 mg by mouth 2 (two) times daily as needed (pain).     Marland Kitchen oxyCODONE (ROXICODONE) 5 MG immediate release tablet Take 1-2 tablets (5-10 mg total) by mouth every 4 (four) hours as needed. 30 tablet 0  . pantoprazole (PROTONIX) 40 MG tablet Take 1 tablet (40 mg total) by mouth 2 (two) times daily. 60 tablet 11  . traZODone (DESYREL) 50 MG tablet Take 1 tablet (50 mg total) by mouth at bedtime as needed for sleep. 30 tablet 1  . HYDROcodone-acetaminophen (NORCO/VICODIN) 5-325 MG tablet Take 1 tablet by mouth every 4 (four) hours as needed for moderate pain. 30 tablet 0   No current facility-administered medications for this visit.     Patient Active Problem List   Diagnosis Date Noted  . Bucket handle tear of lateral meniscus of left knee 12/12/2018  . Ganglion cyst 12/12/2018  . Pain in left knee 11/15/2018  . Fibroids 03/18/2018  . S/P parathyroidectomy 02/12/2018  . Primary hyperparathyroidism ([place]) 01/08/2018  . Hyperparathyroidism ,  secondary, non-renal ([place]) 01/08/2018  . Fibroid uterus 12/06/2017  . Hypercalcemia 12/06/2017  . Fibroid 10/15/2017  . Abnormal uterine bleeding (AUB) 08/20/2017  . Anemia 08/20/2017   Past Medical History:  Diagnosis Date  . Anemia   . Fibroids   . GERD (gastroesophageal reflux disease)   . History of kidney stones   . Neck injury     Family History  Problem Relation Age of Onset  . Diabetes Sister   . Cancer Maternal Grandmother        ovarian  . Hypercalcemia Mother     Past Surgical History:  Procedure Laterality Date  . CESAREAN SECTION    . IR ANGIOGRAM PELVIS SELECTIVE OR SUPRASELECTIVE  03/18/2018  . IR ANGIOGRAM PELVIS SELECTIVE OR SUPRASELECTIVE  03/18/2018  . IR ANGIOGRAM SELECTIVE EACH ADDITIONAL VESSEL  03/18/2018  .  IR ANGIOGRAM SELECTIVE EACH ADDITIONAL VESSEL  03/18/2018  . IR EMBO TUMOR ORGAN ISCHEMIA INFARCT INC GUIDE ROADMAPPING  03/18/2018  . IR RADIOLOGIST EVAL & MGMT  10/17/2017  . IR RADIOLOGIST EVAL & MGMT  12/20/2017  . IR RADIOLOGIST EVAL & MGMT  04/17/2018  . IR US GUIDE VASC ACCESS RIGHT  03/18/2018  . KNEE ARTHROSCOPY WITH LATERAL MENISECTOMY Left 12/24/2018   Procedure: LEFT KNEE ARTHROSCOPY WITH LATERAL MENISECTOM, debriment of GANGLION CYST;  Surgeon: [name], MD;  Location: [facility];  Service: Orthopedics;  Laterality: Left;  . PARATHYROIDECTOMY N/A 02/12/2018   Procedure: PARATHYROIDECTOMY;  Surgeon: Fredirick Maudlin, MD;  Location: [facility];  Service: General;  Laterality: N/A;  . WISDOM TOOTH EXTRACTION     age 54   Social History   Occupational History  . Not on file  Tobacco Use  . Smoking status: Current Every Day Smoker    Packs/day: 0.25    Years: 14.00    Pack years: 3.50    Types: Cigarettes  . Smokeless tobacco: Never Used  Substance and Sexual Activity  . Alcohol use: Yes    Comment: occassionally on weekends  . Drug use: Yes    Types: Marijuana    Comment: last used 12/14/2018-marijuana  . Sexual activity: Yes     Birth control/protection: Condom, None

## 2019-01-31 ENCOUNTER — Encounter: Payer: Self-pay | Admitting: Physical Therapy

## 2019-02-04 ENCOUNTER — Encounter: Payer: Self-pay | Admitting: Physical Therapy

## 2019-02-04 ENCOUNTER — Ambulatory Visit (INDEPENDENT_AMBULATORY_CARE_PROVIDER_SITE_OTHER): Payer: Self-pay | Admitting: Physical Therapy

## 2019-02-04 ENCOUNTER — Other Ambulatory Visit: Payer: Self-pay

## 2019-02-04 ENCOUNTER — Encounter: Payer: Self-pay | Admitting: Orthopaedic Surgery

## 2019-02-04 ENCOUNTER — Ambulatory Visit (INDEPENDENT_AMBULATORY_CARE_PROVIDER_SITE_OTHER): Payer: Self-pay | Admitting: Orthopaedic Surgery

## 2019-02-04 VITALS — Ht 63.0 in | Wt 159.0 lb

## 2019-02-04 DIAGNOSIS — M25662 Stiffness of left knee, not elsewhere classified: Secondary | ICD-10-CM

## 2019-02-04 DIAGNOSIS — M25562 Pain in left knee: Secondary | ICD-10-CM

## 2019-02-04 DIAGNOSIS — S83252D Bucket-handle tear of lateral meniscus, current injury, left knee, subsequent encounter: Secondary | ICD-10-CM

## 2019-02-04 DIAGNOSIS — R2689 Other abnormalities of gait and mobility: Secondary | ICD-10-CM

## 2019-02-04 DIAGNOSIS — R600 Localized edema: Secondary | ICD-10-CM

## 2019-02-04 NOTE — Progress Notes (Signed)
Office Visit Note   Patient: Katie Benson           Date of Birth: Jul 10, 1980           MRN: [id number] Visit Date: 02/04/2019              Requested by: Charlott Rakes, MD [place],  [place] [id number] PCP: Charlott Rakes, MD   Assessment & Plan: Visit Diagnoses:  1. Bucket handle tear of lateral meniscus of left knee, unspecified whether old or current tear, subsequent encounter   2.      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  Plan:  #1: She is going to start therapy again and she will do that through this afternoon here. #2: Instructed her in appropriate exercises.  She will need to change her crutch from her left hand to her right hand to walk.  Follow-Up Instructions: Return in about 2 weeks (around 02/18/2019).   Orders:  No orders of the defined types were placed in this encounter.  No orders of the defined types were placed in this encounter.     Procedures: No procedures performed   Clinical Data: No additional findings.   Subjective: Chief Complaint  Patient presents with  . Left Knee - Follow-up  Patient presents today for follow up on her left knee. She had a left knee arthroscopy on 12/24/2018. She is now 6 weeks out from surgery. She is still wearing her knee immobilizer. She said that her knee is improving. She does not have pain when wearing the immobilizer. She is not in therapy currently and not taking hydrocodone.   HPI  Review of Systems  Constitutional: Negative for fatigue.  HENT: Negative for ear pain.   Eyes: Negative for pain.  Respiratory: Negative for shortness of breath.   Cardiovascular: Negative for leg swelling.  Gastrointestinal: Negative for constipation and diarrhea.  Endocrine: Negative for cold intolerance and heat intolerance.  Genitourinary: Negative for difficulty urinating.  Musculoskeletal: Positive for joint swelling.  Skin: Negative for rash.  Allergic/Immunologic: Positive for food  allergies.  Neurological: Negative for weakness.  Hematological: Does not bruise/bleed easily.  Psychiatric/Behavioral: Positive for sleep disturbance.     Objective: Vital Signs: Ht 5\' 3"  (1.6 m)   Wt 159 lb (72.1 kg)   BMI 28.17 kg/m   Physical Exam Constitutional:      Appearance: She is well-developed.  Eyes:     Pupils: Pupils are equal, round, and reactive to light.  Pulmonary:     Effort: Pulmonary effort is normal.  Skin:    General: Skin is warm and dry.  Neurological:     Mental Status: She is alert and oriented to person, place, and time.  Psychiatric:        Behavior: Behavior normal.     Ortho Exam  Exam today reveals the swelling to be much improved in the knee itself on the left.  I can only get about 30 degrees of flexion before she stops.  No erythema or warmth.  Calf is supple and nontender.  Neurovascular intact distally.  Wounds are well-healed.  Specialty Comments:  No specialty comments available.  Imaging: No results found.   PMFS History: Current Outpatient Medications  Medication Sig Dispense Refill  . HYDROcodone-acetaminophen (NORCO/VICODIN) 5-325 MG tablet Take 1-2 tablets by mouth every 4 (four) hours as needed for moderate pain or severe pain. 30 tablet 0  . HYDROcodone-acetaminophen (NORCO/VICODIN) 5-325 MG tablet Take 1 tablet by mouth  every 6 (six) hours as needed for moderate pain. 30 tablet 0  . HYDROcodone-acetaminophen (NORCO/VICODIN) 5-325 MG tablet Take 1 tablet by mouth every 4 (four) hours as needed for moderate pain. 30 tablet 0  . hydrOXYzine (ATARAX/VISTARIL) 50 MG tablet Take 1-2 tablets (50-100 mg total) by mouth every 8 (eight) hours as needed for itching. 60 tablet 2  . megestrol (MEGACE) 40 MG tablet TAKE 1 TABLET(40 MG) BY MOUTH TWICE DAILY 60 tablet 4  . meloxicam (MOBIC) 7.5 MG tablet Take 1 tablet (7.5 mg total) by mouth daily. 30 tablet 1  . Menthol-Methyl Salicylate (MUSCLE RUB) 10-15 % CREA Apply 1 application  topically as needed for muscle pain.    . naproxen sodium (ALEVE) 220 MG tablet Take 220 mg by mouth 2 (two) times daily as needed (pain).     Marland Kitchen oxyCODONE (ROXICODONE) 5 MG immediate release tablet Take 1-2 tablets (5-10 mg total) by mouth every 4 (four) hours as needed. 30 tablet 0  . pantoprazole (PROTONIX) 40 MG tablet Take 1 tablet (40 mg total) by mouth 2 (two) times daily. 60 tablet 11  . traZODone (DESYREL) 50 MG tablet Take 1 tablet (50 mg total) by mouth at bedtime as needed for sleep. 30 tablet 1   No current facility-administered medications for this visit.     Patient Active Problem List   Diagnosis Date Noted  . Bucket handle tear of lateral meniscus of left knee 12/12/2018  . Ganglion cyst 12/12/2018  . Pain in left knee 11/15/2018  . Fibroids 03/18/2018  . S/P parathyroidectomy 02/12/2018  . Primary hyperparathyroidism ([place]) 01/08/2018  . Hyperparathyroidism , secondary, non-renal ([place]) 01/08/2018  . Fibroid uterus 12/06/2017  . Hypercalcemia 12/06/2017  . Fibroid 10/15/2017  . Abnormal uterine bleeding (AUB) 08/20/2017  . Anemia 08/20/2017   Past Medical History:  Diagnosis Date  . Anemia   . Fibroids   . GERD (gastroesophageal reflux disease)   . History of kidney stones   . Neck injury     Family History  Problem Relation Age of Onset  . Diabetes Sister   . Cancer Maternal Grandmother        ovarian  . Hypercalcemia Mother     Past Surgical History:  Procedure Laterality Date  . CESAREAN SECTION    . IR ANGIOGRAM PELVIS SELECTIVE OR SUPRASELECTIVE  03/18/2018  . IR ANGIOGRAM PELVIS SELECTIVE OR SUPRASELECTIVE  03/18/2018  . IR ANGIOGRAM SELECTIVE EACH ADDITIONAL VESSEL  03/18/2018  . IR ANGIOGRAM SELECTIVE EACH ADDITIONAL VESSEL  03/18/2018  . IR EMBO TUMOR ORGAN ISCHEMIA INFARCT INC GUIDE ROADMAPPING  03/18/2018  . IR RADIOLOGIST EVAL & MGMT  10/17/2017  . IR RADIOLOGIST EVAL & MGMT  12/20/2017  . IR RADIOLOGIST EVAL & MGMT  04/17/2018  . IR US GUIDE VASC  ACCESS RIGHT  03/18/2018  . KNEE ARTHROSCOPY WITH LATERAL MENISECTOMY Left 12/24/2018   Procedure: LEFT KNEE ARTHROSCOPY WITH LATERAL MENISECTOM, debriment of GANGLION CYST;  Surgeon: [name], MD;  Location: [facility];  Service: Orthopedics;  Laterality: Left;  . PARATHYROIDECTOMY N/A 02/12/2018   Procedure: PARATHYROIDECTOMY;  Surgeon: Fredirick Maudlin, MD;  Location: [facility];  Service: General;  Laterality: N/A;  . WISDOM TOOTH EXTRACTION     age 39   Social History   Occupational History  . Not on file  Tobacco Use  . Smoking status: Current Every Day Smoker    Packs/day: 0.25    Years: 14.00    Pack years: 3.50  Types: Cigarettes  . Smokeless tobacco: Never Used  Substance and Sexual Activity  . Alcohol use: Yes    Comment: occassionally on weekends  . Drug use: Yes    Types: Marijuana    Comment: last used 12/14/2018-marijuana  . Sexual activity: Yes    Birth control/protection: Condom, None

## 2019-02-04 NOTE — Therapy (Signed)
[facility] [address] [place], [place], [address] Phone: [phone]   Fax:  [phone]  Physical Therapy Treatment  Patient Details  Name: Katie Benson MRN: [id number] Date of Birth: September 15, 1980 Referring Provider (PT): Dr. Joni Fears   Encounter Date: 02/04/2019  PT End of Session - 02/04/19 1539    Visit Number  2    Number of Visits  12    Date for PT Re-Evaluation  02/27/19    PT Start Time  7616    PT Stop Time  1547    PT Time Calculation (min)  48 min    Activity Tolerance  Patient tolerated treatment well    Behavior During Therapy  [facility] for tasks assessed/performed       Past Medical History:  Diagnosis Date  . Anemia   . Fibroids   . GERD (gastroesophageal reflux disease)   . History of kidney stones   . Neck injury     Past Surgical History:  Procedure Laterality Date  . CESAREAN SECTION    . IR ANGIOGRAM PELVIS SELECTIVE OR SUPRASELECTIVE  03/18/2018  . IR ANGIOGRAM PELVIS SELECTIVE OR SUPRASELECTIVE  03/18/2018  . IR ANGIOGRAM SELECTIVE EACH ADDITIONAL VESSEL  03/18/2018  . IR ANGIOGRAM SELECTIVE EACH ADDITIONAL VESSEL  03/18/2018  . IR EMBO TUMOR ORGAN ISCHEMIA INFARCT INC GUIDE ROADMAPPING  03/18/2018  . IR RADIOLOGIST EVAL & MGMT  10/17/2017  . IR RADIOLOGIST EVAL & MGMT  12/20/2017  . IR RADIOLOGIST EVAL & MGMT  04/17/2018  . IR US GUIDE VASC ACCESS RIGHT  03/18/2018  . KNEE ARTHROSCOPY WITH LATERAL MENISECTOMY Left 12/24/2018   Procedure: LEFT KNEE ARTHROSCOPY WITH LATERAL MENISECTOM, debriment of GANGLION CYST;  Surgeon: [name], MD;  Location: [facility];  Service: Orthopedics;  Laterality: Left;  . PARATHYROIDECTOMY N/A 02/12/2018   Procedure: PARATHYROIDECTOMY;  Surgeon: Fredirick Maudlin, MD;  Location: [facility];  Service: General;  Laterality: N/A;  . WISDOM TOOTH EXTRACTION     age 38    There were no vitals filed for this visit.  Subjective Assessment - 02/04/19 1538    Subjective   trying to do some exercises at home, but pain stays at a 7/10.  saw PA today, wants to continue therapy, take it slow    Limitations  Standing;Walking    How long can you stand comfortably?  10 min    How long can you walk comfortably?  5 min    Patient Stated Goals  improve pain, motion    Currently in Pain?  Yes    Pain Score  7     Pain Location  Knee    Pain Orientation  Left    Pain Descriptors / Indicators  Aching;Sharp    Pain Type  Acute pain;Surgical pain    Pain Onset  1 to 4 weeks ago    Pain Frequency  Constant    Aggravating Factors   standing, walking, bending    Pain Relieving Factors  meds, ice         [facility] PT Assessment - 02/04/19 1538      Assessment   Medical Diagnosis  S/P left knee arthroscopy with lateral meniscectomy and ganglion cyst debridement.    Referring Provider (PT)  Dr. Joni Fears    Onset Date/Surgical Date  12/24/18    Hand Dominance  Right    Next MD Visit  02/20/19      AROM   Left Knee Extension  0  Left Knee Flexion  68   active assistive                  [facility] Adult PT Treatment/Exercise - 02/04/19 1524      Knee/Hip Exercises: Supine   Quad Sets  Left;10 reps    Quad Sets Limitations  improved quad control today    Short Arc Quad Sets  Left;10 reps    Straight Leg Raises  Left;5 reps    Straight Leg Raises Limitations  raise/lower/raise/hold pattern      Modalities   Modalities  Ultrasound      Ultrasound   Ultrasound Location  Lt anterior knee    Ultrasound Parameters  1.0 w/cm2, 100% DC, 9mz freq x 8 min    Ultrasound Goals  Pain;Edema      Vasopneumatic   Number Minutes Vasopneumatic   10 minutes    Vasopnuematic Location   Knee    Vasopneumatic Pressure  Medium    Vasopneumatic Temperature   34 deg      Manual Therapy   Manual Therapy  Soft tissue mobilization    Soft tissue mobilization  Rt ant tib for desensitization and TPR to ant tib                  PT Long Term Goals -  01/16/19 1355      PT LONG TERM GOAL #1   Title  independent with HEP    Status  New    Target Date  02/27/19      PT LONG TERM GOAL #2   Title  improve Lt knee AROM 0-115 for improved function    Status  New    Target Date  02/27/19      PT LONG TERM GOAL #3   Title  amb without AD independently without deviations for improved function    Status  New    Target Date  02/27/19      PT LONG TERM GOAL #4   Title  report pain < 4/10 with activity for improved pain    Status  New    Target Date  02/27/19      PT LONG TERM GOAL #5   Title  demonstrate at least 4+/5 LLE strength    Status  New    Target Date  02/27/19            Plan - 02/04/19 1540    Clinical Impression Statement  Pt presents today with continued elevated pain levels, but repors decreased slightly to 6/10 after session.  Overall slowly seeing improvements in ROM and strength.  Will continue to benefit from PT to maximize function, no goals met as only 2nd visit.    Examination-Activity Limitations  Stand;Locomotion Level;Squat;Stairs    Examination-Participation Restrictions  Other   occupation   Stability/Clinical Decision Making  Stable/Uncomplicated    Rehab Potential  Good    PT Frequency  2x / week    PT Duration  6 weeks    PT Treatment/Interventions  ADLs/Self Care Home Management;Cryotherapy;Electrical Stimulation;DME Instruction;Ultrasound;Moist Heat;Iontophoresis '4mg'$ /ml Dexamethasone;Gait training;Stair training;Functional mobility training;Therapeutic activities;Therapeutic exercise;Balance training;Patient/family education;Neuromuscular re-education;Manual techniques;Vasopneumatic Device;Taping;Passive range of motion    PT Next Visit Plan  review HEP, continue with ROM, try amb with single crutch    PT Home Exercise Plan  Access Code: [email or web address]    Consulted and Agree with Plan of Care  Patient       Patient will benefit from skilled therapeutic intervention in order to  improve the following  deficits and impairments:  Abnormal gait, Pain, Decreased mobility, Decreased range of motion, Decreased strength, Increased edema, Difficulty walking  Visit Diagnosis: Acute pain of left knee  Stiffness of left knee, not elsewhere classified  Other abnormalities of gait and mobility  Localized edema     Problem List Patient Active Problem List   Diagnosis Date Noted  . Bucket handle tear of lateral meniscus of left knee 12/12/2018  . Ganglion cyst 12/12/2018  . Pain in left knee 11/15/2018  . Fibroids 03/18/2018  . S/P parathyroidectomy 02/12/2018  . Primary hyperparathyroidism ([place]) 01/08/2018  . Hyperparathyroidism , secondary, non-renal ([place]) 01/08/2018  . Fibroid uterus 12/06/2017  . Hypercalcemia 12/06/2017  . Fibroid 10/15/2017  . Abnormal uterine bleeding (AUB) 08/20/2017  . Anemia 08/20/2017      Laureen Abrahams, PT, DPT 02/04/19 3:52 PM     [facility] [address] [place], [place], [address] Phone: [phone]   Fax:  [phone]  Name: Katie Benson MRN: [id number] Date of Birth: 1980-07-04

## 2019-02-06 ENCOUNTER — Encounter: Payer: Self-pay | Admitting: Physical Therapy

## 2019-02-06 ENCOUNTER — Ambulatory Visit: Payer: Self-pay | Admitting: Orthopaedic Surgery

## 2019-02-06 ENCOUNTER — Ambulatory Visit (INDEPENDENT_AMBULATORY_CARE_PROVIDER_SITE_OTHER): Payer: Self-pay | Admitting: Physical Therapy

## 2019-02-06 ENCOUNTER — Other Ambulatory Visit: Payer: Self-pay

## 2019-02-06 DIAGNOSIS — M25562 Pain in left knee: Secondary | ICD-10-CM

## 2019-02-06 DIAGNOSIS — R2689 Other abnormalities of gait and mobility: Secondary | ICD-10-CM

## 2019-02-06 DIAGNOSIS — R600 Localized edema: Secondary | ICD-10-CM

## 2019-02-06 DIAGNOSIS — M25662 Stiffness of left knee, not elsewhere classified: Secondary | ICD-10-CM

## 2019-02-06 NOTE — Therapy (Signed)
[facility] [address] [place], [place], [address] Phone: [phone]   Fax:  [phone]  Physical Therapy Treatment  Patient Details  Name: Katie Benson MRN: [id number] Date of Birth: 1980-04-27 Referring Provider (PT): Dr. Joni Fears   Encounter Date: 02/06/2019  PT End of Session - 02/06/19 1521    Visit Number  3    Number of Visits  12    Date for PT Re-Evaluation  02/27/19    PT Start Time  [id number]    PT Stop Time  1522    PT Time Calculation (min)  39 min    Activity Tolerance  Patient tolerated treatment well    Behavior During Therapy  [facility] for tasks assessed/performed       Past Medical History:  Diagnosis Date  . Anemia   . Fibroids   . GERD (gastroesophageal reflux disease)   . History of kidney stones   . Neck injury     Past Surgical History:  Procedure Laterality Date  . CESAREAN SECTION    . IR ANGIOGRAM PELVIS SELECTIVE OR SUPRASELECTIVE  03/18/2018  . IR ANGIOGRAM PELVIS SELECTIVE OR SUPRASELECTIVE  03/18/2018  . IR ANGIOGRAM SELECTIVE EACH ADDITIONAL VESSEL  03/18/2018  . IR ANGIOGRAM SELECTIVE EACH ADDITIONAL VESSEL  03/18/2018  . IR EMBO TUMOR ORGAN ISCHEMIA INFARCT INC GUIDE ROADMAPPING  03/18/2018  . IR RADIOLOGIST EVAL & MGMT  10/17/2017  . IR RADIOLOGIST EVAL & MGMT  12/20/2017  . IR RADIOLOGIST EVAL & MGMT  04/17/2018  . IR US GUIDE VASC ACCESS RIGHT  03/18/2018  . KNEE ARTHROSCOPY WITH LATERAL MENISECTOMY Left 12/24/2018   Procedure: LEFT KNEE ARTHROSCOPY WITH LATERAL MENISECTOM, debriment of GANGLION CYST;  Surgeon: [name], MD;  Location: [facility];  Service: Orthopedics;  Laterality: Left;  . PARATHYROIDECTOMY N/A 02/12/2018   Procedure: PARATHYROIDECTOMY;  Surgeon: Fredirick Maudlin, MD;  Location: [facility];  Service: General;  Laterality: N/A;  . WISDOM TOOTH EXTRACTION     age 38    There were no vitals filed for this visit.  Subjective Assessment - 02/06/19 1448    Subjective   trying to do more without the brace.  knee is still painful but overall doing well.    Limitations  Standing;Walking    How long can you stand comfortably?  10 min    How long can you walk comfortably?  5 min    Patient Stated Goals  improve pain, motion    Pain Score  6     Pain Location  Knee    Pain Orientation  Left    Pain Descriptors / Indicators  Aching;Sharp    Pain Type  Acute pain;Surgical pain    Pain Onset  1 to 4 weeks ago    Pain Frequency  Constant    Aggravating Factors   standing, walking, bending    Pain Relieving Factors  meds, ice                       [facility] Adult PT Treatment/Exercise - 02/06/19 1511      Knee/Hip Exercises: Stretches   Quad Stretch  Left;5 reps;20 seconds    Quad Stretch Limitations  prone with strap      Knee/Hip Exercises: Supine   [employer]  Left;20 reps    Short Arc [employer]  Left;20 reps    Straight Leg Raises  Left;10 reps    Straight Leg Raise with External Rotation  Left;5  reps      Ultrasound   Ultrasound Location  Lt anterior knee    Ultrasound Parameters  1.0 w/cm2, 100% DC, 26mHz freq x 8 min    Ultrasound Goals  Pain;Edema      Manual Therapy   Manual Therapy  Soft tissue mobilization    Soft tissue mobilization  IASTM to Lt knee                  PT Long Term Goals - 01/16/19 1355      PT LONG TERM GOAL #1   Title  independent with HEP    Status  New    Target Date  02/27/19      PT LONG TERM GOAL #2   Title  improve Lt knee AROM 0-115 for improved function    Status  New    Target Date  02/27/19      PT LONG TERM GOAL #3   Title  amb without AD independently without deviations for improved function    Status  New    Target Date  02/27/19      PT LONG TERM GOAL #4   Title  report pain < 4/10 with activity for improved pain    Status  New    Target Date  02/27/19      PT LONG TERM GOAL #5   Title  demonstrate at least 4+/5 LLE strength    Status  New    Target Date  02/27/19             Plan - 02/06/19 1522    Clinical Impression Statement  Pt slowly progressing well towards goals reporting increased activity tolerance and decreased pain.  Will continue to benefit from PT to maximize function.    Examination-Activity Limitations  Stand;Locomotion Level;Squat;Stairs    Examination-Participation Restrictions  Other   occupation   Stability/Clinical Decision Making  Stable/Uncomplicated    Rehab Potential  Good    PT Frequency  2x / week    PT Duration  6 weeks    PT Treatment/Interventions  ADLs/Self Care Home Management;Cryotherapy;Electrical Stimulation;DME Instruction;Ultrasound;Moist Heat;Iontophoresis 4mg /ml Dexamethasone;Gait training;Stair training;Functional mobility training;Therapeutic activities;Therapeutic exercise;Balance training;Patient/family education;Neuromuscular re-education;Manual techniques;Vasopneumatic Device;Taping;Passive range of motion    PT Next Visit Plan  review HEP, continue with ROM, try amb with single crutch    PT Home Exercise Plan  Access Code: [email or web address]    Consulted and Agree with Plan of Care  Patient       Patient will benefit from skilled therapeutic intervention in order to improve the following deficits and impairments:  Abnormal gait, Pain, Decreased mobility, Decreased range of motion, Decreased strength, Increased edema, Difficulty walking  Visit Diagnosis: Acute pain of left knee  Stiffness of left knee, not elsewhere classified  Other abnormalities of gait and mobility  Localized edema     Problem List Patient Active Problem List   Diagnosis Date Noted  . Bucket handle tear of lateral meniscus of left knee 12/12/2018  . Ganglion cyst 12/12/2018  . Pain in left knee 11/15/2018  . Fibroids 03/18/2018  . S/P parathyroidectomy 02/12/2018  . Primary hyperparathyroidism ([place]) 01/08/2018  . Hyperparathyroidism , secondary, non-renal ([place]) 01/08/2018  . Fibroid uterus 12/06/2017  . Hypercalcemia  12/06/2017  . Fibroid 10/15/2017  . Abnormal uterine bleeding (AUB) 08/20/2017  . Anemia 08/20/2017        Laureen Abrahams, PT, DPT 02/06/19 3:26 PM      [facility] [phone]  [place], [place], [address] Phone: [phone]   Fax:  [phone]  Name: Katie Benson MRN: [id number] Date of Birth: 1981-01-02

## 2019-02-11 ENCOUNTER — Encounter: Payer: Self-pay | Admitting: Physical Therapy

## 2019-02-11 ENCOUNTER — Ambulatory Visit (INDEPENDENT_AMBULATORY_CARE_PROVIDER_SITE_OTHER): Payer: Self-pay | Admitting: Physical Therapy

## 2019-02-11 ENCOUNTER — Other Ambulatory Visit: Payer: Self-pay

## 2019-02-11 DIAGNOSIS — R600 Localized edema: Secondary | ICD-10-CM

## 2019-02-11 DIAGNOSIS — M25562 Pain in left knee: Secondary | ICD-10-CM

## 2019-02-11 DIAGNOSIS — M25662 Stiffness of left knee, not elsewhere classified: Secondary | ICD-10-CM

## 2019-02-11 DIAGNOSIS — R2689 Other abnormalities of gait and mobility: Secondary | ICD-10-CM

## 2019-02-11 NOTE — Patient Instructions (Signed)
Access Code: [email or web address]  URL: [email or web address]  Date: 02/11/2019  Prepared by: Faustino Congress   Exercises Supine Quadricep Sets - 10 reps - 1 sets - 5 sec hold - 3x daily - 7x weekly Small Range Straight Leg Raise - 10 reps - 1 sets - 3x daily - 7x weekly Supine Heel Slide with Strap - 10 reps - 1 sets - 3x daily - 7x weekly Sidelying Knee Flexion and Extension in Abduction - 10 reps - 1 sets - 3x daily - 7x weekly Seated Knee Flexion Stretch - 10 reps - 1 sets - 10 sec hold - 3x daily - 7x weekly

## 2019-02-11 NOTE — Therapy (Signed)
[facility] [address] [place], [place], [address] Phone: [phone]   Fax:  [phone]  Physical Therapy Treatment  Patient Details  Name: Katie Benson MRN: [id number] Date of Birth: 02/03/1981 Referring Provider (PT): Dr. Joni Fears   Encounter Date: 02/11/2019  PT End of Session - 02/11/19 1523    Visit Number  4    Number of Visits  12    Date for PT Re-Evaluation  02/27/19    PT Start Time  1442    PT Stop Time  1523    PT Time Calculation (min)  41 min    Activity Tolerance  Patient tolerated treatment well    Behavior During Therapy  [facility] for tasks assessed/performed       Past Medical History:  Diagnosis Date  . Anemia   . Fibroids   . GERD (gastroesophageal reflux disease)   . History of kidney stones   . Neck injury     Past Surgical History:  Procedure Laterality Date  . CESAREAN SECTION    . IR ANGIOGRAM PELVIS SELECTIVE OR SUPRASELECTIVE  03/18/2018  . IR ANGIOGRAM PELVIS SELECTIVE OR SUPRASELECTIVE  03/18/2018  . IR ANGIOGRAM SELECTIVE EACH ADDITIONAL VESSEL  03/18/2018  . IR ANGIOGRAM SELECTIVE EACH ADDITIONAL VESSEL  03/18/2018  . IR EMBO TUMOR ORGAN ISCHEMIA INFARCT INC GUIDE ROADMAPPING  03/18/2018  . IR RADIOLOGIST EVAL & MGMT  10/17/2017  . IR RADIOLOGIST EVAL & MGMT  12/20/2017  . IR RADIOLOGIST EVAL & MGMT  04/17/2018  . IR US GUIDE VASC ACCESS RIGHT  03/18/2018  . KNEE ARTHROSCOPY WITH LATERAL MENISECTOMY Left 12/24/2018   Procedure: LEFT KNEE ARTHROSCOPY WITH LATERAL MENISECTOM, debriment of GANGLION CYST;  Surgeon: [name], MD;  Location: [facility];  Service: Orthopedics;  Laterality: Left;  . PARATHYROIDECTOMY N/A 02/12/2018   Procedure: PARATHYROIDECTOMY;  Surgeon: Fredirick Maudlin, MD;  Location: [facility];  Service: General;  Laterality: N/A;  . WISDOM TOOTH EXTRACTION     age 38    There were no vitals filed for this visit.  Subjective Assessment - 02/11/19 1437    Subjective   knee is hurting more today    Limitations  Standing;Walking    How long can you stand comfortably?  10 min    How long can you walk comfortably?  5 min    Patient Stated Goals  improve pain, motion    Currently in Pain?  Yes    Pain Score  8     Pain Location  Knee    Pain Orientation  Left    Pain Descriptors / Indicators  Aching;Sharp    Pain Type  Acute pain;Surgical pain    Pain Onset  1 to 4 weeks ago    Pain Frequency  Constant    Aggravating Factors   standing, walking, bending    Pain Relieving Factors  meds, ice         [facility] PT Assessment - 02/11/19 1518      Assessment   Medical Diagnosis  S/P left knee arthroscopy with lateral meniscectomy and ganglion cyst debridement.    Referring Provider (PT)  Dr. Joni Fears    Onset Date/Surgical Date  12/24/18    Hand Dominance  Right    Next MD Visit  02/20/19      AROM   Left Knee Extension  0    Left Knee Flexion  117   sidelying  [facility] Adult PT Treatment/Exercise - 02/11/19 1507      Knee/Hip Exercises: Seated   Heel Slides  Left;5 reps    Heel Slides Limitations  with scoot forward for increased flexion      Knee/Hip Exercises: Supine   Short Arc Quad Sets  Left;20 reps    Straight Leg Raises  Left;10 reps    Straight Leg Raise with External Rotation  Left;10 reps      Knee/Hip Exercises: Sidelying   Other Sidelying Knee/Hip Exercises  knee flexion x 5 reps      Ultrasound   Ultrasound Location  Lt anterior knee    Ultrasound Parameters  1.0 w/cm2, 100% DC, 5mz freq x 8 min    Ultrasound Goals  Pain;Edema      Manual Therapy   Soft tissue mobilization  IASTM to Lt knee and ant tib             PT Education - 02/11/19 1523    Education Details  HEP    Person(s) Educated  Patient    Methods  Explanation;Demonstration;Handout    Comprehension  Verbalized understanding;Returned demonstration          PT Long Term Goals - 01/16/19 1355      PT LONG TERM  GOAL #1   Title  independent with HEP    Status  New    Target Date  02/27/19      PT LONG TERM GOAL #2   Title  improve Lt knee AROM 0-115 for improved function    Status  New    Target Date  02/27/19      PT LONG TERM GOAL #3   Title  amb without AD independently without deviations for improved function    Status  New    Target Date  02/27/19      PT LONG TERM GOAL #4   Title  report pain < 4/10 with activity for improved pain    Status  New    Target Date  02/27/19      PT LONG TERM GOAL #5   Title  demonstrate at least 4+/5 LLE strength    Status  New    Target Date  02/27/19            Plan - 02/11/19 1523    Clinical Impression Statement  Pt continues to progress with PT, and Lt knee flexion improved to 117 in sidelying.  Would like to see this met supine as well, but pt limited due to increased pain.  Will continue to benefit from PT to maximize function.    Examination-Activity Limitations  Stand;Locomotion Level;Squat;Stairs    Examination-Participation Restrictions  Other   occupation   Stability/Clinical Decision Making  Stable/Uncomplicated    Rehab Potential  Good    PT Frequency  2x / week    PT Duration  6 weeks    PT Treatment/Interventions  ADLs/Self Care Home Management;Cryotherapy;Electrical Stimulation;DME Instruction;Ultrasound;Moist Heat;Iontophoresis 463mml Dexamethasone;Gait training;Stair training;Functional mobility training;Therapeutic activities;Therapeutic exercise;Balance training;Patient/family education;Neuromuscular re-education;Manual techniques;Vasopneumatic Device;Taping;Passive range of motion    PT Next Visit Plan  continue with ROM/strength, work on gait without crutch if tolerated    PT Home Exercise Plan  Access Code: [email or web address]    Consulted and Agree with Plan of Care  Patient       Patient will benefit from skilled therapeutic intervention in order to improve the following deficits and impairments:  Abnormal gait, Pain,  Decreased mobility, Decreased range of motion, Decreased strength, Increased  edema, Difficulty walking  Visit Diagnosis: Acute pain of left knee  Stiffness of left knee, not elsewhere classified  Other abnormalities of gait and mobility  Localized edema     Problem List Patient Active Problem List   Diagnosis Date Noted  . Bucket handle tear of lateral meniscus of left knee 12/12/2018  . Ganglion cyst 12/12/2018  . Pain in left knee 11/15/2018  . Fibroids 03/18/2018  . S/P parathyroidectomy 02/12/2018  . Primary hyperparathyroidism ([place]) 01/08/2018  . Hyperparathyroidism , secondary, non-renal ([place]) 01/08/2018  . Fibroid uterus 12/06/2017  . Hypercalcemia 12/06/2017  . Fibroid 10/15/2017  . Abnormal uterine bleeding (AUB) 08/20/2017  . Anemia 08/20/2017      Laureen Abrahams, PT, DPT 02/11/19 3:27 PM     [facility] [address] [place], [place], [address] Phone: [phone]   Fax:  [phone]  Name: Katie Benson MRN: [id number] Date of Birth: Dec 09, 1980

## 2019-02-13 ENCOUNTER — Other Ambulatory Visit: Payer: Self-pay

## 2019-02-13 ENCOUNTER — Encounter: Payer: Self-pay | Admitting: Physical Therapy

## 2019-02-13 ENCOUNTER — Ambulatory Visit (INDEPENDENT_AMBULATORY_CARE_PROVIDER_SITE_OTHER): Payer: Self-pay | Admitting: Physical Therapy

## 2019-02-13 DIAGNOSIS — M25662 Stiffness of left knee, not elsewhere classified: Secondary | ICD-10-CM

## 2019-02-13 DIAGNOSIS — R2689 Other abnormalities of gait and mobility: Secondary | ICD-10-CM

## 2019-02-13 DIAGNOSIS — R600 Localized edema: Secondary | ICD-10-CM

## 2019-02-13 DIAGNOSIS — M25562 Pain in left knee: Secondary | ICD-10-CM

## 2019-02-13 NOTE — Therapy (Signed)
[facility] [address] [place], [place], [address] Phone: [phone]   Fax:  [phone]  Physical Therapy Treatment  Patient Details  Name: Katie Benson MRN: [id number] Date of Birth: [birth year] Referring Provider (PT): Dr. Joni Fears   Encounter Date: 02/13/2019  PT End of Session - 02/13/19 1519    Visit Number  5    Number of Visits  12    Date for PT Re-Evaluation  02/27/19    PT Start Time  [id number]    PT Stop Time  1520    PT Time Calculation (min)  35 min    Activity Tolerance  Patient tolerated treatment well    Behavior During Therapy  [facility] for tasks assessed/performed       Past Medical History:  Diagnosis Date  . Anemia   . Fibroids   . GERD (gastroesophageal reflux disease)   . History of kidney stones   . Neck injury     Past Surgical History:  Procedure Laterality Date  . CESAREAN SECTION    . IR ANGIOGRAM PELVIS SELECTIVE OR SUPRASELECTIVE  03/18/2018  . IR ANGIOGRAM PELVIS SELECTIVE OR SUPRASELECTIVE  03/18/2018  . IR ANGIOGRAM SELECTIVE EACH ADDITIONAL VESSEL  03/18/2018  . IR ANGIOGRAM SELECTIVE EACH ADDITIONAL VESSEL  03/18/2018  . IR EMBO TUMOR ORGAN ISCHEMIA INFARCT INC GUIDE ROADMAPPING  03/18/2018  . IR RADIOLOGIST EVAL & MGMT  10/17/2017  . IR RADIOLOGIST EVAL & MGMT  12/20/2017  . IR RADIOLOGIST EVAL & MGMT  04/17/2018  . IR US GUIDE VASC ACCESS RIGHT  03/18/2018  . KNEE ARTHROSCOPY WITH LATERAL MENISECTOMY Left 12/24/2018   Procedure: LEFT KNEE ARTHROSCOPY WITH LATERAL MENISECTOM, debriment of GANGLION CYST;  Surgeon: [name], MD;  Location: [facility];  Service: Orthopedics;  Laterality: Left;  . PARATHYROIDECTOMY N/A 02/12/2018   Procedure: PARATHYROIDECTOMY;  Surgeon: Fredirick Maudlin, MD;  Location: [facility];  Service: General;  Laterality: N/A;  . WISDOM TOOTH EXTRACTION     age 38    There were no vitals filed for this visit.  Subjective Assessment - 02/13/19 1517    Subjective   still having elevated pain - thinks it's the weather    Limitations  Standing;Walking    How long can you stand comfortably?  10 min    How long can you walk comfortably?  5 min    Patient Stated Goals  improve pain, motion    Currently in Pain?  Yes    Pain Location  Knee    Pain Orientation  Left    Pain Descriptors / Indicators  Aching;Sharp    Pain Type  Acute pain;Surgical pain    Pain Onset  1 to 4 weeks ago    Pain Frequency  Constant    Aggravating Factors   standing, walking, bending    Pain Relieving Factors  meds, ice                       [facility] Adult PT Treatment/Exercise - 02/13/19 1517      Modalities   Modalities  Electrical Stimulation;Moist Heat;Ultrasound      Moist Heat Therapy   Number Minutes Moist Heat  12 Minutes    Moist Heat Location  Knee      Electrical Stimulation   Electrical Stimulation Location  Lt knee    Electrical Stimulation Action  IFC    Electrical Stimulation Parameters  to tolerance    Electrical Stimulation Goals  Pain;Edema  Ultrasound   Ultrasound Location  Lt anterior knee    Ultrasound Parameters  1.0 w/cm2, 100% DC, 45mHz freq x 8 min    Ultrasound Goals  Pain;Edema      Manual Therapy   Soft tissue mobilization  IASTM to Lt knee and ant tib                  PT Long Term Goals - 01/16/19 1355      PT LONG TERM GOAL #1   Title  independent with HEP    Status  New    Target Date  02/27/19      PT LONG TERM GOAL #2   Title  improve Lt knee AROM 0-115 for improved function    Status  New    Target Date  02/27/19      PT LONG TERM GOAL #3   Title  amb without AD independently without deviations for improved function    Status  New    Target Date  02/27/19      PT LONG TERM GOAL #4   Title  report pain < 4/10 with activity for improved pain    Status  New    Target Date  02/27/19      PT LONG TERM GOAL #5   Title  demonstrate at least 4+/5 LLE strength    Status  New    Target Date   02/27/19            Plan - 02/13/19 1520    Clinical Impression Statement  Session today focused on pain control as pain elevated today, with improvement in pain at end of session.  Will continue to benefit from PT. Plans to see MD next week, and recommend she discuss with MD if pain persists.    Examination-Activity Limitations  Stand;Locomotion Level;Squat;Stairs    Examination-Participation Restrictions  Other   occupation   Stability/Clinical Decision Making  Stable/Uncomplicated    Rehab Potential  Good    PT Frequency  2x / week    PT Duration  6 weeks    PT Treatment/Interventions  ADLs/Self Care Home Management;Cryotherapy;Electrical Stimulation;DME Instruction;Ultrasound;Moist Heat;Iontophoresis 4mg /ml Dexamethasone;Gait training;Stair training;Functional mobility training;Therapeutic activities;Therapeutic exercise;Balance training;Patient/family education;Neuromuscular re-education;Manual techniques;Vasopneumatic Device;Taping;Passive range of motion    PT Next Visit Plan  continue with ROM/strength, work on gait without crutch if tolerated    PT Home Exercise Plan  Access Code: [email or web address]    Consulted and Agree with Plan of Care  Patient       Patient will benefit from skilled therapeutic intervention in order to improve the following deficits and impairments:  Abnormal gait, Pain, Decreased mobility, Decreased range of motion, Decreased strength, Increased edema, Difficulty walking  Visit Diagnosis: Acute pain of left knee  Stiffness of left knee, not elsewhere classified  Other abnormalities of gait and mobility  Localized edema     Problem List Patient Active Problem List   Diagnosis Date Noted  . Bucket handle tear of lateral meniscus of left knee 12/12/2018  . Ganglion cyst 12/12/2018  . Pain in left knee 11/15/2018  . Fibroids 03/18/2018  . S/P parathyroidectomy 02/12/2018  . Primary hyperparathyroidism ([place]) 01/08/2018  . Hyperparathyroidism ,  secondary, non-renal ([place]) 01/08/2018  . Fibroid uterus 12/06/2017  . Hypercalcemia 12/06/2017  . Fibroid 10/15/2017  . Abnormal uterine bleeding (AUB) 08/20/2017  . Anemia 08/20/2017      Laureen Abrahams, PT, DPT 02/13/19 3:28 PM    [facility] [id number]  [place], [place], [address] Phone: [phone]   Fax:  [phone]  Name: Katie Benson MRN: [id number] Date of Birth: [birth year]

## 2019-02-18 ENCOUNTER — Encounter: Payer: Self-pay | Admitting: Orthopaedic Surgery

## 2019-02-18 ENCOUNTER — Encounter: Payer: Self-pay | Admitting: Physical Therapy

## 2019-02-18 ENCOUNTER — Other Ambulatory Visit: Payer: Self-pay

## 2019-02-18 ENCOUNTER — Ambulatory Visit (INDEPENDENT_AMBULATORY_CARE_PROVIDER_SITE_OTHER): Payer: Self-pay | Admitting: Physical Therapy

## 2019-02-18 ENCOUNTER — Ambulatory Visit (INDEPENDENT_AMBULATORY_CARE_PROVIDER_SITE_OTHER): Payer: Self-pay | Admitting: Orthopaedic Surgery

## 2019-02-18 VITALS — Wt 154.0 lb

## 2019-02-18 DIAGNOSIS — M25662 Stiffness of left knee, not elsewhere classified: Secondary | ICD-10-CM

## 2019-02-18 DIAGNOSIS — M25562 Pain in left knee: Secondary | ICD-10-CM

## 2019-02-18 DIAGNOSIS — R2689 Other abnormalities of gait and mobility: Secondary | ICD-10-CM

## 2019-02-18 DIAGNOSIS — G8929 Other chronic pain: Secondary | ICD-10-CM

## 2019-02-18 DIAGNOSIS — R600 Localized edema: Secondary | ICD-10-CM

## 2019-02-18 NOTE — Progress Notes (Signed)
Office Visit Note   Patient: Katie Benson           Date of Birth: 10-27-1980           MRN: [id number] Visit Date: 02/18/2019              Requested by: Charlott Rakes, MD [place],  [place] [address] PCP: Charlott Rakes, MD   Assessment & Plan: Visit Diagnoses:  1. Chronic pain of left knee    2 months status post left knee arthroscopy.  There was a bucket-handle tear of the lateral meniscus that could not be repaired.  Partial lateral meniscectomy was performed.  She is progressing but slowly.  Now attending physical therapy.  Has been using 1 crutch rather than 2.  Continue with therapy and exercises plan to see her back in a month.  Her knee looks fine and completely benign.  Hopefully she will return without any ambulatory aid in a month  Follow-Up Instructions: Return in about 1 month (around 03/21/2019).   Orders:  No orders of the defined types were placed in this encounter.  No orders of the defined types were placed in this encounter.     Procedures: No procedures performed   Clinical Data: No additional findings.   Subjective: Chief Complaint  Patient presents with  . Left Knee - Follow-up    Left knee arthroscopy DOS 12/24/2018  Patient presents today for follow up on her left knee. She had a left knee arthroscopy on 12/24/2018. She is now 8 weeks out from surgery. She has been going to therapy twice weekly. She said that she has been hurting for the last 6 days. She has been unable to get her pain medicine due to finances.   HPI  Review of Systems   Objective: Vital Signs: Wt 154 lb (69.9 kg)   BMI 27.28 kg/m   Physical Exam  Ortho Exam left knee was not hot warm red or swollen.  Slight effusion.  Very mild lateral joint pain.  Full quick extension and flex at least 115 degrees without instability.  No popliteal pain no calf pain.  Neurologically intact  Specialty Comments:  No specialty comments  available.  Imaging: No results found.   PMFS History: Patient Active Problem List   Diagnosis Date Noted  . Bucket handle tear of lateral meniscus of left knee 12/12/2018  . Ganglion cyst 12/12/2018  . Pain in left knee 11/15/2018  . Fibroids 03/18/2018  . S/P parathyroidectomy 02/12/2018  . Primary hyperparathyroidism ([place]) 01/08/2018  . Hyperparathyroidism , secondary, non-renal ([place]) 01/08/2018  . Fibroid uterus 12/06/2017  . Hypercalcemia 12/06/2017  . Fibroid 10/15/2017  . Abnormal uterine bleeding (AUB) 08/20/2017  . Anemia 08/20/2017   Past Medical History:  Diagnosis Date  . Anemia   . Fibroids   . GERD (gastroesophageal reflux disease)   . History of kidney stones   . Neck injury     Family History  Problem Relation Age of Onset  . Diabetes Sister   . Cancer Maternal Grandmother        ovarian  . Hypercalcemia Mother     Past Surgical History:  Procedure Laterality Date  . CESAREAN SECTION    . IR ANGIOGRAM PELVIS SELECTIVE OR SUPRASELECTIVE  03/18/2018  . IR ANGIOGRAM PELVIS SELECTIVE OR SUPRASELECTIVE  03/18/2018  . IR ANGIOGRAM SELECTIVE EACH ADDITIONAL VESSEL  03/18/2018  . IR ANGIOGRAM SELECTIVE EACH ADDITIONAL VESSEL  03/18/2018  . IR EMBO TUMOR ORGAN  ISCHEMIA INFARCT INC GUIDE ROADMAPPING  03/18/2018  . IR RADIOLOGIST EVAL & MGMT  10/17/2017  . IR RADIOLOGIST EVAL & MGMT  12/20/2017  . IR RADIOLOGIST EVAL & MGMT  04/17/2018  . IR US GUIDE VASC ACCESS RIGHT  03/18/2018  . KNEE ARTHROSCOPY WITH LATERAL MENISECTOMY Left 12/24/2018   Procedure: LEFT KNEE ARTHROSCOPY WITH LATERAL MENISECTOM, debriment of GANGLION CYST;  Surgeon: [name], MD;  Location: [facility];  Service: Orthopedics;  Laterality: Left;  . PARATHYROIDECTOMY N/A 02/12/2018   Procedure: PARATHYROIDECTOMY;  Surgeon: Fredirick Maudlin, MD;  Location: [facility];  Service: General;  Laterality: N/A;  . WISDOM TOOTH EXTRACTION     age 38   Social History   Occupational History  . Not on  file  Tobacco Use  . Smoking status: Current Every Day Smoker    Packs/day: 0.25    Years: 14.00    Pack years: 3.50    Types: Cigarettes  . Smokeless tobacco: Never Used  Substance and Sexual Activity  . Alcohol use: Yes    Comment: occassionally on weekends  . Drug use: Yes    Types: Marijuana    Comment: last used 12/14/2018-marijuana  . Sexual activity: Yes    Birth control/protection: Condom, None

## 2019-02-18 NOTE — Therapy (Signed)
[facility] [address] [place], [place], [address] Phone: [phone]   Fax:  [phone]  Physical Therapy Treatment  Patient Details  Name: Katie Benson MRN: [id number] Date of Birth: 11-25-1980 Referring Provider (PT): Dr. Joni Fears   Encounter Date: 02/18/2019  PT End of Session - 02/18/19 1439    Visit Number  6    Number of Visits  12    Date for PT Re-Evaluation  02/27/19    PT Start Time  [id number]    PT Stop Time  1425    PT Time Calculation (min)  40 min    Activity Tolerance  Patient tolerated treatment well    Behavior During Therapy  [facility] for tasks assessed/performed       Past Medical History:  Diagnosis Date  . Anemia   . Fibroids   . GERD (gastroesophageal reflux disease)   . History of kidney stones   . Neck injury     Past Surgical History:  Procedure Laterality Date  . CESAREAN SECTION    . IR ANGIOGRAM PELVIS SELECTIVE OR SUPRASELECTIVE  03/18/2018  . IR ANGIOGRAM PELVIS SELECTIVE OR SUPRASELECTIVE  03/18/2018  . IR ANGIOGRAM SELECTIVE EACH ADDITIONAL VESSEL  03/18/2018  . IR ANGIOGRAM SELECTIVE EACH ADDITIONAL VESSEL  03/18/2018  . IR EMBO TUMOR ORGAN ISCHEMIA INFARCT INC GUIDE ROADMAPPING  03/18/2018  . IR RADIOLOGIST EVAL & MGMT  10/17/2017  . IR RADIOLOGIST EVAL & MGMT  12/20/2017  . IR RADIOLOGIST EVAL & MGMT  04/17/2018  . IR US GUIDE VASC ACCESS RIGHT  03/18/2018  . KNEE ARTHROSCOPY WITH LATERAL MENISECTOMY Left 12/24/2018   Procedure: LEFT KNEE ARTHROSCOPY WITH LATERAL MENISECTOM, debriment of GANGLION CYST;  Surgeon: [name], MD;  Location: [facility];  Service: Orthopedics;  Laterality: Left;  . PARATHYROIDECTOMY N/A 02/12/2018   Procedure: PARATHYROIDECTOMY;  Surgeon: Fredirick Maudlin, MD;  Location: [facility];  Service: General;  Laterality: N/A;  . WISDOM TOOTH EXTRACTION     age 38    There were no vitals filed for this visit.  Subjective Assessment - 02/18/19 1416    Subjective   Relays her knee was doing overall better but has had a lot more pain over the last 6 days and had to go back to 2 crutches today vs 1. Saw MD prior to session and she says MD wants her to take it easy for a week and return for F/U in one month.    Limitations  Standing;Walking    How long can you stand comfortably?  10 min    How long can you walk comfortably?  5 min    Patient Stated Goals  improve pain, motion    Currently in Pain?  Yes    Pain Score  8     Pain Location  Knee    Pain Orientation  Left    Pain Descriptors / Indicators  Aching    Pain Type  Surgical pain;Acute pain    Pain Onset  1 to 4 weeks ago                       [facility] Adult PT Treatment/Exercise - 02/18/19 0001      Knee/Hip Exercises: Aerobic   Nustep  attempted but stopped after 2 min due to pain,       Knee/Hip Exercises: Supine   Quad Sets  Left;10 reps      Modalities   Modalities  Electrical Stimulation;Moist Heat;Ultrasound;Vasopneumatic  Theme park manager  Lt knee   10 min   Electrical Stimulation Action  IFC    Electrical Stimulation Parameters  tolerance    Electrical Stimulation Goals  Pain;Edema      Ultrasound   Ultrasound Location  Lt anterior knee    Ultrasound Parameters  1.0 w/cm2, 100%, 1.0 mhz 7 min (dropped down to 50% for last 3 min due to increased sensitivity at inferior knee)    Ultrasound Goals  Pain;Edema      Vasopneumatic   Number Minutes Vasopneumatic   10 minutes    Vasopnuematic Location   Knee    Vasopneumatic Pressure  Medium    Vasopneumatic Temperature   34 deg      Manual Therapy   Soft tissue mobilization  gentle knee PROM, gentle patella mobs, gentle knee distraction mobs in 75 deg of flexion                  PT Long Term Goals - 01/16/19 1355      PT LONG TERM GOAL #1   Title  independent with HEP    Status  New    Target Date  02/27/19      PT LONG TERM GOAL #2   Title  improve  Lt knee AROM 0-115 for improved function    Status  New    Target Date  02/27/19      PT LONG TERM GOAL #3   Title  amb without AD independently without deviations for improved function    Status  New    Target Date  02/27/19      PT LONG TERM GOAL #4   Title  report pain < 4/10 with activity for improved pain    Status  New    Target Date  02/27/19      PT LONG TERM GOAL #5   Title  demonstrate at least 4+/5 LLE strength    Status  New    Target Date  02/27/19            Plan - 02/18/19 1440    Clinical Impression Statement  Pt still having increased pain and swelling in her Lt knee. Unable to tolerate exercises or Nu step so instead focused on modalaties of U.S, TENS, and vasopnuematic to reduce pain and then was treated with gentle PROM and knee mobs to increase ROM. PT will attempt to progress more exercise as able.    Examination-Activity Limitations  Stand;Locomotion Level;Squat;Stairs    Examination-Participation Restrictions  Other   occupation   Stability/Clinical Decision Making  Stable/Uncomplicated    Rehab Potential  Good    PT Frequency  2x / week    PT Duration  6 weeks    PT Treatment/Interventions  ADLs/Self Care Home Management;Cryotherapy;Electrical Stimulation;DME Instruction;Ultrasound;Moist Heat;Iontophoresis 4mg /ml Dexamethasone;Gait training;Stair training;Functional mobility training;Therapeutic activities;Therapeutic exercise;Balance training;Patient/family education;Neuromuscular re-education;Manual techniques;Vasopneumatic Device;Taping;Passive range of motion    PT Next Visit Plan  continue with ROM/strength, work on gait without crutch if tolerated    PT Home Exercise Plan  Access Code: [email or web address]    Consulted and Agree with Plan of Care  Patient       Patient will benefit from skilled therapeutic intervention in order to improve the following deficits and impairments:  Abnormal gait, Pain, Decreased mobility, Decreased range of motion, Decreased  strength, Increased edema, Difficulty walking  Visit Diagnosis: Acute pain of left knee  Stiffness of left knee, not elsewhere classified  Other abnormalities  of gait and mobility  Localized edema     Problem List Patient Active Problem List   Diagnosis Date Noted  . Bucket handle tear of lateral meniscus of left knee 12/12/2018  . Ganglion cyst 12/12/2018  . Pain in left knee 11/15/2018  . Fibroids 03/18/2018  . S/P parathyroidectomy 02/12/2018  . Primary hyperparathyroidism ([place]) 01/08/2018  . Hyperparathyroidism , secondary, non-renal ([place]) 01/08/2018  . Fibroid uterus 12/06/2017  . Hypercalcemia 12/06/2017  . Fibroid 10/15/2017  . Abnormal uterine bleeding (AUB) 08/20/2017  . Anemia 08/20/2017    Silvestre Mesi 02/18/2019, 2:52 PM  [facility] [address] [place], [place], [address] Phone: [phone]   Fax:  [phone]  Name: Katie Benson MRN: [id number] Date of Birth: Jul 25, 1980

## 2019-02-25 ENCOUNTER — Encounter: Payer: Self-pay | Admitting: Physical Therapy

## 2019-02-27 ENCOUNTER — Encounter: Payer: Self-pay | Admitting: Physical Therapy

## 2019-03-21 ENCOUNTER — Other Ambulatory Visit: Payer: Self-pay

## 2019-03-21 ENCOUNTER — Ambulatory Visit (INDEPENDENT_AMBULATORY_CARE_PROVIDER_SITE_OTHER): Payer: Self-pay | Admitting: Physical Therapy

## 2019-03-21 DIAGNOSIS — R2689 Other abnormalities of gait and mobility: Secondary | ICD-10-CM

## 2019-03-21 DIAGNOSIS — M25562 Pain in left knee: Secondary | ICD-10-CM

## 2019-03-21 DIAGNOSIS — M25662 Stiffness of left knee, not elsewhere classified: Secondary | ICD-10-CM

## 2019-03-21 DIAGNOSIS — R600 Localized edema: Secondary | ICD-10-CM

## 2019-03-21 NOTE — Therapy (Signed)
[facility] Physical Therapy [address] [place], [place], [address] Phone: [phone]   Fax:  [phone]  Physical Therapy Treatment/MD progress note/Recert  Patient Details  Name: Katie Benson MRN: [id number] Date of Birth: 01-12-1981 Referring Provider (PT): Dr. Joni Fears   Encounter Date: 03/21/2019  PT End of Session - 03/21/19 1605    Visit Number  8    Number of Visits  12    Date for PT Re-Evaluation  05/02/19    PT Start Time  3734    PT Stop Time  1615    PT Time Calculation (min)  40 min    Activity Tolerance  Patient tolerated treatment well    Behavior During Therapy  [facility] for tasks assessed/performed       Past Medical History:  Diagnosis Date  . Anemia   . Fibroids   . GERD (gastroesophageal reflux disease)   . History of kidney stones   . Neck injury     Past Surgical History:  Procedure Laterality Date  . CESAREAN SECTION    . IR ANGIOGRAM PELVIS SELECTIVE OR SUPRASELECTIVE  03/18/2018  . IR ANGIOGRAM PELVIS SELECTIVE OR SUPRASELECTIVE  03/18/2018  . IR ANGIOGRAM SELECTIVE EACH ADDITIONAL VESSEL  03/18/2018  . IR ANGIOGRAM SELECTIVE EACH ADDITIONAL VESSEL  03/18/2018  . IR EMBO TUMOR ORGAN ISCHEMIA INFARCT INC GUIDE ROADMAPPING  03/18/2018  . IR RADIOLOGIST EVAL & MGMT  10/17/2017  . IR RADIOLOGIST EVAL & MGMT  12/20/2017  . IR RADIOLOGIST EVAL & MGMT  04/17/2018  . IR US GUIDE VASC ACCESS RIGHT  03/18/2018  . KNEE ARTHROSCOPY WITH LATERAL MENISECTOMY Left 12/24/2018   Procedure: LEFT KNEE ARTHROSCOPY WITH LATERAL MENISECTOM, debriment of GANGLION CYST;  Surgeon: [name], MD;  Location: [facility];  Service: Orthopedics;  Laterality: Left;  . PARATHYROIDECTOMY N/A 02/12/2018   Procedure: PARATHYROIDECTOMY;  Surgeon: Fredirick Maudlin, MD;  Location: [facility];  Service: General;  Laterality: N/A;  . WISDOM TOOTH EXTRACTION     age 39    There were no vitals filed for this visit.  Subjective Assessment -  03/21/19 1601    Subjective  She relays her Lt knee is overall better and is not huring much today so she would like to keep it that way and not do any of the exercises because they bother her. She requests to only have modalaties as they really helped with the pain last time. She says she is walking more at home    Limitations  Standing;Walking    How long can you stand comfortably?  10 min    How long can you walk comfortably?  5 min    Patient Stated Goals  improve pain, motion    Currently in Pain?  Yes    Pain Score  3     Pain Location  Knee    Pain Orientation  Left    Pain Descriptors / Indicators  Aching    Pain Onset  1 to 4 weeks ago         [facility] PT Assessment - 03/21/19 0001      Assessment   Medical Diagnosis  S/P left knee arthroscopy with lateral meniscectomy and ganglion cyst debridement.    Referring Provider (PT)  Dr. Joni Fears    Onset Date/Surgical Date  12/24/18    Hand Dominance  Right    Next MD Visit  03/24/18      AROM   Left Knee Extension  -4  Left Knee Flexion  130        [facility] Adult PT Treatment/Exercise - 03/21/19 0001      Ambulation/Gait   Ambulation Distance (Feet)  50 Feet   X2   Assistive device  None    Gait Pattern  Decreased step length - left;Decreased stance time - left;Decreased hip/knee flexion - left    Gait velocity  slower      Knee/Hip Exercises: Stretches   Active Hamstring Stretch  Left;1 rep;30 seconds    Active Hamstring Stretch Limitations  seated      Knee/Hip Exercises: Aerobic   Nustep  --      Knee/Hip Exercises: Supine   Quad Sets  Left;10 reps      Modalities   Modalities  Electrical Stimulation;Moist Heat;Ultrasound;Vasopneumatic      Acupuncturist Location  Lt knee   10 min   Electrical Stimulation Action  IFC    Electrical Stimulation Parameters  tolerance    Electrical Stimulation Goals  Pain;Edema      Ultrasound   Ultrasound Location  Lt anterior knee,  4 min medial, 4 min lateral    Ultrasound Parameters  1.0 w/cm2, 50%, 1.0 mhz    Ultrasound Goals  Pain;Edema      Vasopneumatic   Number Minutes Vasopneumatic   10 minutes    Vasopnuematic Location   Knee    Vasopneumatic Pressure  Medium    Vasopneumatic Temperature   34 deg      Manual Therapy   Soft tissue mobilization  --       PT Education - 03/21/19 1633    Education Details  POC, discussed progress and goals    Person(s) Educated  Patient    Methods  Explanation    Comprehension  Verbalized understanding          PT Long Term Goals - 03/21/19 1627      PT LONG TERM GOAL #1   Title  independent with HEP    Status  On-going    Target Date  05/02/19      PT LONG TERM GOAL #2   Title  improve Lt knee AROM 0-115 for improved function    Baseline  met for flexion, still missing 4 deg of knee ext    Status  Partially Met      PT LONG TERM GOAL #3   Title  amb without AD independently without deviations for improved function    Baseline  can walk without AD for only household distances and has deviations/compsations    Status  Partially Met      PT LONG TERM GOAL #4   Title  report pain < 4/10 with activity for improved pain    Baseline  varries    Status  On-going      PT LONG TERM GOAL #5   Title  demonstrate at least 4+/5 LLE strength    Baseline  did not test today can achieve at least 3/5    Status  Not Met            Plan - 03/21/19 1629    Examination-Activity Limitations  Stand;Locomotion Level;Squat;Stairs    Examination-Participation Restrictions  Other   occupation   Stability/Clinical Decision Making  Stable/Uncomplicated    Rehab Potential  Good    PT Frequency  2x / week    PT Duration  6 weeks    PT Treatment/Interventions  ADLs/Self Care Home Management;Cryotherapy;Electrical Stimulation;DME Instruction;Ultrasound;Moist Heat;Iontophoresis  27m/ml Dexamethasone;Gait training;Stair training;Functional mobility training;Therapeutic  activities;Therapeutic exercise;Balance training;Patient/family education;Neuromuscular re-education;Manual techniques;Vasopneumatic Device;Taping;Passive range of motion    PT Next Visit Plan  continue with ROM/strength when able but she has only wanted to do modalaties the last 2 visits, work on gait without crutch if tolerated    PT Home Exercise Plan  Access Code: [email or web address]    Consulted and Agree with Plan of Care  Patient       Patient will benefit from skilled therapeutic intervention in order to improve the following deficits and impairments:  Abnormal gait, Pain, Decreased mobility, Decreased range of motion, Decreased strength, Increased edema, Difficulty walking  Visit Diagnosis: Acute pain of left knee  Stiffness of left knee, not elsewhere classified  Other abnormalities of gait and mobility  Localized edema     Problem List Patient Active Problem List   Diagnosis Date Noted  . Bucket handle tear of lateral meniscus of left knee 12/12/2018  . Ganglion cyst 12/12/2018  . Pain in left knee 11/15/2018  . Fibroids 03/18/2018  . S/P parathyroidectomy 02/12/2018  . Primary hyperparathyroidism ([place] 01/08/2018  . Hyperparathyroidism , secondary, non-renal ([place] 01/08/2018  . Fibroid uterus 12/06/2017  . Hypercalcemia 12/06/2017  . Fibroid 10/15/2017  . Abnormal uterine bleeding (AUB) 08/20/2017  . Anemia 08/20/2017    BSilvestre Mesi1/22/2021, 4:35 PM  [facility] [address] [address]Phone: [phone]  Fax:  [phone] Name: AMansi TokarMRN: [id number] of Birth: 807/07/1980

## 2019-03-25 ENCOUNTER — Encounter: Payer: Self-pay | Admitting: Orthopaedic Surgery

## 2019-03-25 ENCOUNTER — Ambulatory Visit (INDEPENDENT_AMBULATORY_CARE_PROVIDER_SITE_OTHER): Payer: Self-pay | Admitting: Physical Therapy

## 2019-03-25 ENCOUNTER — Ambulatory Visit (INDEPENDENT_AMBULATORY_CARE_PROVIDER_SITE_OTHER): Payer: Self-pay | Admitting: Orthopaedic Surgery

## 2019-03-25 ENCOUNTER — Other Ambulatory Visit: Payer: Self-pay

## 2019-03-25 VITALS — Ht 63.0 in | Wt 154.8 lb

## 2019-03-25 DIAGNOSIS — M25562 Pain in left knee: Secondary | ICD-10-CM

## 2019-03-25 DIAGNOSIS — R600 Localized edema: Secondary | ICD-10-CM

## 2019-03-25 DIAGNOSIS — M25662 Stiffness of left knee, not elsewhere classified: Secondary | ICD-10-CM

## 2019-03-25 DIAGNOSIS — R2689 Other abnormalities of gait and mobility: Secondary | ICD-10-CM

## 2019-03-25 DIAGNOSIS — G8929 Other chronic pain: Secondary | ICD-10-CM

## 2019-03-25 MED ORDER — HYDROCODONE-ACETAMINOPHEN 5-325 MG PO TABS: 1.0000 | ORAL_TABLET | Freq: Four times a day (QID) | ORAL | 0 refills | Status: DC | PRN

## 2019-03-25 NOTE — Therapy (Signed)
[facility] Physical Therapy [address] [place], [place], [address] Phone: [phone]   Fax:  [phone]  Physical Therapy Treatment  Patient Details  Name: Katie Benson MRN: [id number] Date of Birth: 02/25/1981 Referring Provider (PT): Dr. Joni Fears   Encounter Date: 03/25/2019  PT End of Session - 03/25/19 1514    Visit Number  9    Number of Visits  12    Date for PT Re-Evaluation  05/02/19    PT Start Time  0109    PT Stop Time  1500    PT Time Calculation (min)  45 min    Activity Tolerance  Patient tolerated treatment well    Behavior During Therapy  [facility] for tasks assessed/performed       Past Medical History:  Diagnosis Date  . Anemia   . Fibroids   . GERD (gastroesophageal reflux disease)   . History of kidney stones   . Neck injury     Past Surgical History:  Procedure Laterality Date  . CESAREAN SECTION    . IR ANGIOGRAM PELVIS SELECTIVE OR SUPRASELECTIVE  03/18/2018  . IR ANGIOGRAM PELVIS SELECTIVE OR SUPRASELECTIVE  03/18/2018  . IR ANGIOGRAM SELECTIVE EACH ADDITIONAL VESSEL  03/18/2018  . IR ANGIOGRAM SELECTIVE EACH ADDITIONAL VESSEL  03/18/2018  . IR EMBO TUMOR ORGAN ISCHEMIA INFARCT INC GUIDE ROADMAPPING  03/18/2018  . IR RADIOLOGIST EVAL & MGMT  10/17/2017  . IR RADIOLOGIST EVAL & MGMT  12/20/2017  . IR RADIOLOGIST EVAL & MGMT  04/17/2018  . IR US GUIDE VASC ACCESS RIGHT  03/18/2018  . KNEE ARTHROSCOPY WITH LATERAL MENISECTOMY Left 12/24/2018   Procedure: LEFT KNEE ARTHROSCOPY WITH LATERAL MENISECTOM, debriment of GANGLION CYST;  Surgeon: [name], MD;  Location: [facility];  Service: Orthopedics;  Laterality: Left;  . PARATHYROIDECTOMY N/A 02/12/2018   Procedure: PARATHYROIDECTOMY;  Surgeon: Fredirick Maudlin, MD;  Location: [facility];  Service: General;  Laterality: N/A;  . WISDOM TOOTH EXTRACTION     age 79    There were no vitals filed for this visit.  Subjective Assessment - 03/25/19 1511    Subjective   relays today is not a good day, her knee is hurting more, maybe its the weather, had MD follow up today and got pain med refil along with going to continue to keep her out of work.    Limitations  Standing;Walking    How long can you stand comfortably?  10 min    Currently in Pain?  Yes    Pain Score  6     Pain Location  Knee    Pain Orientation  Left    Pain Descriptors / Indicators  Aching    Pain Type  Surgical pain                       [facility] Adult PT Treatment/Exercise - 03/25/19 0001      Knee/Hip Exercises: Stretches   Active Hamstring Stretch  Left;1 rep;30 seconds    Active Hamstring Stretch Limitations  seated      Knee/Hip Exercises: Supine   Quad Sets  Left;10 reps    Heel Slides  AAROM;Left;5 reps    Straight Leg Raises  Left;5 reps      Modalities   Modalities  Electrical Stimulation;Moist Heat;Ultrasound;Vasopneumatic      Acupuncturist Location  Lt knee   15 min   Electrical Stimulation Action  IFC    Electrical Stimulation  Parameters  tolerance    Electrical Stimulation Goals  Pain      Ultrasound   Ultrasound Location  Lt anterior and medial knee    Ultrasound Parameters  1.o 0W/cm2, 50%, 1.0 mhz X 8 min    Ultrasound Goals  Pain;Edema      Vasopneumatic   Number Minutes Vasopneumatic   15 minutes    Vasopnuematic Location   Knee    Vasopneumatic Pressure  Low    Vasopneumatic Temperature   34 deg      Manual Therapy   Manual therapy comments  KT tape for edema                  PT Long Term Goals - 03/21/19 1627      PT LONG TERM GOAL #1   Title  independent with HEP    Status  On-going    Target Date  05/02/19      PT LONG TERM GOAL #2   Title  improve Lt knee AROM 0-115 for improved function    Baseline  met for flexion, still missing 4 deg of knee ext    Status  Partially Met      PT LONG TERM GOAL #3   Title  amb without AD independently without deviations for improved  function    Baseline  can walk without AD for only household distances and has deviations/compsations    Status  Partially Met      PT LONG TERM GOAL #4   Title  report pain < 4/10 with activity for improved pain    Baseline  varries    Status  On-going      PT LONG TERM GOAL #5   Title  demonstrate at least 4+/5 LLE strength    Baseline  did not test today can achieve at least 3/5    Status  Not Met            Plan - 03/25/19 1514    Clinical Impression Statement  She had more pain today and requests to continue with modalaties for pain control. Had mild edema so continued with vaso and added KT tape for edema. PT will assess her response to this next sesison. She continues to lack overall activity tolerance but was able to perform two exericses today with fair tolerance.    Examination-Activity Limitations  Stand;Locomotion Level;Squat;Stairs    Examination-Participation Restrictions  Other   occupation   Stability/Clinical Decision Making  Stable/Uncomplicated    Rehab Potential  Good    PT Frequency  2x / week    PT Duration  6 weeks    PT Treatment/Interventions  ADLs/Self Care Home Management;Cryotherapy;Electrical Stimulation;DME Instruction;Ultrasound;Moist Heat;Iontophoresis 4m/ml Dexamethasone;Gait training;Stair training;Functional mobility training;Therapeutic activities;Therapeutic exercise;Balance training;Patient/family education;Neuromuscular re-education;Manual techniques;Vasopneumatic Device;Taping;Passive range of motion    PT Next Visit Plan  continue with ROM/strength when able but she has only wanted to do modalaties the last 2 visits, work on gait without crutch if tolerated    PT Home Exercise Plan  Access Code: [email or web address]    Consulted and Agree with Plan of Care  Patient       Patient will benefit from skilled therapeutic intervention in order to improve the following deficits and impairments:  Abnormal gait, Pain, Decreased mobility, Decreased range of  motion, Decreased strength, Increased edema, Difficulty walking  Visit Diagnosis: Acute pain of left knee  Stiffness of left knee, not elsewhere classified  Other abnormalities of gait and mobility  Localized edema  Problem List Patient Active Problem List   Diagnosis Date Noted  . Bucket handle tear of lateral meniscus of left knee 12/12/2018  . Ganglion cyst 12/12/2018  . Pain in left knee 11/15/2018  . Fibroids 03/18/2018  . S/P parathyroidectomy 02/12/2018  . Primary hyperparathyroidism ([place]) 01/08/2018  . Hyperparathyroidism , secondary, non-renal ([place]) 01/08/2018  . Fibroid uterus 12/06/2017  . Hypercalcemia 12/06/2017  . Fibroid 10/15/2017  . Abnormal uterine bleeding (AUB) 08/20/2017  . Anemia 08/20/2017    Silvestre Mesi 03/25/2019, 4:00 PM  [facility] Physical Therapy [address] [place], [place], [address] Phone: [phone]   Fax:  [phone]  Name: Tyara Dassow MRN: [id number] Date of Birth: 1980/07/02

## 2019-03-25 NOTE — Progress Notes (Signed)
Office Visit Note   Patient: Katie Benson           Date of Birth: December 16, 1980           MRN: [id number] Visit Date: 03/25/2019              Requested by: Charlott Rakes, MD [place],  [place] [address] PCP: Charlott Rakes, MD   Assessment & Plan: Visit Diagnoses:  1. Chronic pain of left knee     Plan: 3 months status post left knee arthroscopy there was a complex tear of the lateral meniscus that was debrided.  It was not repairable.  Feeling better with slow yet steady progression.  Only uses crutches when she is tired at night.  Going to physical therapy and working with home exercises.  Will renew Norco and explained her to take it as infrequently as possible.  Also needs a note for work saying that she is unable to work at this point and we will reevaluate her work status on a monthly basis. 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   Orders:  No orders of the defined types were placed in this encounter.  Meds ordered this encounter  Medications  . HYDROcodone-acetaminophen (NORCO/VICODIN) 5-325 MG tablet    Sig: Take 1 tablet by mouth every 6 (six) hours as needed for moderate pain.    Dispense:  30 tablet    Refill:  0      Procedures: No procedures performed   Clinical Data: No additional findings.   Subjective: Chief Complaint  Patient presents with  . Left Knee - Follow-up    Left knee scope DOS 12/24/2018  Patient presents today for follow up on her left knee. She had a left knee arthroscopy on 12/24/2018. She is about 3 months out from surgery. She just started back to physical therapy last week. She is taking Hydrocodone but states that she needs something stronger to  take after therapy and at night. She has a difficult time sleeping due to pain. Her swelling has decreased.  However, definitely making progress with less use of the crutches and with less swelling and pain  HPI  Review of Systems   Objective: Vital Signs: Ht 5\' 3"  (1.6 m)   Wt 154 lb 12.8 oz (70.2 kg)   BMI 27.42 kg/m   Physical Exam  Ortho Exam left knee arthroscopic portals of healed without a problem.  No effusion.  Full quick extension of flexed over 120 degrees without instability.  A little bit of lateral joint pain no distal edema.  Neurologically intact.  Specialty Comments:  No specialty comments available.  Imaging: No results found.   PMFS History: Patient Active Problem List   Diagnosis Date Noted  . Bucket handle tear of lateral meniscus of left knee 12/12/2018  . Ganglion cyst 12/12/2018  . Pain in left knee 11/15/2018  . Fibroids 03/18/2018  . S/P parathyroidectomy 02/12/2018  . Primary hyperparathyroidism ([place]) 01/08/2018  . Hyperparathyroidism , secondary, non-renal ([place]) 01/08/2018  . Fibroid uterus 12/06/2017  . Hypercalcemia 12/06/2017  . Fibroid 10/15/2017  . Abnormal uterine bleeding (AUB) 08/20/2017  . Anemia 08/20/2017   Past Medical History:  Diagnosis Date  . Anemia   .  Fibroids   . GERD (gastroesophageal reflux disease)   . History of kidney stones   . Neck injury     Family History  Problem Relation Age of Onset  . Diabetes Sister   . Cancer Maternal Grandmother        ovarian  . Hypercalcemia Mother     Past Surgical History:  Procedure Laterality Date  . CESAREAN SECTION    . IR ANGIOGRAM PELVIS SELECTIVE OR SUPRASELECTIVE  03/18/2018  . IR ANGIOGRAM PELVIS SELECTIVE OR SUPRASELECTIVE  03/18/2018  . IR ANGIOGRAM SELECTIVE EACH ADDITIONAL VESSEL  03/18/2018  . IR ANGIOGRAM SELECTIVE EACH ADDITIONAL VESSEL  03/18/2018  . IR EMBO TUMOR ORGAN ISCHEMIA INFARCT INC GUIDE ROADMAPPING  03/18/2018  . IR RADIOLOGIST EVAL & MGMT  10/17/2017   . IR RADIOLOGIST EVAL & MGMT  12/20/2017  . IR RADIOLOGIST EVAL & MGMT  04/17/2018  . IR US GUIDE VASC ACCESS RIGHT  03/18/2018  . KNEE ARTHROSCOPY WITH LATERAL MENISECTOMY Left 12/24/2018   Procedure: LEFT KNEE ARTHROSCOPY WITH LATERAL MENISECTOM, debriment of GANGLION CYST;  Surgeon: [name], MD;  Location: [facility];  Service: Orthopedics;  Laterality: Left;  . PARATHYROIDECTOMY N/A 02/12/2018   Procedure: PARATHYROIDECTOMY;  Surgeon: Fredirick Maudlin, MD;  Location: [facility];  Service: General;  Laterality: N/A;  . WISDOM TOOTH EXTRACTION     age 39   Social History   Occupational History  . Not on file  Tobacco Use  . Smoking status: Current Every Day Smoker    Packs/day: 0.25    Years: 14.00    Pack years: 3.50    Types: Cigarettes  . Smokeless tobacco: Never Used  Substance and Sexual Activity  . Alcohol use: Yes    Comment: occassionally on weekends  . Drug use: Yes    Types: Marijuana    Comment: last used 12/14/2018-marijuana  . Sexual activity: Yes    Birth control/protection: Condom, None

## 2019-04-03 ENCOUNTER — Encounter: Payer: Self-pay | Admitting: Physical Therapy

## 2019-04-03 ENCOUNTER — Telehealth: Payer: Self-pay | Admitting: Physical Therapy

## 2019-04-03 NOTE — Telephone Encounter (Signed)
Spoke with pt about no show for appt, she had her days mixed up.  Helped get scheduled for 2x/wk next week and given next appt time.    Laureen Abrahams, PT, DPT 04/03/19 11:29 AM

## 2019-04-08 ENCOUNTER — Encounter: Payer: Self-pay | Admitting: Physical Therapy

## 2019-04-08 ENCOUNTER — Ambulatory Visit (INDEPENDENT_AMBULATORY_CARE_PROVIDER_SITE_OTHER): Payer: Self-pay | Admitting: Physical Therapy

## 2019-04-08 ENCOUNTER — Other Ambulatory Visit: Payer: Self-pay | Admitting: Orthopaedic Surgery

## 2019-04-08 ENCOUNTER — Telehealth: Payer: Self-pay | Admitting: Physical Therapy

## 2019-04-08 ENCOUNTER — Other Ambulatory Visit: Payer: Self-pay

## 2019-04-08 DIAGNOSIS — M25562 Pain in left knee: Secondary | ICD-10-CM

## 2019-04-08 DIAGNOSIS — M25662 Stiffness of left knee, not elsewhere classified: Secondary | ICD-10-CM

## 2019-04-08 DIAGNOSIS — R2689 Other abnormalities of gait and mobility: Secondary | ICD-10-CM

## 2019-04-08 DIAGNOSIS — R600 Localized edema: Secondary | ICD-10-CM

## 2019-04-08 MED ORDER — HYDROCODONE-ACETAMINOPHEN 5-325 MG PO TABS: 1.0000 | ORAL_TABLET | Freq: Four times a day (QID) | ORAL | 0 refills | Status: DC | PRN

## 2019-04-08 NOTE — Telephone Encounter (Signed)
Will send in Rx for hydrocodone-OK for handicap placard
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
## 2019-04-08 NOTE — Telephone Encounter (Signed)
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  Let me know what you think.  I can come by and get the order from you if you're willing to write one.  She's still having some intermittent swelling and is using a single crutch at times for ambulation.  Therapy sessions are limited due to fear avoidance and pain with most activities.  Thanks- Laureen Abrahams, PT, DPT 04/08/19 2:27 PM

## 2019-04-09 ENCOUNTER — Telehealth: Payer: Self-pay | Admitting: Orthopaedic Surgery

## 2019-04-09 NOTE — Telephone Encounter (Signed)
Patient called stated her Rx was sent to [facility] and they do not fill opioids.  Wants Rx called to [facility] on [address].  Please call patient to advise.  [phone]

## 2019-04-10 ENCOUNTER — Other Ambulatory Visit: Payer: Self-pay | Admitting: Orthopaedic Surgery

## 2019-04-10 MED ORDER — HYDROCODONE-ACETAMINOPHEN 5-325 MG PO TABS: 1.0000 | ORAL_TABLET | Freq: Four times a day (QID) | ORAL | 0 refills | Status: DC | PRN

## 2019-04-10 NOTE — Telephone Encounter (Signed)
Called patient. No answer. Left message that medicine has been sent to other pharmacy.

## 2019-04-10 NOTE — Telephone Encounter (Signed)
See below

## 2019-04-10 NOTE — Telephone Encounter (Signed)
sent 

## 2019-04-11 ENCOUNTER — Encounter: Payer: Self-pay | Admitting: Physical Therapy

## 2019-04-14 ENCOUNTER — Ambulatory Visit (INDEPENDENT_AMBULATORY_CARE_PROVIDER_SITE_OTHER): Payer: Self-pay | Admitting: Physical Therapy

## 2019-04-14 ENCOUNTER — Other Ambulatory Visit: Payer: Self-pay

## 2019-04-14 ENCOUNTER — Encounter: Payer: Self-pay | Admitting: Physical Therapy

## 2019-04-14 DIAGNOSIS — M25662 Stiffness of left knee, not elsewhere classified: Secondary | ICD-10-CM

## 2019-04-14 DIAGNOSIS — R600 Localized edema: Secondary | ICD-10-CM

## 2019-04-14 DIAGNOSIS — M25562 Pain in left knee: Secondary | ICD-10-CM

## 2019-04-14 DIAGNOSIS — R2689 Other abnormalities of gait and mobility: Secondary | ICD-10-CM

## 2019-04-14 NOTE — Therapy (Signed)
[facility] [address] [place], [place], [address] Phone: [phone]   Fax:  [phone]  Physical Therapy Treatment  Patient Details  Name: Katie Benson MRN: [id number] Date of Birth: December 17, 1980 Referring Provider (PT): Dr. Joni Fears   Encounter Date: 04/14/2019  PT End of Session - 04/14/19 1522    Visit Number  11    Number of Visits  12    Date for PT Re-Evaluation  05/02/19    PT Start Time  1442    PT Stop Time  1520    PT Time Calculation (min)  38 min    Activity Tolerance  Patient tolerated treatment well    Behavior During Therapy  [facility] for tasks assessed/performed       Past Medical History:  Diagnosis Date  . Anemia   . Fibroids   . GERD (gastroesophageal reflux disease)   . History of kidney stones   . Neck injury     Past Surgical History:  Procedure Laterality Date  . CESAREAN SECTION    . IR ANGIOGRAM PELVIS SELECTIVE OR SUPRASELECTIVE  03/18/2018  . IR ANGIOGRAM PELVIS SELECTIVE OR SUPRASELECTIVE  03/18/2018  . IR ANGIOGRAM SELECTIVE EACH ADDITIONAL VESSEL  03/18/2018  . IR ANGIOGRAM SELECTIVE EACH ADDITIONAL VESSEL  03/18/2018  . IR EMBO TUMOR ORGAN ISCHEMIA INFARCT INC GUIDE ROADMAPPING  03/18/2018  . IR RADIOLOGIST EVAL & MGMT  10/17/2017  . IR RADIOLOGIST EVAL & MGMT  12/20/2017  . IR RADIOLOGIST EVAL & MGMT  04/17/2018  . IR US GUIDE VASC ACCESS RIGHT  03/18/2018  . KNEE ARTHROSCOPY WITH LATERAL MENISECTOMY Left 12/24/2018   Procedure: LEFT KNEE ARTHROSCOPY WITH LATERAL MENISECTOM, debriment of GANGLION CYST;  Surgeon: [name], MD;  Location: [facility];  Service: Orthopedics;  Laterality: Left;  . PARATHYROIDECTOMY N/A 02/12/2018   Procedure: PARATHYROIDECTOMY;  Surgeon: Fredirick Maudlin, MD;  Location: [facility];  Service: General;  Laterality: N/A;  . WISDOM TOOTH EXTRACTION     age 39    There were no vitals filed for this visit.  Subjective Assessment - 04/14/19 1446    Subjective   she's doing more at home.  still having pain and swelling.    Limitations  Standing;Walking    How long can you stand comfortably?  10 min    Patient Stated Goals  improve pain, motion    Pain Score  6    "it really doesn't hurt"   Pain Location  Knee    Pain Orientation  Left    Pain Descriptors / Indicators  Aching    Pain Type  Surgical pain    Pain Onset  1 to 4 weeks ago    Pain Frequency  Constant    Aggravating Factors   standing, walking, bending    Pain Relieving Factors  meds, ice                       [facility] Adult PT Treatment/Exercise - 04/14/19 1450      Knee/Hip Exercises: Standing   Other Standing Knee Exercises  weight shifting and gait without crutch - cues for heel strike      Knee/Hip Exercises: Seated   Long Arc Quad  Left;20 reps    Long Arc Quad Weight  --   1.5   Hamstring Curl  Left;20 reps    Hamstring Limitations  green theraband      Ultrasound   Ultrasound Location  Lt anterior and lateral  knee    Ultrasound Parameters  1.0 w/cm2, 50%, 1.0 mhz x 8 min    Ultrasound Goals  Pain;Edema      Manual Therapy   Manual therapy comments  KT tape for edema to Lt lateral knee; 30% stretch lateral vertical anchor with horizontal split strip on 30% stretch from lateral to medial; split around patella                  PT Long Term Goals - 03/21/19 1627      PT LONG TERM GOAL #1   Title  independent with HEP    Status  On-going    Target Date  05/02/19      PT LONG TERM GOAL #2   Title  improve Lt knee AROM 0-115 for improved function    Baseline  met for flexion, still missing 4 deg of knee ext    Status  Partially Met      PT LONG TERM GOAL #3   Title  amb without AD independently without deviations for improved function    Baseline  can walk without AD for only household distances and has deviations/compsations    Status  Partially Met      PT LONG TERM GOAL #4   Title  report pain < 4/10 with activity for improved pain     Baseline  varries    Status  On-going      PT LONG TERM GOAL #5   Title  demonstrate at least 4+/5 LLE strength    Baseline  did not test today can achieve at least 3/5    Status  Not Met            Plan - 04/14/19 1522    Clinical Impression Statement  Pt with improved pain today and able to tolerate light exercise today.  Still with swelling so U/S and taping performed again today.  Would like to continue to progress exercises as able to work on function and strength.    Examination-Activity Limitations  Stand;Locomotion Level;Squat;Stairs    Examination-Participation Restrictions  Other   occupation   Stability/Clinical Decision Making  Stable/Uncomplicated    Rehab Potential  Good    PT Frequency  2x / week    PT Duration  6 weeks    PT Treatment/Interventions  ADLs/Self Care Home Management;Cryotherapy;Electrical Stimulation;DME Instruction;Ultrasound;Moist Heat;Iontophoresis 60m/ml Dexamethasone;Gait training;Stair training;Functional mobility training;Therapeutic activities;Therapeutic exercise;Balance training;Patient/family education;Neuromuscular re-education;Manual techniques;Vasopneumatic Device;Taping;Passive range of motion    PT Next Visit Plan  continue with strengthening, gait without crutches, manual/modalities PRN    PT Home Exercise Plan  Access Code: [email or web address]    Consulted and Agree with Plan of Care  Patient       Patient will benefit from skilled therapeutic intervention in order to improve the following deficits and impairments:  Abnormal gait, Pain, Decreased mobility, Decreased range of motion, Decreased strength, Increased edema, Difficulty walking  Visit Diagnosis: Acute pain of left knee  Stiffness of left knee, not elsewhere classified  Other abnormalities of gait and mobility  Localized edema     Problem List Patient Active Problem List   Diagnosis Date Noted  . Bucket handle tear of lateral meniscus of left knee 12/12/2018  .  Ganglion cyst 12/12/2018  . Pain in left knee 11/15/2018  . Fibroids 03/18/2018  . S/P parathyroidectomy 02/12/2018  . [place]roidism ([place] 01/08/2018  . [place]par[place] , [place], [place]al [place] 01/08/2018  . Fibroid uterus 12/06/2017  . Hypercalcemia 12/06/2017  . Fibroid  10/15/2017  . Abnormal uterine bleeding (AUB) 08/20/2017  . Anemia 08/20/2017      Laureen Abrahams, PT, DPT 04/14/19 3:24 PM     [facility] [address] [place], [place], [address] Phone: [phone]   Fax:  [phone]  Name: Katie Benson MRN: [id number] Date of Birth: 07-Sep-1980

## 2019-04-17 ENCOUNTER — Encounter: Payer: Self-pay | Admitting: Physical Therapy

## 2019-04-21 ENCOUNTER — Other Ambulatory Visit: Payer: Self-pay

## 2019-04-21 ENCOUNTER — Encounter: Payer: Self-pay | Admitting: Physical Therapy

## 2019-04-21 ENCOUNTER — Ambulatory Visit (INDEPENDENT_AMBULATORY_CARE_PROVIDER_SITE_OTHER): Payer: Self-pay | Admitting: Physical Therapy

## 2019-04-21 DIAGNOSIS — M25562 Pain in left knee: Secondary | ICD-10-CM

## 2019-04-21 DIAGNOSIS — M25662 Stiffness of left knee, not elsewhere classified: Secondary | ICD-10-CM

## 2019-04-21 DIAGNOSIS — R600 Localized edema: Secondary | ICD-10-CM

## 2019-04-21 DIAGNOSIS — R2689 Other abnormalities of gait and mobility: Secondary | ICD-10-CM

## 2019-04-21 NOTE — Therapy (Signed)
[facility] [address] [place], [place], [address] Phone: [phone]   Fax:  [phone]  Physical Therapy Treatment/Recertification  Patient Details  Name: Katie Benson MRN: [id number] Date of Birth: Jul 30, 1980 Referring Provider (PT): Dr. Joni Fears   Encounter Date: 04/21/2019  PT End of Session - 04/21/19 1256    Visit Number  12    Number of Visits  20    Date for PT Re-Evaluation  06/16/19    PT Start Time  1146    PT Stop Time  1228    PT Time Calculation (min)  42 min    Activity Tolerance  Patient tolerated treatment well    Behavior During Therapy  [facility] for tasks assessed/performed       Past Medical History:  Diagnosis Date  . Anemia   . Fibroids   . GERD (gastroesophageal reflux disease)   . History of kidney stones   . Neck injury     Past Surgical History:  Procedure Laterality Date  . CESAREAN SECTION    . IR ANGIOGRAM PELVIS SELECTIVE OR SUPRASELECTIVE  03/18/2018  . IR ANGIOGRAM PELVIS SELECTIVE OR SUPRASELECTIVE  03/18/2018  . IR ANGIOGRAM SELECTIVE EACH ADDITIONAL VESSEL  03/18/2018  . IR ANGIOGRAM SELECTIVE EACH ADDITIONAL VESSEL  03/18/2018  . IR EMBO TUMOR ORGAN ISCHEMIA INFARCT INC GUIDE ROADMAPPING  03/18/2018  . IR RADIOLOGIST EVAL & MGMT  10/17/2017  . IR RADIOLOGIST EVAL & MGMT  12/20/2017  . IR RADIOLOGIST EVAL & MGMT  04/17/2018  . IR US GUIDE VASC ACCESS RIGHT  03/18/2018  . KNEE ARTHROSCOPY WITH LATERAL MENISECTOMY Left 12/24/2018   Procedure: LEFT KNEE ARTHROSCOPY WITH LATERAL MENISECTOM, debriment of GANGLION CYST;  Surgeon: [name], MD;  Location: [facility];  Service: Orthopedics;  Laterality: Left;  . PARATHYROIDECTOMY N/A 02/12/2018   Procedure: PARATHYROIDECTOMY;  Surgeon: Fredirick Maudlin, MD;  Location: [facility];  Service: General;  Laterality: N/A;  . WISDOM TOOTH EXTRACTION     age 39    There were no vitals filed for this visit.  Subjective Assessment - 04/21/19 1148     Subjective  "today is not a good day."  reports knee is throbbing; attributes it to weather.    Limitations  Standing;Walking    How long can you stand comfortably?  10 min    Patient Stated Goals  improve pain, motion    Currently in Pain?  Yes    Pain Score  6     Pain Location  Knee    Pain Orientation  Left    Pain Descriptors / Indicators  Throbbing    Pain Onset  1 to 4 weeks ago         [facility] PT Assessment - 04/21/19 1151      Assessment   Medical Diagnosis  S/P left knee arthroscopy with lateral meniscectomy and ganglion cyst debridement.    Referring Provider (PT)  Dr. Joni Fears    Onset Date/Surgical Date  12/24/18      AROM   Left Knee Extension  0    Left Knee Flexion  120      Strength   Strength Assessment Site  Knee    Right/Left Knee  Left    Left Knee Flexion  4/5    Left Knee Extension  3+/5                   [facility] Adult PT Treatment/Exercise - 04/21/19 1205  Knee/Hip Exercises: Seated   Long Arc Quad  Left;20 reps    Long Arc Quad [employer]  --   no weight today   Ball Squeeze  20x 5 sec    Hamstring Curl  Left;20 reps    Hamstring Limitations  L1 band      Manual Therapy   Manual therapy comments  KT tape for edema to Lt lateral knee; 30% stretch lateral vertical anchor with horizontal split strip on 30% stretch from lateral to medial; split around patella                  PT Long Term Goals - 04/21/19 1257      PT LONG TERM GOAL #1   Title  independent with HEP    Baseline  2/22: progressing - still working on pain control to progress HEP    Status  On-going    Target Date  05/19/19      PT LONG TERM GOAL #2   Title  improve Lt knee AROM 0-115 for improved function    Baseline  met for flexion, still missing 4 deg of knee ext    Status  Achieved      PT LONG TERM GOAL #3   Title  amb without AD independently without deviations for improved function    Baseline  2/22: can walk without AD for only  household distances and has deviations/compensations; still using single crutch for community ambulation    Status  On-going    Target Date  05/19/19      PT LONG TERM GOAL #4   Title  report pain < 4/10 with activity for improved pain    Baseline  2/22: 6/10; still variable    Status  On-going    Target Date  05/19/19      PT LONG TERM GOAL #5   Title  demonstrate at least 4+/5 LLE strength    Baseline  did not test today can achieve at least 3/5    Status  On-going    Target Date  05/19/19            Plan - 04/21/19 1258    Clinical Impression Statement  Pt has met ROM goal at this time, and all other LTGs are ongoing.  Progress has been limited due to continued pain and swelling.  She has been able to tolerate some light open chain exercises past few sessions without increase in pain, so hopeful to be able to progress these.  Overall slowly progressing and recommend continued OPPT 2x/wk x 4 wks to progress mobility and strength.    Examination-Activity Limitations  Stand;Locomotion Level;Squat;Stairs    Examination-Participation Restrictions  Other   occupation   Stability/Clinical Decision Making  Stable/Uncomplicated    Rehab Potential  Good    PT Frequency  2x / week    PT Duration  6 weeks    PT Treatment/Interventions  ADLs/Self Care Home Management;Cryotherapy;Electrical Stimulation;DME Instruction;Ultrasound;Moist Heat;Iontophoresis 66m/ml Dexamethasone;Gait training;Stair training;Functional mobility training;Therapeutic activities;Therapeutic exercise;Balance training;Patient/family education;Neuromuscular re-education;Manual techniques;Vasopneumatic Device;Taping;Passive range of motion    PT Next Visit Plan  continue with strengthening, gait without crutches, manual/modalities PRN    PT Home Exercise Plan  Access Code: [email or web address]    Consulted and Agree with Plan of Care  Patient       Patient will benefit from skilled therapeutic intervention in order to improve the  following deficits and impairments:  Abnormal gait, Pain, Decreased mobility, Decreased range of motion, Decreased strength, Increased edema,  Difficulty walking  Visit Diagnosis: Acute pain of left knee - Plan: PT plan of care cert/re-cert  Stiffness of left knee, not elsewhere classified - Plan: PT plan of care cert/re-cert  Other abnormalities of gait and mobility - Plan: PT plan of care cert/re-cert  Localized edema - Plan: PT plan of care cert/re-cert     Problem List Patient Active Problem List   Diagnosis Date Noted  . Bucket handle tear of lateral meniscus of left knee 12/12/2018  . Ganglion cyst 12/12/2018  . Pain in left knee 11/15/2018  . Fibroids 03/18/2018  . S/P parathyroidectomy 02/12/2018  . Primary hyperparathyroidism ([place]) 01/08/2018  . Hyperparathyroidism , secondary, non-renal ([place]) 01/08/2018  . Fibroid uterus 12/06/2017  . Hypercalcemia 12/06/2017  . Fibroid 10/15/2017  . Abnormal uterine bleeding (AUB) 08/20/2017  . Anemia 08/20/2017      Laureen Abrahams, PT, DPT 04/21/19 1:03 PM    [facility] [address] [place], [place], [address] Phone: [phone]   Fax:  [phone]  Name: Katie Benson MRN: [id number] Date of Birth: 11-04-1980

## 2019-04-29 ENCOUNTER — Other Ambulatory Visit: Payer: Self-pay

## 2019-04-29 ENCOUNTER — Encounter: Payer: Self-pay | Admitting: Orthopaedic Surgery

## 2019-04-29 ENCOUNTER — Encounter: Payer: Self-pay | Admitting: Physical Therapy

## 2019-04-29 ENCOUNTER — Ambulatory Visit (INDEPENDENT_AMBULATORY_CARE_PROVIDER_SITE_OTHER): Payer: Self-pay | Admitting: Orthopaedic Surgery

## 2019-04-29 VITALS — BP 128/74 | HR 101 | Ht 63.0 in | Wt 161.0 lb

## 2019-04-29 DIAGNOSIS — M7050 Other bursitis of knee, unspecified knee: Secondary | ICD-10-CM | POA: Insufficient documentation

## 2019-04-29 DIAGNOSIS — S83252D Bucket-handle tear of lateral meniscus, current injury, left knee, subsequent encounter: Secondary | ICD-10-CM

## 2019-04-29 MED ORDER — METHYLPREDNISOLONE ACETATE 40 MG/ML IJ SUSP
30.0000 mg | INTRAMUSCULAR | Status: AC | PRN
  Administered 2019-04-29: 15:00:00 30 mg via INTRA_ARTICULAR

## 2019-04-29 MED ORDER — BUPIVACAINE HCL 0.25 % IJ SOLN
0.6600 mL | INTRAMUSCULAR | Status: AC | PRN
  Administered 2019-04-29: 15:00:00 .66 mL via INTRA_ARTICULAR

## 2019-04-29 MED ORDER — LIDOCAINE HCL 1 % IJ SOLN
1.0000 mL | INTRAMUSCULAR | Status: AC | PRN
  Administered 2019-04-29: 1 mL

## 2019-04-29 MED ORDER — TRAZODONE HCL 50 MG PO TABS: 50.0000 mg | ORAL_TABLET | Freq: Every day | ORAL | 1 refills | Status: DC

## 2019-04-29 NOTE — Progress Notes (Signed)
Office Visit Note   Patient: Katie Benson           Date of Birth: 26-Jun-1980           MRN: [id number] Visit Date: 04/29/2019              Requested by: Charlott Rakes, MD [place],  [place] [address] PCP: Charlott Rakes, MD   Assessment & Plan: Visit Diagnoses:  1. Bucket handle tear of lateral meniscus of left knee, unspecified whether old or current tear, subsequent encounter   2. Pes anserine bursitis     Plan:  #1: Corticosteroid injection was given to the pedis anserine bursa of the left knee.  She did have decreased pain post injection. #2: She will use Voltaren gel to the knee at least 3 times a day. #3: She will hold her physical therapy for today but I told her that I will have her back in physical therapy as soon as possible #4: Trazodone was prescribed since this has been beneficial with her sleep.  Also if there is some RSD factor here the trazodone would be of benefit for this.  Follow-Up Instructions: Return in about 4 weeks (around 05/27/2019).   Orders:  No orders of the defined types were placed in this encounter.  Meds ordered this encounter  Medications  . traZODone (DESYREL) 50 MG tablet    Sig: Take 1 tablet (50 mg total) by mouth at bedtime.    Dispense:  30 tablet    Refill:  1    Order Specific Question:   Supervising Provider    Answer:   [name] [[id number]]      Procedures: Large Joint Inj: L knee on 04/29/2019 3:07 PM Indications: pain and diagnostic evaluation Details: 25 G 1.5 in needle, anteromedial approach  Arthrogram: No  Medications: 1 mL lidocaine 1 %; 0.66 mL bupivacaine 0.25 %; 30 mg methylPREDNISolone acetate 40 MG/ML Outcome: tolerated well, no immediate complications  Area injected was the pes anserine bursa Procedure, treatment alternatives, risks and benefits explained, specific risks discussed. Consent was given by the patient. Immediately prior to procedure a time out was called to verify  the correct patient, procedure, equipment, support staff and site/side marked as required. Patient was prepped and draped in the usual sterile fashion.       Clinical Data: No additional findings.   Subjective: Chief Complaint  Patient presents with  . Left Knee - Follow-up    Left knee arthroscopy DOS 12/24/2018  Patient presents today for a 5 week follow up on her left knee. She had a left knee arthroscopy on 12/24/2018. She has been going to physical therapy twice weekly. She said that she is doing well except for prolonged walks. She said that she has been taking the hydrocodone for therapy and states that it stays swollen and painful after therapy. She has ran out of pain medicine. She is walking with one crutch. She also wants to discuss her skin discoloration. She aching in the back side of both her legs at night.   HPI  Her main complaints are along the proximal tibial area medially.  Also she is having some swelling in the left knee intra-articular.  She has been using trazodone and states that she gets good rest at nighttime.  Otherwise she has to take a lot of narcotics to get her comfortable.  Review of Systems  Constitutional: Positive for fatigue.  HENT: Negative for ear pain.  Eyes: Negative for pain.  Respiratory: Positive for shortness of breath.   Cardiovascular: Positive for leg swelling.  Gastrointestinal: Positive for blood in stool and constipation. Negative for diarrhea.  Endocrine: Negative for cold intolerance and heat intolerance.  Genitourinary: Negative for difficulty urinating.  Musculoskeletal: Negative for joint swelling.  Skin: Positive for color change. Negative for rash.  Allergic/Immunologic: Positive for food allergies.  Neurological: Negative for weakness.  Hematological: Does not bruise/bleed easily.  Psychiatric/Behavioral: Positive for sleep disturbance.     Objective: Vital Signs: BP 128/74   Pulse (!) 101   Ht 5\' 3"  (1.6 m)   Wt 161  lb (73 kg)   BMI 28.52 kg/m   Physical Exam Constitutional:      Appearance: She is well-developed.  Eyes:     Pupils: Pupils are equal, round, and reactive to light.  Pulmonary:     Effort: Pulmonary effort is normal.  Skin:    General: Skin is warm and dry.  Neurological:     Mental Status: She is alert and oriented to person, place, and time.  Psychiatric:        Behavior: Behavior normal.     Ortho Exam  Today she lacks a few degrees of extension.  She however can flex to about 95 to 100 degrees.  She has a trace to mild effusion.  No warmth or erythema.  She does have swelling about the pes anserine insertion.  This is also very tender to palpation.  Ligamentously stable.  No pain with hip motion.  Specialty Comments:  No specialty comments available.  Imaging: No results found.   PMFS History: Current Outpatient Medications  Medication Sig Dispense Refill  . HYDROcodone-acetaminophen (NORCO/VICODIN) 5-325 MG tablet Take 1 tablet by mouth every 6 (six) hours as needed for moderate pain. 30 tablet 0  . hydrOXYzine (ATARAX/VISTARIL) 50 MG tablet Take 1-2 tablets (50-100 mg total) by mouth every 8 (eight) hours as needed for itching. 60 tablet 2  . megestrol (MEGACE) 40 MG tablet TAKE 1 TABLET(40 MG) BY MOUTH TWICE DAILY 60 tablet 4  . meloxicam (MOBIC) 7.5 MG tablet Take 1 tablet (7.5 mg total) by mouth daily. 30 tablet 1  . Menthol-Methyl Salicylate (MUSCLE RUB) 10-15 % CREA Apply 1 application topically as needed for muscle pain.    . naproxen sodium (ALEVE) 220 MG tablet Take 220 mg by mouth 2 (two) times daily as needed (pain).     Marland Kitchen HYDROcodone-acetaminophen (NORCO/VICODIN) 5-325 MG tablet Take 1-2 tablets by mouth every 4 (four) hours as needed for moderate pain or severe pain. (Patient not taking: Reported on 04/29/2019) 30 tablet 0  . HYDROcodone-acetaminophen (NORCO/VICODIN) 5-325 MG tablet Take 1 tablet by mouth every 6 (six) hours as needed for moderate pain.  (Patient not taking: Reported on 04/29/2019) 30 tablet 0  . HYDROcodone-acetaminophen (NORCO/VICODIN) 5-325 MG tablet Take 1 tablet by mouth every 4 (four) hours as needed for moderate pain. (Patient not taking: Reported on 04/29/2019) 30 tablet 0  . HYDROcodone-acetaminophen (NORCO/VICODIN) 5-325 MG tablet Take 1 tablet by mouth every 6 (six) hours as needed for moderate pain. (Patient not taking: Reported on 04/29/2019) 30 tablet 0  . HYDROcodone-acetaminophen (NORCO/VICODIN) 5-325 MG tablet Take 1 tablet by mouth every 6 (six) hours as needed for moderate pain. (Patient not taking: Reported on 04/29/2019) 30 tablet 0  . oxyCODONE (ROXICODONE) 5 MG immediate release tablet Take 1-2 tablets (5-10 mg total) by mouth every 4 (four) hours as needed. (Patient not taking: Reported  on 04/29/2019) 30 tablet 0  . pantoprazole (PROTONIX) 40 MG tablet Take 1 tablet (40 mg total) by mouth 2 (two) times daily. 60 tablet 11  . traZODone (DESYREL) 50 MG tablet Take 1 tablet (50 mg total) by mouth at bedtime. 30 tablet 1   No current facility-administered medications for this visit.    Patient Active Problem List   Diagnosis Date Noted  . Pes anserine bursitis 04/29/2019  . Bucket handle tear of lateral meniscus of left knee 12/12/2018  . Ganglion cyst 12/12/2018  . Pain in left knee 11/15/2018  . Fibroids 03/18/2018  . S/P parathyroidectomy 02/12/2018  . Primary hyperparathyroidism ([place]) 01/08/2018  . Hyperparathyroidism , secondary, non-renal ([place]) 01/08/2018  . Fibroid uterus 12/06/2017  . Hypercalcemia 12/06/2017  . Fibroid 10/15/2017  . Abnormal uterine bleeding (AUB) 08/20/2017  . Anemia 08/20/2017   Past Medical History:  Diagnosis Date  . Anemia   . Fibroids   . GERD (gastroesophageal reflux disease)   . History of kidney stones   . Neck injury     Family History  Problem Relation Age of Onset  . Diabetes Sister   . Cancer Maternal Grandmother        ovarian  . Hypercalcemia Mother     Past  Surgical History:  Procedure Laterality Date  . CESAREAN SECTION    . IR ANGIOGRAM PELVIS SELECTIVE OR SUPRASELECTIVE  03/18/2018  . IR ANGIOGRAM PELVIS SELECTIVE OR SUPRASELECTIVE  03/18/2018  . IR ANGIOGRAM SELECTIVE EACH ADDITIONAL VESSEL  03/18/2018  . IR ANGIOGRAM SELECTIVE EACH ADDITIONAL VESSEL  03/18/2018  . IR EMBO TUMOR ORGAN ISCHEMIA INFARCT INC GUIDE ROADMAPPING  03/18/2018  . IR RADIOLOGIST EVAL & MGMT  10/17/2017  . IR RADIOLOGIST EVAL & MGMT  12/20/2017  . IR RADIOLOGIST EVAL & MGMT  04/17/2018  . IR US GUIDE VASC ACCESS RIGHT  03/18/2018  . KNEE ARTHROSCOPY WITH LATERAL MENISECTOMY Left 12/24/2018   Procedure: LEFT KNEE ARTHROSCOPY WITH LATERAL MENISECTOM, debriment of GANGLION CYST;  Surgeon: [name], MD;  Location: [facility];  Service: Orthopedics;  Laterality: Left;  . PARATHYROIDECTOMY N/A 02/12/2018   Procedure: PARATHYROIDECTOMY;  Surgeon: Fredirick Maudlin, MD;  Location: [facility];  Service: General;  Laterality: N/A;  . WISDOM TOOTH EXTRACTION     age 42   Social History   Occupational History  . Not on file  Tobacco Use  . Smoking status: Current Every Day Smoker    Packs/day: 0.25    Years: 14.00    Pack years: 3.50    Types: Cigarettes  . Smokeless tobacco: Never Used  Substance and Sexual Activity  . Alcohol use: Yes    Comment: occassionally on weekends  . Drug use: Yes    Types: Marijuana    Comment: last used 12/14/2018-marijuana  . Sexual activity: Yes    Birth control/protection: Condom, None

## 2019-05-01 ENCOUNTER — Ambulatory Visit (INDEPENDENT_AMBULATORY_CARE_PROVIDER_SITE_OTHER): Payer: Self-pay | Admitting: Physical Therapy

## 2019-05-01 ENCOUNTER — Encounter: Payer: Self-pay | Admitting: Physical Therapy

## 2019-05-01 ENCOUNTER — Other Ambulatory Visit: Payer: Self-pay

## 2019-05-01 DIAGNOSIS — R2689 Other abnormalities of gait and mobility: Secondary | ICD-10-CM

## 2019-05-01 DIAGNOSIS — R600 Localized edema: Secondary | ICD-10-CM

## 2019-05-01 DIAGNOSIS — M25662 Stiffness of left knee, not elsewhere classified: Secondary | ICD-10-CM

## 2019-05-01 DIAGNOSIS — M25562 Pain in left knee: Secondary | ICD-10-CM

## 2019-05-01 NOTE — Therapy (Signed)
[facility] Physical Therapy [address] [place], [place], [address] Phone: [phone]   Fax:  [phone]  Physical Therapy Treatment  Patient Details  Name: Katie Benson MRN: [id number] Date of Birth: 12/10/1980 Referring Provider (PT): Dr. Joni Fears   Encounter Date: 05/01/2019  PT End of Session - 05/01/19 1515    Visit Number  13    Number of Visits  20    Date for PT Re-Evaluation  06/16/19    PT Start Time  1638    PT Stop Time  1505    PT Time Calculation (min)  20 min    Activity Tolerance  Patient tolerated treatment well    Behavior During Therapy  [facility] for tasks assessed/performed       Past Medical History:  Diagnosis Date  . Anemia   . Fibroids   . GERD (gastroesophageal reflux disease)   . History of kidney stones   . Neck injury     Past Surgical History:  Procedure Laterality Date  . CESAREAN SECTION    . IR ANGIOGRAM PELVIS SELECTIVE OR SUPRASELECTIVE  03/18/2018  . IR ANGIOGRAM PELVIS SELECTIVE OR SUPRASELECTIVE  03/18/2018  . IR ANGIOGRAM SELECTIVE EACH ADDITIONAL VESSEL  03/18/2018  . IR ANGIOGRAM SELECTIVE EACH ADDITIONAL VESSEL  03/18/2018  . IR EMBO TUMOR ORGAN ISCHEMIA INFARCT INC GUIDE ROADMAPPING  03/18/2018  . IR RADIOLOGIST EVAL & MGMT  10/17/2017  . IR RADIOLOGIST EVAL & MGMT  12/20/2017  . IR RADIOLOGIST EVAL & MGMT  04/17/2018  . IR US GUIDE VASC ACCESS RIGHT  03/18/2018  . KNEE ARTHROSCOPY WITH LATERAL MENISECTOMY Left 12/24/2018   Procedure: LEFT KNEE ARTHROSCOPY WITH LATERAL MENISECTOM, debriment of GANGLION CYST;  Surgeon: [name], MD;  Location: [facility];  Service: Orthopedics;  Laterality: Left;  . PARATHYROIDECTOMY N/A 02/12/2018   Procedure: PARATHYROIDECTOMY;  Surgeon: Fredirick Maudlin, MD;  Location: [facility] OR[facility];  Service: General;  Laterality: N/A;  . WISDOM TOOTH EXTRACTION     age 39    There were no vitals filed for this visit.  Subjective Assessment - 05/01/19 1513    Subjective   had cortisone injection on Tuesday, knee is swollen and painful.  MD dx with RSD    Limitations  Standing;Walking    How long can you stand comfortably?  10 min    Patient Stated Goals  improve pain, motion    Currently in Pain?  Yes   did not rate   Pain Onset  1 to 4 weeks ago                       [facility] Adult PT Treatment/Exercise - 05/01/19 1514      Self-Care   Self-Care  Other Self-Care Comments    Other Self-Care Comments   discussed treatment for RSD - desensitization activiites and mirror therapy to help with pain      Manual Therapy   Manual therapy comments  KT tape for edema - 30% stretch medial/lateral on Lt knee                  PT Long Term Goals - 04/21/19 1257      PT LONG TERM GOAL #1   Title  independent with HEP    Baseline  2/22: progressing - still working on pain control to progress HEP    Status  On-going    Target Date  05/19/19      PT LONG TERM GOAL #  2   Title  improve Lt knee AROM 0-115 for improved function    Baseline  met for flexion, still missing 4 deg of knee ext    Status  Achieved      PT LONG TERM GOAL #3   Title  amb without AD independently without deviations for improved function    Baseline  2/22: can walk without AD for only household distances and has deviations/compensations; still using single crutch for community ambulation    Status  On-going    Target Date  05/19/19      PT LONG TERM GOAL #4   Title  report pain < 4/10 with activity for improved pain    Baseline  2/22: 6/10; still variable    Status  On-going    Target Date  05/19/19      PT LONG TERM GOAL #5   Title  demonstrate at least 4+/5 LLE strength    Baseline  did not test today can achieve at least 3/5    Status  On-going    Target Date  05/19/19            Plan - 05/01/19 1516    Clinical Impression Statement  Pt arrived with c/o increased pain following cortisone injection and reports MD feels she has RSD to Lt knee so  session today focused on education of tx strategies to help with RSD as well as taping for edema.  Will continue to benefit from PT to maximize function.    Examination-Activity Limitations  Stand;Locomotion Level;Squat;Stairs    Examination-Participation Restrictions  Other   occupation   Stability/Clinical Decision Making  Stable/Uncomplicated    Rehab Potential  Good    PT Frequency  2x / week    PT Duration  6 weeks    PT Treatment/Interventions  ADLs/Self Care Home Management;Cryotherapy;Electrical Stimulation;DME Instruction;Ultrasound;Moist Heat;Iontophoresis 53m/ml Dexamethasone;Gait training;Stair training;Functional mobility training;Therapeutic activities;Therapeutic exercise;Balance training;Patient/family education;Neuromuscular re-education;Manual techniques;Vasopneumatic Device;Taping;Passive range of motion    PT Next Visit Plan  continue with strengthening, gait without crutches, manual/modalities PRN    PT Home Exercise Plan  Access Code: [email or web address]    Consulted and Agree with Plan of Care  Patient       Patient will benefit from skilled therapeutic intervention in order to improve the following deficits and impairments:  Abnormal gait, Pain, Decreased mobility, Decreased range of motion, Decreased strength, Increased edema, Difficulty walking  Visit Diagnosis: Acute pain of left knee  Stiffness of left knee, not elsewhere classified  Other abnormalities of gait and mobility  Localized edema     Problem List Patient Active Problem List   Diagnosis Date Noted  . Pes anserine bursitis 04/29/2019  . Bucket handle tear of lateral meniscus of left knee 12/12/2018  . Ganglion cyst 12/12/2018  . Pain in left knee 11/15/2018  . Fibroids 03/18/2018  . S/P parathyroidectomy 02/12/2018  . [place] hyperparathyroidism ([place] 01/08/2018  . Hyperparathyroidism , secondary, non-renal ([place] 01/08/2018  . Fibroid uterus 12/06/2017  . Hypercalcemia 12/06/2017  . Fibroid  10/15/2017  . Abnormal uterine bleeding (AUB) 08/20/2017  . Anemia 08/20/2017      SLaureen Abrahams PT, DPT 05/01/19 3:18 PM   [facility] [address] [address]Phone: [phone]  Fax:  [phone] Name: ANolia TschantzMRN: [id number] of Birth: 807-10-1980

## 2019-05-06 ENCOUNTER — Encounter: Payer: Self-pay | Admitting: Physical Therapy

## 2019-05-08 ENCOUNTER — Ambulatory Visit (INDEPENDENT_AMBULATORY_CARE_PROVIDER_SITE_OTHER): Payer: Self-pay | Admitting: Physical Therapy

## 2019-05-08 ENCOUNTER — Other Ambulatory Visit: Payer: Self-pay

## 2019-05-08 ENCOUNTER — Encounter: Payer: Self-pay | Admitting: Physical Therapy

## 2019-05-08 DIAGNOSIS — R2689 Other abnormalities of gait and mobility: Secondary | ICD-10-CM

## 2019-05-08 DIAGNOSIS — M25662 Stiffness of left knee, not elsewhere classified: Secondary | ICD-10-CM

## 2019-05-08 DIAGNOSIS — M25562 Pain in left knee: Secondary | ICD-10-CM

## 2019-05-08 DIAGNOSIS — R600 Localized edema: Secondary | ICD-10-CM

## 2019-05-08 NOTE — Therapy (Signed)
[facility] Physical Therapy [address] [place], [place], [address] Phone: [phone]   Fax:  [phone]  Physical Therapy Treatment  Patient Details  Name: Katie Benson MRN: [id number] Date of Birth: 1980-12-17 Referring Provider (PT): Dr. Joni Fears   Encounter Date: 05/08/2019  PT End of Session - 05/08/19 1514    Visit Number  14    Number of Visits  20    Date for PT Re-Evaluation  06/16/19    PT Start Time  1440   pt requested short session   PT Stop Time  1514    PT Time Calculation (min)  34 min    Activity Tolerance  Patient tolerated treatment well    Behavior During Therapy  [facility] for tasks assessed/performed       Past Medical History:  Diagnosis Date  . Anemia   . Fibroids   . GERD (gastroesophageal reflux disease)   . History of kidney stones   . Neck injury     Past Surgical History:  Procedure Laterality Date  . CESAREAN SECTION    . IR ANGIOGRAM PELVIS SELECTIVE OR SUPRASELECTIVE  03/18/2018  . IR ANGIOGRAM PELVIS SELECTIVE OR SUPRASELECTIVE  03/18/2018  . IR ANGIOGRAM SELECTIVE EACH ADDITIONAL VESSEL  03/18/2018  . IR ANGIOGRAM SELECTIVE EACH ADDITIONAL VESSEL  03/18/2018  . IR EMBO TUMOR ORGAN ISCHEMIA INFARCT INC GUIDE ROADMAPPING  03/18/2018  . IR RADIOLOGIST EVAL & MGMT  10/17/2017  . IR RADIOLOGIST EVAL & MGMT  12/20/2017  . IR RADIOLOGIST EVAL & MGMT  04/17/2018  . IR US GUIDE VASC ACCESS RIGHT  03/18/2018  . KNEE ARTHROSCOPY WITH LATERAL MENISECTOMY Left 12/24/2018   Procedure: LEFT KNEE ARTHROSCOPY WITH LATERAL MENISECTOM, debriment of GANGLION CYST;  Surgeon: [name], MD;  Location: [facility];  Service: Orthopedics;  Laterality: Left;  . PARATHYROIDECTOMY N/A 02/12/2018   Procedure: PARATHYROIDECTOMY;  Surgeon: Fredirick Maudlin, MD;  Location: [facility];  Service: General;  Laterality: N/A;  . WISDOM TOOTH EXTRACTION     age 39    There were no vitals filed for this visit.  Subjective Assessment -  05/08/19 1445    Subjective  feels a lot better; has occasional episodes of clicking with quick movements but overall doing well.  wants to keep session short since her dog had puppies recently    Limitations  Standing;Walking    How long can you stand comfortably?  10 min    Patient Stated Goals  improve pain, motion    Currently in Pain?  No/denies    Pain Onset  1 to 4 weeks ago                       [facility] Adult PT Treatment/Exercise - 05/08/19 1446      Knee/Hip Exercises: Aerobic   Recumbent Bike  Seat 6 -no resistance x 5 min      Knee/Hip Exercises: Seated   Long Arc Quad  Left;20 reps    Long Arc Quad Weight  2 lbs.    Ball Squeeze  20x 5 sec    Hamstring Curl  Left;20 reps    Hamstring Limitations  L2 band      Manual Therapy   Manual therapy comments  KT tape for edema - 30% stretch lateral "C" around patella with horizontal anchor across patella                  PT Long Term Goals - 04/21/19 1257  PT LONG TERM GOAL #1   Title  independent with HEP    Baseline  2/22: progressing - still working on pain control to progress HEP    Status  On-going    Target Date  05/19/19      PT LONG TERM GOAL #2   Title  improve Lt knee AROM 0-115 for improved function    Baseline  met for flexion, still missing 4 deg of knee ext    Status  Achieved      PT LONG TERM GOAL #3   Title  amb without AD independently without deviations for improved function    Baseline  2/22: can walk without AD for only household distances and has deviations/compensations; still using single crutch for community ambulation    Status  On-going    Target Date  05/19/19      PT LONG TERM GOAL #4   Title  report pain < 4/10 with activity for improved pain    Baseline  2/22: 6/10; still variable    Status  On-going    Target Date  05/19/19      PT LONG TERM GOAL #5   Title  demonstrate at least 4+/5 LLE strength    Baseline  did not test today can achieve at least  3/5    Status  On-going    Target Date  05/19/19            Plan - 05/08/19 1514    Clinical Impression Statement  Tolerated increase in activity today without significant increase in symptoms.  Swelling continues to be present especially proximal patella area.  Slowly progressing with PT.    Examination-Activity Limitations  Stand;Locomotion Level;Squat;Stairs    Examination-Participation Restrictions  Other   occupation   Stability/Clinical Decision Making  Stable/Uncomplicated    Rehab Potential  Good    PT Frequency  2x / week    PT Duration  6 weeks    PT Treatment/Interventions  ADLs/Self Care Home Management;Cryotherapy;Electrical Stimulation;DME Instruction;Ultrasound;Moist Heat;Iontophoresis 84m/ml Dexamethasone;Gait training;Stair training;Functional mobility training;Therapeutic activities;Therapeutic exercise;Balance training;Patient/family education;Neuromuscular re-education;Manual techniques;Vasopneumatic Device;Taping;Passive range of motion    PT Next Visit Plan  continue with strengthening, gait without crutches, manual/modalities PRN; progress strengthening exercises    PT Home Exercise Plan  Access Code: [email or web address]    Consulted and Agree with Plan of Care  Patient       Patient will benefit from skilled therapeutic intervention in order to improve the following deficits and impairments:  Abnormal gait, Pain, Decreased mobility, Decreased range of motion, Decreased strength, Increased edema, Difficulty walking  Visit Diagnosis: Acute pain of left knee  Stiffness of left knee, not elsewhere classified  Other abnormalities of gait and mobility  Localized edema     Problem List Patient Active Problem List   Diagnosis Date Noted  . Pes anserine bursitis 04/29/2019  . Bucket handle tear of lateral meniscus of left knee 12/12/2018  . Ganglion cyst 12/12/2018  . Pain in left knee 11/15/2018  . Fibroids 03/18/2018  . S/P parathyroidectomy 02/12/2018  .  Primary hyperparathyroidism ([place] 01/08/2018  . Hyperparathyroidism , secondary, non-renal ([place] 01/08/2018  . Fibroid uterus 12/06/2017  . Hypercalcemia 12/06/2017  . Fibroid 10/15/2017  . Abnormal uterine bleeding (AUB) 08/20/2017  . Anemia 08/20/2017      SLaureen Abrahams PT, DPT 05/08/19 3:17 PM     [facility] [address] [address]Phone: [phone]  Fax:  [phone] Name: ASymia Herdt  [place] MRN: [id number] Date of Birth: [birth year]

## 2019-05-13 ENCOUNTER — Other Ambulatory Visit: Payer: Self-pay

## 2019-05-13 ENCOUNTER — Ambulatory Visit (INDEPENDENT_AMBULATORY_CARE_PROVIDER_SITE_OTHER): Payer: Self-pay | Admitting: Physical Therapy

## 2019-05-13 DIAGNOSIS — R2689 Other abnormalities of gait and mobility: Secondary | ICD-10-CM

## 2019-05-13 DIAGNOSIS — M25662 Stiffness of left knee, not elsewhere classified: Secondary | ICD-10-CM

## 2019-05-13 DIAGNOSIS — M25562 Pain in left knee: Secondary | ICD-10-CM

## 2019-05-13 DIAGNOSIS — R600 Localized edema: Secondary | ICD-10-CM

## 2019-05-13 NOTE — Therapy (Signed)
[facility] Physical Therapy [address] [place], [place], [address] Phone: [phone]   Fax:  [phone]  Physical Therapy Treatment  Patient Details  Name: Katie Benson MRN: [id number] Date of Birth: 01-Jan-1981 Referring Provider (PT): Dr. Joni Fears   Encounter Date: 05/13/2019  PT End of Session - 05/13/19 1620    Visit Number  15    Number of Visits  20    Date for PT Re-Evaluation  06/16/19    PT Start Time  9169    PT Stop Time  1520    PT Time Calculation (min)  35 min    Activity Tolerance  Patient tolerated treatment well    Behavior During Therapy  [facility] for tasks assessed/performed       Past Medical History:  Diagnosis Date  . Anemia   . Fibroids   . GERD (gastroesophageal reflux disease)   . History of kidney stones   . Neck injury     Past Surgical History:  Procedure Laterality Date  . CESAREAN SECTION    . IR ANGIOGRAM PELVIS SELECTIVE OR SUPRASELECTIVE  03/18/2018  . IR ANGIOGRAM PELVIS SELECTIVE OR SUPRASELECTIVE  03/18/2018  . IR ANGIOGRAM SELECTIVE EACH ADDITIONAL VESSEL  03/18/2018  . IR ANGIOGRAM SELECTIVE EACH ADDITIONAL VESSEL  03/18/2018  . IR EMBO TUMOR ORGAN ISCHEMIA INFARCT INC GUIDE ROADMAPPING  03/18/2018  . IR RADIOLOGIST EVAL & MGMT  10/17/2017  . IR RADIOLOGIST EVAL & MGMT  12/20/2017  . IR RADIOLOGIST EVAL & MGMT  04/17/2018  . IR US GUIDE VASC ACCESS RIGHT  03/18/2018  . KNEE ARTHROSCOPY WITH LATERAL MENISECTOMY Left 12/24/2018   Procedure: LEFT KNEE ARTHROSCOPY WITH LATERAL MENISECTOM, debriment of GANGLION CYST;  Surgeon: [name], MD;  Location: [facility];  Service: Orthopedics;  Laterality: Left;  . PARATHYROIDECTOMY N/A 02/12/2018   Procedure: PARATHYROIDECTOMY;  Surgeon: Fredirick Maudlin, MD;  Location: [facility] ORS;  Service: General;  Laterality: N/A;  . WISDOM TOOTH EXTRACTION     age 39    There were no vitals filed for this visit.  Subjective Assessment - 05/13/19 1522    Subjective   overall her Lt knee is doing a little better but still having some swelling and clicking in her knee.    Limitations  Standing;Walking    How long can you stand comfortably?  10 min    How long can you walk comfortably?  5 min    Patient Stated Goals  improve pain, motion    Pain Onset  1 to 4 weeks ago         [facility] PT Assessment - 05/13/19 0001      Observation/Other Assessments-Edema    Edema  Circumferential   knee jt line on Rt 14.5 inch on left 14.75 inch                  [facility] Adult PT Treatment/Exercise - 05/13/19 0001      Knee/Hip Exercises: Stretches   Active Hamstring Stretch  Left;2 reps;30 seconds    Gastroc Stretch  Left;2 reps;30 seconds      Knee/Hip Exercises: Aerobic   Recumbent Bike  Seat 6 -no resistance x 5 min      Knee/Hip Exercises: Machines for Strengthening   Total Gym Leg Press  6 lbs bilat leg press  X10 reps, then eccentric press for Lt (pushing up with Rt and down with left) X 10. Attempted concentric press with Lt only at 6 lbs but too difficult for  her      Manual Therapy   Manual therapy comments  KT tape for edema - 30% stretch lateral "C" around patella with horizontal anchor across patella                  PT Long Term Goals - 04/21/19 1257      PT LONG TERM GOAL #1   Title  independent with HEP    Baseline  2/22: progressing - still working on pain control to progress HEP    Status  On-going    Target Date  05/19/19      PT LONG TERM GOAL #2   Title  improve Lt knee AROM 0-115 for improved function    Baseline  met for flexion, still missing 4 deg of knee ext    Status  Achieved      PT LONG TERM GOAL #3   Title  amb without AD independently without deviations for improved function    Baseline  2/22: can walk without AD for only household distances and has deviations/compensations; still using single crutch for community ambulation    Status  On-going    Target Date  05/19/19      PT LONG TERM GOAL #4    Title  report pain < 4/10 with activity for improved pain    Baseline  2/22: 6/10; still variable    Status  On-going    Target Date  05/19/19      PT LONG TERM GOAL #5   Title  demonstrate at least 4+/5 LLE strength    Baseline  did not test today can achieve at least 3/5    Status  On-going    Target Date  05/19/19            Plan - 05/13/19 1622    Clinical Impression Statement  Still having some knee pain and swelling but she was encouraged to keep progressing activity tolerance as able. Able to tolerate very light leg press today. Continued with KT tape.    Examination-Activity Limitations  Stand;Locomotion Level;Squat;Stairs    Examination-Participation Restrictions  Other   occupation   Stability/Clinical Decision Making  Stable/Uncomplicated    Rehab Potential  Good    PT Frequency  2x / week    PT Duration  6 weeks    PT Treatment/Interventions  ADLs/Self Care Home Management;Cryotherapy;Electrical Stimulation;DME Instruction;Ultrasound;Moist Heat;Iontophoresis 27m/ml Dexamethasone;Gait training;Stair training;Functional mobility training;Therapeutic activities;Therapeutic exercise;Balance training;Patient/family education;Neuromuscular re-education;Manual techniques;Vasopneumatic Device;Taping;Passive range of motion    PT Next Visit Plan  continue with strengthening, gait without crutches, manual/modalities PRN; progress strengthening exercises    PT Home Exercise Plan  Access Code: [email or web address]    Consulted and Agree with Plan of Care  Patient       Patient will benefit from skilled therapeutic intervention in order to improve the following deficits and impairments:  Abnormal gait, Pain, Decreased mobility, Decreased range of motion, Decreased strength, Increased edema, Difficulty walking  Visit Diagnosis: Acute pain of left knee  Stiffness of left knee, not elsewhere classified  Other abnormalities of gait and mobility  Localized edema     Problem  List Patient Active Problem List   Diagnosis Date Noted  . Pes anserine bursitis 04/29/2019  . Bucket handle tear of lateral meniscus of left knee 12/12/2018  . Ganglion cyst 12/12/2018  . Pain in left knee 11/15/2018  . Fibroids 03/18/2018  . S/P parathyroidectomy 02/12/2018  . Primary hyperparathyroidism ([place] 01/08/2018  . Hyperparathyroidism , secondary, non-renal ([place]  01/08/2018  . Fibroid uterus 12/06/2017  . Hypercalcemia 12/06/2017  . Fibroid 10/15/2017  . Abnormal uterine bleeding (AUB) 08/20/2017  . Anemia 08/20/2017    Debbe Odea, PT,DPT 05/13/2019, 4:30 PM  [facility] Physical Therapy [address] [place], [place], [address] Phone: [phone]   Fax:  [phone]  Name: Katie Benson MRN: [id number] Date of Birth: [birth year]

## 2019-05-15 ENCOUNTER — Ambulatory Visit (INDEPENDENT_AMBULATORY_CARE_PROVIDER_SITE_OTHER): Payer: Self-pay | Admitting: Physical Therapy

## 2019-05-15 ENCOUNTER — Other Ambulatory Visit: Payer: Self-pay

## 2019-05-15 ENCOUNTER — Encounter: Payer: Self-pay | Admitting: Physical Therapy

## 2019-05-15 DIAGNOSIS — R2689 Other abnormalities of gait and mobility: Secondary | ICD-10-CM

## 2019-05-15 DIAGNOSIS — R600 Localized edema: Secondary | ICD-10-CM

## 2019-05-15 DIAGNOSIS — M25562 Pain in left knee: Secondary | ICD-10-CM

## 2019-05-15 DIAGNOSIS — M25662 Stiffness of left knee, not elsewhere classified: Secondary | ICD-10-CM

## 2019-05-15 NOTE — Therapy (Signed)
[facility] Physical Therapy [address] [place], [place], [address] Phone: [phone]   Fax:  [phone]  Physical Therapy Treatment  Patient Details  Name: Katie Benson MRN: [id number] Date of Birth: 1980-12-12 Referring Provider (PT): Dr. Joni Fears   Encounter Date: 05/15/2019  PT End of Session - 05/15/19 1522    Visit Number  16    Number of Visits  20    Date for PT Re-Evaluation  06/16/19    PT Start Time  1601    PT Stop Time  1523    PT Time Calculation (min)  40 min    Activity Tolerance  Patient tolerated treatment well    Behavior During Therapy  [facility] for tasks assessed/performed       Past Medical History:  Diagnosis Date  . Anemia   . Fibroids   . GERD (gastroesophageal reflux disease)   . History of kidney stones   . Neck injury     Past Surgical History:  Procedure Laterality Date  . CESAREAN SECTION    . IR ANGIOGRAM PELVIS SELECTIVE OR SUPRASELECTIVE  03/18/2018  . IR ANGIOGRAM PELVIS SELECTIVE OR SUPRASELECTIVE  03/18/2018  . IR ANGIOGRAM SELECTIVE EACH ADDITIONAL VESSEL  03/18/2018  . IR ANGIOGRAM SELECTIVE EACH ADDITIONAL VESSEL  03/18/2018  . IR EMBO TUMOR ORGAN ISCHEMIA INFARCT INC GUIDE ROADMAPPING  03/18/2018  . IR RADIOLOGIST EVAL & MGMT  10/17/2017  . IR RADIOLOGIST EVAL & MGMT  12/20/2017  . IR RADIOLOGIST EVAL & MGMT  04/17/2018  . IR US GUIDE VASC ACCESS RIGHT  03/18/2018  . KNEE ARTHROSCOPY WITH LATERAL MENISECTOMY Left 12/24/2018   Procedure: LEFT KNEE ARTHROSCOPY WITH LATERAL MENISECTOM, debriment of GANGLION CYST;  Surgeon: [name], MD;  Location: [facility];  Service: Orthopedics;  Laterality: Left;  . PARATHYROIDECTOMY N/A 02/12/2018   Procedure: PARATHYROIDECTOMY;  Surgeon: Fredirick Maudlin, MD;  Location: [facility];  Service: General;  Laterality: N/A;  . WISDOM TOOTH EXTRACTION     age 39    There were no vitals filed for this visit.  Subjective Assessment - 05/15/19 1448    Subjective   knee pain is a little worse today - thinks the leg press may have caused increased pain.  walking around house without crutch some    Limitations  Standing;Walking    How long can you stand comfortably?  10 min    How long can you walk comfortably?  5 min    Patient Stated Goals  improve pain, motion    Currently in Pain?  Yes    Pain Score  7     Pain Location  Knee    Pain Orientation  Left    Pain Descriptors / Indicators  Sharp;Throbbing    Pain Type  Surgical pain    Pain Onset  More than a month ago    Pain Frequency  Intermittent    Aggravating Factors   standing, walking, bending    Pain Relieving Factors  meds, ice         [facility] PT Assessment - 05/15/19 1449      Assessment   Medical Diagnosis  S/P left knee arthroscopy with lateral meniscectomy and ganglion cyst debridement.    Referring Provider (PT)  Dr. Joni Fears    Onset Date/Surgical Date  12/24/18    Next MD Visit  05/27/19                   [facility] Adult PT Treatment/Exercise -  05/15/19 1449      Knee/Hip Exercises: Stretches   Active Hamstring Stretch  Left;2 reps;30 seconds    Active Hamstring Stretch Limitations  standing with foot on step    Knee: Self-Stretch to increase Flexion  Left;3 reps;30 seconds    Knee: Self-Stretch Limitations  foot on step    Gastroc Stretch  Left;2 reps;30 seconds    Gastroc Stretch Limitations  slant board stretch 3x30 sec      Knee/Hip Exercises: Aerobic   Recumbent Bike  seat 7; no resistance x 5 min      Knee/Hip Exercises: Standing   Terminal Knee Extension  20 reps;Theraband    Theraband Level (Terminal Knee Extension)  Other (comment)   L5     Manual Therapy   Manual therapy comments  KT tape for edema - 30% stretch lateral "C" around patella with horizontal anchor across patella                  PT Long Term Goals - 04/21/19 1257      PT LONG TERM GOAL #1   Title  independent with HEP    Baseline  2/22: progressing - still  working on pain control to progress HEP    Status  On-going    Target Date  05/19/19      PT LONG TERM GOAL #2   Title  improve Lt knee AROM 0-115 for improved function    Baseline  met for flexion, still missing 4 deg of knee ext    Status  Achieved      PT LONG TERM GOAL #3   Title  amb without AD independently without deviations for improved function    Baseline  2/22: can walk without AD for only household distances and has deviations/compensations; still using single crutch for community ambulation    Status  On-going    Target Date  05/19/19      PT LONG TERM GOAL #4   Title  report pain < 4/10 with activity for improved pain    Baseline  2/22: 6/10; still variable    Status  On-going    Target Date  05/19/19      PT LONG TERM GOAL #5   Title  demonstrate at least 4+/5 LLE strength    Baseline  did not test today can achieve at least 3/5    Status  On-going    Target Date  05/19/19            Plan - 05/15/19 1523    Clinical Impression Statement  Tolerated standing exercises well today with mild increase in knee tightness with standing flexion and hamstring stretches.  Overall slowly progressing with PT.    Examination-Activity Limitations  Stand;Locomotion Level;Squat;Stairs    Examination-Participation Restrictions  Other   occupation   Stability/Clinical Decision Making  Stable/Uncomplicated    Rehab Potential  Good    PT Frequency  2x / week    PT Duration  6 weeks    PT Treatment/Interventions  ADLs/Self Care Home Management;Cryotherapy;Electrical Stimulation;DME Instruction;Ultrasound;Moist Heat;Iontophoresis 39m/ml Dexamethasone;Gait training;Stair training;Functional mobility training;Therapeutic activities;Therapeutic exercise;Balance training;Patient/family education;Neuromuscular re-education;Manual techniques;Vasopneumatic Device;Taping;Passive range of motion    PT Next Visit Plan  look at goals next week and prepare MD note, see how mediation goes     PT Home Exercise Plan  Access Code: [email or web address]    Consulted and Agree with Plan of Care  Patient       Patient will benefit from skilled therapeutic intervention  in order to improve the following deficits and impairments:  Abnormal gait, Pain, Decreased mobility, Decreased range of motion, Decreased strength, Increased edema, Difficulty walking  Visit Diagnosis: Acute pain of left knee  Stiffness of left knee, not elsewhere classified  Other abnormalities of gait and mobility  Localized edema     Problem List Patient Active Problem List   Diagnosis Date Noted  . Pes anserine bursitis 04/29/2019  . Bucket handle tear of lateral meniscus of left knee 12/12/2018  . Ganglion cyst 12/12/2018  . Pain in left knee 11/15/2018  . Fibroids 03/18/2018  . S/P parathyroidectomy 02/12/2018  . Primary hyperparathyroidism ([place]) 01/08/2018  . Hyperparathyroidism , secondary, non-renal ([place]) 01/08/2018  . Fibroid uterus 12/06/2017  . Hypercalcemia 12/06/2017  . Fibroid 10/15/2017  . Abnormal uterine bleeding (AUB) 08/20/2017  . Anemia 08/20/2017      Laureen Abrahams, PT, DPT 05/15/19 3:25 PM     [facility] Physical Therapy [address] [place], [place], [address] Phone: [phone]   Fax:  [phone]  Name: Katie Benson MRN: [id number] Date of Birth: 12-13-1980

## 2019-05-20 ENCOUNTER — Encounter: Payer: Self-pay | Admitting: Physical Therapy

## 2019-05-20 ENCOUNTER — Telehealth: Payer: Self-pay | Admitting: Physical Therapy

## 2019-05-20 NOTE — Telephone Encounter (Signed)
Spoke with pt about NS for appt.  She forgot about her appointment.  Reminded of next appointment.  Laureen Abrahams, PT, DPT 05/20/19 3:15 PM

## 2019-05-21 ENCOUNTER — Encounter: Payer: Self-pay | Admitting: Orthopaedic Surgery

## 2019-05-22 ENCOUNTER — Other Ambulatory Visit: Payer: Self-pay

## 2019-05-22 ENCOUNTER — Ambulatory Visit (INDEPENDENT_AMBULATORY_CARE_PROVIDER_SITE_OTHER): Payer: Self-pay | Admitting: Physical Therapy

## 2019-05-22 ENCOUNTER — Encounter: Payer: Self-pay | Admitting: Physical Therapy

## 2019-05-22 DIAGNOSIS — R2689 Other abnormalities of gait and mobility: Secondary | ICD-10-CM

## 2019-05-22 DIAGNOSIS — R600 Localized edema: Secondary | ICD-10-CM

## 2019-05-22 DIAGNOSIS — M25562 Pain in left knee: Secondary | ICD-10-CM

## 2019-05-22 DIAGNOSIS — M25662 Stiffness of left knee, not elsewhere classified: Secondary | ICD-10-CM

## 2019-05-22 NOTE — Therapy (Addendum)
[facility] Physical Therapy [address] [place], [place], [address] Phone: [phone]   Fax:  [phone]  Physical Therapy Treatment/Discharge Summary  Patient Details  Name: Katie Benson MRN: [id number] Date of Birth: [birth year] Referring Provider (PT): Dr. Joni Fears   Encounter Date: 05/22/2019  PT End of Session - 05/22/19 1504    Visit Number  17    Number of Visits  20    Date for PT Re-Evaluation  06/16/19    PT Start Time  3491    PT Stop Time  1513    PT Time Calculation (min)  28 min    Activity Tolerance  Patient tolerated treatment well    Behavior During Therapy  [facility] for tasks assessed/performed       Past Medical History:  Diagnosis Date  . Anemia   . Fibroids   . GERD (gastroesophageal reflux disease)   . History of kidney stones   . Neck injury     Past Surgical History:  Procedure Laterality Date  . CESAREAN SECTION    . IR ANGIOGRAM PELVIS SELECTIVE OR SUPRASELECTIVE  03/18/2018  . IR ANGIOGRAM PELVIS SELECTIVE OR SUPRASELECTIVE  03/18/2018  . IR ANGIOGRAM SELECTIVE EACH ADDITIONAL VESSEL  03/18/2018  . IR ANGIOGRAM SELECTIVE EACH ADDITIONAL VESSEL  03/18/2018  . IR EMBO TUMOR ORGAN ISCHEMIA INFARCT INC GUIDE ROADMAPPING  03/18/2018  . IR RADIOLOGIST EVAL & MGMT  10/17/2017  . IR RADIOLOGIST EVAL & MGMT  12/20/2017  . IR RADIOLOGIST EVAL & MGMT  04/17/2018  . IR US GUIDE VASC ACCESS RIGHT  03/18/2018  . KNEE ARTHROSCOPY WITH LATERAL MENISECTOMY Left 12/24/2018   Procedure: LEFT KNEE ARTHROSCOPY WITH LATERAL MENISECTOM, debriment of GANGLION CYST;  Surgeon: [name], MD;  Location: [facility];  Service: Orthopedics;  Laterality: Left;  . PARATHYROIDECTOMY N/A 02/12/2018   Procedure: PARATHYROIDECTOMY;  Surgeon: Fredirick Maudlin, MD;  Location: [facility] OR[facility];  Service: General;  Laterality: N/A;  . WISDOM TOOTH EXTRACTION     age 39    There were no vitals filed for this visit.  Subjective Assessment - 05/22/19  1450    Subjective  completed mediation yesterday.  needs to get financial assistance set up with [employer].    Limitations  Standing;Walking    How long can you stand comfortably?  10 min    How long can you walk comfortably?  5 min    Patient Stated Goals  improve pain, motion    Currently in Pain?  Yes    Pain Score  8     Pain Location  Knee    Pain Orientation  Left    Pain Descriptors / Indicators  Sharp;Throbbing    Pain Type  Surgical pain    Pain Onset  More than a month ago    Pain Frequency  Intermittent    Aggravating Factors   standing, walking, bending    Pain Relieving Factors  meds, ice         [facility] PT Assessment - 05/22/19 1513      Assessment   Medical Diagnosis  S/P left knee arthroscopy with lateral meniscectomy and ganglion cyst debridement.    Referring Provider (PT)  Dr. Joni Fears    Onset Date/Surgical Date  12/24/18      AROM   Left Knee Extension  0    Left Knee Flexion  90   previously 120; pain today  [facility] Adult PT Treatment/Exercise - 05/22/19 1452      Vasopneumatic   Number Minutes Vasopneumatic   10 minutes    Vasopnuematic Location   Knee    Vasopneumatic Pressure  Low    Vasopneumatic Temperature   34 deg      Manual Therapy   Manual Therapy  Taping    Manual therapy comments  KT tape for edema - 30% stretch lateral "C" around patella with horizontal anchor across patella                  PT Long Term Goals - 05/22/19 1514      PT LONG TERM GOAL #1   Title  independent with HEP    Baseline  3/25: met to date, still working to progress to more challenging exercises    Status  On-going    Target Date  06/16/19      PT LONG TERM GOAL #2   Title  improve Lt knee AROM 0-115 for improved function    Status  Achieved      PT LONG TERM GOAL #3   Title  amb without AD independently without deviations for improved function    Baseline  3/25: can walk without AD for only household distances  and has deviations/compensations; still using single crutch for community ambulation    Status  On-going    Target Date  06/16/19      PT LONG TERM GOAL #4   Title  report pain < 4/10 with activity for improved pain    Baseline  3/25: still elevated at times    Status  On-going      PT LONG TERM GOAL #5   Title  demonstrate at least 4+/5 LLE strength    Baseline  did not test today due to pain can achieve at least 3/5    Status  On-going    Target Date  06/16/19            Plan - 05/22/19 1517    Clinical Impression Statement  Pt reporting increased pain today, and has financial concerns about continuing with PT.  Advised to call financial assistance and will resume therapy services if covered.  ROM 0-90 today, has been up to 120 deg when pain is lower.  Slowly progressing, still with persistent swelling around Lt knee affecting pain and activity tolerance.    Examination-Activity Limitations  Stand;Locomotion Level;Squat;Stairs    Examination-Participation Restrictions  Other   occupation   Stability/Clinical Decision Making  Stable/Uncomplicated    Rehab Potential  Good    PT Frequency  2x / week    PT Duration  6 weeks    PT Treatment/Interventions  ADLs/Self Care Home Management;Cryotherapy;Electrical Stimulation;DME Instruction;Ultrasound;Moist Heat;Iontophoresis 27m/ml Dexamethasone;Gait training;Stair training;Functional mobility training;Therapeutic activities;Therapeutic exercise;Balance training;Patient/family education;Neuromuscular re-education;Manual techniques;Vasopneumatic Device;Taping;Passive range of motion    PT Next Visit Plan  continue with strengthening exercises, progress as able.    PT Home Exercise Plan  Access Code: [email or web address]    Consulted and Agree with Plan of Care  Patient       Patient will benefit from skilled therapeutic intervention in order to improve the following deficits and impairments:  Abnormal gait, Pain, Decreased mobility, Decreased range  of motion, Decreased strength, Increased edema, Difficulty walking  Visit Diagnosis: Acute pain of left knee  Stiffness of left knee, not elsewhere classified  Other abnormalities of gait and mobility  Localized edema     Problem List Patient Active Problem List  Diagnosis Date Noted  . Pes anserine bursitis 04/29/2019  . Bucket handle tear of lateral meniscus of left knee 12/12/2018  . Ganglion cyst 12/12/2018  . Pain in left knee 11/15/2018  . Fibroids 03/18/2018  . S/P parathyroidectomy 02/12/2018  . Primary hyperparathyroidism ([place]) 01/08/2018  . Hyperparathyroidism , secondary, non-renal ([place]) 01/08/2018  . Fibroid uterus 12/06/2017  . Hypercalcemia 12/06/2017  . Fibroid 10/15/2017  . Abnormal uterine bleeding (AUB) 08/20/2017  . Anemia 08/20/2017      Laureen Abrahams, PT, DPT 05/22/19 3:20 PM    [facility] [address] [place], [place], [address] Phone: [phone]   Fax:  [phone]  Name: Katie Benson MRN: [id number] Date of Birth: 01-Nov-1980     PHYSICAL THERAPY DISCHARGE SUMMARY  Visits from Start of Care: 17  Current functional level related to goals / functional outcomes: See above   Remaining deficits: See above   Education / Equipment: HEP  Plan: Patient agrees to discharge.  Patient goals were not met. Patient is being discharged due to financial reasons.  ?????    Laureen Abrahams, PT, DPT 08/05/19 9:13 AM  [facility] Physical Therapy [address] [place], [place], [address] Phone: [phone]   Fax:  [phone]

## 2019-05-27 ENCOUNTER — Other Ambulatory Visit: Payer: Self-pay

## 2019-05-27 ENCOUNTER — Encounter: Payer: Self-pay | Admitting: Orthopaedic Surgery

## 2019-05-27 ENCOUNTER — Ambulatory Visit: Payer: Self-pay | Admitting: Orthopaedic Surgery

## 2019-05-27 ENCOUNTER — Ambulatory Visit (INDEPENDENT_AMBULATORY_CARE_PROVIDER_SITE_OTHER): Payer: Self-pay | Admitting: Orthopaedic Surgery

## 2019-05-27 VITALS — Ht 63.0 in | Wt 154.0 lb

## 2019-05-27 DIAGNOSIS — S83252D Bucket-handle tear of lateral meniscus, current injury, left knee, subsequent encounter: Secondary | ICD-10-CM

## 2019-05-27 MED ORDER — TRAZODONE HCL 50 MG PO TABS: 50.0000 mg | ORAL_TABLET | Freq: Every day | ORAL | 1 refills | Status: DC
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
## 2019-05-27 NOTE — Telephone Encounter (Signed)
disregard

## 2019-06-24 ENCOUNTER — Other Ambulatory Visit: Payer: Self-pay

## 2019-06-24 ENCOUNTER — Ambulatory Visit (INDEPENDENT_AMBULATORY_CARE_PROVIDER_SITE_OTHER): Payer: Self-pay | Admitting: Orthopaedic Surgery

## 2019-06-24 ENCOUNTER — Encounter: Payer: Self-pay | Admitting: Orthopaedic Surgery

## 2019-06-24 VITALS — Ht 63.0 in | Wt 150.0 lb

## 2019-06-24 DIAGNOSIS — M25562 Pain in left knee: Secondary | ICD-10-CM

## 2019-06-24 DIAGNOSIS — M25462 Effusion, left knee: Secondary | ICD-10-CM | POA: Insufficient documentation

## 2019-06-24 NOTE — Progress Notes (Signed)
Office Visit Note   Patient: Katie Benson           Date of Birth: [birth year]           MRN: [id number] Visit Date: 06/24/2019              Requested by: Charlott Rakes, MD [place],  [place] [id number] PCP: Charlott Rakes, MD   Assessment & Plan: Visit Diagnoses:  1. Effusion, left knee   2. Left knee pain, unspecified chronicity     Plan:  #1: At this time are going to order an MRI scan to delineate pathology in the knee.  Certainly with her effusion and her pain and previous surgery she may have something more intra-articular causing her pain and swelling.  She will follow back up after her MRI scan.  Follow-Up Instructions: Return for follow up after MRI scan.   Orders:  Orders Placed This Encounter  Procedures  . MR Knee Left w/o contrast   No orders of the defined types were placed in this encounter.     Procedures: No procedures performed   Clinical Data: No additional findings.   Subjective: Chief Complaint  Patient presents with  . Left Knee - Pain, Follow-up  Patient presents today for follow up on her left knee. She had a left knee arthroscopy on 12/24/2018. She said that it may be a little worse since her last visit. She said that it is swelling more, but doing less activity.   HPI  Review of Systems  Constitutional: Positive for fatigue.  HENT: Negative for ear pain.   Eyes: Negative for pain.  Respiratory: Negative for shortness of breath.   Cardiovascular: Negative for leg swelling.  Gastrointestinal: Negative for constipation and diarrhea.  Endocrine: Negative for cold intolerance and heat intolerance.  Genitourinary: Negative for difficulty urinating.  Musculoskeletal: Positive for joint swelling.  Skin: Negative for rash.  Allergic/Immunologic: Negative for food allergies.  Neurological: Positive for weakness.  Hematological: Does not bruise/bleed easily.  Psychiatric/Behavioral: Negative for sleep  disturbance.     Objective: Vital Signs: Ht 5\' 3"  (1.6 m)   Wt 150 lb (68 kg)   BMI 26.57 kg/m   Physical Exam Constitutional:      Appearance: Normal appearance. She is well-developed.  HENT:     Head: Normocephalic.  Eyes:     Pupils: Pupils are equal, round, and reactive to light.  Pulmonary:     Effort: Pulmonary effort is normal.  Skin:    General: Skin is warm and dry.  Neurological:     Mental Status: She is alert and oriented to person, place, and time.  Psychiatric:        Behavior: Behavior normal.     Ortho Exam  Exam today reveals tenderness to palpation about the lateral joint line.  Some medial joint line pain is noted.  She does have some tenderness over the pes bursal area but this appears to be better than she was prior to her injection.  Range of motion near full extension to about 95 degrees.  Smooth motion of the hip without crepitance or discomfort.  She does have some patellofemoral crepitance with range of motion.  She does have a large effusion but not tense or warm.  Specialty Comments:  No specialty comments available.  Imaging: No results found.   PMFS History: Current Outpatient Medications  Medication Sig Dispense Refill  . HYDROcodone-acetaminophen (NORCO/VICODIN) 5-325 MG tablet Take 1-2 tablets by mouth every  4 (four) hours as needed for moderate pain or severe pain. 30 tablet 0  . HYDROcodone-acetaminophen (NORCO/VICODIN) 5-325 MG tablet Take 1 tablet by mouth every 6 (six) hours as needed for moderate pain. 30 tablet 0  . HYDROcodone-acetaminophen (NORCO/VICODIN) 5-325 MG tablet Take 1 tablet by mouth every 4 (four) hours as needed for moderate pain. 30 tablet 0  . HYDROcodone-acetaminophen (NORCO/VICODIN) 5-325 MG tablet Take 1 tablet by mouth every 6 (six) hours as needed for moderate pain. 30 tablet 0  . HYDROcodone-acetaminophen (NORCO/VICODIN) 5-325 MG tablet Take 1 tablet by mouth every 6 (six) hours as needed for moderate pain. 30  tablet 0  . HYDROcodone-acetaminophen (NORCO/VICODIN) 5-325 MG tablet Take 1 tablet by mouth every 6 (six) hours as needed for moderate pain. 30 tablet 0  . hydrOXYzine (ATARAX/VISTARIL) 50 MG tablet Take 1-2 tablets (50-100 mg total) by mouth every 8 (eight) hours as needed for itching. 60 tablet 2  . megestrol (MEGACE) 40 MG tablet TAKE 1 TABLET(40 MG) BY MOUTH TWICE DAILY 60 tablet 4  . meloxicam (MOBIC) 7.5 MG tablet Take 1 tablet (7.5 mg total) by mouth daily. 30 tablet 1  . Menthol-Methyl Salicylate (MUSCLE RUB) 10-15 % CREA Apply 1 application topically as needed for muscle pain.    . naproxen sodium (ALEVE) 220 MG tablet Take 220 mg by mouth 2 (two) times daily as needed (pain).     Marland Kitchen oxyCODONE (ROXICODONE) 5 MG immediate release tablet Take 1-2 tablets (5-10 mg total) by mouth every 4 (four) hours as needed. 30 tablet 0  . traZODone (DESYREL) 50 MG tablet Take 1 tablet (50 mg total) by mouth at bedtime. 30 tablet 1  . pantoprazole (PROTONIX) 40 MG tablet Take 1 tablet (40 mg total) by mouth 2 (two) times daily. 60 tablet 11   No current facility-administered medications for this visit.    Patient Active Problem List   Diagnosis Date Noted  . Effusion, left knee 06/24/2019  . Pes anserine bursitis 04/29/2019  . Bucket handle tear of lateral meniscus of left knee 12/12/2018  . Ganglion cyst 12/12/2018  . Pain in left knee 11/15/2018  . Fibroids 03/18/2018  . S/P parathyroidectomy 02/12/2018  . Primary hyperparathyroidism ([place]) 01/08/2018  . Hyperparathyroidism , secondary, non-renal ([place]) 01/08/2018  . Fibroid uterus 12/06/2017  . Hypercalcemia 12/06/2017  . Fibroid 10/15/2017  . Abnormal uterine bleeding (AUB) 08/20/2017  . Anemia 08/20/2017   Past Medical History:  Diagnosis Date  . Anemia   . Fibroids   . GERD (gastroesophageal reflux disease)   . History of kidney stones   . Neck injury     Family History  Problem Relation Age of Onset  . Diabetes Sister   .  Cancer Maternal Grandmother        ovarian  . Hypercalcemia Mother     Past Surgical History:  Procedure Laterality Date  . CESAREAN SECTION    . IR ANGIOGRAM PELVIS SELECTIVE OR SUPRASELECTIVE  03/18/2018  . IR ANGIOGRAM PELVIS SELECTIVE OR SUPRASELECTIVE  03/18/2018  . IR ANGIOGRAM SELECTIVE EACH ADDITIONAL VESSEL  03/18/2018  . IR ANGIOGRAM SELECTIVE EACH ADDITIONAL VESSEL  03/18/2018  . IR EMBO TUMOR ORGAN ISCHEMIA INFARCT INC GUIDE ROADMAPPING  03/18/2018  . IR RADIOLOGIST EVAL & MGMT  10/17/2017  . IR RADIOLOGIST EVAL & MGMT  12/20/2017  . IR RADIOLOGIST EVAL & MGMT  04/17/2018  . IR US GUIDE VASC ACCESS RIGHT  03/18/2018  . KNEE ARTHROSCOPY WITH LATERAL MENISECTOMY Left 12/24/2018  Procedure: LEFT KNEE ARTHROSCOPY WITH LATERAL MENISECTOM, debriment of GANGLION CYST;  Surgeon: [name], MD;  Location: [facility];  Service: Orthopedics;  Laterality: Left;  . PARATHYROIDECTOMY N/A 02/12/2018   Procedure: PARATHYROIDECTOMY;  Surgeon: Fredirick Maudlin, MD;  Location: [facility];  Service: General;  Laterality: N/A;  . WISDOM TOOTH EXTRACTION     age 79   Social History   Occupational History  . Not on file  Tobacco Use  . Smoking status: Current Every Day Smoker    Packs/day: 0.25    Years: 14.00    Pack years: 3.50    Types: Cigarettes  . Smokeless tobacco: Never Used  Substance and Sexual Activity  . Alcohol use: Yes    Comment: occassionally on weekends  . Drug use: Yes    Types: Marijuana    Comment: last used 12/14/2018-marijuana  . Sexual activity: Yes    Birth control/protection: Condom, None

## 2019-07-31 ENCOUNTER — Telehealth: Payer: Self-pay

## 2019-07-31 NOTE — Telephone Encounter (Signed)
Called and spoke with patient. We received a refill request for Trazodone from [facility]. I called patient to let her know that Aaron Edelman wants her to get this medication from her PCP. She immediately got upset and ask if she needed to be seen somewhere else for everything? I explained that we do not dispense medication for long term sleep issues. We only dispense them immediately after surgery if needed, but not for chronic use. She then said she will go elsewhere and hung up while I was midsentence.  She also mentioned early in the conversation that she is still waiting for her MRI to be approved because she was notified it was denied, but Aaron Edelman has not been made aware of that.

## 2019-08-09 IMAGING — DX DG KNEE COMPLETE 4+V*L*
4 series · 4 of 4 positions shown · non-contrast
Comparison: None.

CLINICAL DATA: Left knee pain

EXAM:
LEFT KNEE - COMPLETE 4+ VIEW

[knee standing ap]
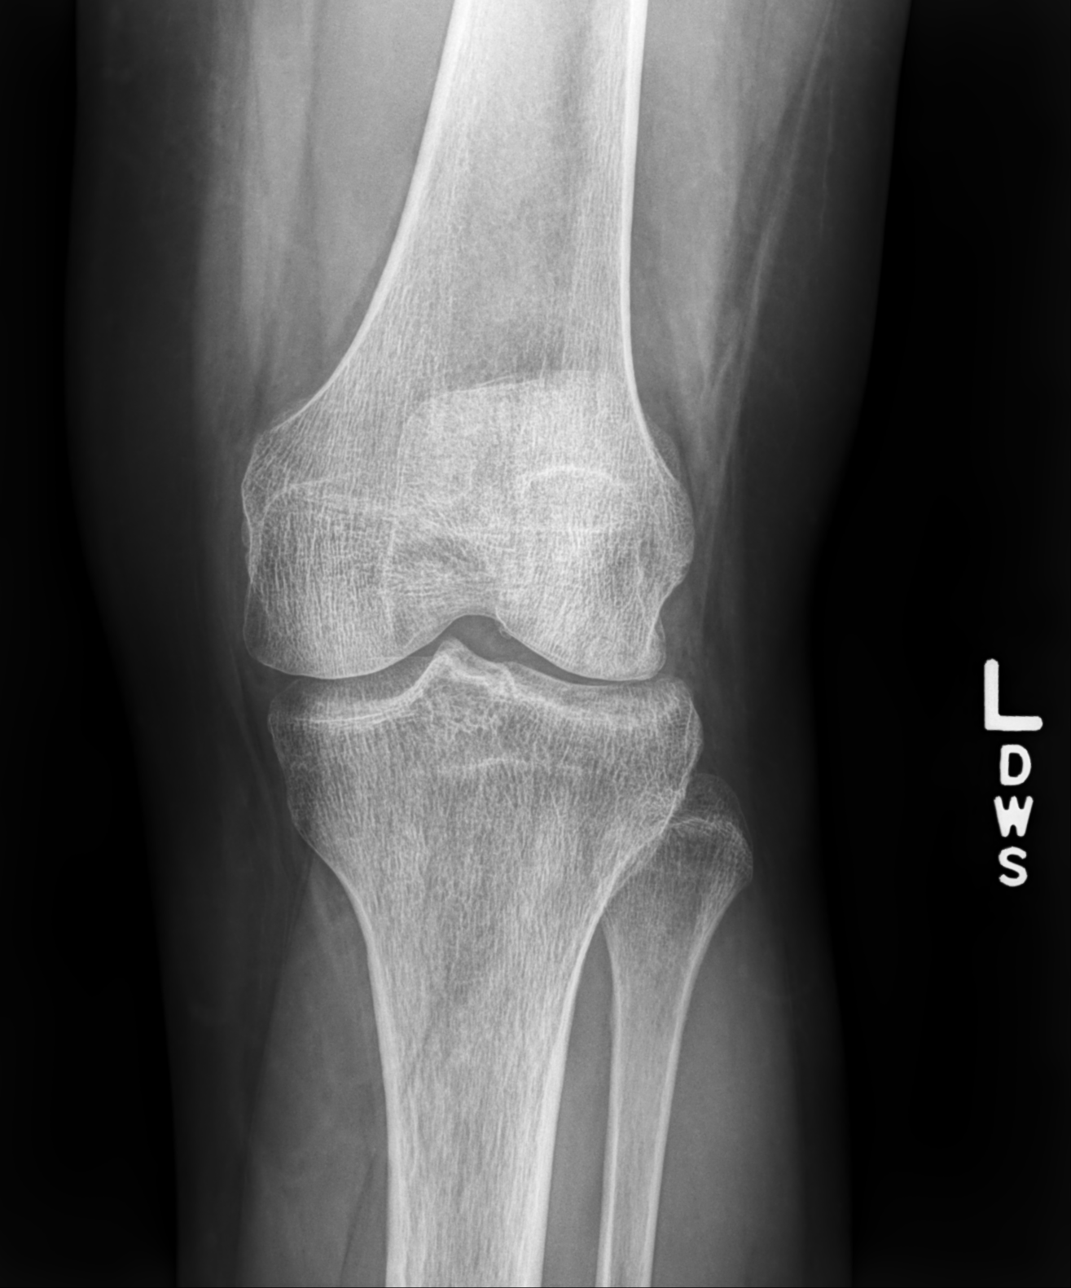

[knee standing lat]
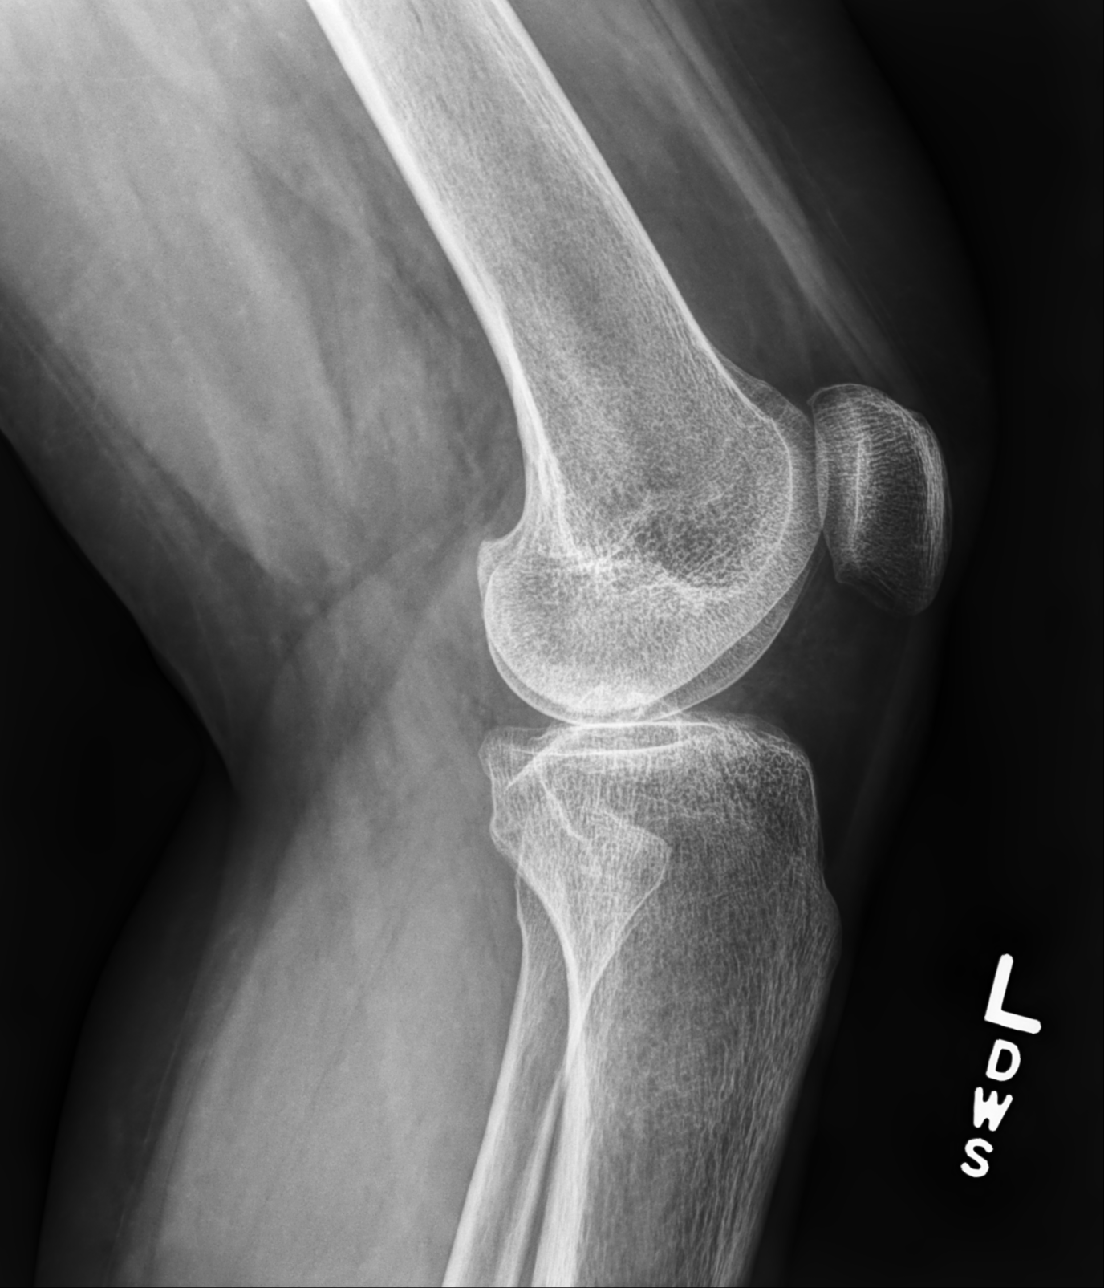

[knee [person_name] view pa]
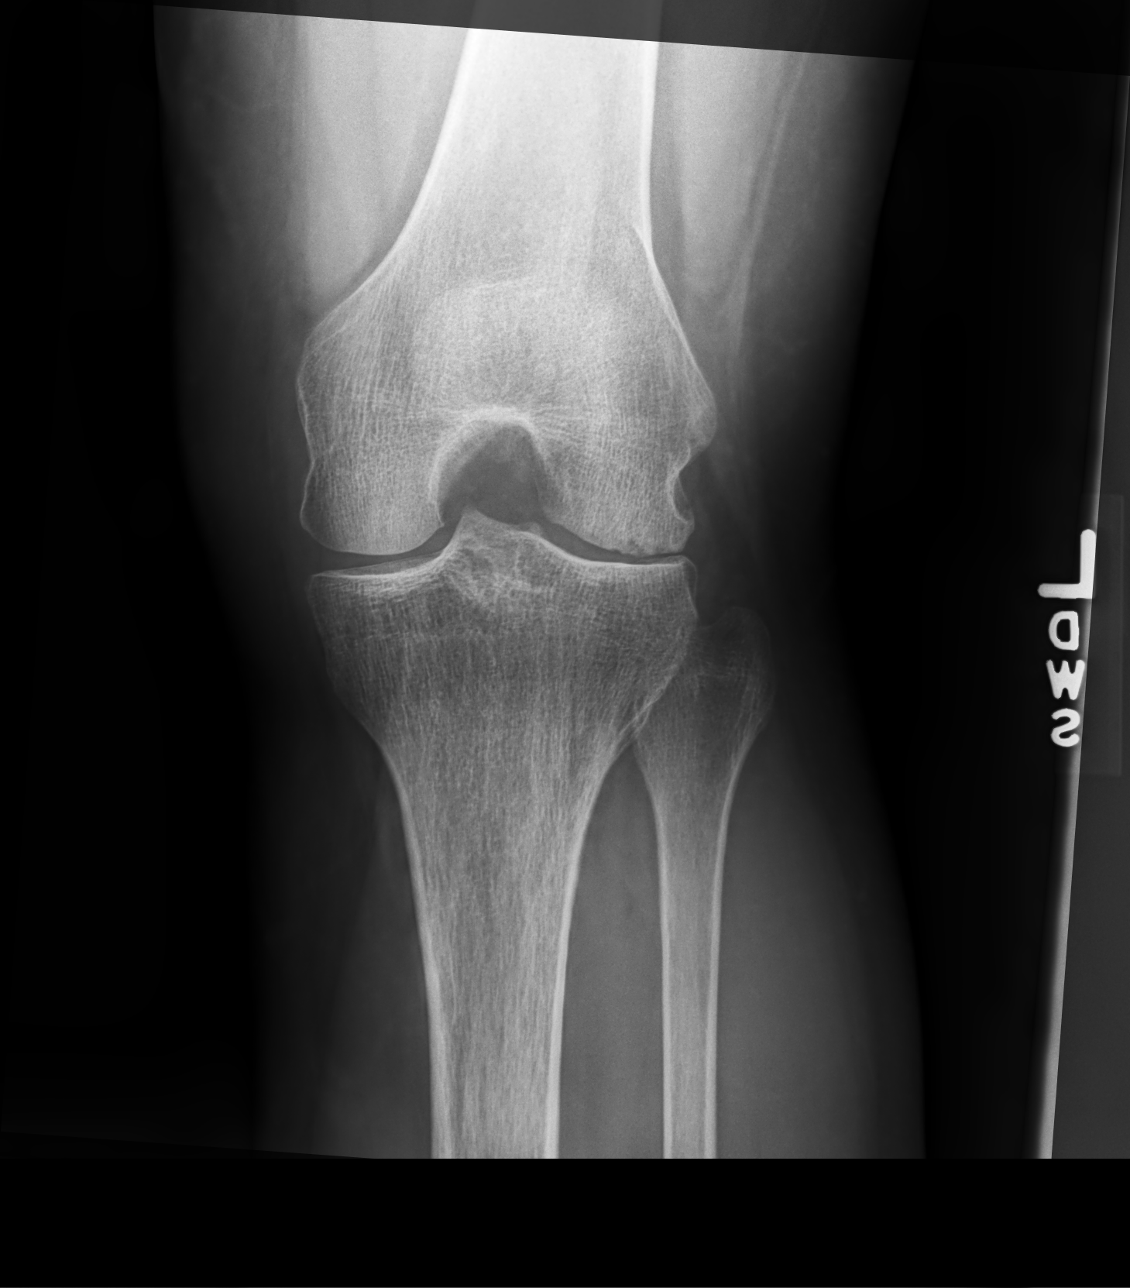

[patella [id]]
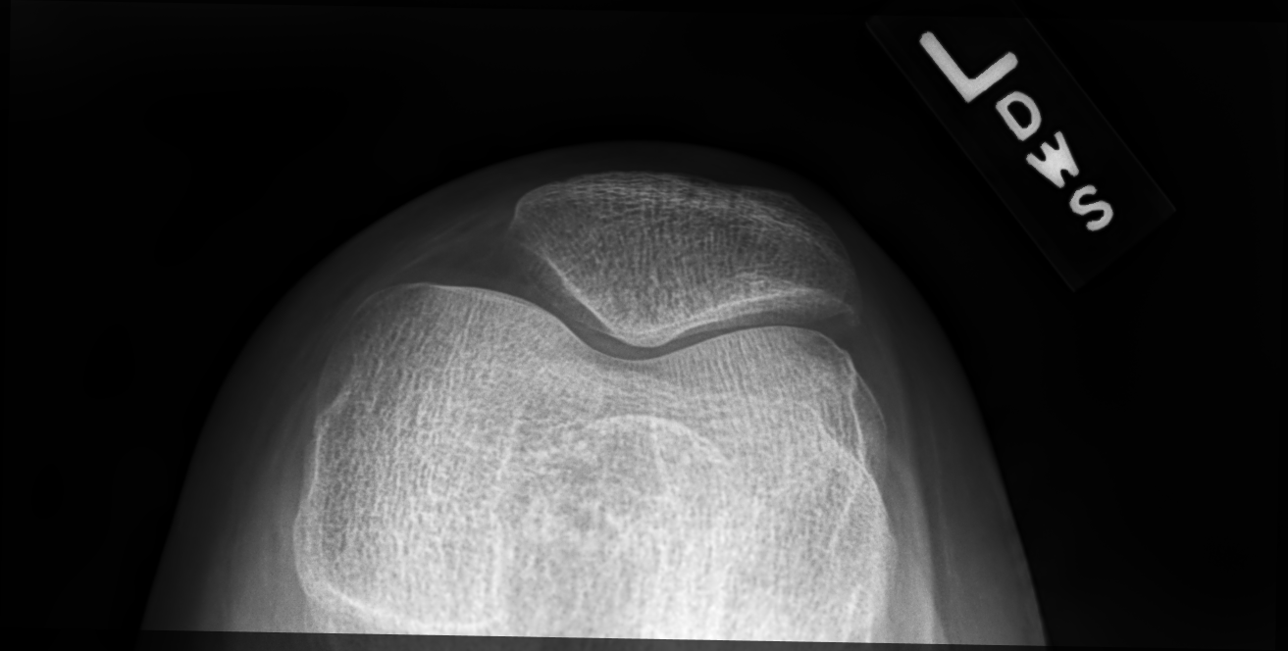

[4 of 4 positions shown; findings below may reference images not displayed]

FINDINGS: Joint space narrowing and early spurring in the lateral and medial
compartments. No acute bony abnormality. Specifically, no fracture,
subluxation, or dislocation. No joint effusion.
IMPRESSION: Mild osteoarthritis as above, most pronounced in the lateral
compartment. No acute bony abnormality.

## 2019-08-30 IMAGING — XA IR EMBO TUMOR ORGAN ISCHEMIA INFARCT INC GUIDE ROADMAPPING
10 series · 13 of 24 positions shown · IV contrast (IODINE)
Comparison: none

INDICATION: Symptomatic uterine fibroids.  Menorrhagia.

[Series 1: care single · 1 of 1 slices shown]
[im 1/1]
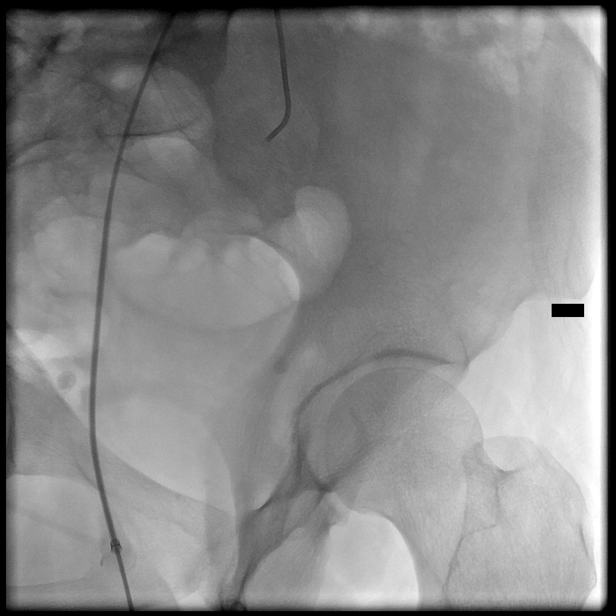

[Series 2: care body 4 · 1 of 23 frames shown (1 of 8)]
[frame 20/23]
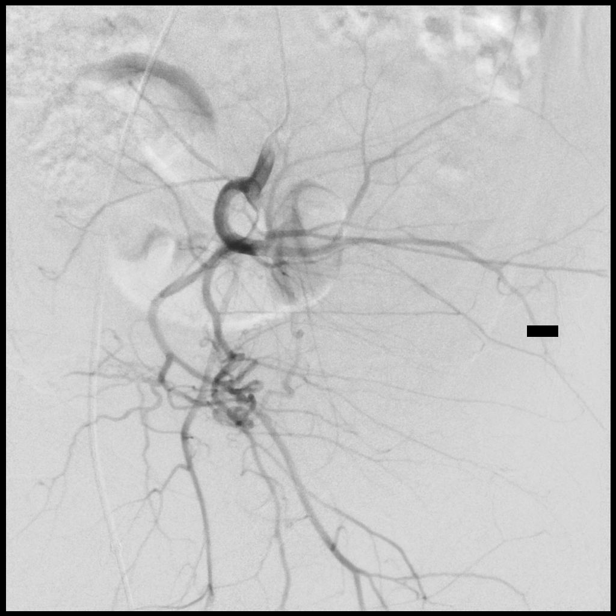

[Series 3: care body 4 · 1 of 50 frames shown (2 of 8)]
[frame 26/50]
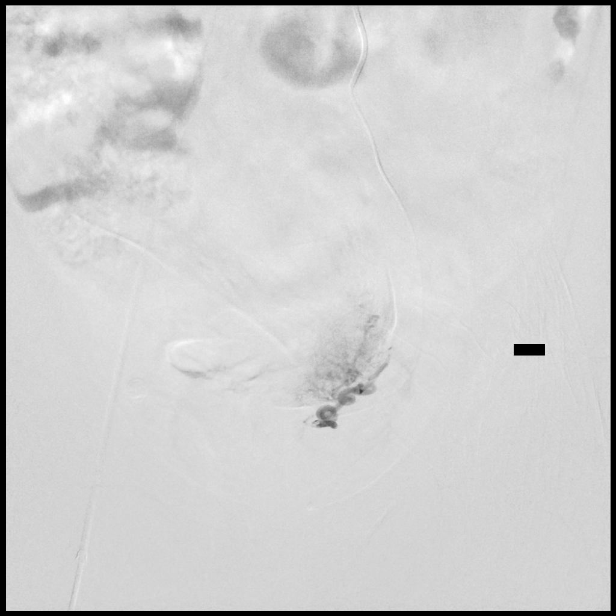

[Series 4: care body 4 · 2 of 15 frames shown (3 of 8)]
[frame 3/15]
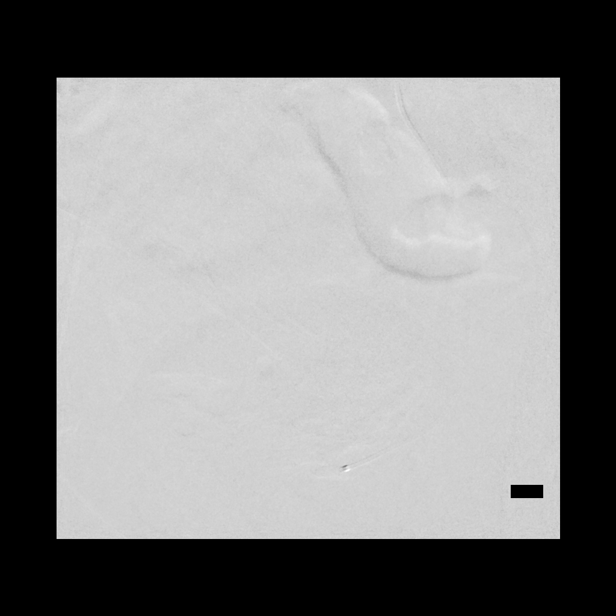
[frame 14/15]
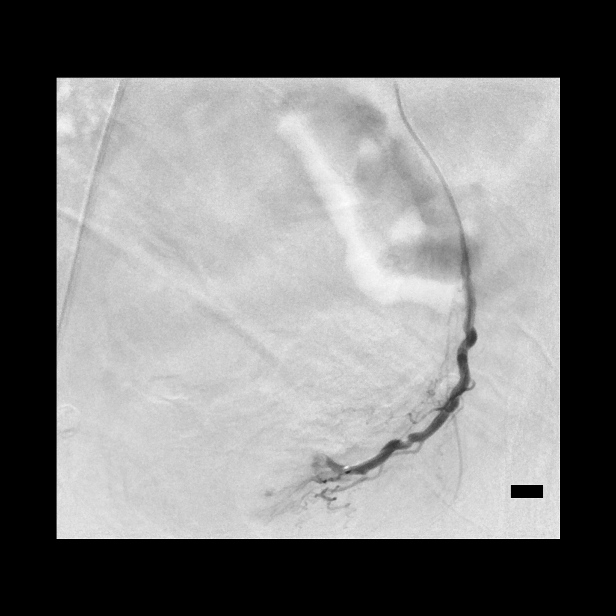

[Series 5: care body 4 · 1 of 21 frames shown (4 of 8)]
[frame 11/21]
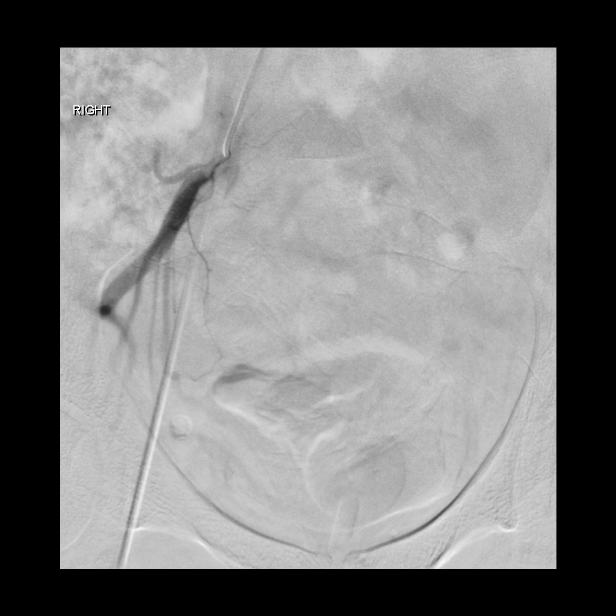

[Series 6: care body 4 · 2 of 22 frames shown (5 of 8)]
[frame 12/22]
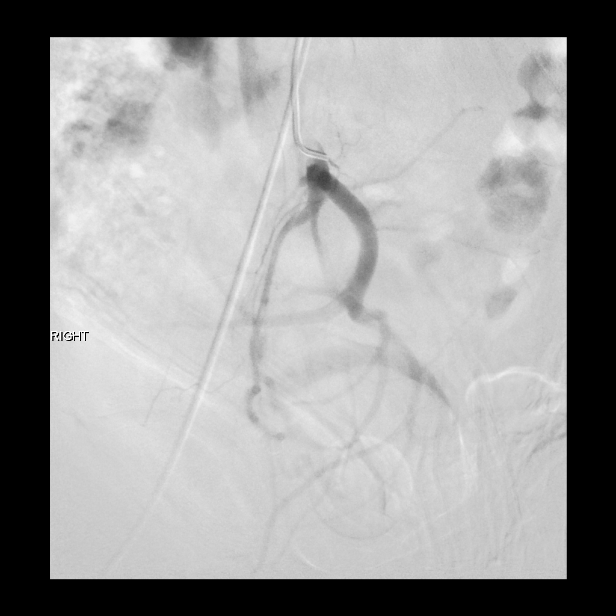
[frame 19/22]
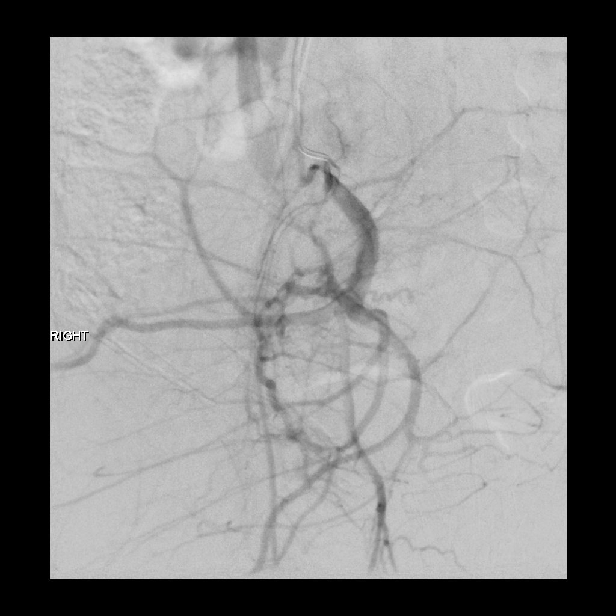

[Series 7: care body 4 · 1 of 19 frames shown (6 of 8)]
[frame 14/19]
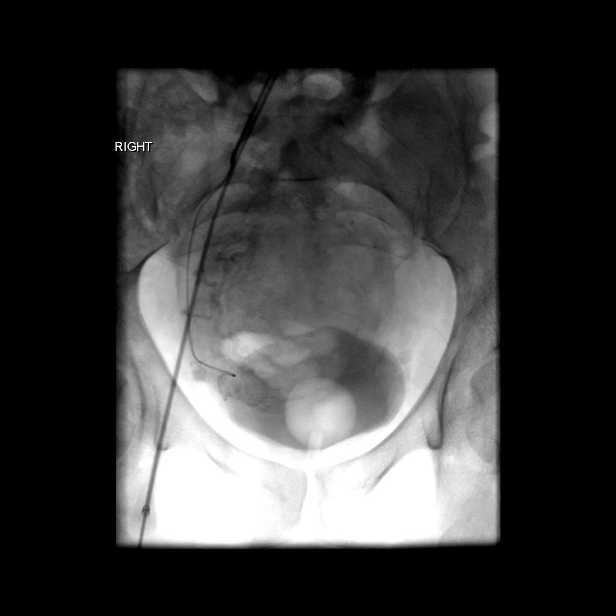

[Series 8: care body 4 · 1 of 16 frames shown (7 of 8)]
[frame 9/16]
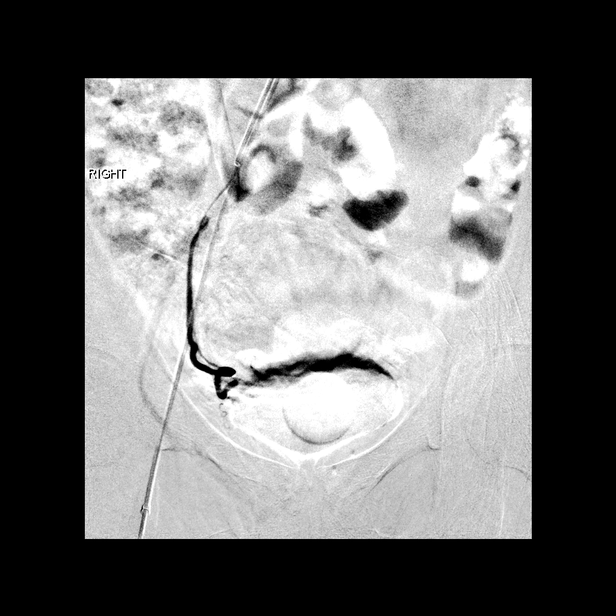

[Series 9: care body 4 · 2 of 8 frames shown (8 of 8)]
[frame 2/8]
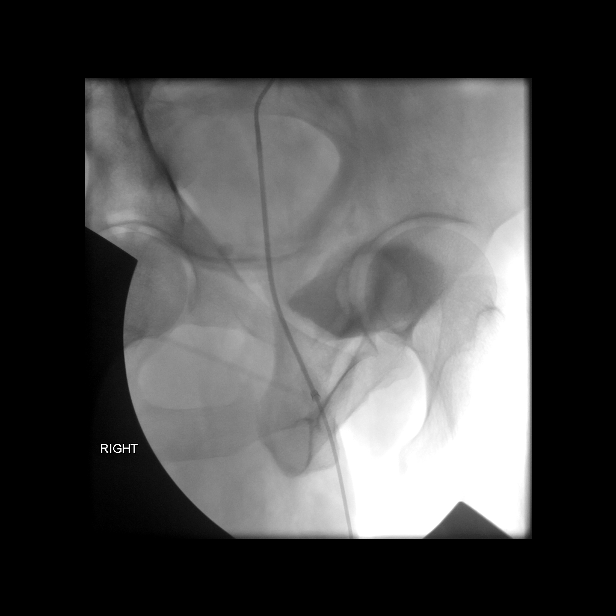
[frame 8/8]
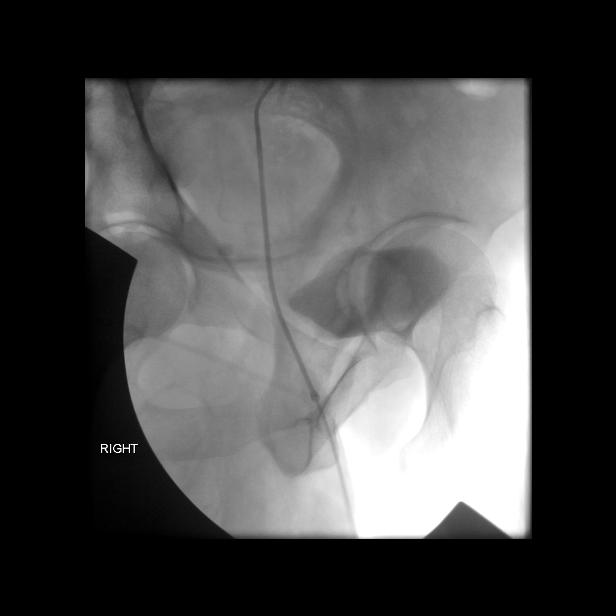

[Series 300: ir embo tumor organ ischemia infarct inc · 1 of 3 slices shown]
[im 3/3]
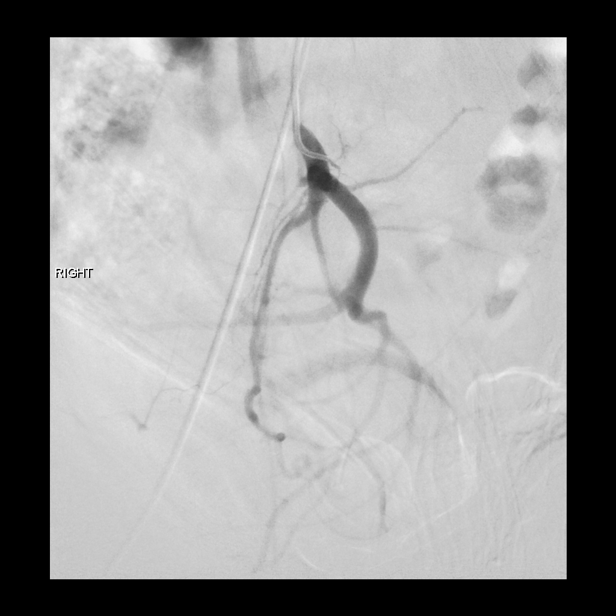

[13 of 24 positions shown; findings below may reference images not displayed]

EXAM:
UTERINE ARTERY EMBOLIZATION

MEDICATIONS:
Ancef 2 g. The antibiotic was administered within 1 hour of the
procedure

ANESTHESIA/SEDATION:
Fentanyl 200 mcg IV; Versed 4 mg IV

Moderate Sedation Time:  64 minutes

The patient was continuously monitored during the procedure by the
interventional radiology nurse under my direct supervision.

CONTRAST:  14mL OMNIPAQUE IOHEXOL 300 MG/ML SOLN, 50mL OMNIPAQUE
IOHEXOL 300 MG/ML SOLN, 23mL OMNIPAQUE IOHEXOL 300 MG/ML SOLN

FLUOROSCOPY TIME:  Fluoroscopy Time: 17 minutes 42 seconds (618
mGy).

COMPLICATIONS:
None immediate.



Vessels selected:  Bilateral third order iliac artery.

The right groin was prepped with ChloraPrep in a sterile fashion,
and a sterile drape was applied covering the operative field. A
sterile gown and sterile gloves were used for the procedure.

A micropuncture needle was inserted into the right common femoral
artery and removed over a 018 wire which was up sized to Javiara Mericah. A
5-French sheath was inserted. Cobra II catheter was advanced into
the aorta. The contralateral left common iliac artery was selected.
The internal iliac artery on the left was selected. Angiography was
performed. A micro catheter was advanced over an 018 glide wire into
the left uterine artery. Angiography was performed. 100 mcg
nitroglycerin was injected into the left uterine artery.

Embolization was performed utilizing 1 vial 500-700 micron
embospheres.

The micro catheter was removed and flushed. Pia Mandal loop was
created. The ipsilateral right common iliac artery and right
internal iliac artery were selected. Angiography was performed. A
micro catheter was advanced over a 1166 glide wire into the right
uterine artery. Angiography was performed. 100 mcg of nitroglycerin
was injected into the right uterine artery.

Embolization was again performed with 1 vial 500-700 micron
embospheres.

The micro catheter and Cobra catheter were removed. Right femoral
angiography was performed. An ExoSeal device was deployed without
complication and hemostasis was achieved.
FINDINGS: Injecting the internal iliac arteries demonstrates bilateral pelvic
anatomy and bilateral uterine artery angiography. Expected anatomy
is identified. Injecting the bilateral uterine arteries demonstrates
no supply from the uterine artery to the vagina.

Post embolization angiography demonstrates relative stasis of flow
and pruning of the vasculature to the uterus.
IMPRESSION: Successful bilateral uterine artery embolization.

## 2019-09-17 ENCOUNTER — Telehealth (INDEPENDENT_AMBULATORY_CARE_PROVIDER_SITE_OTHER): Payer: Self-pay | Admitting: Internal Medicine

## 2019-09-17 ENCOUNTER — Encounter: Payer: Self-pay | Admitting: Internal Medicine

## 2019-09-17 DIAGNOSIS — G4700 Insomnia, unspecified: Secondary | ICD-10-CM

## 2019-09-17 DIAGNOSIS — G8929 Other chronic pain: Secondary | ICD-10-CM

## 2019-09-17 DIAGNOSIS — M25562 Pain in left knee: Secondary | ICD-10-CM

## 2019-09-17 DIAGNOSIS — Z9889 Other specified postprocedural states: Secondary | ICD-10-CM

## 2019-09-17 MED ORDER — HYDROXYZINE HCL 50 MG PO TABS: 50.0000 mg | ORAL_TABLET | Freq: Three times a day (TID) | ORAL | 2 refills | Status: DC | PRN

## 2019-09-17 NOTE — Progress Notes (Signed)
Virtual Visit via Telephone Note  I connected with Katie Benson, on 09/17/2019 at 10:47 AM by telephone due to the COVID-19 pandemic and verified that I am speaking with the correct person using two identifiers.   Consent: I discussed the limitations, risks, security and privacy concerns of performing an evaluation and management service by telephone and the availability of in person appointments. I also discussed with the patient that there may be a patient responsible charge related to this service. The patient expressed understanding and agreed to proceed.   Location of Patient: Home   Location of Provider: Clinic    Persons participating in Telemedicine visit: Amorie Rentz [facility] Dr. Juleen China    History of Present Illness: Patient has a visit for concern about left knee pain. Reports that she had surgery in Oct 2020 with knee arthroscopy with lateral meniscectomy. Knee has been swollen since surgery. Having significant amount of pain that wakes her up at night. Feels like knee is about to give way a lot of the time. Completed PT over the course of 3 months---thinks this was started too early after her surgery. Does not want to see the same orthopedic surgeon as she saw before.    Past Medical History:  Diagnosis Date   Anemia    Fibroids    GERD (gastroesophageal reflux disease)    History of kidney stones    Neck injury    Allergies  Allergen Reactions   Pork-Derived Products Hives and Swelling    No current outpatient medications on file prior to visit.   No current facility-administered medications on file prior to visit.    Observations/Objective: NAD. Speaking clearly.  Work of breathing normal.  Alert and oriented. Mood appropriate.   Assessment and Plan: 1. Chronic pain of left knee 2. S/P meniscectomy Chronic left knee pain and edema after knee arthroscopy and lateral menisectomy. Has completed PT. Will place referral for  second opinion with another orthopedist.  - AMB referral to orthopedics  3. Insomnia, unspecified type Related to pain. Has tried Trazodone in past without improvement in sleep. Hydroxyzine has worked well. Will refill this medication.  - hydrOXYzine (ATARAX/VISTARIL) 50 MG tablet; Take 1-2 tablets (50-100 mg total) by mouth every 8 (eight) hours as needed.  Dispense: 60 tablet; Refill: 2    Follow Up Instructions: PRN and for routine medical care    I discussed the assessment and treatment plan with the patient. The patient was provided an opportunity to ask questions and all were answered. The patient agreed with the plan and demonstrated an understanding of the instructions.   The patient was advised to call back or seek an in-person evaluation if the symptoms worsen or if the condition fails to improve as anticipated.     I provided 14 minutes total of non-face-to-face time during this encounter including median intraservice time, reviewing previous notes, investigations, ordering medications, medical decision making, coordinating care and patient verbalized understanding at the end of the visit.    Phill Myron, D.O. Primary [facility] at [facility]  09/17/2019, 10:47 AM

## 2020-01-09 ENCOUNTER — Encounter (HOSPITAL_COMMUNITY): Payer: Self-pay | Admitting: Emergency Medicine

## 2020-01-09 ENCOUNTER — Emergency Department (HOSPITAL_COMMUNITY): Payer: Self-pay

## 2020-01-09 ENCOUNTER — Other Ambulatory Visit (HOSPITAL_COMMUNITY): Payer: Self-pay | Admitting: Emergency Medicine

## 2020-01-09 ENCOUNTER — Emergency Department (HOSPITAL_COMMUNITY)
Admission: EM | Admit: 2020-01-09 | Discharge: 2020-01-09 | Disposition: A | Discharge status: Home / Self Care | Payer: Self-pay | Attending: Emergency Medicine | Admitting: Emergency Medicine

## 2020-01-09 ENCOUNTER — Other Ambulatory Visit: Payer: Self-pay

## 2020-01-09 DIAGNOSIS — M25562 Pain in left knee: Secondary | ICD-10-CM | POA: Insufficient documentation

## 2020-01-09 DIAGNOSIS — F17210 Nicotine dependence, cigarettes, uncomplicated: Secondary | ICD-10-CM | POA: Insufficient documentation

## 2020-01-09 DIAGNOSIS — Z96652 Presence of left artificial knee joint: Secondary | ICD-10-CM | POA: Insufficient documentation

## 2020-01-09 MED ORDER — DICLOFENAC SODIUM 1 % EX GEL
2.0000 g | Freq: Once | CUTANEOUS | Status: DC
  Filled 2020-01-09: qty 100

## 2020-01-09 MED ORDER — PREDNISONE 20 MG PO TABS
60.0000 mg | ORAL_TABLET | Freq: Once | ORAL | Status: AC
  Administered 2020-01-09: 60 mg via ORAL
  Filled 2020-01-09: qty 3

## 2020-01-09 MED ORDER — KETOROLAC TROMETHAMINE 30 MG/ML IJ SOLN
15.0000 mg | Freq: Once | INTRAMUSCULAR | Status: AC
  Administered 2020-01-09: 15 mg via INTRAMUSCULAR
  Filled 2020-01-09: qty 1

## 2020-01-09 MED ORDER — PREDNISONE 20 MG PO TABS: 40.0000 mg | ORAL_TABLET | Freq: Every day | ORAL | 0 refills | Status: DC

## 2020-01-09 MED FILL — predniSONE 20 MG TABS: 20 | 4 days supply | Qty: 8 | Fill #0

## 2020-01-09 NOTE — Discharge Instructions (Addendum)
As discussed, your evaluation today has been largely reassuring.  But, it is important that you monitor your condition carefully, and do not hesitate to return to the ED if you develop new, or concerning changes in your condition.  Use the provided cream twice daily for the next week.  Please follow-up with our orthopedic physicians for appropriate ongoing care.   Please consider applying for Medicaid at [place] in person or online at [email or web address]  [address] [place], [place] [address]  [facility] [address] [place], [place] [address]

## 2020-01-09 NOTE — ED Notes (Signed)
Pt requesting information on financial assistance. Write discussed patients concerns with case management and information to be included on patient's discharge instructions. Pt verbalizes understanding.

## 2020-01-09 NOTE — ED Notes (Signed)
DC instructions reviewed with pt. Pt verbalized understanding.  Pt DC 

## 2020-01-09 NOTE — ED Provider Notes (Signed)
[facility] EMERGENCY DEPARTMENT Provider Note   CSN: [id number] Arrival date & time: 01/09/20  1130     History Chief Complaint  Patient presents with  . Knee Pain    Katie Benson is a 39 y.o. female.  HPI Adult female with left knee pain.  Patient has a history of knee injury about 18 months ago, surgery 13 months ago.  She notes no follow-up with her surgeon.  Now, over the past few days, as she has run out of medication prescribed following surgery she has had pain about the knee, left lateral, frontal, worse with motion, ambulation.  Minimal associated swelling, no distal pain, no proximal pain.  Pain is sore, severe, not improved with anything, worse with motion as above. No systemic complaints.   Past Medical History:  Diagnosis Date  . Anemia   . Fibroids   . GERD (gastroesophageal reflux disease)   . History of kidney stones   . Neck injury     Patient Active Problem List   Diagnosis Date Noted  . Effusion, left knee 06/24/2019  . Pes anserine bursitis 04/29/2019  . Bucket handle tear of lateral meniscus of left knee 12/12/2018  . Ganglion cyst 12/12/2018  . Pain in left knee 11/15/2018  . Fibroids 03/18/2018  . S/P parathyroidectomy ([place]) 02/12/2018  . Primary hyperparathyroidism ([place]) 01/08/2018  . Hyperparathyroidism , secondary, non-renal ([place]) 01/08/2018  . Fibroid uterus 12/06/2017  . Hypercalcemia 12/06/2017  . Fibroid 10/15/2017  . Abnormal uterine bleeding (AUB) 08/20/2017  . Anemia 08/20/2017    Past Surgical History:  Procedure Laterality Date  . CESAREAN SECTION    . IR ANGIOGRAM PELVIS SELECTIVE OR SUPRASELECTIVE  03/18/2018  . IR ANGIOGRAM PELVIS SELECTIVE OR SUPRASELECTIVE  03/18/2018  . IR ANGIOGRAM SELECTIVE EACH ADDITIONAL VESSEL  03/18/2018  . IR ANGIOGRAM SELECTIVE EACH ADDITIONAL VESSEL  03/18/2018  . IR EMBO TUMOR ORGAN ISCHEMIA INFARCT INC GUIDE ROADMAPPING  03/18/2018  . IR RADIOLOGIST EVAL & MGMT   10/17/2017  . IR RADIOLOGIST EVAL & MGMT  12/20/2017  . IR RADIOLOGIST EVAL & MGMT  04/17/2018  . IR US GUIDE VASC ACCESS RIGHT  03/18/2018  . KNEE ARTHROSCOPY WITH LATERAL MENISECTOMY Left 12/24/2018   Procedure: LEFT KNEE ARTHROSCOPY WITH LATERAL MENISECTOM, debriment of GANGLION CYST;  Surgeon: [name], MD;  Location: [facility];  Service: Orthopedics;  Laterality: Left;  . PARATHYROIDECTOMY N/A 02/12/2018   Procedure: PARATHYROIDECTOMY;  Surgeon: Fredirick Maudlin, MD;  Location: [facility] ORS;  Service: General;  Laterality: N/A;  . WISDOM TOOTH EXTRACTION     age 83     OB History    Gravida  2   Para  1   Term  1   Preterm  0   AB  1   Living  1     SAB  1   TAB  0   Ectopic  0   Multiple  0   Live Births  1           Family History  Problem Relation Age of Onset  . Diabetes Sister   . Cancer Maternal Grandmother        ovarian  . Hypercalcemia Mother     Social History   Tobacco Use  . Smoking status: Current Every Day Smoker    Packs/day: 0.25    Years: 14.00    Pack years: 3.50    Types: Cigarettes  . Smokeless tobacco: Never Used  Vaping Use  .  Vaping Use: Never used  Substance Use Topics  . Alcohol use: Yes    Comment: occassionally on weekends  . Drug use: Yes    Types: Marijuana    Comment: last used 12/14/2018-marijuana    Home Medications Prior to Admission medications   Medication Sig Start Date End Date Taking? Authorizing Provider  hydrOXYzine (ATARAX/VISTARIL) 50 MG tablet Take 1-2 tablets (50-100 mg total) by mouth every 8 (eight) hours as needed. 09/17/19   Nicolette Bang, DO    Allergies    Pork-derived products  Review of Systems   Review of Systems  Constitutional:       Per HPI, otherwise negative  HENT:       Per HPI, otherwise negative  Respiratory:       Per HPI, otherwise negative  Cardiovascular:       Per HPI, otherwise negative  Gastrointestinal: Negative for vomiting.  Endocrine:        Negative aside from HPI  Genitourinary:       Neg aside from HPI   Musculoskeletal:       Per HPI, otherwise negative  Skin: Negative.   Neurological: Negative for syncope.    Physical Exam Updated Vital Signs BP 118/67   Pulse 71   Temp 98.4 F (36.9 C) (Oral)   Resp 16   Ht 5\' 3"  (1.6 m)   Wt 64.4 kg   LMP 01/09/2020   SpO2 98%   BMI 25.15 kg/m   Physical Exam Vitals and nursing note reviewed.  Constitutional:      General: She is not in acute distress.    Appearance: She is well-developed.  HENT:     Head: Normocephalic and atraumatic.  Eyes:     Conjunctiva/sclera: Conjunctivae normal.  Cardiovascular:     Rate and Rhythm: Normal rate and regular rhythm.  Pulmonary:     Effort: Pulmonary effort is normal. No respiratory distress.     Breath sounds: Normal breath sounds. No stridor.  Abdominal:     General: There is no distension.  Musculoskeletal:       Legs:  Skin:    General: Skin is warm and dry.  Neurological:     Mental Status: She is alert and oriented to person, place, and time.     Cranial Nerves: No cranial nerve deficit.     ED Results / Procedures / Treatments   Labs (all labs ordered are listed, but only abnormal results are displayed) Labs Reviewed - No data to display  EKG None  Radiology I reviewed the x-ray of the knee, agree with interpretation, no acute findings.   Procedures Procedures (including critical care time)  Medications Ordered in ED Medications  diclofenac Sodium (VOLTAREN) 1 % topical gel 2 g (has no administration in time range)  ketorolac (TORADOL) 30 MG/ML injection 15 mg (15 mg Intramuscular Given 01/09/20 1335)    ED Course  I have reviewed the triage vital signs and the nursing notes.  Pertinent labs & imaging results that were available during my care of the patient were reviewed by me and considered in my medical decision making (see chart for details).    MDM Rules/Calculators/A&P Adult female  presents with knee pain, now 1 year after surgery, with no new trauma, no physical exam findings consistent with septic arthritis, substantial effusion suggesting gout or pseudogout.  Patient is distally neurovascularly intact, is awake, alert, speaking clearly, afebrile.  Patient's x-ray reassuring, patient started on appropriate anti-inflammatories, will follow up with  her Psychologist, sport and exercise. Final Clinical Impression(s) / ED Diagnoses Final diagnoses:  Acute pain of left knee    Rx / DC Orders ED Discharge Orders    None       Carmin Muskrat, MD 01/09/20 1341

## 2020-01-09 NOTE — Progress Notes (Signed)
CSW spoke with Annie Main, RN regarding patient's request for financial resources - CSW added information for how to apply for Medicaid on patient's AVS.  Madilyn Fireman, MSW, LCSW-A Transitions of Care  Clinical Social Worker  [facility] Emergency Departments  Medical ICU [phone]

## 2020-01-09 NOTE — ED Triage Notes (Signed)
Pt states L knee pain and swelling, ongoing since having surgery last year. Was never able to get in to see surgeon for post op follow up. Denies any recent trauma.

## 2020-02-07 ENCOUNTER — Other Ambulatory Visit: Payer: Self-pay

## 2020-02-07 ENCOUNTER — Encounter (HOSPITAL_COMMUNITY): Payer: Self-pay | Admitting: *Deleted

## 2020-02-07 ENCOUNTER — Emergency Department (HOSPITAL_COMMUNITY): Payer: Self-pay

## 2020-02-07 ENCOUNTER — Emergency Department (HOSPITAL_COMMUNITY)
Admission: EM | Admit: 2020-02-07 | Discharge: 2020-02-07 | Disposition: A | Discharge status: Home / Self Care | Payer: Self-pay | Attending: Emergency Medicine | Admitting: Emergency Medicine

## 2020-02-07 DIAGNOSIS — S90129A Contusion of unspecified lesser toe(s) without damage to nail, initial encounter: Secondary | ICD-10-CM

## 2020-02-07 DIAGNOSIS — R52 Pain, unspecified: Secondary | ICD-10-CM

## 2020-02-07 DIAGNOSIS — F17210 Nicotine dependence, cigarettes, uncomplicated: Secondary | ICD-10-CM | POA: Insufficient documentation

## 2020-02-07 DIAGNOSIS — Y92007 Garden or yard of unspecified non-institutional (private) residence as the place of occurrence of the external cause: Secondary | ICD-10-CM | POA: Insufficient documentation

## 2020-02-07 DIAGNOSIS — S90121A Contusion of right lesser toe(s) without damage to nail, initial encounter: Secondary | ICD-10-CM | POA: Insufficient documentation

## 2020-02-07 DIAGNOSIS — W228XXA Striking against or struck by other objects, initial encounter: Secondary | ICD-10-CM | POA: Insufficient documentation

## 2020-02-07 DIAGNOSIS — Y9302 Activity, running: Secondary | ICD-10-CM | POA: Insufficient documentation

## 2020-02-07 NOTE — Discharge Instructions (Signed)
Apply ice to the foot over a towel.  Elevate your foot above your heart.  You may return to walking normally when your pain is improved.  Take Tylenol for pain.  Follow-up with your primary care physician or return for any new or worsening symptoms.

## 2020-02-07 NOTE — Progress Notes (Signed)
Orthopedic Tech Progress Note Patient Details:  Katie Benson Mar 07, 1980 [id number]  Ortho Devices Ortho Device/Splint Location: applied post op shoe to RLE and crutches Ortho Device/Splint Interventions: Ordered,Application,Adjustment   Post Interventions Patient Tolerated: Well Instructions Provided: Care of device   Braulio Bosch 02/07/2020, 12:36 PM

## 2020-02-07 NOTE — ED Provider Notes (Signed)
[facility] DEPT Provider Note   CSN: [id number] Arrival date & time: 02/07/20  1052     History Chief Complaint  Patient presents with  . Toe Injury    Katie Benson is a 39 y.o. female who presents emergency department for toe injury.  Patient states that she was running out in her backyard to stop her dog from doing something she did not want him to do and she hit her right toe against a 2 x 4.  She had immediate severe pain in the right right toe.  She has been able to ambulate but states that it hurts significantly.  She has bruising there.  She denies numbness or tingling.  HPI     Past Medical History:  Diagnosis Date  . Anemia   . Fibroids   . GERD (gastroesophageal reflux disease)   . History of kidney stones   . Neck injury     Patient Active Problem List   Diagnosis Date Noted  . Effusion, left knee 06/24/2019  . Pes anserine bursitis 04/29/2019  . Bucket handle tear of lateral meniscus of left knee 12/12/2018  . Ganglion cyst 12/12/2018  . Pain in left knee 11/15/2018  . Fibroids 03/18/2018  . S/P parathyroidectomy ([place]) 02/12/2018  . Primary hyperparathyroidism ([place]) 01/08/2018  . Hyperparathyroidism , secondary, non-renal ([place]) 01/08/2018  . Fibroid uterus 12/06/2017  . Hypercalcemia 12/06/2017  . Fibroid 10/15/2017  . Abnormal uterine bleeding (AUB) 08/20/2017  . Anemia 08/20/2017    Past Surgical History:  Procedure Laterality Date  . CESAREAN SECTION    . IR ANGIOGRAM PELVIS SELECTIVE OR SUPRASELECTIVE  03/18/2018  . IR ANGIOGRAM PELVIS SELECTIVE OR SUPRASELECTIVE  03/18/2018  . IR ANGIOGRAM SELECTIVE EACH ADDITIONAL VESSEL  03/18/2018  . IR ANGIOGRAM SELECTIVE EACH ADDITIONAL VESSEL  03/18/2018  . IR EMBO TUMOR ORGAN ISCHEMIA INFARCT INC GUIDE ROADMAPPING  03/18/2018  . IR RADIOLOGIST EVAL & MGMT  10/17/2017  . IR RADIOLOGIST EVAL & MGMT  12/20/2017  . IR RADIOLOGIST EVAL & MGMT  04/17/2018  . IR US GUIDE VASC  ACCESS RIGHT  03/18/2018  . KNEE ARTHROSCOPY WITH LATERAL MENISECTOMY Left 12/24/2018   Procedure: LEFT KNEE ARTHROSCOPY WITH LATERAL MENISECTOM, debriment of GANGLION CYST;  Surgeon: [name], MD;  Location: [facility];  Service: Orthopedics;  Laterality: Left;  . PARATHYROIDECTOMY N/A 02/12/2018   Procedure: PARATHYROIDECTOMY;  Surgeon: Fredirick Maudlin, MD;  Location: [facility] ORS;  Service: General;  Laterality: N/A;  . WISDOM TOOTH EXTRACTION     age 51     OB History    Gravida  2   Para  1   Term  1   Preterm  0   AB  1   Living  1     SAB  1   IAB  0   Ectopic  0   Multiple  0   Live Births  1           Family History  Problem Relation Age of Onset  . Diabetes Sister   . Cancer Maternal Grandmother        ovarian  . Hypercalcemia Mother     Social History   Tobacco Use  . Smoking status: Current Some Day Smoker    Packs/day: 0.25    Years: 14.00    Pack years: 3.50    Types: Cigarettes  . Smokeless tobacco: Never Used  Vaping Use  . Vaping Use: Never used  Substance Use Topics  .  Alcohol use: Yes    Comment: occassionally on weekends  . Drug use: Yes    Types: Marijuana    Home Medications Prior to Admission medications   Medication Sig Start Date End Date Taking? Authorizing Provider  hydrOXYzine (ATARAX/VISTARIL) 50 MG tablet Take 1-2 tablets (50-100 mg total) by mouth every 8 (eight) hours as needed. 09/17/19   Nicolette Bang, DO  predniSONE (DELTASONE) 20 MG tablet Take 2 tablets (40 mg total) by mouth daily with breakfast. For the next four days 01/09/20   Carmin Muskrat, MD    Allergies    Pork-derived products  Review of Systems   Review of Systems  Musculoskeletal:       R toe injury  Skin: Negative for wound.  Neurological: Negative for weakness and numbness.    Physical Exam Updated Vital Signs BP 113/70 (BP Location: Left Arm)   Pulse 81   Temp 99 F (37.2 C) (Oral)   Resp 16   Ht 5\' 3"  (1.6 m)    Wt 64 kg   LMP 01/09/2020   SpO2 100%   BMI 24.98 kg/m   Physical Exam Vitals and nursing note reviewed.  Constitutional:      General: She is not in acute distress.    Appearance: She is well-developed and well-nourished. She is not diaphoretic.  HENT:     Head: Normocephalic and atraumatic.  Eyes:     General: No scleral icterus.    Conjunctiva/sclera: Conjunctivae normal.  Cardiovascular:     Rate and Rhythm: Normal rate and regular rhythm.     Heart sounds: Normal heart sounds. No murmur heard. No friction rub. No gallop.   Pulmonary:     Effort: Pulmonary effort is normal. No respiratory distress.     Breath sounds: Normal breath sounds.  Abdominal:     General: Bowel sounds are normal. There is no distension.     Palpations: Abdomen is soft. There is no mass.     Tenderness: There is no abdominal tenderness. There is no guarding.  Musculoskeletal:     Cervical back: Normal range of motion.     Comments: Right toe with some bruising.  No injury to the nail, no pain to palpation of the metatarsal or over the head of the proximal fifth metatarsal.  Full range of motion of the ankle, normal DP and PT pulses and sensation.  Skin:    General: Skin is warm and dry.  Neurological:     Mental Status: She is alert and oriented to person, place, and time.  Psychiatric:        Behavior: Behavior normal.     ED Results / Procedures / Treatments   Labs (all labs ordered are listed, but only abnormal results are displayed) Labs Reviewed - No data to display  EKG None  Radiology DG Foot Complete Right  Result Date: 02/07/2020 CLINICAL DATA:  Injured fifth toe 2 days ago. Persistent pain and swelling. EXAM: RIGHT FOOT COMPLETE - 3+ VIEW COMPARISON:  None. FINDINGS: The joint spaces are maintained. No acute fracture is identified. However, difficult to assess the fused middle and distal phalanges of the fifth toe on the lateral film. A dedicated lateral x-ray of that toe may be  helpful if there is strong clinical concern for fracture. IMPRESSION: 1. No acute bony findings. 2. Limited evaluation of the fifth toe on the lateral film as above. Electronically Signed   By: Marijo Sanes M.D.   On: 02/07/2020 12:09  Procedures Procedures (including critical care time)  Medications Ordered in ED Medications - No data to display  ED Course  I have reviewed the triage vital signs and the nursing notes.  Pertinent labs & imaging results that were available during my care of the patient were reviewed by me and considered in my medical decision making (see chart for details).    MDM Rules/Calculators/A&P                          Left toe patient here with injury.  I ordered and reviewed images.  She appears to have a fused fifth digit.  No obvious evidence of fracture.  I do not think we need to progress with dedicated lateral films given the fact that treatment for contusion or fracture will be the same.  She is unable to take NSAIDs and will take Tylenol.  Discussed return precautions and Final Clinical Impression(s) / ED Diagnoses Final diagnoses:  Contusion of fifth toe    Rx / DC Orders ED Discharge Orders    None       Margarita Mail, PA-C 02/07/20 1712    Daleen Bo, MD 02/07/20 2034

## 2020-02-07 NOTE — ED Triage Notes (Signed)
Stumped rt 5th toe Thursday, swollen and painful. Can not apply pressure to toe.

## 2020-03-18 ENCOUNTER — Emergency Department (HOSPITAL_COMMUNITY): Payer: Self-pay

## 2020-03-18 ENCOUNTER — Encounter (HOSPITAL_COMMUNITY): Payer: Self-pay

## 2020-03-18 ENCOUNTER — Emergency Department (HOSPITAL_COMMUNITY)
Admission: EM | Admit: 2020-03-18 | Discharge: 2020-03-18 | Disposition: A | Discharge status: Home / Self Care | Payer: Self-pay | Attending: Emergency Medicine | Admitting: Emergency Medicine

## 2020-03-18 ENCOUNTER — Other Ambulatory Visit: Payer: Self-pay

## 2020-03-18 DIAGNOSIS — F17210 Nicotine dependence, cigarettes, uncomplicated: Secondary | ICD-10-CM | POA: Insufficient documentation

## 2020-03-18 DIAGNOSIS — K219 Gastro-esophageal reflux disease without esophagitis: Secondary | ICD-10-CM | POA: Insufficient documentation

## 2020-03-18 DIAGNOSIS — K5900 Constipation, unspecified: Secondary | ICD-10-CM | POA: Insufficient documentation

## 2020-03-18 DIAGNOSIS — R1030 Lower abdominal pain, unspecified: Secondary | ICD-10-CM

## 2020-03-18 DIAGNOSIS — K648 Other hemorrhoids: Secondary | ICD-10-CM | POA: Insufficient documentation

## 2020-03-18 LAB — COMPREHENSIVE METABOLIC PANEL
ALT: 16 U/L (ref 0–44)
AST: 17 U/L (ref 15–41)
Albumin: 4 g/dL (ref 3.5–5.0)
Alkaline Phosphatase: 66 U/L (ref 38–126)
Anion gap: 10 (ref 5–15)
BUN: 11 mg/dL (ref 6–20)
CO2: 22 mmol/L (ref 22–32)
Calcium: 9.5 mg/dL (ref 8.9–10.3)
Chloride: 105 mmol/L (ref 98–111)
Creatinine, Ser: 0.71 mg/dL (ref 0.44–1.00)
GFR, Estimated: >60 mL/min (ref >60)
Glucose, Bld: 92 mg/dL (ref 70–99)
Potassium: 4.1 mmol/L (ref 3.5–5.1)
Sodium: 137 mmol/L (ref 135–145)
Total Bilirubin: 0.9 mg/dL (ref 0.3–1.2)
Total Protein: 6.5 g/dL (ref 6.5–8.1)

## 2020-03-18 LAB — CBC
HCT: 35.1 % — ABNORMAL LOW (ref 36.0–46.0)
Hemoglobin: 10.9 g/dL — ABNORMAL LOW (ref 12.0–15.0)
MCH: 23.6 pg — ABNORMAL LOW (ref 26.0–34.0)
MCHC: 31.1 g/dL (ref 30.0–36.0)
MCV: 76 fL — ABNORMAL LOW (ref 80.0–100.0)
Platelets: 327 10*3/uL (ref 150–400)
RBC: 4.62 MIL/uL (ref 3.87–5.11)
RDW: 16.2 % — ABNORMAL HIGH (ref 11.5–15.5)
WBC: 10.7 10*3/uL — ABNORMAL HIGH (ref 4.0–10.5)
nRBC: 0 % (ref 0.0–0.2)

## 2020-03-18 LAB — LIPASE, BLOOD: Lipase: 23 U/L (ref 11–51)

## 2020-03-18 LAB — I-STAT BETA HCG BLOOD, ED (MC, WL, AP ONLY): I-stat hCG, quantitative: <5 m[IU]/mL (ref <5)

## 2020-03-18 MED ORDER — ONDANSETRON HCL 4 MG/2ML IJ SOLN
4.0000 mg | Freq: Once | INTRAMUSCULAR | Status: AC
  Administered 2020-03-18: 4 mg via INTRAVENOUS
  Filled 2020-03-18: qty 2

## 2020-03-18 MED ORDER — KETOROLAC TROMETHAMINE 15 MG/ML IJ SOLN
15.0000 mg | Freq: Once | INTRAMUSCULAR | Status: AC
  Administered 2020-03-18: 15 mg via INTRAVENOUS
  Filled 2020-03-18: qty 1

## 2020-03-18 MED ORDER — DOCUSATE SODIUM 100 MG PO CAPS: 100.0000 mg | ORAL_CAPSULE | Freq: Every day | ORAL | 0 refills | Status: DC | PRN

## 2020-03-18 MED ORDER — DICYCLOMINE HCL 10 MG PO CAPS
10.0000 mg | ORAL_CAPSULE | Freq: Once | ORAL | Status: AC
  Administered 2020-03-18: 10 mg via ORAL
  Filled 2020-03-18: qty 1

## 2020-03-18 MED ORDER — DICYCLOMINE HCL 20 MG PO TABS: 20.0000 mg | ORAL_TABLET | Freq: Two times a day (BID) | ORAL | 0 refills | Status: DC | PRN

## 2020-03-18 MED ORDER — IOHEXOL 300 MG/ML  SOLN
100.0000 mL | Freq: Once | INTRAMUSCULAR | Status: AC | PRN
  Administered 2020-03-18: 100 mL via INTRAVENOUS

## 2020-03-18 MED ORDER — HYDROCORTISONE (PERIANAL) 2.5 % EX CREA: 1.0000 "application " | TOPICAL_CREAM | Freq: Two times a day (BID) | CUTANEOUS | 0 refills | Status: DC | PRN

## 2020-03-18 MED ORDER — FENTANYL CITRATE (PF) 100 MCG/2ML IJ SOLN
50.0000 ug | Freq: Once | INTRAMUSCULAR | Status: AC
  Administered 2020-03-18: 50 ug via INTRAVENOUS
  Filled 2020-03-18: qty 2

## 2020-03-18 NOTE — Discharge Instructions (Addendum)
Use MiraLAX over-the-counter.  Use 1-2 capfuls in 8 ounces of liquid up to 3 times a day as needed for constipation. Use Anusol cream as needed for hemorrhoid symptoms, including pain, bleeding, or itching. Continue taking Tylenol or ibuprofen as needed for pain. You may supplement with Bentyl as needed for abdominal pain or cramping. Follow-up with the GI doctor listed below for further evaluation of your symptoms. Return to the emergency room if you develop fevers, persistent vomiting, severe worsening pain, or any new, worsening, or concerning symptoms.

## 2020-03-18 NOTE — ED Triage Notes (Signed)
Pt reports she is here today due to lower abd pain & rectal bleeding. Pt reports x2 weeks she started having abd pain and states she has been struggling to have a bowel movement and reports when she wipes she has blood on the tissue.

## 2020-03-18 NOTE — ED Provider Notes (Signed)
[facility] EMERGENCY DEPARTMENT Provider Note   CSN: [id number] Arrival date & time: 03/18/20  1052     History Chief Complaint  Patient presents with  . Abdominal Pain    Katie Benson is a 40 y.o. female presenting for evaluation of abd pain, rectal bleeding.   Pt states she has been having intermittent abd sxs for ~ 1 year, becoming more constant over the past few days. She is taking aleve without improvement. She has intermittent nausea, decreased PO intake. She reports unintentional weight loss. No fevers or chills, vaginal bleeding. She reports associated straining and constipation. Feels she has a hemorrhoid. She has never seen GI or had a colonoscopy. She used to be on medication for fibroids, stopped about 1 yr ago. She has been taking medication for constipation without improvement. She currently takes no medications daily.   HPI     Past Medical History:  Diagnosis Date  . Anemia   . Fibroids   . GERD (gastroesophageal reflux disease)   . History of kidney stones   . Neck injury     Patient Active Problem List   Diagnosis Date Noted  . Effusion, left knee 06/24/2019  . Pes anserine bursitis 04/29/2019  . Bucket handle tear of lateral meniscus of left knee 12/12/2018  . Ganglion cyst 12/12/2018  . Pain in left knee 11/15/2018  . Fibroids 03/18/2018  . S/P parathyroidectomy ([place]) 02/12/2018  . Primary hyperparathyroidism ([place]) 01/08/2018  . Hyperparathyroidism , secondary, non-renal ([place]) 01/08/2018  . Fibroid uterus 12/06/2017  . Hypercalcemia 12/06/2017  . Fibroid 10/15/2017  . Abnormal uterine bleeding (AUB) 08/20/2017  . Anemia 08/20/2017    Past Surgical History:  Procedure Laterality Date  . CESAREAN SECTION    . IR ANGIOGRAM PELVIS SELECTIVE OR SUPRASELECTIVE  03/18/2018  . IR ANGIOGRAM PELVIS SELECTIVE OR SUPRASELECTIVE  03/18/2018  . IR ANGIOGRAM SELECTIVE EACH ADDITIONAL VESSEL  03/18/2018  . IR ANGIOGRAM SELECTIVE EACH  ADDITIONAL VESSEL  03/18/2018  . IR EMBO TUMOR ORGAN ISCHEMIA INFARCT INC GUIDE ROADMAPPING  03/18/2018  . IR RADIOLOGIST EVAL & MGMT  10/17/2017  . IR RADIOLOGIST EVAL & MGMT  12/20/2017  . IR RADIOLOGIST EVAL & MGMT  04/17/2018  . IR US GUIDE VASC ACCESS RIGHT  03/18/2018  . KNEE ARTHROSCOPY WITH LATERAL MENISECTOMY Left 12/24/2018   Procedure: LEFT KNEE ARTHROSCOPY WITH LATERAL MENISECTOM, debriment of GANGLION CYST;  Surgeon: [name], MD;  Location: [facility];  Service: Orthopedics;  Laterality: Left;  . PARATHYROIDECTOMY N/A 02/12/2018   Procedure: PARATHYROIDECTOMY;  Surgeon: Fredirick Maudlin, MD;  Location: [facility] ORS;  Service: General;  Laterality: N/A;  . WISDOM TOOTH EXTRACTION     age 26     OB History    Gravida  2   Para  1   Term  1   Preterm  0   AB  1   Living  1     SAB  1   IAB  0   Ectopic  0   Multiple  0   Live Births  1           Family History  Problem Relation Age of Onset  . Diabetes Sister   . Cancer Maternal Grandmother        ovarian  . Hypercalcemia Mother     Social History   Tobacco Use  . Smoking status: Current Some Day Smoker    Packs/day: 0.25    Years: 14.00  Pack years: 3.50    Types: Cigarettes  . Smokeless tobacco: Never Used  Vaping Use  . Vaping Use: Never used  Substance Use Topics  . Alcohol use: Yes    Comment: occassionally on weekends  . Drug use: Yes    Types: Marijuana    Home Medications Prior to Admission medications   Medication Sig Start Date End Date Taking? Authorizing Provider  dicyclomine (BENTYL) 20 MG tablet Take 1 tablet (20 mg total) by mouth 2 (two) times daily as needed for spasms. 03/18/20  Yes Caccavale, Sophia, PA-C  hydrocortisone (ANUSOL-HC) 2.5 % rectal cream Place 1 application rectally 2 (two) times daily as needed for hemorrhoids or anal itching. 03/18/20  Yes Caccavale, Sophia, PA-C  hydrOXYzine (ATARAX/VISTARIL) 50 MG tablet Take 1-2 tablets (50-100 mg total) by mouth  every 8 (eight) hours as needed. 09/17/19   Nicolette Bang, DO  predniSONE (DELTASONE) 20 MG tablet Take 2 tablets (40 mg total) by mouth daily with breakfast. For the next four days 01/09/20   Carmin Muskrat, MD    Allergies    Pork-derived products  Review of Systems   Review of Systems  Constitutional: Positive for appetite change and unexpected weight change.  Gastrointestinal: Positive for abdominal pain and blood in stool.  All other systems reviewed and are negative.   Physical Exam Updated Vital Signs BP 114/75   Pulse 63   Temp 98.2 F (36.8 C) (Oral)   Resp 20   Ht 5\' 3"  (1.6 m)   LMP 03/10/2020   SpO2 100%   BMI 24.98 kg/m   Physical Exam Vitals and nursing note reviewed. Exam conducted with a chaperone present.  Constitutional:      General: She is not in acute distress.    Appearance: She is well-developed and well-nourished.     Comments: Appears nontoxic  HENT:     Head: Normocephalic and atraumatic.  Eyes:     Extraocular Movements: Extraocular movements intact and EOM normal.     Conjunctiva/sclera: Conjunctivae normal.     Pupils: Pupils are equal, round, and reactive to light.  Cardiovascular:     Rate and Rhythm: Normal rate and regular rhythm.     Pulses: Normal pulses and intact distal pulses.  Pulmonary:     Effort: Pulmonary effort is normal. No respiratory distress.     Breath sounds: Normal breath sounds. No wheezing.  Abdominal:     General: There is no distension.     Palpations: Abdomen is soft. There is no mass.     Tenderness: There is abdominal tenderness. There is no guarding or rebound.     Comments: Diffuse ttp, worse in the lower abd. No rigidity, guarding, distention. Negative rebound  Genitourinary:    Rectum: Internal hemorrhoid present.     Comments: Internal hemorrhoid noted.  No bleeding external hemorrhoids.  Very slight streaking of bright red noted on the glove, no active bleeding. Musculoskeletal:         General: Normal range of motion.     Cervical back: Normal range of motion and neck supple.  Skin:    General: Skin is warm and dry.  Neurological:     Mental Status: She is alert and oriented to person, place, and time.  Psychiatric:        Mood and Affect: Mood and affect normal.     ED Results / Procedures / Treatments   Labs (all labs ordered are listed, but only abnormal results are displayed) Labs  Reviewed  CBC - Abnormal; Notable for the following components:      Result Value   WBC 10.7 (*)    Hemoglobin 10.9 (*)    HCT 35.1 (*)    MCV 76.0 (*)    MCH 23.6 (*)    RDW 16.2 (*)    All other components within normal limits  LIPASE, BLOOD  COMPREHENSIVE METABOLIC PANEL  URINALYSIS, ROUTINE W REFLEX MICROSCOPIC  I-STAT BETA HCG BLOOD, ED ([facility], [facility], [facility] ONLY)    EKG None  Radiology CT ABDOMEN PELVIS W CONTRAST  Result Date: 03/18/2020 CLINICAL DATA:  Abdominal pain question of diverticulitis EXAM: CT ABDOMEN AND PELVIS WITH CONTRAST TECHNIQUE: Multidetector CT imaging of the abdomen and pelvis was performed using the standard protocol following bolus administration of intravenous contrast. CONTRAST:  125mL OMNIPAQUE IOHEXOL 300 MG/ML  SOLN COMPARISON:  January 27, 2010 FINDINGS: Lower chest: The visualized heart size within normal limits. No pericardial fluid/thickening. No hiatal hernia. The visualized portions of the lungs are clear. Hepatobiliary: The liver is normal in density without focal abnormality.The main portal vein is patent. No evidence of calcified gallstones, gallbladder wall thickening or biliary dilatation. Pancreas: Unremarkable. No pancreatic ductal dilatation or surrounding inflammatory changes. Spleen: Normal in size without focal abnormality. Adrenals/Urinary Tract: Both adrenal glands appear normal. Left-sided renal calculi are noted. The largest measuring 4 mm within the midpole. No renal calculi are seen within the right kidney. No hydronephrosis. Bladder  is unremarkable. Stomach/Bowel: The stomach, small bowel, and colon are normal in appearance. No inflammatory changes, wall thickening, or obstructive findings. A moderate amount of colonic stool is present.The appendix is normal. Vascular/Lymphatic: There are no enlarged mesenteric, retroperitoneal, or pelvic lymph nodes. No significant vascular findings are present. Reproductive: Again noted is a retroverted uterus. A probable small partially calcified uterine fibroid is seen posteriorly. Other: No evidence of abdominal wall mass or hernia. Musculoskeletal: No acute or significant osseous findings. IMPRESSION: Nonobstructing left renal calculi Moderate amount of colonic stool without evidence of obstruction. Electronically Signed   By: Prudencio Pair M.D.   On: 03/18/2020 21:31    Procedures Procedures (including critical care time)  Medications Ordered in ED Medications  ondansetron (ZOFRAN) injection 4 mg (4 mg Intravenous Given 03/18/20 2007)  fentaNYL (SUBLIMAZE) injection 50 mcg (50 mcg Intravenous Given 03/18/20 2007)  dicyclomine (BENTYL) capsule 10 mg (10 mg Oral Given 03/18/20 2157)  ketorolac (TORADOL) 15 MG/ML injection 15 mg (15 mg Intravenous Given 03/18/20 2158)  iohexol (OMNIPAQUE) 300 MG/ML solution 100 mL (100 mLs Intravenous Contrast Given 03/18/20 2127)    ED Course  I have reviewed the triage vital signs and the nursing notes.  Pertinent labs & imaging results that were available during my care of the patient were reviewed by me and considered in my medical decision making (see chart for details).    MDM Rules/Calculators/A&P                          Patient presenting for evaluation of intermittent abdominal symptoms over a year, worsening in the past few days.  On exam, patient appears nontoxic.  She does have tenderness palpation, worse in the lower abdomen.  She reports associated constipation, straining, hemorrhoid bleeding.  On exam, she does have internal hemorrhoid. One  mild light streak of blood.  No concerning bleeding.  Hemoglobin is slightly low, but stable. consider diverticulitis, will order CT. Consider fibroid pain due to pt's history. consider ibd/ibs.  CT shows no acute findings, moderate stool burden.  Discussed findings with patient.  Discussed treatment for constipation and hemorrhoids.  Discussed follow-up with GI, resources given.  At this time, patient received a discharge.  Return precautions given.  Patient states she understands and agrees to plan.   Final Clinical Impression(s) / ED Diagnoses Final diagnoses:  Lower abdominal pain  Constipation, unspecified constipation type  Internal hemorrhoid    Rx / DC Orders ED Discharge Orders         Ordered    hydrocortisone (ANUSOL-HC) 2.5 % rectal cream  2 times daily PRN        03/18/20 2141    dicyclomine (BENTYL) 20 MG tablet  2 times daily PRN        03/18/20 2141    docusate sodium (COLACE) 100 MG capsule  Daily PRN,   Status:  Discontinued        03/18/20 2143           [place], Sophia, PA-C 03/18/20 [place], [place], DO 03/18/20 2314

## 2020-03-18 NOTE — ED Notes (Signed)
Pt ambulated well outside to smoke a cigarette

## 2020-03-19 ENCOUNTER — Encounter: Payer: Self-pay | Admitting: Gastroenterology

## 2020-04-23 ENCOUNTER — Encounter: Payer: Self-pay | Admitting: Gastroenterology

## 2020-04-23 ENCOUNTER — Ambulatory Visit (INDEPENDENT_AMBULATORY_CARE_PROVIDER_SITE_OTHER): Payer: Self-pay | Admitting: Gastroenterology

## 2020-04-23 VITALS — BP 110/70 | HR 77 | Ht 62.0 in | Wt 133.0 lb

## 2020-04-23 DIAGNOSIS — R1084 Generalized abdominal pain: Secondary | ICD-10-CM

## 2020-04-23 DIAGNOSIS — R634 Abnormal weight loss: Secondary | ICD-10-CM

## 2020-04-23 DIAGNOSIS — K625 Hemorrhage of anus and rectum: Secondary | ICD-10-CM

## 2020-04-23 MED ORDER — DICYCLOMINE HCL 20 MG PO TABS: 20.0000 mg | ORAL_TABLET | Freq: Four times a day (QID) | ORAL | 3 refills | Status: DC | PRN

## 2020-04-23 NOTE — Patient Instructions (Addendum)
You have been scheduled for an endoscopy and colonoscopy. Please follow the written instructions given to you at your visit today. Please pick up your prep supplies at the pharmacy within the next 1-3 days. If you use inhalers (even only as needed), please bring them with you on the day of your procedure.  We have sent Dicyclomine to your pharmacy  Eat small frequent meals  Take Miralax 1 capful twice daily  Due to recent changes in healthcare laws, you may see the results of your imaging and laboratory studies on MyChart before your provider has had a chance to review them.  We understand that in some cases there may be results that are confusing or concerning to you. Not all laboratory results come back in the same time frame and the provider may be waiting for multiple results in order to interpret others.  Please give [place] 48 hours in order for your provider to thoroughly review all the results before contacting the office for clarification of your results.   I appreciate the  opportunity to care for you  Thank You   Harl Bowie , MD

## 2020-04-23 NOTE — Progress Notes (Signed)
Katie Benson    [id number]    1981/02/24  Primary Care Physician:Patient, No Pcp Per  Referring Physician: No referring provider defined for this encounter.   Chief complaint:  Rectal bleeding  HPI:  40 year old very pleasant female here for new patient evaluation for rectal bleeding.  She has lost 50 pounds in the past year unintentionally.  She has abdominal bloating and pain, worse sometimes after a meal.  She had multiple ER visits, most recent in January for lower abdominal pain and rectal bleeding  No dysphagia, odynophagia vomiting or melena.  Denies NSAIDs or recreational drugs   Family history negative for GI malignancy or IBD.  CT abdomen pelvis with contrast March 18, 2020 showed nonobstructive left renal calculi, moderate amount of colonic stool with no evidence of obstruction or acute pathology  Outpatient Encounter Medications as of 04/23/2020  Medication Sig  . dicyclomine (BENTYL) 20 MG tablet Take 1 tablet (20 mg total) by mouth 2 (two) times daily as needed for spasms.  . hydrocortisone (ANUSOL-HC) 2.5 % rectal cream Place 1 application rectally 2 (two) times daily as needed for hemorrhoids or anal itching.  . polyethylene glycol (MIRALAX / GLYCOLAX) 17 g packet Take 17 g by mouth daily.  . [DISCONTINUED] hydrOXYzine (ATARAX/VISTARIL) 50 MG tablet Take 1-2 tablets (50-100 mg total) by mouth every 8 (eight) hours as needed.  . [DISCONTINUED] predniSONE (DELTASONE) 20 MG tablet Take 2 tablets (40 mg total) by mouth daily with breakfast. For the next four days   No facility-administered encounter medications on file as of 04/23/2020.    Allergies as of 04/23/2020 - Review Complete 04/23/2020  Allergen Reaction Noted  . Pork-derived products Hives and Swelling 03/08/2012    Past Medical History:  Diagnosis Date  . Anemia   . Arthritis   . Fibroids   . GERD (gastroesophageal reflux disease)   . History of kidney stones   . Neck  injury     Past Surgical History:  Procedure Laterality Date  . CESAREAN SECTION    . IR ANGIOGRAM PELVIS SELECTIVE OR SUPRASELECTIVE  03/18/2018  . IR ANGIOGRAM PELVIS SELECTIVE OR SUPRASELECTIVE  03/18/2018  . IR ANGIOGRAM SELECTIVE EACH ADDITIONAL VESSEL  03/18/2018  . IR ANGIOGRAM SELECTIVE EACH ADDITIONAL VESSEL  03/18/2018  . IR EMBO TUMOR ORGAN ISCHEMIA INFARCT INC GUIDE ROADMAPPING  03/18/2018  . IR RADIOLOGIST EVAL & MGMT  10/17/2017  . IR RADIOLOGIST EVAL & MGMT  12/20/2017  . IR RADIOLOGIST EVAL & MGMT  04/17/2018  . IR US GUIDE VASC ACCESS RIGHT  03/18/2018  . KNEE ARTHROSCOPY WITH LATERAL MENISECTOMY Left 12/24/2018   Procedure: LEFT KNEE ARTHROSCOPY WITH LATERAL MENISECTOM, debriment of GANGLION CYST;  Surgeon: [name], MD;  Location: [facility];  Service: Orthopedics;  Laterality: Left;  . PARATHYROIDECTOMY N/A 02/12/2018   Procedure: PARATHYROIDECTOMY;  Surgeon: Fredirick Maudlin, MD;  Location: [facility] OR[facility];  Service: General;  Laterality: N/A;  . WISDOM TOOTH EXTRACTION     age 3    Family History  Problem Relation Age of Onset  . Diabetes Sister   . Cancer Maternal Grandmother        ovarian  . Hypercalcemia Mother   . Other Father        HX Unknown    Social History   Socioeconomic History  . Marital status: Single    Spouse name: Not on file  . Number of children: 1  . Years of  education: Not on file  . Highest education level: Not on file  Occupational History  . Occupation: unemployed  Tobacco Use  . Smoking status: Current Some Day Smoker    Packs/day: 0.25    Years: 14.00    Pack years: 3.50    Types: Cigarettes  . Smokeless tobacco: Never Used  Vaping Use  . Vaping Use: Never used  Substance and Sexual Activity  . Alcohol use: Yes    Comment: occassionally on weekends  . Drug use: Yes    Types: Marijuana    Comment: multiple times a day  . Sexual activity: Yes    Partners: Male    Birth control/protection: Condom  Other Topics Concern   . Not on file  Social History Narrative  . Not on file   Social Determinants of Health   Financial Resource Strain: Not on file  Food Insecurity: Not on file  Transportation Needs: Not on file  Physical Activity: Not on file  Stress: Not on file  Social Connections: Not on file  Intimate Partner Violence: Not on file      Review of systems: All other review of systems negative except as mentioned in the HPI.   Physical Exam: Vitals:   04/23/20 1019  BP: 110/70  Pulse: 77   Body mass index is 24.33 kg/m. Gen:      No acute distress HEENT:  sclera anicteric Abd:      soft, non-tender; no palpable masses, no distension Ext:    No edema Neuro: alert and oriented x 3 Psych: normal mood and affect  Data Reviewed:  Reviewed labs, radiology imaging, old records and pertinent past GI work up   Assessment and Plan/Recommendations:  40 year old very pleasant female with complaints of rectal bleeding, postprandial abdominal pain and unintentional significant weight loss Schedule for EGD and colonoscopy for further evaluation, exclude neoplastic lesion or IBD The risks and benefits as well as alternatives of endoscopic procedure(s) have been discussed and reviewed. All questions answered. The patient agrees to proceed.  Advised patient to eat small frequent meals with high protein and high-calorie diet  Use dicyclomine 20 mg every 6-8 hours as needed for abdominal cramping and pain  MiraLAX 1 capful daily as needed for constipation Use Anusol cream per rectum twice daily as needed for hemorrhoids  Further recommendation based on findings on EGD and colonoscopy    The patient was provided an opportunity to ask questions and all were answered. The patient agreed with the plan and demonstrated an understanding of the instructions.  Damaris Hippo , MD    CC: No ref. provider found

## 2020-05-20 ENCOUNTER — Encounter: Payer: Self-pay | Admitting: Gastroenterology

## 2020-05-21 ENCOUNTER — Other Ambulatory Visit: Payer: Self-pay

## 2020-05-21 ENCOUNTER — Ambulatory Visit
Admission: EM | Admit: 2020-05-21 | Discharge: 2020-05-21 | Disposition: A | Discharge status: Home / Self Care | Payer: Self-pay | Attending: Emergency Medicine | Admitting: Emergency Medicine

## 2020-05-21 DIAGNOSIS — M25511 Pain in right shoulder: Secondary | ICD-10-CM

## 2020-05-21 DIAGNOSIS — M79601 Pain in right arm: Secondary | ICD-10-CM

## 2020-05-21 MED ORDER — METHOCARBAMOL 500 MG PO TABS: 500.0000 mg | ORAL_TABLET | Freq: Two times a day (BID) | ORAL | 0 refills | Status: DC | PRN

## 2020-05-21 MED ORDER — IBUPROFEN 600 MG PO TABS: 600.0000 mg | ORAL_TABLET | Freq: Four times a day (QID) | ORAL | 0 refills | Status: DC | PRN

## 2020-05-21 NOTE — ED Triage Notes (Signed)
Pt present right arm pain, symptoms started a month ago but severely gotten worst this week. Pt states the pain starts at the top of her back and moves toward her right arm. Pt arm is swollen.

## 2020-05-21 NOTE — ED Provider Notes (Signed)
[facility]    CSN: [id number] Arrival date & time: 05/21/20  1145      History   Chief Complaint Chief Complaint  Patient presents with  . Arm Pain    HPI Katie Enterline is a 40 y.o. female.   Patient presents with right shoulder and arm pain x1 month; worse x1 week.  She denies falls or injury.  She denies numbness, weakness, paresthesias, wounds, redness, bruising, or other symptoms.  Treatment attempted at home with Aleve.  Her medical history includes GERD, abnormal uterine bleeding, fibroid, primary hyperparathyroidism, parathyroidectomy, arthritis, anemia, history of kidney stones.  The history is provided by the patient and medical records.    Past Medical History:  Diagnosis Date  . Anemia   . Arthritis   . Fibroids   . GERD (gastroesophageal reflux disease)   . History of kidney stones   . Neck injury     Patient Active Problem List   Diagnosis Date Noted  . Effusion, left knee 06/24/2019  . Pes anserine bursitis 04/29/2019  . Bucket handle tear of lateral meniscus of left knee 12/12/2018  . Ganglion cyst 12/12/2018  . Pain in left knee 11/15/2018  . Fibroids 03/18/2018  . S/P parathyroidectomy ([place]) 02/12/2018  . Primary hyperparathyroidism ([place]) 01/08/2018  . Hyperparathyroidism , secondary, non-renal ([place]) 01/08/2018  . Fibroid uterus 12/06/2017  . Hypercalcemia 12/06/2017  . Fibroid 10/15/2017  . Abnormal uterine bleeding (AUB) 08/20/2017  . Anemia 08/20/2017    Past Surgical History:  Procedure Laterality Date  . CESAREAN SECTION    . IR ANGIOGRAM PELVIS SELECTIVE OR SUPRASELECTIVE  03/18/2018  . IR ANGIOGRAM PELVIS SELECTIVE OR SUPRASELECTIVE  03/18/2018  . IR ANGIOGRAM SELECTIVE EACH ADDITIONAL VESSEL  03/18/2018  . IR ANGIOGRAM SELECTIVE EACH ADDITIONAL VESSEL  03/18/2018  . IR EMBO TUMOR ORGAN ISCHEMIA INFARCT INC GUIDE ROADMAPPING  03/18/2018  . IR RADIOLOGIST EVAL & MGMT  10/17/2017  . IR RADIOLOGIST EVAL & MGMT  12/20/2017   . IR RADIOLOGIST EVAL & MGMT  04/17/2018  . IR US GUIDE VASC ACCESS RIGHT  03/18/2018  . KNEE ARTHROSCOPY WITH LATERAL MENISECTOMY Left 12/24/2018   Procedure: LEFT KNEE ARTHROSCOPY WITH LATERAL MENISECTOM, debriment of GANGLION CYST;  Surgeon: [name], MD;  Location: [facility];  Service: Orthopedics;  Laterality: Left;  . PARATHYROIDECTOMY N/A 02/12/2018   Procedure: PARATHYROIDECTOMY;  Surgeon: Fredirick Maudlin, MD;  Location: [facility];  Service: General;  Laterality: N/A;  . WISDOM TOOTH EXTRACTION     age 22    OB History    Gravida  2   Para  1   Term  1   Preterm  0   AB  1   Living  1     SAB  1   IAB  0   Ectopic  0   Multiple  0   Live Births  1            Home Medications    Prior to Admission medications   Medication Sig Start Date End Date Taking? Authorizing Provider  ibuprofen (ADVIL) 600 MG tablet Take 1 tablet (600 mg total) by mouth every 6 (six) hours as needed. 05/21/20  Yes Sharion Balloon, NP  methocarbamol (ROBAXIN) 500 MG tablet Take 1 tablet (500 mg total) by mouth 2 (two) times daily as needed for muscle spasms. 05/21/20  Yes Sharion Balloon, NP  dicyclomine (BENTYL) 20 MG tablet Take 1 tablet (20 mg total) by mouth every  6 (six) hours as needed for spasms. 04/23/20   Mauri Pole, MD  hydrocortisone (ANUSOL-HC) 2.5 % rectal cream Place 1 application rectally 2 (two) times daily as needed for hemorrhoids or anal itching. 03/18/20   Caccavale, Sophia, PA-C  polyethylene glycol (MIRALAX / GLYCOLAX) 17 g packet Take 17 g by mouth daily.    [provider]    Family History Family History  Problem Relation Age of Onset  . Diabetes Sister   . Cancer Maternal Grandmother        ovarian  . Hypercalcemia Mother   . Other Father        HX Unknown    Social History Social History   Tobacco Use  . Smoking status: Current Some Day Smoker    Packs/day: 0.25    Years: 14.00    Pack years: 3.50    Types: Cigarettes  .  Smokeless tobacco: Never Used  Vaping Use  . Vaping Use: Never used  Substance Use Topics  . Alcohol use: Yes    Comment: occassionally on weekends  . Drug use: Yes    Types: Marijuana    Comment: multiple times a day     Allergies   Pork-derived products   Review of Systems Review of Systems  Constitutional: Negative for chills and fever.  HENT: Negative for ear pain and sore throat.   Eyes: Negative for pain and visual disturbance.  Respiratory: Negative for cough and shortness of breath.   Cardiovascular: Negative for chest pain and palpitations.  Gastrointestinal: Negative for abdominal pain and vomiting.  Genitourinary: Negative for dysuria and hematuria.  Musculoskeletal: Positive for arthralgias and myalgias. Negative for back pain, gait problem and joint swelling.  Skin: Negative for color change and rash.  Neurological: Negative for syncope, weakness and numbness.  All other systems reviewed and are negative.    Physical Exam Triage Vital Signs ED Triage Vitals  Enc Vitals Group     BP      Pulse      Resp      Temp      Temp src      SpO2      Weight      Height      Head Circumference      Peak Flow      Pain Score      Pain Loc      Pain Edu?      Excl. in [place]?    No data found.  Updated Vital Signs BP 122/62 (BP Location: Left Arm)   Pulse 76   Temp 97.9 F (36.6 C) (Oral)   Resp 16   SpO2 99%   Visual Acuity Right Eye Distance:   Left Eye Distance:   Bilateral Distance:    Right Eye Near:   Left Eye Near:    Bilateral Near:     Physical Exam Vitals and nursing note reviewed.  Constitutional:      General: She is not in acute distress.    Appearance: She is well-developed. She is not ill-appearing.  HENT:     Head: Normocephalic and atraumatic.     Mouth/Throat:     Mouth: Mucous membranes are moist.  Eyes:     Conjunctiva/sclera: Conjunctivae normal.  Cardiovascular:     Rate and Rhythm: Normal rate and regular rhythm.      Heart sounds: Normal heart sounds.  Pulmonary:     Effort: Pulmonary effort is normal. No respiratory distress.  Breath sounds: Normal breath sounds.  Abdominal:     Palpations: Abdomen is soft.     Tenderness: There is no abdominal tenderness.  Musculoskeletal:        General: Tenderness present. No swelling or deformity.     Cervical back: Neck supple.     Comments: Muscular tenderness in right arm and shoulder.  Limited shoulder and elbow ROM due to discomfort.  No erythema, ecchymosis, edema.  2+ radial pulse.  Sensation intact.  Strength 5/5.  Skin:    General: Skin is warm and dry.     Capillary Refill: Capillary refill takes less than 2 seconds.     Findings: No bruising, erythema, lesion or rash.  Neurological:     General: No focal deficit present.     Mental Status: She is alert and oriented to person, place, and time.     Sensory: No sensory deficit.     Motor: No weakness.     Gait: Gait normal.  Psychiatric:        Mood and Affect: Mood normal.        Behavior: Behavior normal.      UC Treatments / Results  Labs (all labs ordered are listed, but only abnormal results are displayed) Labs Reviewed - No data to display  EKG   Radiology No results found.  Procedures Procedures (including critical care time)  Medications Ordered in UC Medications - No data to display  Initial Impression / Assessment and Plan / UC Course  I have reviewed the triage vital signs and the nursing notes.  Pertinent labs & imaging results that were available during my care of the patient were reviewed by me and considered in my medical decision making (see chart for details).   Right shoulder and arm pain.  No indication of injury or infection.  Treating with ibuprofen and Robaxin.  Precautions for drowsiness with Robaxin discussed.  Instructed patient to follow-up with her PCP or an orthopedist if her symptoms are not improving.  She agrees to plan of care.   Final Clinical  Impressions(s) / UC Diagnoses   Final diagnoses:  Acute pain of right shoulder  Right arm pain     Discharge Instructions     Take ibuprofen as needed for discomfort.  Take the muscle relaxer as needed for muscle spasm; Do not drive, operate machinery, or drink alcohol with this medication as it can cause drowsiness.   Follow up with your primary care provider or an orthopedist if your symptoms are not improving.        ED Prescriptions    Medication Sig Dispense Auth. Provider   ibuprofen (ADVIL) 600 MG tablet Take 1 tablet (600 mg total) by mouth every 6 (six) hours as needed. 30 tablet Sharion Balloon, NP   methocarbamol (ROBAXIN) 500 MG tablet Take 1 tablet (500 mg total) by mouth 2 (two) times daily as needed for muscle spasms. 10 tablet Sharion Balloon, NP     PDMP not reviewed this encounter.   Sharion Balloon, NP 05/21/20 1240

## 2020-05-21 NOTE — Discharge Instructions (Signed)
Take ibuprofen as needed for discomfort.  Take the muscle relaxer as needed for muscle spasm; Do not drive, operate machinery, or drink alcohol with this medication as it can cause drowsiness.   Follow up with your primary care provider or an orthopedist if your symptoms are not improving.     

## 2020-05-27 ENCOUNTER — Ambulatory Visit: Payer: Self-pay | Admitting: Internal Medicine

## 2020-06-18 ENCOUNTER — Emergency Department (HOSPITAL_COMMUNITY)
Admission: EM | Admit: 2020-06-18 | Discharge: 2020-06-18 | Disposition: A | Discharge status: Home / Self Care | Payer: Self-pay | Attending: Emergency Medicine | Admitting: Emergency Medicine

## 2020-06-18 ENCOUNTER — Other Ambulatory Visit: Payer: Self-pay

## 2020-06-18 ENCOUNTER — Encounter (HOSPITAL_COMMUNITY): Payer: Self-pay

## 2020-06-18 DIAGNOSIS — M546 Pain in thoracic spine: Secondary | ICD-10-CM | POA: Insufficient documentation

## 2020-06-18 DIAGNOSIS — Z79899 Other long term (current) drug therapy: Secondary | ICD-10-CM | POA: Insufficient documentation

## 2020-06-18 DIAGNOSIS — F17210 Nicotine dependence, cigarettes, uncomplicated: Secondary | ICD-10-CM | POA: Insufficient documentation

## 2020-06-18 DIAGNOSIS — M25511 Pain in right shoulder: Secondary | ICD-10-CM | POA: Insufficient documentation

## 2020-06-18 DIAGNOSIS — M542 Cervicalgia: Secondary | ICD-10-CM | POA: Insufficient documentation

## 2020-06-18 MED ORDER — METHOCARBAMOL 500 MG PO TABS: 500.0000 mg | ORAL_TABLET | Freq: Two times a day (BID) | ORAL | 0 refills | Status: DC | PRN

## 2020-06-18 MED ORDER — IBUPROFEN 800 MG PO TABS: 800.0000 mg | ORAL_TABLET | Freq: Three times a day (TID) | ORAL | 0 refills | Status: DC | PRN

## 2020-06-18 MED ORDER — DIAZEPAM 2 MG PO TABS
2.0000 mg | ORAL_TABLET | Freq: Once | ORAL | Status: AC
  Administered 2020-06-18: 2 mg via ORAL
  Filled 2020-06-18: qty 1

## 2020-06-18 MED ORDER — METHYLPREDNISOLONE 4 MG PO TBPK: ORAL_TABLET | ORAL | 0 refills | Status: DC

## 2020-06-18 NOTE — ED Provider Notes (Signed)
[facility] Provider Note   CSN: [id number] Arrival date & time: 06/18/20  1117     History Chief Complaint  Patient presents with  . Arm Pain    Katie Benson is a 40 y.o. female.  The history is provided by the patient.  Illness Location:  UPPER RIGHT BACK/RIGHT SHOULDER/NECK Quality:  PAIN Severity:  Mild Onset quality:  Gradual Timing:  Intermittent Progression:  Waxing and waning Chronicity:  New Context:  Pain when moving her right upper arm. No trauma Relieved by:  Nothing Worsened by:  Movement Associated symptoms: no abdominal pain, no chest pain, no congestion, no cough, no diarrhea, no ear pain, no fever, no headaches, no loss of consciousness, no myalgias, no rash, no rhinorrhea, no shortness of breath and no vomiting        Past Medical History:  Diagnosis Date  . Anemia   . Arthritis   . Fibroids   . GERD (gastroesophageal reflux disease)   . History of kidney stones   . Neck injury     Patient Active Problem List   Diagnosis Date Noted  . Effusion, left knee 06/24/2019  . Pes anserine bursitis 04/29/2019  . Bucket handle tear of lateral meniscus of left knee 12/12/2018  . Ganglion cyst 12/12/2018  . Pain in left knee 11/15/2018  . Fibroids 03/18/2018  . S/P parathyroidectomy ([place]) 02/12/2018  . Primary hyperparathyroidism ([place]) 01/08/2018  . Hyperparathyroidism , secondary, non-renal ([place]) 01/08/2018  . Fibroid uterus 12/06/2017  . Hypercalcemia 12/06/2017  . Fibroid 10/15/2017  . Abnormal uterine bleeding (AUB) 08/20/2017  . Anemia 08/20/2017    Past Surgical History:  Procedure Laterality Date  . CESAREAN SECTION    . IR ANGIOGRAM PELVIS SELECTIVE OR SUPRASELECTIVE  03/18/2018  . IR ANGIOGRAM PELVIS SELECTIVE OR SUPRASELECTIVE  03/18/2018  . IR ANGIOGRAM SELECTIVE EACH ADDITIONAL VESSEL  03/18/2018  . IR ANGIOGRAM SELECTIVE EACH ADDITIONAL VESSEL  03/18/2018  . IR EMBO TUMOR ORGAN ISCHEMIA  INFARCT INC GUIDE ROADMAPPING  03/18/2018  . IR RADIOLOGIST EVAL & MGMT  10/17/2017  . IR RADIOLOGIST EVAL & MGMT  12/20/2017  . IR RADIOLOGIST EVAL & MGMT  04/17/2018  . IR US GUIDE VASC ACCESS RIGHT  03/18/2018  . KNEE ARTHROSCOPY WITH LATERAL MENISECTOMY Left 12/24/2018   Procedure: LEFT KNEE ARTHROSCOPY WITH LATERAL MENISECTOM, debriment of GANGLION CYST;  Surgeon: [name], MD;  Location: [facility];  Service: Orthopedics;  Laterality: Left;  . PARATHYROIDECTOMY N/A 02/12/2018   Procedure: PARATHYROIDECTOMY;  Surgeon: Fredirick Maudlin, MD;  Location: [facility];  Service: General;  Laterality: N/A;  . WISDOM TOOTH EXTRACTION     age 26     OB History    Gravida  2   Para  1   Term  1   Preterm  0   AB  1   Living  1     SAB  1   IAB  0   Ectopic  0   Multiple  0   Live Births  1           Family History  Problem Relation Age of Onset  . Diabetes Sister   . Cancer Maternal Grandmother        ovarian  . Hypercalcemia Mother   . Other Father        HX Unknown    Social History   Tobacco Use  . Smoking status: Current Some Day Smoker    Packs/day: 0.25  Years: 14.00    Pack years: 3.50    Types: Cigarettes  . Smokeless tobacco: Never Used  Vaping Use  . Vaping Use: Never used  Substance Use Topics  . Alcohol use: Yes    Comment: occassionally on weekends  . Drug use: Yes    Types: Marijuana    Comment: multiple times a day    Home Medications Prior to Admission medications   Medication Sig Start Date End Date Taking? Authorizing Provider  methylPREDNISolone (MEDROL DOSEPAK) 4 MG TBPK tablet Follow package insert 06/18/20  Yes Nyia Tsao, DO  dicyclomine (BENTYL) 20 MG tablet Take 1 tablet (20 mg total) by mouth every 6 (six) hours as needed for spasms. 04/23/20   Napoleon Form, MD  hydrocortisone (ANUSOL-HC) 2.5 % rectal cream Place 1 application rectally 2 (two) times daily as needed for hemorrhoids or anal itching. 03/18/20    Caccavale, Sophia, PA-C  ibuprofen (ADVIL) 800 MG tablet Take 1 tablet (800 mg total) by mouth every 8 (eight) hours as needed for up to 30 doses. 06/18/20   Nashla Althoff, DO  methocarbamol (ROBAXIN) 500 MG tablet Take 1 tablet (500 mg total) by mouth 2 (two) times daily as needed for up to 20 doses for muscle spasms. 06/18/20   Delailah Spieth, DO  polyethylene glycol (MIRALAX / GLYCOLAX) 17 g packet Take 17 g by mouth daily.    [provider]    Allergies    Pork-derived products  Review of Systems   Review of Systems  Constitutional: Negative for fever.  HENT: Negative for congestion, ear pain and rhinorrhea.   Respiratory: Negative for cough and shortness of breath.   Cardiovascular: Negative for chest pain.  Gastrointestinal: Negative for abdominal pain, diarrhea and vomiting.  Musculoskeletal: Positive for arthralgias, back pain and neck pain. Negative for myalgias and neck stiffness.  Skin: Negative for color change, pallor, rash and wound.  Neurological: Negative for loss of consciousness, weakness, numbness and headaches.    Physical Exam Updated Vital Signs BP 115/61   Pulse 86   Temp 99.2 F (37.3 C) (Oral)   Resp 18   SpO2 100%   Physical Exam Vitals and nursing note reviewed.  Constitutional:      General: She is not in acute distress.    Appearance: She is well-developed. She is not ill-appearing.  HENT:     Head: Normocephalic and atraumatic.  Eyes:     Extraocular Movements: Extraocular movements intact.     Conjunctiva/sclera: Conjunctivae normal.     Pupils: Pupils are equal, round, and reactive to light.  Cardiovascular:     Rate and Rhythm: Normal rate and regular rhythm.     Heart sounds: No murmur heard.   Pulmonary:     Effort: Pulmonary effort is normal. No respiratory distress.     Breath sounds: Normal breath sounds.  Abdominal:     Palpations: Abdomen is soft.     Tenderness: There is no abdominal tenderness.  Musculoskeletal:         General: Tenderness present. No swelling or deformity. Normal range of motion.     Cervical back: Normal range of motion and neck supple. Tenderness present.     Comments: No midline spinal tenderness, tenderness to paraspinal cervical muscles on the right including the upper thoracic spine and right trapezius area  Skin:    General: Skin is warm and dry.     Capillary Refill: Capillary refill takes less than 2 seconds.  Neurological:  General: No focal deficit present.     Mental Status: She is alert.     Sensory: No sensory deficit.     Motor: No weakness.     Comments: 5+ out of 5 strength throughout, normal sensation throughout     ED Results / Procedures / Treatments   Labs (all labs ordered are listed, but only abnormal results are displayed) Labs Reviewed - No data to display  EKG None  Radiology No results found.  Procedures Procedures   Medications Ordered in ED Medications  diazepam (VALIUM) tablet 2 mg (has no administration in time range)    ED Course  I have reviewed the triage vital signs and the nursing notes.  Pertinent labs & imaging results that were available during my care of the patient were reviewed by me and considered in my medical decision making (see chart for details).    MDM Rules/Calculators/A&P                          Katie Benson is here with right upper back pain, neck pain shoulder pain.  Overall suspect muscle spasm/possible radiculopathy.  Patient is tender in the paraspinal cervical and upper thoracic muscles including the right trapezius muscles.  Normal strength and sensation.  No concern for cord compression.  Possibly radiculopathy/bed muscle spasm.  She has been dealing with this for the last several weeks.  We will have her follow-up with sports medicine.  Will prescribe steroids, Motrin, muscle relaxant.  Discharged in good condition.  Normal pulses throughout as well.  This chart was dictated using voice  recognition software.  Despite best efforts to proofread,  errors can occur which can change the documentation meaning.   Final Clinical Impression(s) / ED Diagnoses Final diagnoses:  Acute pain of right shoulder    Rx / DC Orders ED Discharge Orders         Ordered    methocarbamol (ROBAXIN) 500 MG tablet  2 times daily PRN        06/18/20 1233    ibuprofen (ADVIL) 800 MG tablet  Every 8 hours PRN        06/18/20 1233    methylPREDNISolone (MEDROL DOSEPAK) 4 MG TBPK tablet        06/18/20 1233           Lennice Sites, DO 06/18/20 1237

## 2020-06-18 NOTE — ED Triage Notes (Signed)
Pt reports right arm pain from the shoulder down to her hand for the past 5 weeks, seen at [facility] for the same, given muscle relaxers and ibuprofen without relief. Pt states she is unable to lift her arm without pain.

## 2020-06-18 NOTE — Discharge Instructions (Signed)
Take 800 mg of ibuprofen every 8 hours for the next 5 days and then as needed, take 1000 mg of Tylenol every 6 hours for the next 5 days and then as needed.  Follow-up with sports medicine.  Take Medrol Dosepak as prescribed.  Take Robaxin as needed as well.

## 2020-06-18 NOTE — ED Notes (Signed)
Reviewed discharge instructions with patient. Follow-up care and medications reviewed. Patient  verbalized understanding. Patient A&Ox4, VSS, and ambulatory with steady gait upon discharge.  °

## 2020-06-24 ENCOUNTER — Ambulatory Visit (INDEPENDENT_AMBULATORY_CARE_PROVIDER_SITE_OTHER): Payer: Self-pay | Admitting: Family Medicine

## 2020-06-24 ENCOUNTER — Encounter: Payer: Self-pay | Admitting: Family Medicine

## 2020-06-24 ENCOUNTER — Other Ambulatory Visit: Payer: Self-pay

## 2020-06-24 VITALS — BP 122/82 | Ht 62.0 in | Wt 133.0 lb

## 2020-06-24 DIAGNOSIS — M5412 Radiculopathy, cervical region: Secondary | ICD-10-CM | POA: Insufficient documentation

## 2020-06-24 MED ORDER — GABAPENTIN 100 MG PO CAPS: 100.0000 mg | ORAL_CAPSULE | Freq: Three times a day (TID) | ORAL | 1 refills | Status: DC

## 2020-06-24 NOTE — Assessment & Plan Note (Signed)
Symptoms seem most consistent with radicular pain.  Has good range of motion of the elbow and shoulder. -Counseled on home exercise therapy and supportive care. -Gabapentin. -Could consider imaging or physical therapy.

## 2020-06-24 NOTE — Progress Notes (Signed)
Tieasha Larsen - 40 y.o. female MRN [id number]  Date of birth: [birth year]  SUBJECTIVE:  Including CC & ROS.  No chief complaint on file.   Jaeleah Smyser is a 40 y.o. female that is presenting with right arm pain.  The pain is been going for a few weeks.  She has tried prednisone which did improve her symptoms.  She has been taking ibuprofen and muscle relaxer.  No history of similar pain.  The pain extends from her neck down to her arm and hand.   Review of Systems See HPI   HISTORY: Past Medical, Surgical, Social, and Family History Reviewed & Updated per EMR.   Pertinent Historical Findings include:  Past Medical History:  Diagnosis Date  . Anemia   . Arthritis   . Fibroids   . GERD (gastroesophageal reflux disease)   . History of kidney stones   . Neck injury     Past Surgical History:  Procedure Laterality Date  . CESAREAN SECTION    . IR ANGIOGRAM PELVIS SELECTIVE OR SUPRASELECTIVE  03/18/2018  . IR ANGIOGRAM PELVIS SELECTIVE OR SUPRASELECTIVE  03/18/2018  . IR ANGIOGRAM SELECTIVE EACH ADDITIONAL VESSEL  03/18/2018  . IR ANGIOGRAM SELECTIVE EACH ADDITIONAL VESSEL  03/18/2018  . IR EMBO TUMOR ORGAN ISCHEMIA INFARCT INC GUIDE ROADMAPPING  03/18/2018  . IR RADIOLOGIST EVAL & MGMT  10/17/2017  . IR RADIOLOGIST EVAL & MGMT  12/20/2017  . IR RADIOLOGIST EVAL & MGMT  04/17/2018  . IR US GUIDE VASC ACCESS RIGHT  03/18/2018  . KNEE ARTHROSCOPY WITH LATERAL MENISECTOMY Left 12/24/2018   Procedure: LEFT KNEE ARTHROSCOPY WITH LATERAL MENISECTOM, debriment of GANGLION CYST;  Surgeon: [name], MD;  Location: [facility];  Service: Orthopedics;  Laterality: Left;  . PARATHYROIDECTOMY N/A 02/12/2018   Procedure: PARATHYROIDECTOMY;  Surgeon: Fredirick Maudlin, MD;  Location: [facility];  Service: General;  Laterality: N/A;  . WISDOM TOOTH EXTRACTION     age 2    Family History  Problem Relation Age of Onset  . Diabetes Sister   . Cancer Maternal Grandmother         ovarian  . Hypercalcemia Mother   . Other Father        HX Unknown    Social History   Socioeconomic History  . Marital status: Single    Spouse name: Not on file  . Number of children: 1  . Years of education: Not on file  . Highest education level: Not on file  Occupational History  . Occupation: unemployed  Tobacco Use  . Smoking status: Current Some Day Smoker    Packs/day: 0.25    Years: 14.00    Pack years: 3.50    Types: Cigarettes  . Smokeless tobacco: Never Used  Vaping Use  . Vaping Use: Never used  Substance and Sexual Activity  . Alcohol use: Yes    Comment: occassionally on weekends  . Drug use: Yes    Types: Marijuana    Comment: multiple times a day  . Sexual activity: Yes    Partners: Male    Birth control/protection: Condom  Other Topics Concern  . Not on file  Social History Narrative  . Not on file   Social Determinants of Health   Financial Resource Strain: Not on file  Food Insecurity: Not on file  Transportation Needs: Not on file  Physical Activity: Not on file  Stress: Not on file  Social Connections: Not on file  Intimate Partner Violence: Not  on file     PHYSICAL EXAM:  VS: BP 122/82 (BP Location: Left Arm, Patient Position: Sitting, Cuff Size: Normal)   Ht 5\' 2"  (1.575 m)   Wt 133 lb (60.3 kg)   BMI 24.33 kg/m  Physical Exam Gen: NAD, alert, cooperative with exam, well-appearing MSK:  Neck: Normal range of motion. Strength resistance. Fasciculations appreciated within the right upper extremity upon movement. Neurovascular intact     ASSESSMENT & PLAN:   Cervical radiculopathy Symptoms seem most consistent with radicular pain.  Has good range of motion of the elbow and shoulder. -Counseled on home exercise therapy and supportive care. -Gabapentin. -Could consider imaging or physical therapy.

## 2020-06-24 NOTE — Patient Instructions (Signed)
Nice to meet you Please try the gabapentin. Please start with one pill at night. Then you can increase to 2 or 3 times daily as you tolerate  Please try the heat  Please try the exercises   Please send me a message in MyChart with any questions or updates.  Please see me back in 2-3 weeks.   --Dr. Raeford Razor

## 2020-06-28 ENCOUNTER — Encounter: Payer: Self-pay | Admitting: Gastroenterology

## 2020-06-28 ENCOUNTER — Ambulatory Visit (AMBULATORY_SURGERY_CENTER): Payer: Self-pay | Admitting: Gastroenterology

## 2020-06-28 ENCOUNTER — Other Ambulatory Visit: Payer: Self-pay

## 2020-06-28 VITALS — BP 130/88 | HR 76 | Temp 97.0°F | Resp 16 | Ht 62.0 in | Wt 133.0 lb

## 2020-06-28 DIAGNOSIS — R634 Abnormal weight loss: Secondary | ICD-10-CM

## 2020-06-28 DIAGNOSIS — K2970 Gastritis, unspecified, without bleeding: Secondary | ICD-10-CM

## 2020-06-28 DIAGNOSIS — K229 Disease of esophagus, unspecified: Secondary | ICD-10-CM

## 2020-06-28 DIAGNOSIS — K621 Rectal polyp: Secondary | ICD-10-CM

## 2020-06-28 DIAGNOSIS — K648 Other hemorrhoids: Secondary | ICD-10-CM

## 2020-06-28 DIAGNOSIS — K625 Hemorrhage of anus and rectum: Secondary | ICD-10-CM

## 2020-06-28 DIAGNOSIS — R1013 Epigastric pain: Secondary | ICD-10-CM

## 2020-06-28 DIAGNOSIS — G8929 Other chronic pain: Secondary | ICD-10-CM

## 2020-06-28 DIAGNOSIS — R1084 Generalized abdominal pain: Secondary | ICD-10-CM

## 2020-06-28 DIAGNOSIS — K2100 Gastro-esophageal reflux disease with esophagitis, without bleeding: Secondary | ICD-10-CM

## 2020-06-28 DIAGNOSIS — D128 Benign neoplasm of rectum: Secondary | ICD-10-CM

## 2020-06-28 MED ORDER — PANTOPRAZOLE SODIUM 40 MG PO TBEC: 40.0000 mg | DELAYED_RELEASE_TABLET | Freq: Every day | ORAL | 3 refills | Status: DC

## 2020-06-28 MED ORDER — SODIUM CHLORIDE 0.9 % IV SOLN: 500.0000 mL | Freq: Once | INTRAVENOUS | Status: DC

## 2020-06-28 NOTE — Op Note (Signed)
[place] Patient Name: Katie Benson Procedure Date: 06/28/2020 11:13 AM MRN: [id number] Endoscopist: Mauri Pole , MD Age: 40 Referring MD:  Date of Birth: April 19, 1980 Gender: Female Account #: [id number] Procedure:                Upper GI endoscopy Indications:              Epigastric abdominal pain, Dyspepsia, Anorexia,                            Malnutrition, Weight loss Medicines:                Monitored Anesthesia Care Procedure:                Pre-Anesthesia Assessment:                           - Prior to the procedure, a History and Physical                            was performed, and patient medications and                            allergies were reviewed. The patient's tolerance of                            previous anesthesia was also reviewed. The risks                            and benefits of the procedure and the sedation                            options and risks were discussed with the patient.                            All questions were answered, and informed consent                            was obtained. Prior Anticoagulants: The patient has                            taken no previous anticoagulant or antiplatelet                            agents. ASA Grade Assessment: II - A patient with                            mild systemic disease. After reviewing the risks                            and benefits, the patient was deemed in                            satisfactory condition to undergo the procedure.  After obtaining informed consent, the endoscope was                            passed under direct vision. Throughout the                            procedure, the patient's blood pressure, pulse, and                            oxygen saturations were monitored continuously. The                            Endoscope was introduced through the mouth, and                            advanced to the second  part of duodenum. The upper                            GI endoscopy was accomplished without difficulty.                            The patient tolerated the procedure well. Scope In: Scope Out: Findings:                 LA Grade C (one or more mucosal breaks continuous                            between tops of 2 or more mucosal folds, less than                            75% circumference) esophagitis with no bleeding was                            found 34 to 36 cm from the incisors. Biopsies were                            taken with a cold forceps for histology.                           Few cratered and superficial esophageal ulcers were                            found 34 to 36 cm from the incisors. The largest                            lesion was less than one mm in largest dimension.                           The stomach was normal.                           The examined duodenum was normal. Complications:            No immediate complications. Estimated Blood  Loss:     Estimated blood loss was minimal. Impression:               - LA Grade C reflux esophagitis with no bleeding.                            Biopsied.                           - Esophageal ulcers.                           - Normal stomach.                           - Normal examined duodenum. Recommendation:           - Resume previous diet.                           - Continue present medications.                           - Await pathology results.                           - Follow an antireflux regimen.                           - Use Protonix (pantoprazole) 40 mg PO daily X 90                            tabs with 3 refills.                           - No aspirin, ibuprofen, naproxen, or other                            non-steroidal anti-inflammatory drugs. Mauri Pole, MD 06/28/2020 11:41:50 AM This report has been signed electronically.

## 2020-06-28 NOTE — Progress Notes (Signed)
Report given to PACU, vss 

## 2020-06-28 NOTE — Progress Notes (Signed)
Called to room to assist during endoscopic procedure.  Patient ID and intended procedure confirmed with present staff. Received instructions for my participation in the procedure from the performing physician.  

## 2020-06-28 NOTE — Progress Notes (Signed)
1110 Robinul 0.1 mg IV given due large amount of secretions upon assessment.  MD made aware, vss 

## 2020-06-28 NOTE — Progress Notes (Signed)
Vs by CW in adm 

## 2020-06-28 NOTE — Patient Instructions (Signed)
YOU HAD AN ENDOSCOPIC PROCEDURE TODAY AT [facility]:   Refer to the procedure report that was given to you for any specific questions about what was found during the examination.  If the procedure report does not answer your questions, please call your gastroenterologist to clarify.  If you requested that your care partner not be given the details of your procedure findings, then the procedure report has been included in a sealed envelope for you to review at your convenience later.  YOU SHOULD EXPECT: Some feelings of bloating in the abdomen. Passage of more gas than usual.  Walking can help get rid of the air that was put into your GI tract during the procedure and reduce the bloating. If you had a lower endoscopy (such as a colonoscopy or flexible sigmoidoscopy) you may notice spotting of blood in your stool or on the toilet paper. If you underwent a bowel prep for your procedure, you may not have a normal bowel movement for a few days.  Please Note:  You might notice some irritation and congestion in your nose or some drainage.  This is from the oxygen used during your procedure.  There is no need for concern and it should clear up in a day or so.  SYMPTOMS TO REPORT IMMEDIATELY:   Following lower endoscopy (colonoscopy or flexible sigmoidoscopy):  Excessive amounts of blood in the stool  Significant tenderness or worsening of abdominal pains  Swelling of the abdomen that is new, acute  Fever of 100F or higher   Following upper endoscopy (EGD)  Vomiting of blood or coffee ground material  New chest pain or pain under the shoulder blades  Painful or persistently difficult swallowing  New shortness of breath  Fever of 100F or higher  Black, tarry-looking stools  For urgent or emergent issues, a gastroenterologist can be reached at any hour by calling [phone]. Do not use MyChart messaging for urgent concerns.    DIET:  We do recommend a small meal at first, but  then you may proceed to your regular diet.  Drink plenty of fluids but you should avoid alcoholic beverages for 24 hours.  MEDICATIONS: Continue present medications. Use Protonix (pantoprazole) 40 mg by mouth daily (RX sent to your pharmacy). No Aspirin, Ibuprofen, Naproxen, or other non-steroidal anti-inflammatory drugs.  Please see handouts given to you by your recovery nurse.  Thank you for allowing [place] to provide for your healthcare needs today.  ACTIVITY:  You should plan to take it easy for the rest of today and you should NOT DRIVE or use heavy machinery until tomorrow (because of the sedation medicines used during the test).    FOLLOW UP: Our staff will call the number listed on your records 48-72 hours following your procedure to check on you and address any questions or concerns that you may have regarding the information given to you following your procedure. If we do not reach you, we will leave a message.  We will attempt to reach you two times.  During this call, we will ask if you have developed any symptoms of COVID 19. If you develop any symptoms (ie: fever, flu-like symptoms, shortness of breath, cough etc.) before then, please call [phone].  If you test positive for Covid 19 in the 2 weeks post procedure, please call and report this information to [place].    If any biopsies were taken you will be contacted by phone or by letter within the next 1-3 weeks.  Please call us at [phone] if you have not heard about the biopsies in 3 weeks.    SIGNATURES/CONFIDENTIALITY: You and/or your care partner have signed paperwork which will be entered into your electronic medical record.  These signatures attest to the fact that that the information above on your After Visit Summary has been reviewed and is understood.  Full responsibility of the confidentiality of this discharge information lies with you and/or your care-partner.

## 2020-06-28 NOTE — Op Note (Signed)
Katie Benson: Katie Benson Procedure Date: 06/28/2020 11:12 AM MRN: [id number] Endoscopist: Mauri Pole , MD Age: 40 Referring MD:  Date of Birth: 1980-03-05 Gender: Female Account #: [id number] Procedure:                Colonoscopy Indications:              Evaluation of unexplained GI bleeding presenting                            with Hematochezia, Generalized abdominal pain,                            Weight loss Medicines:                Monitored Anesthesia Care Procedure:                Pre-Anesthesia Assessment:                           - Prior to the procedure, a History and Physical                            was performed, and patient medications and                            allergies were reviewed. The patient's tolerance of                            previous anesthesia was also reviewed. The risks                            and benefits of the procedure and the sedation                            options and risks were discussed with the patient.                            All questions were answered, and informed consent                            was obtained. Prior Anticoagulants: The patient has                            taken no previous anticoagulant or antiplatelet                            agents. ASA Grade Assessment: II - A patient with                            mild systemic disease. After reviewing the risks                            and benefits, the patient was deemed in  satisfactory condition to undergo the procedure.                           After obtaining informed consent, the colonoscope                            was passed under direct vision. Throughout the                            procedure, the patient's blood pressure, pulse, and                            oxygen saturations were monitored continuously. The                            Olympus PFC-[id number] (#[id number]) Colonoscope  was                            introduced through the anus and advanced to the the                            terminal ileum, with identification of the                            appendiceal orifice and IC valve. The colonoscopy                            was performed without difficulty. The patient                            tolerated the procedure well. The quality of the                            bowel preparation was good. The terminal ileum,                            ileocecal valve, appendiceal orifice, and rectum                            were photographed. Scope In: 11:25:56 AM Scope Out: 11:37:09 AM Scope Withdrawal Time: 0 hours 9 minutes 13 seconds  Total Procedure Duration: 0 hours 11 minutes 13 seconds  Findings:                 The perianal and digital rectal examinations were                            normal.                           A few small-mouthed diverticula were found in the                            sigmoid colon.  A few scattered and segmental non-bleeding erosions                            were found in the rectum. Biopsies were taken with                            a cold forceps for histology.                           External and internal hemorrhoids were found during                            retroflexion with superficial ulcer on one of the                            hemorrhoids. The hemorrhoids were large.                           A 5 mm polyp was found in the rectum. The polyp was                            sessile. The polyp was removed with a cold snare.                            Resection and retrieval were complete. Complications:            No immediate complications. Estimated Blood Loss:     Estimated blood loss was minimal. Impression:               - Diverticulosis in the sigmoid colon.                           - A few erosions in the rectum. Biopsied.                           - External and internal  hemorrhoids.                           - One 5 mm polyp in the rectum, removed with a cold                            snare. Resected and retrieved. Recommendation:           - Patient has a contact number available for                            emergencies. The signs and symptoms of potential                            delayed complications were discussed with the                            patient. Return to normal activities tomorrow.  Written discharge instructions were provided to the                            patient.                           - Resume previous diet.                           - Continue present medications.                           - Await pathology results.                           - Repeat colonoscopy in 5-10 years for surveillance                            based on pathology results. Mauri Pole, MD 06/28/2020 11:45:44 AM This report has been signed electronically.

## 2020-06-30 ENCOUNTER — Telehealth: Payer: Self-pay | Admitting: Family Medicine

## 2020-06-30 ENCOUNTER — Telehealth: Payer: Self-pay

## 2020-06-30 ENCOUNTER — Telehealth: Payer: Self-pay | Admitting: Gastroenterology

## 2020-06-30 NOTE — Telephone Encounter (Signed)
Patient called states had a procedure 5/2 @ [facility] & was advised by that doctor not to take the :ibuprofen (ADVIL) 800 MG tablet  (which was prescribed by ED doctor)  --Patient ask if Dr.Schmitz can prescribe he a diff medication which will help w/ pain (per her the Gabapentin makes her slughish during the day & she can't take it.  Pt uses :  [id number] [address], [address]  [place], [place] [address]  Phone:  [phone] Fax:  [phone]   --glh

## 2020-06-30 NOTE — Telephone Encounter (Signed)
  Follow up Call-  Call back number 06/28/2020  Post procedure Call Back phone  # [phone]  Permission to leave phone message Yes  Some recent data might be hidden     Patient questions:  Do you have a fever, pain , or abdominal swelling? Yes.   Pain Score  3 *-- Patient states that she is having pain with bowel movements and is going to call the office to set up an appointment for hemorrhoidal banding. She has also had some abdominal cramping in the morning when she wakes up, but it subsides after she takes her medication. Also states that she has a slightly scratchy throat and is coughing up some mucous but that it has improved since yesterday.   Have you tolerated food without any problems? Yes.    Have you been able to return to your normal activities? Yes.    Do you have any questions about your discharge instructions: Diet   No. Medications  No. Follow up visit  No.  Do you have questions or concerns about your Care? No.  Actions: * If pain score is 4 or above: No action needed, pain <4.      1. Have you developed a fever since your procedure? no  2.   Have you had an respiratory symptoms (SOB or cough) since your procedure? no  3.   Have you tested positive for COVID 19 since your procedure no  4.   Have you had any family members/close contacts diagnosed with the COVID 19 since your procedure?  no   If yes to any of these questions please route to Joylene John, RN and Joella Prince, RN

## 2020-06-30 NOTE — Telephone Encounter (Signed)
Ok to schedule hemorrhoidal banding, next available appointment if patient wants to do it for symptomatic hemorrhoids. Thanks

## 2020-06-30 NOTE — Telephone Encounter (Signed)
Patient called said she was told to call to schedule banding please confirm.

## 2020-06-30 NOTE — Telephone Encounter (Signed)
I was unable to find anything indicating you wanted her scheduled. Does she need an appointment for banding?

## 2020-07-01 ENCOUNTER — Other Ambulatory Visit: Payer: Self-pay

## 2020-07-01 MED ORDER — HYDROCORTISONE (PERIANAL) 2.5 % EX CREA: 1.0000 "application " | TOPICAL_CREAM | Freq: Two times a day (BID) | CUTANEOUS | 0 refills | Status: DC | PRN

## 2020-07-01 NOTE — Telephone Encounter (Signed)
Spoke with the patient. She is experiencing symptoms of discomfort and itching/burning with her hemorrhoids. Discussed self-care including diet, water intake, and rectal care. Guided to the web site "www.[facility].com" for information on the banding procedure. Scheduled the patient for 09/17/20 at 3:30 pm. Refilled the Anusol-HC.

## 2020-07-01 NOTE — Telephone Encounter (Signed)
Ok, thank you

## 2020-07-02 MED ORDER — METHOCARBAMOL 500 MG PO TABS: 500.0000 mg | ORAL_TABLET | Freq: Two times a day (BID) | ORAL | 1 refills | Status: DC | PRN

## 2020-07-02 NOTE — Telephone Encounter (Signed)
Spoke with patient about her symptoms.  She was recently diagnosed with ulcers.  Unable to take ibuprofen.  Still having ongoing pain.  Will refill Robaxin and likely procedure injection.  Rosemarie Ax, MD [facility] 07/02/2020, 8:09 AM

## 2020-07-15 ENCOUNTER — Encounter: Payer: Self-pay | Admitting: Family Medicine

## 2020-07-15 ENCOUNTER — Ambulatory Visit (INDEPENDENT_AMBULATORY_CARE_PROVIDER_SITE_OTHER): Payer: Self-pay | Admitting: Family Medicine

## 2020-07-15 ENCOUNTER — Ambulatory Visit: Payer: Self-pay

## 2020-07-15 ENCOUNTER — Other Ambulatory Visit: Payer: Self-pay

## 2020-07-15 VITALS — BP 110/80 | Ht 62.0 in | Wt 133.0 lb

## 2020-07-15 DIAGNOSIS — M5412 Radiculopathy, cervical region: Secondary | ICD-10-CM

## 2020-07-15 DIAGNOSIS — M7551 Bursitis of right shoulder: Secondary | ICD-10-CM | POA: Insufficient documentation

## 2020-07-15 MED ORDER — METHYLPREDNISOLONE ACETATE 40 MG/ML IJ SUSP
40.0000 mg | Freq: Once | INTRAMUSCULAR | Status: AC
Start: 2020-07-15 — End: 2020-07-15
  Administered 2020-07-15: 40 mg via INTRA_ARTICULAR

## 2020-07-15 NOTE — Assessment & Plan Note (Signed)
Pain is still severe and seems most consistent with radicular pain.  She is unable to take ibuprofen due to ulcers. -Counseled on home exercise therapy and supportive care. -MRI to evaluate for nerve impingement for the consideration of epidural.

## 2020-07-15 NOTE — Progress Notes (Signed)
Katie Benson - 41 y.o. female MRN [id number]  Date of birth: 1980-09-03  SUBJECTIVE:  Including CC & ROS.  No chief complaint on file.   Katie Benson is a 40 y.o. female that is presenting with worsening of her right shoulder and arm pain.  She was told to stop all ibuprofen as she is suffering from ulcers.  The pain is gotten severe in nature.   Review of Systems See HPI   HISTORY: Past Medical, Surgical, Social, and Family History Reviewed & Updated per EMR.   Pertinent Historical Findings include:  Past Medical History:  Diagnosis Date  . Anemia   . Arthritis   . Fibroids   . GERD (gastroesophageal reflux disease)   . History of kidney stones   . Neck injury     Past Surgical History:  Procedure Laterality Date  . CESAREAN SECTION    . IR ANGIOGRAM PELVIS SELECTIVE OR SUPRASELECTIVE  03/18/2018  . IR ANGIOGRAM PELVIS SELECTIVE OR SUPRASELECTIVE  03/18/2018  . IR ANGIOGRAM SELECTIVE EACH ADDITIONAL VESSEL  03/18/2018  . IR ANGIOGRAM SELECTIVE EACH ADDITIONAL VESSEL  03/18/2018  . IR EMBO TUMOR ORGAN ISCHEMIA INFARCT INC GUIDE ROADMAPPING  03/18/2018  . IR RADIOLOGIST EVAL & MGMT  10/17/2017  . IR RADIOLOGIST EVAL & MGMT  12/20/2017  . IR RADIOLOGIST EVAL & MGMT  04/17/2018  . IR US GUIDE VASC ACCESS RIGHT  03/18/2018  . KNEE ARTHROSCOPY WITH LATERAL MENISECTOMY Left 12/24/2018   Procedure: LEFT KNEE ARTHROSCOPY WITH LATERAL MENISECTOM, debriment of GANGLION CYST;  Surgeon: [name], MD;  Location: [facility];  Service: Orthopedics;  Laterality: Left;  . PARATHYROIDECTOMY N/A 02/12/2018   Procedure: PARATHYROIDECTOMY;  Surgeon: Fredirick Maudlin, MD;  Location: [facility];  Service: General;  Laterality: N/A;  . WISDOM TOOTH EXTRACTION     age 26    Family History  Problem Relation Age of Onset  . Diabetes Sister   . Cancer Maternal Grandmother        ovarian  . Hypercalcemia Mother   . Other Father        HX Unknown    Social History    Socioeconomic History  . Marital status: Single    Spouse name: Not on file  . Number of children: 1  . Years of education: Not on file  . Highest education level: Not on file  Occupational History  . Occupation: unemployed  Tobacco Use  . Smoking status: Current Some Day Smoker    Packs/day: 0.25    Years: 14.00    Pack years: 3.50    Types: Cigarettes  . Smokeless tobacco: Never Used  Vaping Use  . Vaping Use: Never used  Substance and Sexual Activity  . Alcohol use: Yes    Comment: occassionally on weekends  . Drug use: Yes    Types: Marijuana    Comment: multiple times a day  . Sexual activity: Yes    Partners: Male    Birth control/protection: Condom  Other Topics Concern  . Not on file  Social History Narrative  . Not on file   Social Determinants of Health   Financial Resource Strain: Not on file  Food Insecurity: Not on file  Transportation Needs: Not on file  Physical Activity: Not on file  Stress: Not on file  Social Connections: Not on file  Intimate Partner Violence: Not on file     PHYSICAL EXAM:  VS: BP 110/80 (BP Location: Left Arm, Patient Position: Sitting, Cuff Size:  Normal)   Ht 5\' 2"  (1.575 m)   Wt 133 lb (60.3 kg)   BMI 24.33 kg/m  Physical Exam Gen: NAD, alert, cooperative with exam, well-appearing MSK:  Right shoulder and arm: Normal passive range of motion. Normal elbow range of motion. Neurovascular intact  Limited ultrasound: Right shoulder and elbow:  Right shoulder: Normal-appearing biceps tendon. Normal-appearing subscapularis. Supraspinatus with mild overlying bursitis. No change in the posterior glenohumeral joint.  Right elbow: No effusion noted at the joint. No changes at the origin of the common extensors  Summary: Subacromial bursitis  Ultrasound and interpretation by Clearance Coots, MD   Aspiration/Injection Procedure Note Katie Benson [birth year]  Procedure: Injection Indications: Right  shoulder pain  Procedure Details Consent: Risks of procedure as well as the alternatives and risks of each were explained to the (patient/caregiver).  Consent for procedure obtained. Time Out: Verified patient identification, verified procedure, site/side was marked, verified correct patient position, special equipment/implants available, medications/allergies/relevent history reviewed, required imaging and test results available.  Performed.  The area was cleaned with iodine and alcohol swabs.    The right subacromial space was injected using 1 cc's of 40 mg Depo-Medrol and 4 cc's of 0.25% bupivacaine with a 22 1 1/2" needle.  Ultrasound was used. Images were obtained in long views showing the injection.     A sterile dressing was applied.  Patient did tolerate procedure well.    ASSESSMENT & PLAN:   Cervical radiculopathy Pain is still severe and seems most consistent with radicular pain.  She is unable to take ibuprofen due to ulcers. -Counseled on home exercise therapy and supportive care. -MRI to evaluate for nerve impingement for the consideration of epidural.  Subacromial bursitis of right shoulder joint Does have a bursitis observed on ultrasound.  May be contributing to some of her pain but still most of the pain seems radicular. -Counseled on home exercise therapy and supportive care. -Injection today.

## 2020-07-15 NOTE — Patient Instructions (Signed)
Good to see you Please try heat  Please call [phone] to schedule the MRI   Please send me a message in MyChart with any questions or updates.  We will setup a virtual visit once the MRI is resulted.   --Dr. Raeford Razor

## 2020-07-15 NOTE — Assessment & Plan Note (Signed)
Does have a bursitis observed on ultrasound.  May be contributing to some of her pain but still most of the pain seems radicular. -Counseled on home exercise therapy and supportive care. -Injection today.

## 2020-07-16 ENCOUNTER — Encounter: Payer: Self-pay | Admitting: Gastroenterology

## 2020-07-24 ENCOUNTER — Ambulatory Visit (HOSPITAL_BASED_OUTPATIENT_CLINIC_OR_DEPARTMENT_OTHER): Admission: RE | Admit: 2020-07-24 | Payer: Self-pay | Source: Ambulatory Visit

## 2020-07-27 ENCOUNTER — Encounter: Payer: Self-pay | Admitting: Family Medicine

## 2020-07-27 NOTE — Addendum Note (Signed)
Addended by: Cresenciano Lick on: 07/27/2020 09:00 AM   Modules accepted: Orders

## 2020-07-31 ENCOUNTER — Ambulatory Visit (HOSPITAL_BASED_OUTPATIENT_CLINIC_OR_DEPARTMENT_OTHER)
Admission: RE | Admit: 2020-07-31 | Discharge: 2020-07-31 | Disposition: A | Discharge status: Home / Self Care | Payer: Self-pay | Source: Ambulatory Visit | Attending: Family Medicine | Admitting: Family Medicine

## 2020-07-31 ENCOUNTER — Other Ambulatory Visit: Payer: Self-pay

## 2020-07-31 DIAGNOSIS — M5412 Radiculopathy, cervical region: Secondary | ICD-10-CM | POA: Insufficient documentation

## 2020-08-02 ENCOUNTER — Telehealth (INDEPENDENT_AMBULATORY_CARE_PROVIDER_SITE_OTHER): Payer: Self-pay | Admitting: Family Medicine

## 2020-08-02 ENCOUNTER — Other Ambulatory Visit: Payer: Self-pay

## 2020-08-02 DIAGNOSIS — M5412 Radiculopathy, cervical region: Secondary | ICD-10-CM

## 2020-08-02 NOTE — Progress Notes (Signed)
Virtual Visit via Video Note  I connected with Katie Benson on 08/02/20 at  1:20 PM EDT by a video enabled telemedicine application and verified that I am speaking with the correct person using two identifiers.  Location: Patient: vehicle Provider: office   I discussed the limitations of evaluation and management by telemedicine and the availability of in person appointments. The patient expressed understanding and agreed to proceed.  History of Present Illness:  Katie Benson is a 40 year old female who is following up after the MRI for cervical spine.  This is showing impingement at different levels.  She is still experiencing some of the pain down the right arm.  The steroid injection of the shoulder did help some.   Observations/Objective:  Gen: NAD, alert, cooperative with exam, well-appearing  Assessment and Plan:  Cervical radiculopathy: Cervical MRI was demonstrating nerve impingement at different levels -Counseled on home exercise therapy and supportive care. -Epidural  Follow Up Instructions:    I discussed the assessment and treatment plan with the patient. The patient was provided an opportunity to ask questions and all were answered. The patient agreed with the plan and demonstrated an understanding of the instructions.   The patient was advised to call back or seek an in-person evaluation if the symptoms worsen or if the condition fails to improve as anticipated.   Katie Coots, MD

## 2020-08-02 NOTE — Assessment & Plan Note (Signed)
Cervical MRI was demonstrating nerve impingement at different levels -Counseled on home exercise therapy and supportive care. -Epidural

## 2020-08-17 ENCOUNTER — Other Ambulatory Visit: Payer: Self-pay | Admitting: Gastroenterology

## 2020-08-23 ENCOUNTER — Ambulatory Visit (INDEPENDENT_AMBULATORY_CARE_PROVIDER_SITE_OTHER): Payer: Self-pay | Admitting: Physical Medicine and Rehabilitation

## 2020-08-23 ENCOUNTER — Encounter: Payer: Self-pay | Admitting: Physical Medicine and Rehabilitation

## 2020-08-23 ENCOUNTER — Other Ambulatory Visit: Payer: Self-pay

## 2020-08-23 ENCOUNTER — Ambulatory Visit: Payer: Self-pay

## 2020-08-23 VITALS — BP 112/72 | HR 81

## 2020-08-23 DIAGNOSIS — M5412 Radiculopathy, cervical region: Secondary | ICD-10-CM

## 2020-08-23 MED ORDER — BETAMETHASONE SOD PHOS & ACET 6 (3-3) MG/ML IJ SUSP
12.0000 mg | Freq: Once | INTRAMUSCULAR | Status: AC
  Administered 2020-08-23: 12 mg

## 2020-08-23 NOTE — Procedures (Signed)
Cervical Epidural Steroid Injection - Interlaminar Approach with Fluoroscopic Guidance  Patient: Katie Benson      Date of Birth: 29-Jan-1981 MRN: [id number] PCP: Patient, No Pcp Per (Inactive)      Visit Date: 08/23/2020   Universal Protocol:    Date/Time: 06/27/225:27 AM  Consent Given By: the patient  Position: PRONE  Additional Comments: Vital signs were monitored before and after the procedure. Patient was prepped and draped in the usual sterile fashion. The correct patient, procedure, and site was verified.   Injection Procedure Details:   Procedure diagnoses: Cervical radiculopathy [M54.12]    Meds Administered: No orders of the defined types were placed in this encounter.    Laterality: Right  Location/Site: C7-T1  Needle: 3.5 in., 20 ga. Tuohy  Needle Placement: Paramedian epidural space  Findings:  -Comments: Excellent flow of contrast into the epidural space.  I did have somewhat of a difficult time because of the amount of muscle tension.  She really was resisting injection with clenched fist etc.  She was not really experiencing a lot of pain however although she did endorse that she was very tense.  She actually did well with the injection itself.  In the future I would look at something like Valium or triazolam.  Procedure Details: Using a paramedian approach from the side mentioned above, the region overlying the inferior lamina was localized under fluoroscopic visualization and the soft tissues overlying this structure were infiltrated with 4 ml. of 1% Lidocaine without Epinephrine. A # 20 gauge, Tuohy needle was inserted into the epidural space using a paramedian approach.  The epidural space was localized using loss of resistance along with contralateral oblique bi-planar fluoroscopic views.  After negative aspirate for air, blood, and CSF, a 2 ml. volume of Isovue-250 was injected into the epidural space and the flow of contrast was observed.  Radiographs were obtained for documentation purposes.   The injectate was administered into the level noted above.  Additional Comments:  The patient tolerated the procedure well Dressing: 2 x 2 sterile gauze and Band-Aid    Post-procedure details: Patient was observed during the procedure. Post-procedure instructions were reviewed.  Patient left the clinic in stable condition.

## 2020-08-23 NOTE — Progress Notes (Signed)
Pt state she believes that her left knee has her off balance and has shifted her hips that makes her back hurt and travels up to her neck. Pt state her neck pain travels down her right arm and elbow the pain makes her arm shakes.Pt state any movement makes the pain worse. Pt state it hurts just keeping her neck hurts. Pt state she takes over the counter pain meds to help ease her pain.  Numeric Pain Rating Scale and Functional Assessment Average Pain 10   In the last MONTH (on 0-10 scale) has pain interfered with the following?  1. General activity like being  able to carry out your everyday physical activities such as walking, climbing stairs, carrying groceries, or moving a chair?  Rating(10)   +Driver, -BT, -Dye Allergies.

## 2020-08-23 NOTE — Patient Instructions (Signed)

## 2020-08-23 NOTE — Progress Notes (Signed)
Katie Benson - 40 y.o. female MRN [id number]  Date of birth: October 26, 1980  Office Visit Note: Visit Date: 08/23/2020 PCP: Patient, No Pcp Per (Inactive) Referred by: Katie Ax, Benson  Subjective: Chief Complaint  Patient presents with   Neck - Pain   Left Arm - Pain   Left Hip - Pain   Left Knee - Pain   Lower Back - Pain   HPI:  Katie Benson is a 40 y.o. female who comes in today at the request of Katie Benson for planned Right C7-T1 Cervical Interlaminar epidural steroid injection with fluoroscopic guidance.  The patient has failed conservative care including home exercise, medications, time and activity modification.  This injection will be diagnostic and hopefully therapeutic.  Please see requesting physician notes for further details and justification. MRI reviewed with images and spine model.  MRI reviewed in the note below.  She describes a combination of neck pain and anterior shoulder pain with referral in a global fashion down to the right hand.  Nothing on the left.  When really talking to her for details it is more of a C6 distribution perhaps C7.  She does have spondylitic change at C5-6 and C6-7 with right more than left narrowing and disc osteophyte but no high-grade compression or stenosis.  Depending on relief consider repeat injection if beneficial and long-lasting.  If no relief she may do well with regrouping with physical therapist for dry needling and myofascial pain evaluation and management.  She did get some relief with prior shoulder injection by Katie Benson.     ROS Otherwise per HPI.  Assessment & Plan: Visit Diagnoses:    ICD-10-CM   1. Cervical radiculopathy  M54.12 XR C-ARM NO REPORT    Epidural Steroid injection    betamethasone acetate-betamethasone sodium phosphate (CELESTONE) injection 12 mg      Plan: No additional findings.   Meds & Orders:  Meds ordered this encounter  Medications   betamethasone  acetate-betamethasone sodium phosphate (CELESTONE) injection 12 mg    Orders Placed This Encounter  Procedures   XR C-ARM NO REPORT   Epidural Steroid injection    Follow-up: Return for Katie Benson.   Procedures: No procedures performed  Cervical Epidural Steroid Injection - Interlaminar Approach with Fluoroscopic Guidance  Patient: Katie Benson      Date of Birth: 1980/06/27 MRN: [id number] PCP: Patient, No Pcp Per (Inactive)      Visit Date: 08/23/2020   Universal Protocol:    Date/Time: 06/27/225:27 AM  Consent Given By: the patient  Position: PRONE  Additional Comments: Vital signs were monitored before and after the procedure. Patient was prepped and draped in the usual sterile fashion. The correct patient, procedure, and site was verified.   Injection Procedure Details:   Procedure diagnoses: Cervical radiculopathy [M54.12]    Meds Administered: No orders of the defined types were placed in this encounter.    Laterality: Right  Location/Site: C7-T1  Needle: 3.5 in., 20 ga. Tuohy  Needle Placement: Paramedian epidural space  Findings:  -Comments: Excellent flow of contrast into the epidural space.  I did have somewhat of a difficult time because of the amount of muscle tension.  She really was resisting injection with clenched fist etc.  She was not really experiencing a lot of pain however although she did endorse that she was very tense.  She actually did well with the injection itself.  In the future I would look at something like  Valium or triazolam.  Procedure Details: Using a paramedian approach from the side mentioned above, the region overlying the inferior lamina was localized under fluoroscopic visualization and the soft tissues overlying this structure were infiltrated with 4 ml. of 1% Lidocaine without Epinephrine. A # 20 gauge, Tuohy needle was inserted into the epidural space using a paramedian approach.  The epidural space was  localized using loss of resistance along with contralateral oblique bi-planar fluoroscopic views.  After negative aspirate for air, blood, and CSF, a 2 ml. volume of Isovue-250 was injected into the epidural space and the flow of contrast was observed. Radiographs were obtained for documentation purposes.   The injectate was administered into the level noted above.  Additional Comments:  The patient tolerated the procedure well Dressing: 2 x 2 sterile gauze and Band-Aid    Post-procedure details: Patient was observed during the procedure. Post-procedure instructions were reviewed.  Patient left the clinic in stable condition.   Clinical History: MRI CERVICAL SPINE WITHOUT CONTRAST   TECHNIQUE: Multiplanar, multisequence MR imaging of the cervical spine was performed. No intravenous contrast was administered.   COMPARISON:  CT scan 12/26/2012   FINDINGS: Alignment: No vertebral subluxation is observed.   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 Disc desiccation at all levels between C2 and C7.   Cord: No significant abnormal spinal cord signal is observed.   Posterior Fossa, vertebral arteries, paraspinal tissues: Unremarkable   Disc levels:   C2-3: Unremarkable.   C3-4: Borderline bilateral foraminal stenosis due to disc bulge and uncinate spurring.   C4-5: Moderate to prominent bilateral foraminal stenosis and borderline central narrowing of the thecal sac due to disc bulge and uncinate spurring.   C5-6: Moderate right and moderate to prominent left foraminal stenosis and borderline central narrowing of the thecal sac due to disc bulge and uncinate spurring.   C6-7: Mild right and moderate left foraminal stenosis due to disc bulge and uncinate spurring.   C7-T1: No impingement.  Minimal uncinate spurring.   T1-2: Unremarkable.   IMPRESSION: 1. Cervical spondylosis, degenerative disc disease, and mildly congenitally short pedicles  contribute to moderate to prominent impingement at C4-5; moderate impingement at C5-6; and mild impingement at C6-7, as detailed above.     Electronically Signed   By: Katie Benson M.D.   On: 08/01/2020 09:25     Objective:  VS:  HT:    WT:   BMI:     BP:112/72  HR:81bpm  TEMP: ( )  RESP:  Physical Exam Vitals and nursing note reviewed.  Constitutional:      General: She is not in acute distress.    Appearance: Normal appearance. She is not ill-appearing.  HENT:     Head: Normocephalic and atraumatic.     Right Ear: External ear normal.     Left Ear: External ear normal.  Eyes:     Extraocular Movements: Extraocular movements intact.  Cardiovascular:     Rate and Rhythm: Normal rate.     Pulses: Normal pulses.  Musculoskeletal:     Cervical back: Tenderness present. No rigidity.     Right lower leg: No edema.     Left lower leg: No edema.     Comments: Patient with very tight trapezius and levator scapula muscles with positive trigger points in these areas.  Patient has good strength in the upper extremities including 5 out of 5 strength in wrist extension long finger flexion and APB.  There is no atrophy of the  hands intrinsically.  There is a negative Hoffmann's test.   Lymphadenopathy:     Cervical: No cervical adenopathy.  Skin:    Findings: No erythema, lesion or rash.  Neurological:     General: No focal deficit present.     Mental Status: She is alert and oriented to person, place, and time.     Sensory: No sensory deficit.     Motor: No weakness or abnormal muscle tone.     Coordination: Coordination normal.  Psychiatric:        Mood and Affect: Mood normal.        Behavior: Behavior normal.     Imaging: XR C-ARM NO REPORT  Result Date: 08/23/2020 Please see Notes tab for imaging impression.

## 2020-08-31 ENCOUNTER — Ambulatory Visit: Payer: Self-pay | Attending: Internal Medicine | Admitting: Internal Medicine

## 2020-08-31 ENCOUNTER — Other Ambulatory Visit: Payer: Self-pay

## 2020-08-31 DIAGNOSIS — Z7689 Persons encountering health services in other specified circumstances: Secondary | ICD-10-CM

## 2020-08-31 DIAGNOSIS — K0889 Other specified disorders of teeth and supporting structures: Secondary | ICD-10-CM

## 2020-08-31 DIAGNOSIS — M25562 Pain in left knee: Secondary | ICD-10-CM

## 2020-08-31 DIAGNOSIS — G8929 Other chronic pain: Secondary | ICD-10-CM

## 2020-08-31 DIAGNOSIS — M6289 Other specified disorders of muscle: Secondary | ICD-10-CM

## 2020-08-31 MED ORDER — CYCLOBENZAPRINE HCL 5 MG PO TABS: 5.0000 mg | ORAL_TABLET | Freq: Every day | ORAL | 1 refills | Status: DC | PRN

## 2020-08-31 NOTE — Progress Notes (Signed)
Patient ID: Katie Benson, female   DOB: 29-Sep-1980, 40 y.o.   MRN: [id number]  Virtual Visit via Telephone Note  I connected with Katie Benson on 08/31/2020 at 1:53 PM by telephone and verified that I am speaking with the correct person using two identifiers  Location: Patient: home Provider: office  Participants: Myself Patient   I discussed the limitations, risks, security and privacy concerns of performing an evaluation and management service by telephone and the availability of in person appointments. I also discussed with the patient that there may be a patient responsible charge related to this service. The patient expressed understanding and agreed to proceed.   History of Present Illness: This is a new pt visit to est care. Pt with hx of hyperparathyroid s/p parathyroidectomy, fibroid, tob dep, cervical radiculopathy Previous PCP was Lavell Anchors, NP  C/o problem of LT knee.  Hx of torn meniscus.  Had arthroscopy surgery by Dr. Durward Fortes 11/2018.  Since then, the knee has remained swollen and painful.  It hurts to bend down.  She has the orange card/cone discount card.  She would like to get in with orthopedics but prefers not to go back to Dr. Durward Fortes.  Also complains of dental pain on and off in the left lower jaw.  The area is sensitive to cold.  She would like to be referred to see a dentist.  She is also requesting to be placed on a different muscle relaxant.  Currently on Robaxin.  She has issues with cervical radiculopathy.  She has had injections to the neck by Dr. Ernestina Patches.  He told her that her muscles are very tight and that she needs to try a different muscle relaxant.   Observations/Objective: No direct observation done as this was a telephone encounter.  Assessment and Plan: 1. Encounter to establish care  2. Chronic pain of left knee I will refer her to orthopedics. - Ambulatory referral to Orthopedic Surgery  3. Pain, dental - Ambulatory  referral to Dentistry  4. Muscle tightness Stop Robaxin.  Changed to Flexeril.  Advised patient that the Flexeril can cause some drowsiness.  She may want to take the medicine for the first time while she is at home to see how it makes her feel. - cyclobenzaprine (FLEXERIL) 5 MG tablet; Take 1 tablet (5 mg total) by mouth daily as needed for muscle spasms.  Dispense: 30 tablet; Refill: 1   Follow Up Instructions: 2 mths   I discussed the assessment and treatment plan with the patient. The patient was provided an opportunity to ask questions and all were answered. The patient agreed with the plan and demonstrated an understanding of the instructions.   The patient was advised to call back or seek an in-person evaluation if the symptoms worsen or if the condition fails to improve as anticipated.  I  Spent 17 minutes on this telephone encounter  Karle Plumber, MD

## 2020-09-17 ENCOUNTER — Ambulatory Visit (INDEPENDENT_AMBULATORY_CARE_PROVIDER_SITE_OTHER): Payer: Self-pay | Admitting: Gastroenterology

## 2020-09-17 ENCOUNTER — Encounter: Payer: Self-pay | Admitting: Gastroenterology

## 2020-09-17 VITALS — BP 112/66 | HR 84 | Ht 62.5 in | Wt 141.0 lb

## 2020-09-17 DIAGNOSIS — K641 Second degree hemorrhoids: Secondary | ICD-10-CM

## 2020-09-17 DIAGNOSIS — K602 Anal fissure, unspecified: Secondary | ICD-10-CM

## 2020-09-17 MED ORDER — AMBULATORY NON FORMULARY MEDICATION: 1 refills | Status: DC

## 2020-09-17 NOTE — Patient Instructions (Signed)
You can use OTC Recticare up to three times a day as needed  We have sent a prescription for nitroglycerin 0.125% gel to [facility]. You should apply a pea size amount to your rectum three times daily x 6-8 weeks.  [facility]'s information is below: Address: [address] [place], [place] [address]  [phone]  *Please DO NOT go directly from our office to pick up this medication! Give the pharmacy 1 day to process the prescription as this is compounded and takes time to make.   If you are age 62 or older, your body mass index should be between 23-30. Your Body mass index is 25.38 kg/m. If this is out of the aforementioned range listed, please consider follow up with your Primary Care Provider.  If you are age 51 or younger, your body mass index should be between 19-25. Your Body mass index is 25.38 kg/m. If this is out of the aformentioned range listed, please consider follow up with your Primary Care Provider.   __________________________________________________________  The [facility] providers would like to encourage you to use [facility] to communicate with providers for non-urgent requests or questions.  Due to long hold times on the telephone, sending your provider a message by [facility] may be a faster and more efficient way to get a response.  Please allow 48 business hours for a response.  Please remember that this is for non-urgent requests.    Due to recent changes in healthcare laws, you may see the results of your imaging and laboratory studies on MyChart before your provider has had a chance to review them.  We understand that in some cases there may be results that are confusing or concerning to you. Not all laboratory results come back in the same time frame and the provider may be waiting for multiple results in order to interpret others.  Please give [place] 48 hours in order for your provider to thoroughly review all the results before contacting the office  for clarification of your results.    Thank you for choosing [facility]  Karleen Hampshire Nandigam,MD

## 2020-09-17 NOTE — Progress Notes (Signed)
PROCEDURE NOTE: The patient presents with symptomatic hemorrhoids, requesting rubber band ligation of his/her hemorrhoidal disease.  All risks, benefits and alternative forms of therapy were described and informed consent was obtained.  In the Left Lateral Decubitus position digital examination revealed anal fissure and grade 1-2 hemorrhoids. Given significant rectal discomfort during digital rectal exam, did not proceed with anoscopic exam or hemorrhoidal band ligation.  The patient was discharged home without pain or other issues.  Dietary and behavioral recommendations were given and along with follow-up instructions.     The following adjunctive treatments were recommended:  Nitroglycerine 0.125% three times daily small pea size amount rectum X 6-8 weeks Recticare small amount per rectum up to TID PRN Benefiber 1 tablespoon BID with meals   The patient will return in 6-8 weeks for  follow-up and possible additional banding as required. No complications were encountered and the patient tolerated the procedure well.  Damaris Hippo , MD [phone]

## 2020-09-22 ENCOUNTER — Encounter: Payer: Self-pay | Admitting: Orthopedic Surgery

## 2020-09-22 ENCOUNTER — Ambulatory Visit (INDEPENDENT_AMBULATORY_CARE_PROVIDER_SITE_OTHER): Payer: Self-pay

## 2020-09-22 ENCOUNTER — Encounter: Payer: Self-pay | Admitting: Internal Medicine

## 2020-09-22 ENCOUNTER — Telehealth: Payer: Self-pay

## 2020-09-22 ENCOUNTER — Ambulatory Visit (INDEPENDENT_AMBULATORY_CARE_PROVIDER_SITE_OTHER): Payer: Self-pay | Admitting: Orthopedic Surgery

## 2020-09-22 DIAGNOSIS — M25462 Effusion, left knee: Secondary | ICD-10-CM

## 2020-09-22 MED ORDER — BUPIVACAINE HCL 0.25 % IJ SOLN
4.0000 mL | INTRAMUSCULAR | Status: AC | PRN
  Administered 2020-09-22: 4 mL via INTRA_ARTICULAR

## 2020-09-22 MED ORDER — TRAMADOL HCL 50 MG PO TABS: 50.0000 mg | ORAL_TABLET | Freq: Two times a day (BID) | ORAL | 0 refills | Status: DC | PRN

## 2020-09-22 MED ORDER — METHYLPREDNISOLONE ACETATE 40 MG/ML IJ SUSP
40.0000 mg | INTRAMUSCULAR | Status: AC | PRN
  Administered 2020-09-22: 40 mg via INTRA_ARTICULAR

## 2020-09-22 MED ORDER — LIDOCAINE HCL 1 % IJ SOLN
5.0000 mL | INTRAMUSCULAR | Status: AC | PRN
  Administered 2020-09-22: 5 mL

## 2020-09-22 NOTE — Telephone Encounter (Signed)
Please submit for left knee gel inj-Dean Patient.
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
## 2020-09-23 NOTE — Telephone Encounter (Signed)
Noted  

## 2020-09-28 ENCOUNTER — Telehealth: Payer: Self-pay | Admitting: Internal Medicine

## 2020-09-28 NOTE — Telephone Encounter (Signed)
-----   Message from Ena Dawley sent at 09/27/2020  8:47 PM EDT ----- Regarding: Dental Referral Good Afternoon   Patient don't have insurance . Mail Dental Resources to patient   09/01/2020  ----- Message ----- From: Ladell Pier, MD Sent: 09/23/2020   6:31 PM EDT To: Ena Dawley  Patient inquiring about dental appointment.  Referral submitted earlier this month.

## 2020-10-07 ENCOUNTER — Emergency Department (HOSPITAL_COMMUNITY): Payer: Self-pay

## 2020-10-07 ENCOUNTER — Other Ambulatory Visit: Payer: Self-pay

## 2020-10-07 ENCOUNTER — Emergency Department (HOSPITAL_COMMUNITY)
Admission: EM | Admit: 2020-10-07 | Discharge: 2020-10-07 | Disposition: A | Discharge status: Home / Self Care | Payer: Self-pay | Attending: Emergency Medicine | Admitting: Emergency Medicine

## 2020-10-07 ENCOUNTER — Encounter (HOSPITAL_COMMUNITY): Payer: Self-pay

## 2020-10-07 DIAGNOSIS — F17210 Nicotine dependence, cigarettes, uncomplicated: Secondary | ICD-10-CM | POA: Insufficient documentation

## 2020-10-07 DIAGNOSIS — M25562 Pain in left knee: Secondary | ICD-10-CM | POA: Insufficient documentation

## 2020-10-07 DIAGNOSIS — W2105XA Struck by basketball, initial encounter: Secondary | ICD-10-CM | POA: Insufficient documentation
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
## 2020-10-07 NOTE — Discharge Instructions (Addendum)
Please continue to wear your knee sleeve and boot as needed for management of your symptoms.  I would recommend continuing with your pain management regimen.  If you develop any new or worsening symptoms please come back to the emergency department.  Otherwise, please follow-up with your regular doctor as well as your orthopedist.  It was a pleasure to meet you.
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
## 2020-10-08 ENCOUNTER — Other Ambulatory Visit: Payer: Self-pay | Admitting: Family Medicine

## 2020-10-26 ENCOUNTER — Telehealth: Payer: Self-pay

## 2020-10-26 NOTE — Telephone Encounter (Signed)
Mailed [employer] application to patient's address listed for Monovisc, left knee injection due to patient not having insurance.   Will fax to [employer] once application is returned back to the office.

## 2020-11-12 ENCOUNTER — Telehealth: Payer: Self-pay | Admitting: Orthopedic Surgery

## 2020-11-12 NOTE — Telephone Encounter (Signed)
Pt called with a question about the application/paperwork that was sent for her to fill out. The pt is currently looking for a place so does not have a permanent address. Pt has daughters address listed as her current address until she has one to add to her chart but pt did not know if she can add her daughter's address or if it must be her current temp address on the paperwork. The best call back number is [phone].

## 2020-11-15 NOTE — Telephone Encounter (Signed)
Yes, it is.  Talked with patient and advised her to use address that she is currently using.  Patient voiced that she understands.

## 2020-11-16 ENCOUNTER — Telehealth: Payer: Self-pay

## 2020-11-16 NOTE — Telephone Encounter (Signed)
Faxed completed [employer] application for Monovisc, left knee to [phone].

## 2020-11-24 ENCOUNTER — Encounter: Payer: Self-pay | Admitting: General Surgery

## 2020-12-14 ENCOUNTER — Ambulatory Visit: Payer: Self-pay | Attending: Internal Medicine | Admitting: Internal Medicine

## 2020-12-14 ENCOUNTER — Other Ambulatory Visit: Payer: Self-pay

## 2020-12-14 ENCOUNTER — Encounter: Payer: Self-pay | Admitting: Internal Medicine

## 2020-12-14 VITALS — BP 117/79 | HR 97 | Resp 16 | Wt 136.0 lb

## 2020-12-14 DIAGNOSIS — M6289 Other specified disorders of muscle: Secondary | ICD-10-CM

## 2020-12-14 DIAGNOSIS — K0889 Other specified disorders of teeth and supporting structures: Secondary | ICD-10-CM

## 2020-12-14 DIAGNOSIS — M1712 Unilateral primary osteoarthritis, left knee: Secondary | ICD-10-CM | POA: Insufficient documentation

## 2020-12-14 DIAGNOSIS — Z2821 Immunization not carried out because of patient refusal: Secondary | ICD-10-CM

## 2020-12-14 DIAGNOSIS — Z599 Problem related to housing and economic circumstances, unspecified: Secondary | ICD-10-CM

## 2020-12-14 MED ORDER — CYCLOBENZAPRINE HCL 5 MG PO TABS
5.0000 mg | ORAL_TABLET | Freq: Every day | ORAL | 1 refills | Status: DC | PRN
  Filled 2020-12-14 – 2021-03-15 (×2): qty 30, 30d supply, fill #0

## 2020-12-14 MED ORDER — TRAMADOL HCL 50 MG PO TABS: 50.0000 mg | ORAL_TABLET | Freq: Two times a day (BID) | ORAL | 0 refills | Status: DC | PRN

## 2020-12-14 MED ORDER — MELOXICAM 15 MG PO TABS
15.0000 mg | ORAL_TABLET | Freq: Every day | ORAL | 1 refills | Status: DC
Start: 2020-12-14 — End: 2022-01-24
  Filled 2020-12-14 – 2021-03-15 (×2): qty 30, 30d supply, fill #0

## 2020-12-14 MED ORDER — GABAPENTIN 100 MG PO CAPS
100.0000 mg | ORAL_CAPSULE | Freq: Three times a day (TID) | ORAL | 1 refills | Status: DC
Start: 2020-12-14 — End: 2021-06-15
  Filled 2020-12-14 – 2021-03-15 (×2): qty 90, 30d supply, fill #0

## 2020-12-14 NOTE — Progress Notes (Signed)
Patient states she would like to talk about medication for appetite.

## 2020-12-14 NOTE — Progress Notes (Signed)
Patient ID: Katie Benson, female    DOB: [birth year]  MRN: [id number]  CC: Dental Pain and Medication Refill   Subjective: Katie Benson is a 40 y.o. female who presents for 2 mth f/u Her concerns today include:  with hx of hyperparathyroid s/p parathyroidectomy, fibroid, tob dep, cervical radiculopathy, OA LT knee  Referred to dentist on last visit in July.  No appt as yet.  [facility] discount lapsed.  She is in the process of reapplying.  She also just submitted application for Medicaid.  She is frustrated because she had tried to renew her [facility] card/[facility] discount card before it lapsed but reports that they would not let her do it.  She has not been able to get her medications since her [facility] discount expired.  She is wondering whether the pharmacy would be willing to work with her today.  She requests refill on tramadol which she states she takes for sleep.  Looks like this was prescribed 1 time as a limited prescription by Dr. Marlou Sa for arthritis pain. She has OA LT knee.   She is also requesting refill on gabapentin.  This was prescribed by Dr. Rosiland Oz from sports medicine for cervical radiculopathy in April of this year.  She would like refill on Flexeril for muscle spasm.    Patient Active Problem List   Diagnosis Date Noted   Subacromial bursitis of right shoulder joint 07/15/2020   Cervical radiculopathy 06/24/2020   Effusion, left knee 06/24/2019   Pes anserine bursitis 04/29/2019   Bucket handle tear of lateral meniscus of left knee 12/12/2018   Ganglion cyst 12/12/2018   Fibroids 03/18/2018   S/P parathyroidectomy ([place]) 02/12/2018   Primary hyperparathyroidism ([place]) 01/08/2018   Hyperparathyroidism , secondary, non-renal ([place]) 01/08/2018   Fibroid uterus 12/06/2017   Hypercalcemia 12/06/2017   Fibroid 10/15/2017   Abnormal uterine bleeding (AUB) 08/20/2017   Anemia 08/20/2017     Current Outpatient Medications on File Prior to Visit  Medication Sig Dispense  Refill   AMBULATORY NON FORMULARY MEDICATION Medication Name:  Nitroglycerin ointment 0.125% three times daily use pea sized amount per rectum for 6-8 weeks 30 g 1   dicyclomine (BENTYL) 20 MG tablet TAKE 1 TABLET BY MOUTH EVERY 6 HOURS AS NEEDED FOR SPASMS 30 tablet 3   hydrocortisone (ANUSOL-HC) 2.5 % rectal cream Place 1 application rectally 2 (two) times daily as needed for hemorrhoids or anal itching. 30 g 0   pantoprazole (PROTONIX) 40 MG tablet Take 1 tablet (40 mg total) by mouth daily. 90 tablet 3   polyethylene glycol (MIRALAX / GLYCOLAX) 17 g packet Take 17 g by mouth daily.     traMADol (ULTRAM) 50 MG tablet Take 1 tablet (50 mg total) by mouth every 12 (twelve) hours as needed. 30 tablet 0   Current Facility-Administered Medications on File Prior to Visit  Medication Dose Route Frequency Provider Last Rate Last Admin   0.9 %  sodium chloride infusion  500 mL Intravenous Once Nandigam, Venia Minks, MD        Allergies  Allergen Reactions   Pork-Derived Products Hives and Swelling    Social History   Socioeconomic History   Marital status: Single    Spouse name: Not on file   Number of children: 1   Years of education: Not on file   Highest education level: Not on file  Occupational History   Occupation: unemployed  Tobacco Use   Smoking status: Some Days    Packs/day:  0.25    Years: 14.00    Pack years: 3.50    Types: Cigarettes   Smokeless tobacco: Never  Vaping Use   Vaping Use: Never used  Substance and Sexual Activity   Alcohol use: Yes    Comment: occassionally on weekends   Drug use: Yes    Types: Marijuana    Comment: multiple times a day   Sexual activity: Yes    Partners: Male    Birth control/protection: Condom  Other Topics Concern   Not on file  Social History Narrative   Not on file   Social Determinants of Health   Financial Resource Strain: Not on file  Food Insecurity: Not on file  Transportation Needs: Not on file  Physical Activity:  Not on file  Stress: Not on file  Social Connections: Not on file  Intimate Partner Violence: Not on file    Family History  Problem Relation Age of Onset   Diabetes Sister    Cancer Maternal Grandmother        ovarian   Hypercalcemia Mother    Other Father        HX Unknown    Past Surgical History:  Procedure Laterality Date   CESAREAN SECTION     IR ANGIOGRAM PELVIS SELECTIVE OR SUPRASELECTIVE  03/18/2018   IR ANGIOGRAM PELVIS SELECTIVE OR SUPRASELECTIVE  03/18/2018   IR ANGIOGRAM SELECTIVE EACH ADDITIONAL VESSEL  03/18/2018   IR ANGIOGRAM SELECTIVE EACH ADDITIONAL VESSEL  03/18/2018   IR EMBO TUMOR ORGAN ISCHEMIA INFARCT INC GUIDE ROADMAPPING  03/18/2018   IR RADIOLOGIST EVAL & MGMT  10/17/2017   IR RADIOLOGIST EVAL & MGMT  12/20/2017   IR RADIOLOGIST EVAL & MGMT  04/17/2018   IR US GUIDE VASC ACCESS RIGHT  03/18/2018   KNEE ARTHROSCOPY WITH LATERAL MENISECTOMY Left 12/24/2018   Procedure: LEFT KNEE ARTHROSCOPY WITH LATERAL MENISECTOM, debriment of GANGLION CYST;  Surgeon: [name], MD;  Location: [facility];  Service: Orthopedics;  Laterality: Left;   PARATHYROIDECTOMY N/A 02/12/2018   Procedure: PARATHYROIDECTOMY;  Surgeon: Fredirick Maudlin, MD;  Location: [facility];  Service: General;  Laterality: N/A;   WISDOM TOOTH EXTRACTION     age 75    ROS: Review of Systems Negative except as stated above  PHYSICAL EXAM: BP 117/79   Pulse 97   Resp 16   Wt 136 lb (61.7 kg)   SpO2 100%   BMI 24.87 kg/m   Physical Exam  General appearance - alert, well appearing, and in no distress Mental status - normal mood, behavior, speech, dress, motor activity, and thought processes Mouth - mucous membranes moist, pharynx normal without lesions.  She points to the space between her second and third molar in the left lower jaw as the culprit area for dental pain.  However I do not see a visible cavity in this area. Musculoskeletal - LT knee: Mild to moderate discomfort with attempted  passive range of motion.  Positive crepitus on passive range of motion.   CMP Latest Ref Rng & Units 03/18/2020 03/18/2018 02/25/2018  Glucose 70 - 99 mg/dL 92 89 87  BUN 6 - 20 mg/dL 11 11 9   Creatinine 0.44 - 1.00 mg/dL 0.71 0.90 0.75  Sodium 135 - 145 mmol/L 137 139 139  Potassium 3.5 - 5.1 mmol/L 4.1 3.6 3.9  Chloride 98 - 111 mmol/L 105 111 107(H)  CO2 22 - 32 mmol/L 22 21(L) 17(L)  Calcium 8.9 - 10.3 mg/dL 9.5 8.6(L) 9.4  Total Protein  6.5 - 8.1 g/dL 6.5 7.0 6.6  Total Bilirubin 0.3 - 1.2 mg/dL 0.9 0.9 <0.2  Alkaline Phos 38 - 126 U/L 66 123 153(H)  AST 15 - 41 U/L 17 19 11   ALT 0 - 44 U/L 16 19 15    Lipid Panel  No results found for: CHOL, TRIG, HDL, CHOLHDL, VLDL, LDLCALC, LDLDIRECT  CBC    Component Value Date/Time   WBC 10.7 (H) 03/18/2020 1208   RBC 4.62 03/18/2020 1208   HGB 10.9 (L) 03/18/2020 1208   HGB 11.2 02/25/2018 1159   HCT 35.1 (L) 03/18/2020 1208   HCT 35.0 02/25/2018 1159   PLT 327 03/18/2020 1208   PLT 339 02/25/2018 1159   MCV 76.0 (L) 03/18/2020 1208   MCV 77 (L) 02/25/2018 1159   MCH 23.6 (L) 03/18/2020 1208   MCHC 31.1 03/18/2020 1208   RDW 16.2 (H) 03/18/2020 1208   RDW 15.9 (H) 02/25/2018 1159   LYMPHSABS 4.0 03/18/2018 0943   LYMPHSABS 3.7 (H) 02/25/2018 1159   MONOABS 0.6 03/18/2018 0943   EOSABS 0.2 03/18/2018 0943   EOSABS 0.2 02/25/2018 1159   BASOSABS 0.1 03/18/2018 0943   BASOSABS 0.1 02/25/2018 1159    ASSESSMENT AND PLAN: 1. Osteoarthritis of left knee, unspecified osteoarthritis type I let her know that tramadol was given by the orthopedics as a 1 time prescription.  I will put her on meloxicam instead for the osteoarthritis - meloxicam (MOBIC) 15 MG tablet; Take 1 tablet (15 mg total) by mouth daily.  Dispense: 30 tablet; Refill: 1  2. Muscle tightness Refill given on Flexeril. - cyclobenzaprine (FLEXERIL) 5 MG tablet; Take 1 tablet (5 mg total) by mouth daily as needed for muscle spasms.  Dispense: 30 tablet; Refill:  1  3. Pain, dental Let me know once she has been approved for the [facility] discount/orange card so that I can resubmit the referral to the dentist  4. Financial difficulties Advised patient to speak with the pharmacy directly to see if they would be able to allow her credit to get her medications  5. Influenza vaccination declined    Patient was given the opportunity to ask questions.  Patient verbalized understanding of the plan and was able to repeat key elements of the plan.   No orders of the defined types were placed in this encounter.    Requested Prescriptions   Signed Prescriptions Disp Refills   gabapentin (NEURONTIN) 100 MG capsule 90 capsule 1    Sig: Take 1 capsule (100 mg total) by mouth 3 (three) times daily.   cyclobenzaprine (FLEXERIL) 5 MG tablet 30 tablet 1    Sig: Take 1 tablet (5 mg total) by mouth daily as needed for muscle spasms.   meloxicam (MOBIC) 15 MG tablet 30 tablet 1    Sig: Take 1 tablet (15 mg total) by mouth daily.    No follow-ups on file.  Karle Plumber, MD, FACP

## 2020-12-15 ENCOUNTER — Ambulatory Visit (INDEPENDENT_AMBULATORY_CARE_PROVIDER_SITE_OTHER): Payer: Self-pay | Admitting: Gastroenterology

## 2020-12-15 ENCOUNTER — Other Ambulatory Visit: Payer: Self-pay

## 2020-12-15 ENCOUNTER — Encounter: Payer: Self-pay | Admitting: Family Medicine

## 2020-12-15 ENCOUNTER — Encounter: Payer: Self-pay | Admitting: Gastroenterology

## 2020-12-15 VITALS — BP 118/58 | HR 91 | Ht 62.0 in | Wt 137.5 lb

## 2020-12-15 DIAGNOSIS — K625 Hemorrhage of anus and rectum: Secondary | ICD-10-CM

## 2020-12-15 DIAGNOSIS — K602 Anal fissure, unspecified: Secondary | ICD-10-CM

## 2020-12-15 DIAGNOSIS — K641 Second degree hemorrhoids: Secondary | ICD-10-CM

## 2020-12-15 DIAGNOSIS — K219 Gastro-esophageal reflux disease without esophagitis: Secondary | ICD-10-CM

## 2020-12-15 DIAGNOSIS — R1084 Generalized abdominal pain: Secondary | ICD-10-CM

## 2020-12-15 DIAGNOSIS — K581 Irritable bowel syndrome with constipation: Secondary | ICD-10-CM

## 2020-12-15 MED ORDER — PANTOPRAZOLE SODIUM 40 MG PO TBEC
40.0000 mg | DELAYED_RELEASE_TABLET | Freq: Every day | ORAL | 3 refills | Status: DC
  Filled 2020-12-15 – 2021-03-15 (×2): qty 30, 30d supply, fill #0
  Filled 2021-06-15 – 2021-06-23 (×2): qty 30, 30d supply, fill #1
  Filled 2021-08-05 – 2021-08-26 (×2): qty 30, 30d supply, fill #2
  Filled 2021-09-27: qty 30, 30d supply, fill #3
  Filled 2021-10-22 – 2021-11-04 (×2): qty 30, 30d supply, fill #4
  Filled 2021-11-27 – 2021-12-02 (×2): qty 30, 30d supply, fill #5

## 2020-12-15 MED ORDER — AMBULATORY NON FORMULARY MEDICATION: 1 refills | Status: AC

## 2020-12-15 MED ORDER — DICYCLOMINE HCL 20 MG PO TABS
ORAL_TABLET | ORAL | 3 refills | Status: DC
Start: 2020-12-15 — End: 2022-01-17
  Filled 2020-12-15 – 2021-03-15 (×2): qty 30, 7d supply, fill #0
  Filled 2021-10-07: qty 30, 7d supply, fill #1
  Filled 2021-10-22 – 2021-11-04 (×2): qty 30, 7d supply, fill #2

## 2020-12-15 NOTE — Progress Notes (Signed)
Katie Benson    [id number]    May 29, 1980  Primary Care Physician:Johnson, Dalbert Batman, MD  Referring Physician: Ladell Pier, MD [address] [place],  [place] [address]   Chief complaint: Anal fissure, hemorrhoids, rectal bleeding, and abdominal pain  HPI:  40 year old very pleasant female here for follow-up for anal fissure, hemorrhoids and abdominal pain    Colonoscopy Jun 28, 2020 - Diverticulosis in the sigmoid colon. - A few erosions in the rectum. Biopsied. - External and internal hemorrhoids. - One 5 mm polyp in the rectum, removed with a cold snare. Resected and retrieved.  EGD Jun 28, 2020 - LA Grade C reflux esophagitis with no bleeding. Biopsied. - Esophageal ulcers. - Normal stomach. - Normal examined duodenum.  1. Surgical [P], esophagus esophagitis and ulcers - GASTROESOPHAGEAL JUNCTION MUCOSA WITH REACTIVE/REGENERATIVE CHANGE. - NEGATIVE FOR INTESTINAL METAPLASIA (GOBLET CELL METAPLASIA). 2. Surgical [P], colon, rectum erosions - COLONIC MUCOSA WITH NO SIGNIFICANT PATHOLOGIC FINDINGS. - NEGATIVE FOR ACTIVE INFLAMMATION AND OTHER ABNORMALITIES. 3. Surgical [P], colon, rectum, polyp (1) - HYPERPLASTIC POLYP.  Overall she feels her symptoms are slightly better.  She is no longer having rectal discomfort.  She continues to have intermittent rectal bleeding but decreased compared to prior.  she had multiple ER visits, most recent in January for lower abdominal pain and rectal bleeding        Family history negative for GI malignancy or IBD.   CT abdomen pelvis with contrast March 18, 2020 showed nonobstructive left renal calculi, moderate amount of colonic stool with no evidence of obstruction or acute pathology   Outpatient Encounter Medications as of 12/15/2020  Medication Sig   cyclobenzaprine (FLEXERIL) 5 MG tablet Take 1 tablet (5 mg total) by mouth daily as needed for muscle spasms.   gabapentin (NEURONTIN) 100 MG  capsule Take 1 capsule (100 mg total) by mouth 3 (three) times daily.   hydrocortisone (ANUSOL-HC) 2.5 % rectal cream Place 1 application rectally 2 (two) times daily as needed for hemorrhoids or anal itching.   meloxicam (MOBIC) 15 MG tablet Take 1 tablet (15 mg total) by mouth daily.   pantoprazole (PROTONIX) 40 MG tablet Take 1 tablet (40 mg total) by mouth daily.   polyethylene glycol (MIRALAX / GLYCOLAX) 17 g packet Take 17 g by mouth daily.   [DISCONTINUED] AMBULATORY NON FORMULARY MEDICATION Medication Name:  Nitroglycerin ointment 0.125% three times daily use pea sized amount per rectum for 6-8 weeks   [DISCONTINUED] dicyclomine (BENTYL) 20 MG tablet TAKE 1 TABLET BY MOUTH EVERY 6 HOURS AS NEEDED FOR SPASMS   [DISCONTINUED] traMADol (ULTRAM) 50 MG tablet Take 1 tablet (50 mg total) by mouth every 12 (twelve) hours as needed.   Facility-Administered Encounter Medications as of 12/15/2020  Medication   0.9 %  sodium chloride infusion    Allergies as of 12/15/2020 - Review Complete 12/15/2020  Allergen Reaction Noted   Pork-derived products Hives and Swelling 03/08/2012    Past Medical History:  Diagnosis Date   Anemia    Arthritis    Fibroids    GERD (gastroesophageal reflux disease)    History of kidney stones    Neck injury     Past Surgical History:  Procedure Laterality Date   CESAREAN SECTION     IR ANGIOGRAM PELVIS SELECTIVE OR SUPRASELECTIVE  03/18/2018   IR ANGIOGRAM PELVIS SELECTIVE OR SUPRASELECTIVE  03/18/2018   IR ANGIOGRAM SELECTIVE EACH ADDITIONAL VESSEL  03/18/2018  IR ANGIOGRAM SELECTIVE EACH ADDITIONAL VESSEL  03/18/2018   IR EMBO TUMOR ORGAN ISCHEMIA INFARCT INC GUIDE ROADMAPPING  03/18/2018   IR RADIOLOGIST EVAL & MGMT  10/17/2017   IR RADIOLOGIST EVAL & MGMT  12/20/2017   IR RADIOLOGIST EVAL & MGMT  04/17/2018   IR US GUIDE VASC ACCESS RIGHT  03/18/2018   KNEE ARTHROSCOPY WITH LATERAL MENISECTOMY Left 12/24/2018   Procedure: LEFT KNEE ARTHROSCOPY WITH  LATERAL MENISECTOM, debriment of GANGLION CYST;  Surgeon: [name], MD;  Location: [facility];  Service: Orthopedics;  Laterality: Left;   PARATHYROIDECTOMY N/A 02/12/2018   Procedure: PARATHYROIDECTOMY;  Surgeon: Fredirick Maudlin, MD;  Location: [facility];  Service: General;  Laterality: N/A;   WISDOM TOOTH EXTRACTION     age 39    Family History  Problem Relation Age of Onset   Diabetes Sister    Cancer Maternal Grandmother        ovarian   Hypercalcemia Mother    Other Father        HX Unknown    Social History   Socioeconomic History   Marital status: Single    Spouse name: Not on file   Number of children: 1   Years of education: Not on file   Highest education level: Not on file  Occupational History   Occupation: unemployed  Tobacco Use   Smoking status: Some Days    Packs/day: 0.25    Years: 14.00    Pack years: 3.50    Types: Cigarettes   Smokeless tobacco: Never  Vaping Use   Vaping Use: Never used  Substance and Sexual Activity   Alcohol use: Yes    Comment: occassionally on weekends   Drug use: Yes    Types: Marijuana    Comment: multiple times a day   Sexual activity: Yes    Partners: Male    Birth control/protection: Condom  Other Topics Concern   Not on file  Social History Narrative   Not on file   Social Determinants of Health   Financial Resource Strain: Not on file  Food Insecurity: Not on file  Transportation Needs: Not on file  Physical Activity: Not on file  Stress: Not on file  Social Connections: Not on file  Intimate Partner Violence: Not on file      Review of systems: All other review of systems negative except as mentioned in the HPI.   Physical Exam: Vitals:   12/15/20 1106  BP: (!) 118/58  Pulse: 91   Body mass index is 25.15 kg/m. Gen:      No acute distress HEENT:  sclera anicteric Abd:      soft, non-tender; no palpable masses, no distension Ext:    No edema Neuro: alert and oriented x 3 Psych:  normal mood and affect  Data Reviewed:  Reviewed labs, radiology imaging, old records and pertinent past GI work up   Assessment and Plan/Recommendations:  40 year old female with history of GERD, hemorrhoids, anal fissure and generalized abdominal discomfort  Anal fissure: Continue small pea-sized amount 0.125% nitroglycerin per rectum 2-3 times daily for additional 6 to 8 weeks Avoid excessive straining during defecation  Will consider hemorrhoidal band ligation if continues to have persistent symptoms from hemorrhoids after developing a fissure  Continue with high-fiber diet and increase water intake to improve bowel habits Use MiraLAX 1 packet daily to prevent constipation Avoid excessive straining with defecation  GERD with erosive esophagitis: Use pantoprazole 40 mg daily  Antireflux measures  Generalized  abdominal discomfort secondary to IBS, CT abdomen pelvis negative for acute pathology Use dicyclomine 20 mg up to 2-3 times daily as needed for severe symptoms  Return in 3 months   The patient was provided an opportunity to ask questions and all were answered. The patient agreed with the plan and demonstrated an understanding of the instructions.  Damaris Hippo , MD    CC: Ladell Pier, MD

## 2020-12-15 NOTE — Patient Instructions (Signed)
We will send refills to your pharmacy  Follow up in 3 months  If you are age 41 or older, your body mass index should be between 23-30. Your Body mass index is 25.15 kg/m. If this is out of the aforementioned range listed, please consider follow up with your Primary Care Provider.  If you are age 42 or younger, your body mass index should be between 19-25. Your Body mass index is 25.15 kg/m. If this is out of the aformentioned range listed, please consider follow up with your Primary Care Provider.   ________________________________________________________  The [facility] providers would like to encourage you to use [facility] to communicate with providers for non-urgent requests or questions.  Due to long hold times on the telephone, sending your provider a message by [facility] may be a faster and more efficient way to get a response.  Please allow 48 business hours for a response.  Please remember that this is for non-urgent requests.  _______________________________________________________   I appreciate the  opportunity to care for you  Thank You   Harl Bowie , MD .

## 2020-12-28 ENCOUNTER — Encounter: Payer: Self-pay | Admitting: Gastroenterology

## 2021-02-14 ENCOUNTER — Encounter: Payer: Self-pay | Admitting: Gastroenterology

## 2021-02-14 ENCOUNTER — Encounter: Payer: Self-pay | Admitting: Internal Medicine

## 2021-02-14 ENCOUNTER — Encounter: Payer: Self-pay | Admitting: Orthopedic Surgery

## 2021-02-14 NOTE — Telephone Encounter (Signed)
Noted.  Will call [employer] to check the status of gel injection and advise patient.

## 2021-02-14 NOTE — Telephone Encounter (Signed)
Next offer her would be gel injection.  If Medicaid does not cover gel injection then she is in the spot where she has to decide if she wants a knee replacement at a very young age or try to manage the pain otherwise.  Please call thanks and if she wants to get another opinion I think that would be great to.

## 2021-02-16 ENCOUNTER — Telehealth: Payer: Self-pay

## 2021-02-16 NOTE — Telephone Encounter (Signed)
Received PRF from [employer] patient assistance. Faxed completed PRF to [employer] [facility] at [phone].

## 2021-03-03 ENCOUNTER — Telehealth: Payer: Self-pay

## 2021-03-03 NOTE — Telephone Encounter (Signed)
Received Monovisc injection from [employer] patient assistance. Appointment has be scheduled with Dr. Marlou Sa.

## 2021-03-07 ENCOUNTER — Ambulatory Visit (INDEPENDENT_AMBULATORY_CARE_PROVIDER_SITE_OTHER): Payer: Self-pay | Admitting: Surgical

## 2021-03-07 ENCOUNTER — Other Ambulatory Visit: Payer: Self-pay

## 2021-03-07 ENCOUNTER — Encounter: Payer: Self-pay | Admitting: Surgical

## 2021-03-07 DIAGNOSIS — M1712 Unilateral primary osteoarthritis, left knee: Secondary | ICD-10-CM

## 2021-03-07 DIAGNOSIS — M25562 Pain in left knee: Secondary | ICD-10-CM

## 2021-03-07 MED ORDER — HYALURONAN 88 MG/4ML IX SOSY
88.0000 mg | PREFILLED_SYRINGE | INTRA_ARTICULAR | Status: AC | PRN
  Administered 2021-03-07: 88 mg via INTRA_ARTICULAR

## 2021-03-07 MED ORDER — LIDOCAINE HCL 1 % IJ SOLN
5.0000 mL | INTRAMUSCULAR | Status: AC | PRN
  Administered 2021-03-07: 5 mL

## 2021-03-07 NOTE — Progress Notes (Signed)
Office Visit Note   Patient: Katie Benson           Date of Birth: 1980/04/26           MRN: [id number] Visit Date: 03/07/2021 Requested by: Ladell Pier, MD [place],  [place] [id number] PCP: Ladell Pier, MD  Subjective: Chief Complaint  Patient presents with   Left Knee - Pain    HPI: Katie Benson is a 41 y.o. female who presents to the office complaining of left knee pain.  Patient has a history of left knee arthritis this.  Last injection was 09/22/2020 which was a cortisone injection.  This provided excellent relief for 2 weeks until she was struck in the knee by a kicked ball from a child.  That her knee pain came back in full force.  She has never had a gel injection before.  Denies any groin pain or radicular pain.  No new symptoms.  She has difficulty walking for long periods of time just as she had in 2022.  She reports she has seen her primary physician for stomach ulcers recently and has lost 40 pounds and down to a weight of around 140..                ROS: All systems reviewed are negative as they relate to the chief complaint within the history of present illness.  Patient denies fevers or chills.  Assessment & Plan: Visit Diagnoses:  1. Unilateral primary osteoarthritis, left knee   2. Left knee pain, unspecified chronicity     Plan: Patient is a 41 year old female who presents for repeat evaluation of left knee arthritis.  She is here today for left knee Monovisc injection.  Previous cortisone injection helped for 2 weeks until she reinjured her knee after being struck by a ball.  She is never had gel injection before.  Gel injection administered and patient tolerated the procedure well.  She will call the office in 2 weeks if she has no relief to try a cortisone injection but otherwise she will follow-up with the office as needed.  Also referred her to physical therapy upstairs for designing a home exercise program for left knee  osteoarthritis.  Follow-Up Instructions: No follow-ups on file.   Orders:  Orders Placed This Encounter  Procedures   Ambulatory referral to Physical Therapy   No orders of the defined types were placed in this encounter.     Procedures: Large Joint Inj on 03/07/2021 3:46 PM Indications: pain, joint swelling and diagnostic evaluation Details: 18 G 1.5 in needle, superolateral approach  Arthrogram: No  Medications: 5 mL lidocaine 1 %; 88 mg Hyaluronan 88 MG/4ML Aspirate: 3 mL blood-tinged Outcome: tolerated well, no immediate complications Procedure, treatment alternatives, risks and benefits explained, specific risks discussed. Consent was given by the patient. Immediately prior to procedure a time out was called to verify the correct patient, procedure, equipment, support staff and site/side marked as required. Patient was prepped and draped in the usual sterile fashion.      Clinical Data: No additional findings.  Objective: Vital Signs: There were no vitals taken for this visit.  Physical Exam:  Constitutional: Patient appears well-developed HEENT:  Head: Normocephalic Eyes:EOM are normal Neck: Normal range of motion Cardiovascular: Normal rate Pulmonary/chest: Effort normal Neurologic: Patient is alert Skin: Skin is warm Psychiatric: Patient has normal mood and affect  Ortho Exam: Ortho exam demonstrates left knee with 3 degree flexion contracture.  [place]  degrees of knee flexion.  No calf tenderness.  Negative Homans' sign.  No pain with hip range of motion.  Tenderness over the medial lateral joint lines moderately.  Able to perform straight leg raise without extensor lag.  Trace effusion noted.  Specialty Comments:  No specialty comments available.  Imaging: No results found.   PMFS History: Patient Active Problem List   Diagnosis Date Noted   Osteoarthritis of left knee 12/14/2020   Subacromial bursitis of right shoulder joint 07/15/2020   Cervical  radiculopathy 06/24/2020   Effusion, left knee 06/24/2019   Pes anserine bursitis 04/29/2019   Bucket handle tear of lateral meniscus of left knee 12/12/2018   Ganglion cyst 12/12/2018   Fibroids 03/18/2018   S/P parathyroidectomy ([place]) 02/12/2018   Primary hyperparathyroidism ([place]) 01/08/2018   Hyperparathyroidism , secondary, non-renal ([place]) 01/08/2018   Fibroid uterus 12/06/2017   Hypercalcemia 12/06/2017   Fibroid 10/15/2017   Abnormal uterine bleeding (AUB) 08/20/2017   Anemia 08/20/2017   Past Medical History:  Diagnosis Date   Anemia    Arthritis    Fibroids    GERD (gastroesophageal reflux disease)    History of kidney stones    Neck injury     Family History  Problem Relation Age of Onset   Diabetes Sister    Cancer Maternal Grandmother        ovarian   Hypercalcemia Mother    Other Father        HX Unknown    Past Surgical History:  Procedure Laterality Date   CESAREAN SECTION     IR ANGIOGRAM PELVIS SELECTIVE OR SUPRASELECTIVE  03/18/2018   IR ANGIOGRAM PELVIS SELECTIVE OR SUPRASELECTIVE  03/18/2018   IR ANGIOGRAM SELECTIVE EACH ADDITIONAL VESSEL  03/18/2018   IR ANGIOGRAM SELECTIVE EACH ADDITIONAL VESSEL  03/18/2018   IR EMBO TUMOR ORGAN ISCHEMIA INFARCT INC GUIDE ROADMAPPING  03/18/2018   IR RADIOLOGIST EVAL & MGMT  10/17/2017   IR RADIOLOGIST EVAL & MGMT  12/20/2017   IR RADIOLOGIST EVAL & MGMT  04/17/2018   IR US GUIDE VASC ACCESS RIGHT  03/18/2018   KNEE ARTHROSCOPY WITH LATERAL MENISECTOMY Left 12/24/2018   Procedure: LEFT KNEE ARTHROSCOPY WITH LATERAL MENISECTOM, debriment of GANGLION CYST;  Surgeon: [name], MD;  Location: [facility];  Service: Orthopedics;  Laterality: Left;   PARATHYROIDECTOMY N/A 02/12/2018   Procedure: PARATHYROIDECTOMY;  Surgeon: Fredirick Maudlin, MD;  Location: [facility];  Service: General;  Laterality: N/A;   WISDOM TOOTH EXTRACTION     age 68   Social History   Occupational History   Occupation: unemployed  Tobacco Use    Smoking status: Some Days    Packs/day: 0.25    Years: 14.00    Pack years: 3.50    Types: Cigarettes   Smokeless tobacco: Never  Vaping Use   Vaping Use: Never used  Substance and Sexual Activity   Alcohol use: Yes    Comment: occassionally on weekends   Drug use: Yes    Types: Marijuana    Comment: multiple times a day   Sexual activity: Yes    Partners: Male    Birth control/protection: Condom

## 2021-03-10 ENCOUNTER — Other Ambulatory Visit: Payer: Self-pay

## 2021-03-15 ENCOUNTER — Other Ambulatory Visit: Payer: Self-pay

## 2021-06-15 ENCOUNTER — Other Ambulatory Visit: Payer: Self-pay | Admitting: Gastroenterology

## 2021-06-15 ENCOUNTER — Other Ambulatory Visit: Payer: Self-pay | Admitting: Internal Medicine

## 2021-06-15 ENCOUNTER — Other Ambulatory Visit: Payer: Self-pay

## 2021-06-15 MED ORDER — HYDROCORTISONE (PERIANAL) 2.5 % EX CREA
1.0000 "application " | TOPICAL_CREAM | Freq: Two times a day (BID) | CUTANEOUS | 0 refills | Status: DC | PRN
  Filled 2021-06-15 – 2021-06-23 (×2): qty 30, 10d supply, fill #0

## 2021-06-16 ENCOUNTER — Other Ambulatory Visit: Payer: Self-pay

## 2021-06-16 ENCOUNTER — Other Ambulatory Visit: Payer: Self-pay | Admitting: Internal Medicine

## 2021-06-16 DIAGNOSIS — M6289 Other specified disorders of muscle: Secondary | ICD-10-CM

## 2021-06-16 MED ORDER — GABAPENTIN 100 MG PO CAPS
100.0000 mg | ORAL_CAPSULE | Freq: Three times a day (TID) | ORAL | 1 refills | Status: DC
  Filled 2021-06-16 – 2021-06-23 (×2): qty 90, 30d supply, fill #0
  Filled 2021-08-05 – 2021-08-26 (×2): qty 90, 30d supply, fill #1

## 2021-06-16 NOTE — Telephone Encounter (Signed)
Requested medication (s) are due for refill today - yes ? ?Requested medication (s) are on the active medication list -yes ? ?Future visit scheduled -no ? ?Last refill: 12/14/20 #30 1RF ? ?Notes to clinic: Request RF: non delegated Rx ? ?Requested Prescriptions  ?Pending Prescriptions Disp Refills  ? cyclobenzaprine (FLEXERIL) 5 MG tablet 30 tablet 1  ?  Sig: Take 1 tablet (5 mg total) by mouth daily as needed for muscle spasms.  ?  ? Not Delegated - Analgesics:  Muscle Relaxants Failed - 06/16/2021  9:35 AM  ?  ?  Failed - This refill cannot be delegated  ?  ?  Failed - Valid encounter within last 6 months  ?  Recent Outpatient Visits   ? ?      ? 6 months ago Osteoarthritis of left knee, unspecified osteoarthritis type  ? [place] Ladell Pier, MD  ? 9 months ago Encounter to establish care  ? [place] Ladell Pier, MD  ? 1 year ago Chronic pain of left knee  ? Primary Care at [facility], Bayard Beaver, MD  ? 2 years ago Chronic pain of left knee  ? Primary Care at [facility], Charlane Ferretti, MD  ? 3 years ago Pre-operative clearance  ? Primary Care at [facility], Carroll Sage, FNP  ? ?  ?  ? ? ?  ?  ?  ? ? ? ?Requested Prescriptions  ?Pending Prescriptions Disp Refills  ? cyclobenzaprine (FLEXERIL) 5 MG tablet 30 tablet 1  ?  Sig: Take 1 tablet (5 mg total) by mouth daily as needed for muscle spasms.  ?  ? Not Delegated - Analgesics:  Muscle Relaxants Failed - 06/16/2021  9:35 AM  ?  ?  Failed - This refill cannot be delegated  ?  ?  Failed - Valid encounter within last 6 months  ?  Recent Outpatient Visits   ? ?      ? 6 months ago Osteoarthritis of left knee, unspecified osteoarthritis type  ? [place] Ladell Pier, MD  ? 9 months ago Encounter to establish care  ? [place] Ladell Pier, MD  ? 1 year ago Chronic pain of left knee  ?  Primary Care at [facility], Bayard Beaver, MD  ? 2 years ago Chronic pain of left knee  ? Primary Care at [facility], Charlane Ferretti, MD  ? 3 years ago Pre-operative clearance  ? Primary Care at [facility], Carroll Sage, FNP  ? ?  ?  ? ? ?  ?  ?  ? ? ? ?

## 2021-06-17 ENCOUNTER — Other Ambulatory Visit: Payer: Self-pay | Admitting: Internal Medicine

## 2021-06-17 ENCOUNTER — Other Ambulatory Visit: Payer: Self-pay

## 2021-06-17 DIAGNOSIS — M6289 Other specified disorders of muscle: Secondary | ICD-10-CM

## 2021-06-17 MED ORDER — CYCLOBENZAPRINE HCL 5 MG PO TABS
5.0000 mg | ORAL_TABLET | Freq: Every day | ORAL | 0 refills | Status: DC | PRN
  Filled 2021-06-17: qty 30, 30d supply, fill #0

## 2021-06-17 NOTE — Telephone Encounter (Signed)
Requested medication (s) are due for refill today: yes ? ?Requested medication (s) are on the active medication list: yes ? ?Last refill:  12/14/20 #30 with 1 RF ? ?Future visit scheduled: 07/07/21, called in Gabapentin yesterday but meant Flexaril ? ?Notes to clinic:  Pt trying to work out insurance, having difficulty, depressed, appt made, this med is not delegated, please assess. ? ? ?  ? ?Requested Prescriptions  ?Pending Prescriptions Disp Refills  ? cyclobenzaprine (FLEXERIL) 5 MG tablet 30 tablet 1  ?  Sig: Take 1 tablet (5 mg total) by mouth daily as needed for muscle spasms.  ?  ? Not Delegated - Analgesics:  Muscle Relaxants Failed - 06/17/2021 12:36 AM  ?  ?  Failed - This refill cannot be delegated  ?  ?  Failed - Valid encounter within last 6 months  ?  Recent Outpatient Visits   ? ?      ? 6 months ago Osteoarthritis of left knee, unspecified osteoarthritis type  ? [place] Ladell Pier, MD  ? 9 months ago Encounter to establish care  ? [place] Ladell Pier, MD  ? 1 year ago Chronic pain of left knee  ? Primary Care at [id number], Bayard Beaver, MD  ? 2 years ago Chronic pain of left knee  ? Primary Care at [facility], Charlane Ferretti, MD  ? 3 years ago Pre-operative clearance  ? Primary Care at [facility], Carroll Sage, FNP  ? ?  ?  ?Future Appointments   ? ?        ? In 2 weeks Thereasa Solo, [place]  ? ?  ? ? ?  ?  ?  ? ? ?

## 2021-06-20 ENCOUNTER — Other Ambulatory Visit: Payer: Self-pay

## 2021-06-21 ENCOUNTER — Other Ambulatory Visit: Payer: Self-pay

## 2021-06-23 ENCOUNTER — Ambulatory Visit (INDEPENDENT_AMBULATORY_CARE_PROVIDER_SITE_OTHER): Payer: Medicaid Other

## 2021-06-23 ENCOUNTER — Encounter: Payer: Self-pay | Admitting: Surgical

## 2021-06-23 ENCOUNTER — Ambulatory Visit (INDEPENDENT_AMBULATORY_CARE_PROVIDER_SITE_OTHER): Payer: Self-pay | Admitting: Surgical

## 2021-06-23 ENCOUNTER — Other Ambulatory Visit: Payer: Self-pay

## 2021-06-23 DIAGNOSIS — M1712 Unilateral primary osteoarthritis, left knee: Secondary | ICD-10-CM

## 2021-06-23 DIAGNOSIS — M545 Low back pain, unspecified: Secondary | ICD-10-CM

## 2021-06-23 DIAGNOSIS — G8929 Other chronic pain: Secondary | ICD-10-CM

## 2021-06-23 MED ORDER — CYCLOBENZAPRINE HCL 10 MG PO TABS
10.0000 mg | ORAL_TABLET | Freq: Two times a day (BID) | ORAL | 0 refills | Status: DC
  Filled 2021-06-23: qty 30, 15d supply, fill #0

## 2021-06-24 NOTE — Progress Notes (Signed)
? ?Office Visit Note ?  ?Patient: Katie Benson           ?Date of Birth: 02-10-1981           ?MRN: [id number] ?Visit Date: 06/23/2021 ?Requested by: Ladell Pier, MD ?[facility] ?[address] ?[place],  [place] [address] ?PCP: Ladell Pier, MD ? ?Subjective: ?Chief Complaint  ?Patient presents with  ? Left Knee - Pain, Follow-up  ? ? ?HPI: Katie Benson is a 41 y.o. female who presents to the office complaining of left knee and low back pain.  Patient complains of continued left knee pain.  She has history of left knee arthritis.  She has had previous cortisone injection with good relief and previous Monovisc injection without much relief.  No new injury.  Increased pain with ambulation.  No mechanical symptoms or instability.  Denies any groin pain.  Does note midline and right-sided low back pain with radiation to her buttocks on the right-hand side.  No consistent numbness and tingling.  No red flag symptoms.Marland Kitchen   ?             ?ROS: All systems reviewed are negative as they relate to the chief complaint within the history of present illness.  Patient denies fevers or chills. ? ?Assessment & Plan: ?Visit Diagnoses:  ?1. Unilateral primary osteoarthritis, left knee   ?2. Chronic low back pain, unspecified back pain laterality, unspecified whether sciatica present   ? ? ?Plan: Patient is a 41 year old female who presents for evaluation of left knee pain and low back pain with radiation to buttocks on the right-hand side.  She has history of left knee osteoarthritis.  She would like to repeat cortisone injection that gave her good relief previously before she was struck in the knee by kicked ball.  Left knee injection was administered and patient tolerated the procedure well.  Regarding her midline low back pain with radiation to the buttocks, recommended physical therapy upstairs for 1 visit to design a home exercise program for lumbar spine exercises.  Follow-up in 6 to 8 weeks for clinical  recheck regarding her knee and back pain.  If no improvement in her back pain at that point, consider MRI. ? ?Follow-Up Instructions: No follow-ups on file.  ? ?Orders:  ?Orders Placed This Encounter  ?Procedures  ? XR Lumbar Spine 2-3 Views  ? XR KNEE 3 VIEW LEFT  ? Ambulatory referral to Physical Therapy  ? ?Meds ordered this encounter  ?Medications  ? cyclobenzaprine (FLEXERIL) 10 MG tablet  ?  Sig: Take 1 tablet (10 mg total) by mouth 2 (two) times daily.  ?  Dispense:  30 tablet  ?  Refill:  0  ? ? ? ? Procedures: ?Large Joint Inj: L knee on 06/23/2021 11:05 AM ?Indications: diagnostic evaluation, joint swelling and pain ?Details: 18 G 1.5 in needle, superolateral approach ? ?Arthrogram: No ? ?Medications: 5 mL lidocaine 1 %; 40 mg methylPREDNISolone acetate 40 MG/ML; 4 mL bupivacaine 0.25 % ?Aspirate: 10 mL ?Outcome: tolerated well, no immediate complications ?Procedure, treatment alternatives, risks and benefits explained, specific risks discussed. Consent was given by the patient. Immediately prior to procedure a time out was called to verify the correct patient, procedure, equipment, support staff and site/side marked as required. Patient was prepped and draped in the usual sterile fashion.  ? ? ? ? ?Clinical Data: ?No additional findings. ? ?Objective: ?Vital Signs: There were no vitals taken for this visit. ? ?Physical Exam:  ?  Constitutional: Patient appears well-developed ?HEENT:  ?Head: Normocephalic ?Eyes:EOM are normal ?Neck: Normal range of motion ?Cardiovascular: Normal rate ?Pulmonary/chest: Effort normal ?Neurologic: Patient is alert ?Skin: Skin is warm ?Psychiatric: Patient has normal mood and affect ? ?Ortho Exam: Ortho exam demonstrates left knee with positive effusion.  Tenderness over the medial lateral joint lines.  Able to perform straight leg raise without extensor lag.  3 degree flexion contracture.  110 degrees of knee flexion.  No calf tenderness.  Negative Homans' sign.  No pain with  hip range of motion.  Tenderness throughout the axial lumbar spine.  Negative straight leg raise.  5/5 motor strength of bilateral hip flexion, quadricep, hamstring, dorsiflexion, plantarflexion. ? ?Specialty Comments:  ?No specialty comments available. ? ?Imaging: ?No results found. ? ? ?PMFS History: ?Patient Active Problem List  ? Diagnosis Date Noted  ? Osteoarthritis of left knee 12/14/2020  ? Subacromial bursitis of right shoulder joint 07/15/2020  ? Cervical radiculopathy 06/24/2020  ? Effusion, left knee 06/24/2019  ? Pes anserine bursitis 04/29/2019  ? Bucket handle tear of lateral meniscus of left knee 12/12/2018  ? Ganglion cyst 12/12/2018  ? Fibroids 03/18/2018  ? S/P parathyroidectomy ([place]) 02/12/2018  ? Primary hyperparathyroidism ([place]) 01/08/2018  ? Hyperparathyroidism , secondary, non-renal ([place]) 01/08/2018  ? Fibroid uterus 12/06/2017  ? Hypercalcemia 12/06/2017  ? Fibroid 10/15/2017  ? Abnormal uterine bleeding (AUB) 08/20/2017  ? Anemia 08/20/2017  ? ?Past Medical History:  ?Diagnosis Date  ? Anemia   ? Arthritis   ? Fibroids   ? GERD (gastroesophageal reflux disease)   ? History of kidney stones   ? Neck injury   ?  ?Family History  ?Problem Relation Age of Onset  ? Diabetes Sister   ? Cancer Maternal Grandmother   ?     ovarian  ? Hypercalcemia Mother   ? Other Father   ?     HX Unknown  ?  ?Past Surgical History:  ?Procedure Laterality Date  ? CESAREAN SECTION    ? IR ANGIOGRAM PELVIS SELECTIVE OR SUPRASELECTIVE  03/18/2018  ? IR ANGIOGRAM PELVIS SELECTIVE OR SUPRASELECTIVE  03/18/2018  ? IR ANGIOGRAM SELECTIVE EACH ADDITIONAL VESSEL  03/18/2018  ? IR ANGIOGRAM SELECTIVE EACH ADDITIONAL VESSEL  03/18/2018  ? IR EMBO TUMOR ORGAN ISCHEMIA INFARCT INC GUIDE ROADMAPPING  03/18/2018  ? IR RADIOLOGIST EVAL & MGMT  10/17/2017  ? IR RADIOLOGIST EVAL & MGMT  12/20/2017  ? IR RADIOLOGIST EVAL & MGMT  04/17/2018  ? IR US GUIDE VASC ACCESS RIGHT  03/18/2018  ? KNEE ARTHROSCOPY WITH LATERAL MENISECTOMY Left  12/24/2018  ? Procedure: LEFT KNEE ARTHROSCOPY WITH LATERAL MENISECTOM, debriment of GANGLION CYST;  Surgeon: [name], MD;  Location: [facility];  Service: Orthopedics;  Laterality: Left;  ? PARATHYROIDECTOMY N/A 02/12/2018  ? Procedure: PARATHYROIDECTOMY;  Surgeon: Fredirick Maudlin, MD;  Location: [facility] OR[facility];  Service: General;  Laterality: N/A;  ? WISDOM TOOTH EXTRACTION    ? age 38  ? ?Social History  ? ?Occupational History  ? Occupation: unemployed  ?Tobacco Use  ? Smoking status: Some Days  ?  Packs/day: 0.25  ?  Years: 14.00  ?  Pack years: 3.50  ?  Types: Cigarettes  ? Smokeless tobacco: Never  ?Vaping Use  ? Vaping Use: Never used  ?Substance and Sexual Activity  ? Alcohol use: Yes  ?  Comment: occassionally on weekends  ? Drug use: Yes  ?  Types: Marijuana  ?  Comment: multiple times a day  ?  Sexual activity: Yes  ?  Partners: Male  ?  Birth control/protection: Condom  ? ? ? ? ?  ?

## 2021-06-26 ENCOUNTER — Encounter: Payer: Self-pay | Admitting: Surgical

## 2021-06-26 MED ORDER — METHYLPREDNISOLONE ACETATE 40 MG/ML IJ SUSP
40.0000 mg | INTRAMUSCULAR | Status: AC | PRN
  Administered 2021-06-23: 40 mg via INTRA_ARTICULAR

## 2021-06-26 MED ORDER — LIDOCAINE HCL 1 % IJ SOLN
5.0000 mL | INTRAMUSCULAR | Status: AC | PRN
  Administered 2021-06-23: 5 mL

## 2021-06-26 MED ORDER — BUPIVACAINE HCL 0.25 % IJ SOLN
4.0000 mL | INTRAMUSCULAR | Status: AC | PRN
  Administered 2021-06-23: 4 mL via INTRA_ARTICULAR

## 2021-07-07 ENCOUNTER — Encounter: Payer: Self-pay | Admitting: Physician Assistant

## 2021-07-07 ENCOUNTER — Ambulatory Visit: Payer: Self-pay | Attending: Physician Assistant | Admitting: Physician Assistant

## 2021-07-07 VITALS — BP 100/75 | HR 94 | Temp 98.9°F | Resp 18 | Ht 62.0 in | Wt 134.0 lb

## 2021-07-07 DIAGNOSIS — K219 Gastro-esophageal reflux disease without esophagitis: Secondary | ICD-10-CM | POA: Insufficient documentation

## 2021-07-07 DIAGNOSIS — Z79899 Other long term (current) drug therapy: Secondary | ICD-10-CM | POA: Insufficient documentation

## 2021-07-07 DIAGNOSIS — R630 Anorexia: Secondary | ICD-10-CM | POA: Insufficient documentation

## 2021-07-07 DIAGNOSIS — M5412 Radiculopathy, cervical region: Secondary | ICD-10-CM | POA: Insufficient documentation

## 2021-07-07 DIAGNOSIS — E892 Postprocedural hypoparathyroidism: Secondary | ICD-10-CM | POA: Insufficient documentation

## 2021-07-07 DIAGNOSIS — K2210 Ulcer of esophagus without bleeding: Secondary | ICD-10-CM | POA: Insufficient documentation

## 2021-07-07 DIAGNOSIS — Z791 Long term (current) use of non-steroidal anti-inflammatories (NSAID): Secondary | ICD-10-CM | POA: Insufficient documentation

## 2021-07-07 DIAGNOSIS — K649 Unspecified hemorrhoids: Secondary | ICD-10-CM | POA: Insufficient documentation

## 2021-07-07 NOTE — Progress Notes (Signed)
Patient has eaten and has not taken medicaton today. ?Patient reports of pain in the stomach and L back pain that is being addressed by PT. ? ?

## 2021-07-07 NOTE — Progress Notes (Signed)
Patient ID: Katie Benson, female   DOB: 1980/04/25, 41 y.o.   MRN: [id number] ? ? ?Katie Benson, is a 41 y.o. female ? ?[id number]:[id number] ? ?[id number] ? ?DOB - 1980-10-14 ? ?Chief Complaint  ?Patient presents with  ? GI Problem  ?    ? ?Subjective:  ? ?Katie Benson is a 41 y.o. female here today several issues.  She sees Amada Jupiter for her knee pain and she is ok with the care she receives there.  She has not been happy with the care she has received for R arm and cervical radiculopathy-she had injection 08/23/2020 and advised to f/up with Dr Shiela Mayer.  Patient expresses frustration that "none of these doctors call her/schedule appointments for her/take her seriously/care about me."  Feels the same about GI doctor.  Last note from GI was to follow up in 3 months.  That was in 11/2020.  She needs to see GI but does not want to see the same one.  Another complicating issue is that she does not currently have financial assistance or health insurance.   ? ?I assured her that I will do my best to help facilitate what she is asking.  She c/o poor appetite/weight loss over the last year.   ? ? ?No problems updated. ? ?ALLERGIES: ?Allergies  ?Allergen Reactions  ? Pork-Derived Products Hives and Swelling  ? ? ?PAST MEDICAL HISTORY: ?Past Medical History:  ?Diagnosis Date  ? Anemia   ? Arthritis   ? Fibroids   ? GERD (gastroesophageal reflux disease)   ? History of kidney stones   ? Neck injury   ? ? ?MEDICATIONS AT HOME: ?Prior to Admission medications   ?Medication Sig Start Date End Date Taking? Authorizing Provider  ?AMBULATORY NON FORMULARY MEDICATION Medication Name:  Nitroglycerin ointment 0.125% three times daily use pea sized amount per rectum for 6-8 weeks 12/15/20  Yes Nandigam, Venia Minks, MD  ?cyclobenzaprine (FLEXERIL) 10 MG tablet Take 1 tablet (10 mg total) by mouth 2 (two) times daily. 06/23/21  Yes Magnant, Gerrianne Scale, PA-C  ?cyclobenzaprine (FLEXERIL) 5 MG tablet Take 1 tablet (5 mg total) by  mouth daily as needed for muscle spasms. 06/17/21  Yes Ladell Pier, MD  ?gabapentin (NEURONTIN) 100 MG capsule Take 1 capsule (100 mg total) by mouth 3 (three) times daily. 06/16/21  Yes Charlott Rakes, MD  ?dicyclomine (BENTYL) 20 MG tablet TAKE 1 TABLET BY MOUTH EVERY 6 HOURS AS NEEDED FOR SPASMS ?Patient not taking: Reported on 07/07/2021 12/15/20   Mauri Pole, MD  ?hydrocortisone (ANUSOL-HC) 2.5 % rectal cream Place 1 application. rectally 2 (two) times daily as needed for hemorrhoids or anal itching. ?Patient not taking: Reported on 07/07/2021 06/15/21   Mauri Pole, MD  ?meloxicam (MOBIC) 15 MG tablet Take 1 tablet (15 mg total) by mouth daily. ?Patient not taking: Reported on 07/07/2021 12/14/20   Ladell Pier, MD  ?pantoprazole (PROTONIX) 40 MG tablet Take 1 tablet (40 mg total) by mouth daily. ?Patient not taking: Reported on 07/07/2021 12/15/20   Mauri Pole, MD  ?polyethylene glycol (MIRALAX / GLYCOLAX) 17 g packet Take 17 g by mouth daily. ?Patient not taking: Reported on 07/07/2021    [provider]  ? ? ?ROS: ?Neg HEENT ?Neg resp ?Neg cardiac ?Neg GU ?Neg psych ?Neg neuro ? ?Objective:  ? ?Vitals:  ? 07/07/21 1350  ?BP: 100/75  ?Pulse: 94  ?Resp: 18  ?Temp: 98.9 ?F (37.2 ?C)  ?TempSrc: Oral  ?SpO2: 100%  ?  Weight: 134 lb (60.8 kg)  ?Height: '5\' 2"'$  (1.575 m)  ? ?Exam ?General appearance : Awake, alert, not in any distress. Speech Clear. Not toxic looking ?HEENT: Atraumatic and Normocephalic ?Neck: Supple, no JVD. No cervical lymphadenopathy. No T-megaly ?Chest: Good air entry bilaterally, CTAB.  No rales/rhonchi/wheezing ?CVS: S1 S2 regular, no murmurs.  ?Extremities: B/L Lower Ext shows no edema, both legs are warm to touch ?Neurology: Awake alert, and oriented X 3, CN II-XII intact, Non focal ?Skin: No Rash ? ?Data Review ?No results found for: HGBA1C ? ?Assessment & Plan  ? ?1. Poor appetite ?- Thyroid Panel With TSH ? ?2. Cervical radiculopathy ?- Ambulatory  referral to Orthopedic Surgery ? ?3. Hypercalcemia ?- Comprehensive metabolic panel ? ?4. S/P parathyroidectomy ([place]) ?- Thyroid Panel With TSH ?- Comprehensive metabolic panel ? ?5. Hemorrhoids, unspecified hemorrhoid type ?- Comprehensive metabolic panel ?- CBC with Differential/Platelet ?- Ambulatory referral to Gastroenterology ? ?6. Ulcer of esophagus without bleeding ?- Comprehensive metabolic panel ?- Lipase ?- CBC with Differential/Platelet ?- Ambulatory referral to Gastroenterology(pending financial and requesting different practitioner for this and radiculopathy/ortho.   ? ?Spent about 30mns face to face listening to concerns and issues and trying to find favorable solutions ? ?Return in about 3 months (around 10/07/2021) for PCP for chronic conditions. ? ?The patient was given clear instructions to go to ER or return to medical center if symptoms don't improve, worsen or new problems develop. The patient verbalized understanding. The patient was told to call to get lab results if they haven't heard anything in the next week.  ? ? ? ? ?AFreeman Caldron PA-C ?[place] [place]?[phone]  ?07/07/2021, 2:16 PM  ?

## 2021-07-08 LAB — CBC WITH DIFFERENTIAL/PLATELET
Basophils Absolute: 0.1 10*3/uL (ref 0.0–0.2)
Basos: 1 %
EOS (ABSOLUTE): 0.1 10*3/uL (ref 0.0–0.4)
Eos: 1 %
Hematocrit: 38 % (ref 34.0–46.6)
Hemoglobin: 12.1 g/dL (ref 11.1–15.9)
Immature Grans (Abs): 0 10*3/uL (ref 0.0–0.1)
Immature Granulocytes: 0 %
Lymphocytes Absolute: 2.7 10*3/uL (ref 0.7–3.1)
Lymphs: 27 %
MCH: 24.9 pg — ABNORMAL LOW (ref 26.6–33.0)
MCHC: 31.8 g/dL (ref 31.5–35.7)
MCV: 78 fL — ABNORMAL LOW (ref 79–97)
Monocytes Absolute: 0.5 10*3/uL (ref 0.1–0.9)
Monocytes: 5 %
Neutrophils Absolute: 6.5 10*3/uL (ref 1.4–7.0)
Neutrophils: 66 %
Platelets: 318 10*3/uL (ref 150–450)
RBC: 4.85 x10E6/uL (ref 3.77–5.28)
RDW: 15.7 % — ABNORMAL HIGH (ref 11.7–15.4)
WBC: 9.9 10*3/uL (ref 3.4–10.8)

## 2021-07-08 LAB — COMPREHENSIVE METABOLIC PANEL
ALT: 10 IU/L (ref 0–32)
AST: 15 IU/L (ref 0–40)
Albumin/Globulin Ratio: 1.7 (ref 1.2–2.2)
Albumin: 4.4 g/dL (ref 3.8–4.8)
Alkaline Phosphatase: 79 IU/L (ref 44–121)
BUN/Creatinine Ratio: 14 (ref 9–23)
BUN: 10 mg/dL (ref 6–24)
Bilirubin Total: 0.4 mg/dL (ref 0.0–1.2)
CO2: 20 mmol/L (ref 20–29)
Calcium: 9.1 mg/dL (ref 8.7–10.2)
Chloride: 106 mmol/L (ref 96–106)
Creatinine, Ser: 0.72 mg/dL (ref 0.57–1.00)
Globulin, Total: 2.6 g/dL (ref 1.5–4.5)
Glucose: 90 mg/dL (ref 70–99)
Potassium: 4.2 mmol/L (ref 3.5–5.2)
Sodium: 140 mmol/L (ref 134–144)
Total Protein: 7 g/dL (ref 6.0–8.5)
eGFR: 108 mL/min/{1.73_m2} (ref >59)

## 2021-07-08 LAB — LIPASE: Lipase: 20 U/L (ref 14–72)

## 2021-07-08 LAB — THYROID PANEL WITH TSH
Free Thyroxine Index: 1.4 (ref 1.2–4.9)
T3 Uptake Ratio: 23 % — ABNORMAL LOW (ref 24–39)
T4, Total: 6.2 ug/dL (ref 4.5–12.0)
TSH: 1.43 u[IU]/mL (ref 0.450–4.500)

## 2021-07-12 ENCOUNTER — Encounter: Payer: Self-pay | Admitting: Physical Therapy

## 2021-07-12 ENCOUNTER — Ambulatory Visit (INDEPENDENT_AMBULATORY_CARE_PROVIDER_SITE_OTHER): Payer: Self-pay | Admitting: Physical Therapy

## 2021-07-12 DIAGNOSIS — M5459 Other low back pain: Secondary | ICD-10-CM

## 2021-07-12 NOTE — Therapy (Signed)
?OUTPATIENT PHYSICAL THERAPY THORACOLUMBAR EVALUATION ? ? ?Patient Name: Katie Benson ?MRN: [id number] ?DOB:15-Jun-1980, 41 y.o., female ?Today's Date: 07/12/2021 ? ? PT End of Session - 07/12/21 1438   ? ? Visit Number 1   ? Number of Visits 1   ? PT Start Time 1347   ? PT Stop Time 1430   ? PT Time Calculation (min) 43 min   ? Activity Tolerance Patient tolerated treatment well   ? Behavior During Therapy [facility] for tasks assessed/performed   ? ?  ?  ? ?  ? ? ?Past Medical History:  ?Diagnosis Date  ? Anemia   ? Arthritis   ? Fibroids   ? GERD (gastroesophageal reflux disease)   ? History of kidney stones   ? Neck injury   ? ?Past Surgical History:  ?Procedure Laterality Date  ? CESAREAN SECTION    ? IR ANGIOGRAM PELVIS SELECTIVE OR SUPRASELECTIVE  03/18/2018  ? IR ANGIOGRAM PELVIS SELECTIVE OR SUPRASELECTIVE  03/18/2018  ? IR ANGIOGRAM SELECTIVE EACH ADDITIONAL VESSEL  03/18/2018  ? IR ANGIOGRAM SELECTIVE EACH ADDITIONAL VESSEL  03/18/2018  ? IR EMBO TUMOR ORGAN ISCHEMIA INFARCT INC GUIDE ROADMAPPING  03/18/2018  ? IR RADIOLOGIST EVAL & MGMT  10/17/2017  ? IR RADIOLOGIST EVAL & MGMT  12/20/2017  ? IR RADIOLOGIST EVAL & MGMT  04/17/2018  ? IR US GUIDE VASC ACCESS RIGHT  03/18/2018  ? KNEE ARTHROSCOPY WITH LATERAL MENISECTOMY Left 12/24/2018  ? Procedure: LEFT KNEE ARTHROSCOPY WITH LATERAL MENISECTOM, debriment of GANGLION CYST;  Surgeon: [name], MD;  Location: [facility];  Service: Orthopedics;  Laterality: Left;  ? PARATHYROIDECTOMY N/A 02/12/2018  ? Procedure: PARATHYROIDECTOMY;  Surgeon: Fredirick Maudlin, MD;  Location: [facility] OR[facility];  Service: General;  Laterality: N/A;  ? WISDOM TOOTH EXTRACTION    ? age 61  ? ?Patient Active Problem List  ? Diagnosis Date Noted  ? Osteoarthritis of left knee 12/14/2020  ? Subacromial bursitis of right shoulder joint 07/15/2020  ? Cervical radiculopathy 06/24/2020  ? Effusion, left knee 06/24/2019  ? Pes anserine bursitis 04/29/2019  ? Bucket handle tear of lateral meniscus of  left knee 12/12/2018  ? Ganglion cyst 12/12/2018  ? Fibroids 03/18/2018  ? S/P parathyroidectomy ([place]) 02/12/2018  ? Primary hyperparathyroidism ([place]) 01/08/2018  ? Hyperparathyroidism , secondary, non-renal ([place]) 01/08/2018  ? Fibroid uterus 12/06/2017  ? Hypercalcemia 12/06/2017  ? Fibroid 10/15/2017  ? Abnormal uterine bleeding (AUB) 08/20/2017  ? Anemia 08/20/2017  ? ? ?PCP: Ladell Pier, MD ? ?REFERRING PROVIDER: Donella Stade, PA-C ? ?REFERRING DIAG: M54.50,G89.29 (ICD-10-CM) - Chronic low back pain, unspecified back pain laterality, unspecified whether sciatica present ? ?THERAPY DIAG:  ?Other low back pain ? ?ONSET DATE: 6 months onset of pain ? ?SUBJECTIVE:                                                                                                                                                                                          ? ?  SUBJECTIVE STATEMENT: ?She has been dealing with Rt sided low back pain for last 6-7 months without know cause or MOI. She does feel she does not stand up straight and this has been since her knee surgery as she cant fully extend her knee. She has she has 4 ulcers and has not been able to each much. She says she also has pinched nerve in her neck with numbness in her hands. ? ?PERTINENT HISTORY:  ? ? ?PAIN:  ?Are you having pain? Yes: NPRS scale: 15/10 ?Pain location: Rt lumbar and into her Rt hip ?Pain description: dull throbbing constant pain ?Aggravating factors: standing, sitting ?Relieving factors: laying on her back with legs off to the left of bed  ? ? ?PRECAUTIONS: None ? ?WEIGHT BEARING RESTRICTIONS No ? ?FALLS:  ?Has patient fallen in last 6 months?  ? ?OCCUPATION: not working currently ? ?PLOF: Independent ? ?PATIENT GOALS get rid of pain ? ? ?OBJECTIVE:  ? ?DIAGNOSTIC FINDINGS:  ?AP and lateral views of lumbar spine reviewed.  No spondylolisthesis,  ?scoliosis, fracture noted.  No significant degenerative changes noted. ? ?PATIENT SURVEYS:  ?FOTO  45% functional ? ?SCREENING FOR RED FLAGS: ?Bowel or bladder incontinence: No ? ?COGNITION: ? Overall cognitive status: Within functional limits for tasks assessed   ?  ?SENSATION: ?WFL ? ?MUSCLE LENGTH: ?Hamstrings: Right 75 deg, Left 50 deg and limited by pain ? ? ?POSTURE:  ?Rt lateral trunk shift ? ?PALPATION: ? ? ?LUMBAR ROM:  ? ?AROM  A/PROM  ?07/12/2021  ?Flexion WNL  ?Extension 25% and painful  ?Right lateral flexion 25% and painful  ?Left lateral flexion WNL  ?Right rotation WNL  ?Left rotation WNL  ? (Blank rows = not tested) ? ?LE ROM: ? ?AROM Right ?07/12/2021 Left ?07/12/2021  ?Hip flexion    ?Hip extension    ?Hip abduction    ?Hip adduction    ?Hip internal rotation    ?Hip external rotation    ?Knee flexion    ?Knee extension  Missing 5 deg   ?Ankle dorsiflexion    ?Ankle plantarflexion    ?Ankle inversion    ?Ankle eversion    ? (Blank rows = not tested) ? ?LE MMT: ? ?MMT Right ?07/12/2021 Left ?07/12/2021  ?Hip flexion 4 4  ?Hip extension    ?Hip abduction 4 4  ?Hip adduction    ?Hip internal rotation    ?Hip external rotation    ?Knee flexion 4+ 4  ?Knee extension 4+ 4  ?Ankle dorsiflexion    ?Ankle plantarflexion    ?Ankle inversion    ?Ankle eversion    ? (Blank rows = not tested) ? ?LUMBAR SPECIAL TESTS:  ?Straight leg raise test: Positive crossed SLR, negative same side SLR ? ?FUNCTIONAL TESTS:  ? ? ?GAIT: ? ?Comments: mild antaglic gait limited by pain ? ? ? ?TODAY'S TREATMENT  ?HEP review listed below ?Lumbar rotational manipulation to Rt side in left sidelying with + cavitation noted ?Moist heat X 10 min to lumbar ? ? ?PATIENT EDUCATION:  ?Education details: HEP ?Person educated: Patient ?Education method: Explanation, Demonstration, Verbal cues, and Handouts ?Education comprehension: verbalized understanding ? ? ?HOME EXERCISE PROGRAM: ?Access Code: [email or web address] ?URL: [email or web address] ?Date: 07/12/2021 ?Prepared by: Elsie Ra ? ?Exercises ?- Right Standing Lateral Shift  Correction at [place]  - 2 x daily - 6 x weekly - 1-2 sets - 10 reps - 5 sec hold ?- Standing Lumbar Extension at [place]  - 2 x daily - 6  x weekly - 1-2 sets - 10 reps ?- Sidelying Thoracic Rotation  - 2 x daily - 6 x weekly - 1 sets - 10 reps - 5 sec hold ?- Supine ITB Stretch with Strap  - 2 x daily - 6 x weekly - 1 sets - 3 reps - 30 hold ?- Supine Piriformis Stretch with Foot on Ground  - 2 x daily - 6 x weekly - 1 sets - 3 reps - 30 sec hold ?- Supine Bridge  - 2 x daily - 6 x weekly - 1-2 sets - 10 reps - 5 hold ? ? ?ASSESSMENT: ? ?CLINICAL IMPRESSION: Patient presents with signs and symptoms consistent with Disc pathology and chronic Rt sided lumbar/SIJ pain. Per MD referral this is a one time visit for HEP and she states she is applying for financial assistance and won't be able to attend PT until she gets assistance financially.  ? ?OBJECTIVE IMPAIRMENTS: decreased activity tolerance, difficulty walking, decreased balance, decreased endurance, decreased mobility, decreased ROM, decreased strength, impaired flexibility, impaired UE/LE use, postural dysfunction, and pain. ? ?ACTIVITY LIMITATIONS: bending, lifting, carry, locomotion, cleaning, community activity, driving, and pain ? ?PERSONAL FACTORS: PMH:Lt  knee OA and previous knee scope with left lateral meniscetomy ? ?REHAB POTENTIAL: Fair   ? ?CLINICAL DECISION MAKING: Stable/uncomplicated ? ?EVALUATION COMPLEXITY: Low ? ? ? ?GOALS: ?No goals as this is one time visit. ? ? ? ?PLAN FOR NEXT SESSION: one time visit for now for HEP ? ? ? ?Debbe Odea, PT,DPT ?07/12/2021, 2:39 PM ? ?

## 2021-08-05 ENCOUNTER — Encounter: Payer: Self-pay | Admitting: Surgical

## 2021-08-05 ENCOUNTER — Ambulatory Visit (INDEPENDENT_AMBULATORY_CARE_PROVIDER_SITE_OTHER): Payer: Self-pay | Admitting: Surgical

## 2021-08-05 ENCOUNTER — Other Ambulatory Visit: Payer: Self-pay

## 2021-08-05 DIAGNOSIS — G8929 Other chronic pain: Secondary | ICD-10-CM

## 2021-08-05 DIAGNOSIS — M545 Low back pain, unspecified: Secondary | ICD-10-CM

## 2021-08-05 MED ORDER — CYCLOBENZAPRINE HCL 10 MG PO TABS
10.0000 mg | ORAL_TABLET | Freq: Two times a day (BID) | ORAL | 0 refills | Status: DC
  Filled 2021-08-05: qty 30, 15d supply, fill #0

## 2021-08-05 NOTE — Progress Notes (Signed)
Office Visit Note   Patient: Katie Benson           Date of Birth: Jul 20, 1980           MRN: [id number] Visit Date: 08/05/2021 Requested by: Ladell Pier, MD [place],  [place] [id number] PCP: Ladell Pier, MD  Subjective: Chief Complaint  Patient presents with   Lower Back - Pain    HPI: Katie Benson is a 41 y.o. female who presents to the office complaining of continued low back pain.  She notes axial and left/right sided lumbar spine pain.  She was seen at her last appointment and has been doing exercises at home designed by physical therapy without any relief of her symptoms.  States that Robaxin has been helping her sleep but she has run out of this.  No bowel or bladder incontinence or saddle anesthesia.  She also complains of numbness and tingling in her fingers, primarily in the right index finger as well as the right middle and right thumb.  She has tingling and pain sensation that travels down her entire right arm into the right hand in these fingers.  No history of carpal tunnel syndrome.  She does have history of cervical spondylosis with moderate right-sided foraminal stenosis at C4-C5, C5-C6.  She has had prior right-sided ESI in the paramedian epidural space at C7-T1 by Dr. Ernestina Patches on 08/23/2020 that gave her good relief.  In addition to these complaints, patient also reports that she has noticed a knot on her left shoulder blade that people have mentioned it to her is more noticeable.  She states that no one has mentioned this to them before in the past up until the last 6 weeks.  She denies any pain in this location..                ROS: All systems reviewed are negative as they relate to the chief complaint within the history of present illness.  Patient denies fevers or chills.  Assessment & Plan: Visit Diagnoses:  1. Chronic low back pain, unspecified back pain laterality, unspecified whether sciatica present     Plan:  Patient is a 41 year old female who presents for evaluation of continued low back pain with right-sided radicular pain and numbness/tingling in the right hand.  With continued low back pain, plan to order MRI of the lumbar spine for further evaluation.  She has failed conservative management with no improvement with home exercise program.  Also with her right-sided radicular pain and previous improvement with right-sided ESI by Dr. Ernestina Patches in June 2022, plan to refer her back to Dr. Ernestina Patches for repeat injection.  Exam is negative for any findings of carpal tunnel syndrome on exam today but if she does not have much relief from the neck injection, could consider nerve conduction study to evaluate for carpal tunnel syndrome given her median nerve distribution of symptoms.  She also has a mass in the left scapular region that she has noticed more recently after people pointed out to her.  It measures 5 x 6 cm on exam today.  Ultrasound examination showed a ovular isoechoic mass that was pliable with transducer pressure and nontender throughout the examination.  Color Doppler showed no concerning findings throughout the entirety of the mass.  Plan to recheck this on future exam and if there is any enlarging of the mass then would recommend further evaluation with MRI scan but for now, there are no  concerning ultrasound findings for malignancy.  Patient agreed with this plan.  Follow-up after MRI.  Follow-Up Instructions: No follow-ups on file.   Orders:  Orders Placed This Encounter  Procedures   MR Lumbar Spine w/o contrast   Ambulatory referral to Physical Medicine Rehab   No orders of the defined types were placed in this encounter.     Procedures: No procedures performed   Clinical Data: No additional findings.  Objective: Vital Signs: There were no vitals taken for this visit.  Physical Exam:  Constitutional: Patient appears well-developed HEENT:  Head: Normocephalic Eyes:EOM are  normal Neck: Normal range of motion Cardiovascular: Normal rate Pulmonary/chest: Effort normal Neurologic: Patient is alert Skin: Skin is warm Psychiatric: Patient has normal mood and affect  Ortho Exam: Ortho exam demonstrates negative Spurling sign and negative Lhermitte sign.  Negative Tinel sign over the right carpal tunnel with negative Durkan and negative Phalen sign.  She has 5 -/5 grip strength, finger abduction, pronation, bicep flexion, tricep extension, deltoid of the right upper extremity with 4/5 supination strength noted.  Tender throughout the axial lumbar spine and paraspinal musculature.  Also noted is a 5 cm x 6 cm mass in the medial aspect of the left scapula.  This mass is nontender and fairly mobile.  It is pliable.  Specialty Comments:  No specialty comments available.  Imaging: No results found.   PMFS History: Patient Active Problem List   Diagnosis Date Noted   Osteoarthritis of left knee 12/14/2020   Subacromial bursitis of right shoulder joint 07/15/2020   Cervical radiculopathy 06/24/2020   Effusion, left knee 06/24/2019   Pes anserine bursitis 04/29/2019   Bucket handle tear of lateral meniscus of left knee 12/12/2018   Ganglion cyst 12/12/2018   Fibroids 03/18/2018   S/P parathyroidectomy ([place]) 02/12/2018   Primary hyperparathyroidism ([place]) 01/08/2018   Hyperparathyroidism , secondary, non-renal ([place]) 01/08/2018   Fibroid uterus 12/06/2017   Hypercalcemia 12/06/2017   Fibroid 10/15/2017   Abnormal uterine bleeding (AUB) 08/20/2017   Anemia 08/20/2017   Past Medical History:  Diagnosis Date   Anemia    Arthritis    Fibroids    GERD (gastroesophageal reflux disease)    History of kidney stones    Neck injury     Family History  Problem Relation Age of Onset   Diabetes Sister    Cancer Maternal Grandmother        ovarian   Hypercalcemia Mother    Other Father        HX Unknown    Past Surgical History:  Procedure Laterality Date    CESAREAN SECTION     IR ANGIOGRAM PELVIS SELECTIVE OR SUPRASELECTIVE  03/18/2018   IR ANGIOGRAM PELVIS SELECTIVE OR SUPRASELECTIVE  03/18/2018   IR ANGIOGRAM SELECTIVE EACH ADDITIONAL VESSEL  03/18/2018   IR ANGIOGRAM SELECTIVE EACH ADDITIONAL VESSEL  03/18/2018   IR EMBO TUMOR ORGAN ISCHEMIA INFARCT INC GUIDE ROADMAPPING  03/18/2018   IR RADIOLOGIST EVAL & MGMT  10/17/2017   IR RADIOLOGIST EVAL & MGMT  12/20/2017   IR RADIOLOGIST EVAL & MGMT  04/17/2018   IR US GUIDE VASC ACCESS RIGHT  03/18/2018   KNEE ARTHROSCOPY WITH LATERAL MENISECTOMY Left 12/24/2018   Procedure: LEFT KNEE ARTHROSCOPY WITH LATERAL MENISECTOM, debriment of GANGLION CYST;  Surgeon: [name], MD;  Location: [facility];  Service: Orthopedics;  Laterality: Left;   PARATHYROIDECTOMY N/A 02/12/2018   Procedure: PARATHYROIDECTOMY;  Surgeon: Fredirick Maudlin, MD;  Location: [facility];  Service: General;  Laterality: N/A;   WISDOM TOOTH EXTRACTION     age 83   Social History   Occupational History   Occupation: unemployed  Tobacco Use   Smoking status: Some Days    Packs/day: 0.25    Years: 14.00    Total pack years: 3.50    Types: Cigarettes   Smokeless tobacco: Never  Vaping Use   Vaping Use: Never used  Substance and Sexual Activity   Alcohol use: Yes    Comment: occassionally on weekends   Drug use: Yes    Types: Marijuana    Comment: multiple times a day   Sexual activity: Yes    Partners: Male    Birth control/protection: Condom

## 2021-08-08 ENCOUNTER — Other Ambulatory Visit: Payer: Self-pay

## 2021-08-15 ENCOUNTER — Other Ambulatory Visit: Payer: Self-pay

## 2021-08-19 ENCOUNTER — Ambulatory Visit
Admission: RE | Admit: 2021-08-19 | Discharge: 2021-08-19 | Disposition: A | Discharge status: Home / Self Care | Payer: No Typology Code available for payment source | Source: Ambulatory Visit | Attending: Surgical | Admitting: Surgical

## 2021-08-19 DIAGNOSIS — G8929 Other chronic pain: Secondary | ICD-10-CM

## 2021-08-25 ENCOUNTER — Ambulatory Visit: Payer: Self-pay | Admitting: Gastroenterology

## 2021-08-25 ENCOUNTER — Other Ambulatory Visit: Payer: Self-pay | Admitting: Surgical

## 2021-08-25 ENCOUNTER — Other Ambulatory Visit: Payer: Self-pay

## 2021-08-25 ENCOUNTER — Encounter: Payer: Self-pay | Admitting: Gastroenterology

## 2021-08-25 ENCOUNTER — Ambulatory Visit (INDEPENDENT_AMBULATORY_CARE_PROVIDER_SITE_OTHER): Payer: Medicaid Other | Admitting: Gastroenterology

## 2021-08-25 VITALS — BP 117/79 | HR 73 | Ht 62.0 in | Wt 134.0 lb

## 2021-08-25 DIAGNOSIS — R1013 Epigastric pain: Secondary | ICD-10-CM

## 2021-08-25 DIAGNOSIS — R140 Abdominal distension (gaseous): Secondary | ICD-10-CM

## 2021-08-25 DIAGNOSIS — K625 Hemorrhage of anus and rectum: Secondary | ICD-10-CM

## 2021-08-25 MED ORDER — DESIPRAMINE HCL 25 MG PO TABS
25.0000 mg | ORAL_TABLET | Freq: Every day | ORAL | 5 refills | Status: DC
  Filled 2021-08-25: qty 30, 30d supply, fill #0

## 2021-08-25 MED ORDER — HYOSCYAMINE SULFATE ER 0.375 MG PO TB12
0.3750 mg | ORAL_TABLET | Freq: Two times a day (BID) | ORAL | 5 refills | Status: DC
  Filled 2021-08-25: qty 60, 30d supply, fill #0

## 2021-08-25 MED ORDER — LIDOCAINE (ANORECTAL) 5 % EX CREA
1.0000 | TOPICAL_CREAM | CUTANEOUS | 1 refills | Status: AC | PRN
  Filled 2021-08-25: qty 28, fill #0

## 2021-08-25 NOTE — Progress Notes (Signed)
Gastroenterology Consultation  Referring Provider:     Argentina Donovan, PA-C Primary Care Physician:  Ladell Pier, MD Primary Gastroenterologist:  Dr. Allen Norris     Reason for Consultation:     Multiple GI issues        HPI:   Katie Benson is a 41 y.o. y/o female referred for consultation & management of Multiple GI issues by Dr. Wynetta Emery, Dalbert Batman, MD. This patient comes in today to see me after being seen in [facility] by Dr. Silverio Decamp.  The patient had seen her last year in the office and for an EGD and colonoscopy.  The procedures had shown:  Colonoscopy Jun 28, 2020 - Diverticulosis in the sigmoid colon. - A few erosions in the rectum. Biopsied. - External and internal hemorrhoids. - One 5 mm polyp in the rectum, removed with a cold snare. Resected and retrieved.   EGD Jun 28, 2020 - LA Grade C reflux esophagitis with no bleeding. Biopsied. - Esophageal ulcers. - Normal stomach. - Normal examined duodenum.   1. Surgical [P], esophagus esophagitis and ulcers - GASTROESOPHAGEAL JUNCTION MUCOSA WITH REACTIVE/REGENERATIVE CHANGE. - NEGATIVE FOR INTESTINAL METAPLASIA (GOBLET CELL METAPLASIA). 2. Surgical [P], colon, rectum erosions - COLONIC MUCOSA WITH NO SIGNIFICANT PATHOLOGIC FINDINGS. - NEGATIVE FOR ACTIVE INFLAMMATION AND OTHER ABNORMALITIES. 3. Surgical [P], colon, rectum, polyp (1) - HYPERPLASTIC POLYP.  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  The patient had a CT scan without any pathology seen. The patient reports that she felt like she was not getting answers to her questions from her last GI doctor.  The patient states that she has had abdominal discomfort that she reports to be very severe causing her to have a significant amount of weight loss.  The patient reports that she really 1 day and then have a lot of pain the next  day.  She states that she was given a diet that had a lot of fruits and she was admitted and she was felt bad the next day.  The patient took dicyclomine 3 times a day and states that her pain was even worse with dicyclomine.  She reports that her symptoms are better after she moves her bowels.  She doesn't have any constipation or diarrhea but states that when she eats the next day she has to move her bowels multiple times throughout the day but states that the stools are not soft or hard.  She also reports that her symptoms are worse with stress and worse with her menstrual cycle.  The patient was also diagnosed with a anal fissure that she reports to have improved greatly but she still has some rectal bleeding.  Past Medical History:  Diagnosis Date   Anemia    Arthritis    Fibroids    GERD (gastroesophageal reflux disease)    History of kidney stones    Neck injury     Past Surgical History:  Procedure Laterality Date   CESAREAN SECTION     IR ANGIOGRAM PELVIS SELECTIVE OR SUPRASELECTIVE  03/18/2018   IR ANGIOGRAM PELVIS SELECTIVE OR SUPRASELECTIVE  03/18/2018   IR ANGIOGRAM SELECTIVE EACH ADDITIONAL VESSEL  03/18/2018   IR ANGIOGRAM SELECTIVE EACH ADDITIONAL VESSEL  03/18/2018   IR EMBO TUMOR ORGAN ISCHEMIA INFARCT INC GUIDE ROADMAPPING  03/18/2018   IR RADIOLOGIST EVAL & MGMT  10/17/2017   IR  RADIOLOGIST EVAL & MGMT  12/20/2017   IR RADIOLOGIST EVAL & MGMT  04/17/2018   IR US GUIDE VASC ACCESS RIGHT  03/18/2018   KNEE ARTHROSCOPY WITH LATERAL MENISECTOMY Left 12/24/2018   Procedure: LEFT KNEE ARTHROSCOPY WITH LATERAL MENISECTOM, debriment of GANGLION CYST;  Surgeon: [name], MD;  Location: [facility];  Service: Orthopedics;  Laterality: Left;   PARATHYROIDECTOMY N/A 02/12/2018   Procedure: PARATHYROIDECTOMY;  Surgeon: Fredirick Maudlin, MD;  Location: [facility];  Service: General;  Laterality: N/A;   WISDOM TOOTH EXTRACTION     age 36    Prior to Admission medications   Medication  Sig Start Date End Date Taking? Authorizing Provider  AMBULATORY NON FORMULARY MEDICATION Medication Name:  Nitroglycerin ointment 0.125% three times daily use pea sized amount per rectum for 6-8 weeks 12/15/20   Mauri Pole, MD  cyclobenzaprine (FLEXERIL) 10 MG tablet Take 1 tablet (10 mg total) by mouth 2 (two) times daily. 08/05/21   Magnant, Charles L, PA-C  dicyclomine (BENTYL) 20 MG tablet TAKE 1 TABLET BY MOUTH EVERY 6 HOURS AS NEEDED FOR SPASMS Patient not taking: Reported on 07/07/2021 12/15/20   Mauri Pole, MD  gabapentin (NEURONTIN) 100 MG capsule Take 1 capsule (100 mg total) by mouth 3 (three) times daily. 06/16/21   Charlott Rakes, MD  hydrocortisone (ANUSOL-HC) 2.5 % rectal cream Place 1 application. rectally 2 (two) times daily as needed for hemorrhoids or anal itching. Patient not taking: Reported on 07/07/2021 06/15/21   Mauri Pole, MD  meloxicam (MOBIC) 15 MG tablet Take 1 tablet (15 mg total) by mouth daily. Patient not taking: Reported on 07/07/2021 12/14/20   Ladell Pier, MD  pantoprazole (PROTONIX) 40 MG tablet Take 1 tablet (40 mg total) by mouth daily. Patient not taking: Reported on 07/07/2021 12/15/20   Mauri Pole, MD  polyethylene glycol (MIRALAX / GLYCOLAX) 17 g packet Take 17 g by mouth daily. Patient not taking: Reported on 07/07/2021    [provider]    Family History  Problem Relation Age of Onset   Diabetes Sister    Cancer Maternal Grandmother        ovarian   Hypercalcemia Mother    Other Father        HX Unknown     Social History   Tobacco Use   Smoking status: Some Days    Packs/day: 0.25    Years: 14.00    Total pack years: 3.50    Types: Cigarettes   Smokeless tobacco: Never  Vaping Use   Vaping Use: Never used  Substance Use Topics   Alcohol use: Yes    Comment: occassionally on weekends   Drug use: Yes    Types: Marijuana    Comment: multiple times a day    Allergies as of 08/25/2021 -  Review Complete 08/05/2021  Allergen Reaction Noted   Pork-derived products Hives and Swelling 03/08/2012    Review of Systems:    All systems reviewed and negative except where noted in HPI.   Physical Exam:  There were no vitals taken for this visit. No LMP recorded. General:   Alert,  Well-developed, well-nourished, pleasant and cooperative in NAD Head:  Normocephalic and atraumatic. Eyes:  Sclera clear, no icterus.   Conjunctiva pink. Ears:  Normal auditory acuity. Neck:  Supple; no masses or thyromegaly. Lungs:  Respirations even and unlabored.  Clear throughout to auscultation.   No wheezes, crackles, or rhonchi. No acute distress. Heart:  Regular  rate and rhythm; no murmurs, clicks, rubs, or gallops. Abdomen:  Normal bowel sounds.  No bruits.  Soft, non-tender and non-distended without masses, hepatosplenomegaly or hernias noted.  No guarding or rebound tenderness.  Negative Carnett sign.   Rectal:  Some blood on the glove without any anal fissures noted there is also no external hemorrhoids.  Pulses:  Normal pulses noted. Extremities:  No clubbing or edema.  No cyanosis. Neurologic:  Alert and oriented x3;  grossly normal neurologically. Skin:  Intact without significant lesions or rashes.  No jaundice. Lymph Nodes:  No significant cervical adenopathy. Psych:  Alert and cooperative. Normal mood and affect.  Imaging Studies: MR Lumbar Spine w/o contrast  Result Date: 08/19/2021 CLINICAL DATA:  No injury or prior surgery. EXAM: MRI LUMBAR SPINE WITHOUT CONTRAST TECHNIQUE: Multiplanar, multisequence MR imaging of the lumbar spine was performed. No intravenous contrast was administered. COMPARISON:  Lumbar spine x-rays dated June 23, 2021. CT abdomen pelvis dated March 26, 2020. FINDINGS: Segmentation:  Standard. Alignment:  Physiologic. Vertebrae:  No fracture, evidence of discitis, or bone lesion. Conus medullaris and cauda equina: Conus extends to the L1-L2 level. Conus and  cauda equina appear normal. Paraspinal and other soft tissues: Negative. Disc levels: T11-T12 to L2-L3: Negative. L3-L4: Mild disc bulging and bilateral facet arthropathy. Mild bilateral neuroforaminal stenosis. No spinal canal stenosis. L4-L5: Negative disc. Mild bilateral facet arthropathy. Mild bilateral neuroforaminal stenosis. No spinal canal stenosis. L5-S1: Negative disc. Mild bilateral facet arthropathy. No stenosis. IMPRESSION: 1. Mild degenerative changes of the lower lumbar spine as described above. No high-grade stenosis or impingement. Electronically Signed   By: Titus Dubin M.D.   On: 08/19/2021 16:36    Assessment and Plan:   Judyann Casasola is a 41 y.o. y/o female who comes in with multiple GI issues including bloating abdominal discomfort postprandial pain that lasts for over a day after she eats. The patient's symptoms are consistent with a functional bowel disorder and she has been told that a low FODMAP diet can be beneficial.  The patient will also be started on desipramine 25 mg before she goes to bed and will also be given Levbid twice a day for her irritable bowel syndrome since her dicyclomine caused her worsening of her symptoms.  The patient will contact me if her symptoms do not improve.  The patient has been explained the plan and agrees with it.    Lucilla Lame, MD. Marval Regal    Note: This dictation was prepared with Dragon dictation along with smaller phrase technology. Any transcriptional errors that result from this process are unintentional.

## 2021-08-26 ENCOUNTER — Other Ambulatory Visit: Payer: Self-pay

## 2021-08-28 MED ORDER — CYCLOBENZAPRINE HCL 10 MG PO TABS
10.0000 mg | ORAL_TABLET | Freq: Two times a day (BID) | ORAL | 0 refills | Status: DC
  Filled 2021-08-28: qty 30, 15d supply, fill #0

## 2021-08-29 ENCOUNTER — Other Ambulatory Visit: Payer: Self-pay

## 2021-08-29 MED ORDER — HYOSCYAMINE SULFATE ER 0.375 MG PO TB12
ORAL_TABLET | ORAL | 5 refills | Status: DC
  Filled 2021-08-29: qty 60, 30d supply, fill #0
  Filled 2021-09-27: qty 60, 30d supply, fill #1
  Filled 2021-11-03: qty 60, 30d supply, fill #2
  Filled 2021-11-27: qty 60, 30d supply, fill #3

## 2021-08-29 MED ORDER — DESIPRAMINE HCL 25 MG PO TABS
ORAL_TABLET | ORAL | 5 refills | Status: DC
  Filled 2021-08-29: qty 30, 30d supply, fill #0
  Filled 2021-09-27: qty 30, 30d supply, fill #1
  Filled 2021-11-03: qty 30, 30d supply, fill #2
  Filled 2021-11-27: qty 10, 10d supply, fill #3
  Filled 2021-11-29: qty 20, 20d supply, fill #3
  Filled 2022-01-03 – 2022-01-13 (×2): qty 30, 30d supply, fill #4
  Filled 2022-01-26 – 2022-01-30 (×2): qty 30, 30d supply, fill #5

## 2021-08-31 ENCOUNTER — Other Ambulatory Visit: Payer: Self-pay

## 2021-08-31 NOTE — Telephone Encounter (Signed)
Can we get her an appointment to see Dr Marlou Sa this week? I don't think she has seen him before

## 2021-09-01 ENCOUNTER — Other Ambulatory Visit: Payer: Self-pay

## 2021-09-01 ENCOUNTER — Ambulatory Visit: Payer: Self-pay

## 2021-09-01 ENCOUNTER — Ambulatory Visit (INDEPENDENT_AMBULATORY_CARE_PROVIDER_SITE_OTHER): Payer: Self-pay | Admitting: Orthopedic Surgery

## 2021-09-01 ENCOUNTER — Encounter: Payer: Self-pay | Admitting: Orthopedic Surgery

## 2021-09-01 ENCOUNTER — Other Ambulatory Visit: Payer: Self-pay | Admitting: Surgical

## 2021-09-01 DIAGNOSIS — M545 Low back pain, unspecified: Secondary | ICD-10-CM

## 2021-09-01 DIAGNOSIS — G8929 Other chronic pain: Secondary | ICD-10-CM

## 2021-09-01 MED ORDER — GABAPENTIN 100 MG PO CAPS
100.0000 mg | ORAL_CAPSULE | Freq: Three times a day (TID) | ORAL | 1 refills | Status: DC
  Filled 2021-09-01 – 2021-09-27 (×2): qty 90, 30d supply, fill #0
  Filled 2021-10-22 – 2021-11-04 (×2): qty 90, 30d supply, fill #1

## 2021-09-01 MED ORDER — CYCLOBENZAPRINE HCL 10 MG PO TABS
10.0000 mg | ORAL_TABLET | Freq: Two times a day (BID) | ORAL | 0 refills | Status: DC
  Filled 2021-09-01: qty 30, 15d supply, fill #0

## 2021-09-01 MED ORDER — LIDOPURE PATCH 5 % EX KIT
5.0000 | PACK | Freq: Every day | CUTANEOUS | 1 refills | Status: DC | PRN
  Filled 2021-09-01: qty 30, fill #0

## 2021-09-01 NOTE — Progress Notes (Signed)
Office Visit Note   Patient: Katie Benson           Date of Birth: January 28, 1981           MRN: [id number] Visit Date: 09/01/2021 Requested by: Ladell Pier, MD [place],  [place] [id number] PCP: Ladell Pier, MD  Subjective: Chief Complaint  Patient presents with   Lower Back - Pain    HPI: Katie Benson is a 41 year old patient with low back pain.  Right-sided low back pain has been going on 3 days with no known injury.  Hard for her to sit and walk.  Laying down helps.  Reports pain in the buttock region.  Cannot really do physical therapy or exercises because it is too painful.  She did have an MRI scan on 623 which is reviewed with the patient today.  In general that showed 1 disc that had slightly diminished hydration otherwise no significant facet arthritis disc herniation or foraminal stenosis.  All in all fairly clean MRI scan.  Patient has cervical spine injection scheduled with Dr. Ernestina Patches on July 18.  Her back is currently hurting more than her neck              ROS: All systems reviewed are negative as they relate to the chief complaint within the history of present illness.  Patient denies  fevers or chills.   Assessment & Plan: Visit Diagnoses:  1. Chronic low back pain, unspecified back pain laterality, unspecified whether sciatica present     Plan: Impression is right-sided low back pain with fairly unimpressive MRI scan.  She may have facet arthritis mediated symptoms.  Plan to refill muscle relaxer gabapentin as well as add Lidoderm patch.  I think for her appointment on the 18th she should discuss with Dr. Ernestina Patches injection of whichever area is hurting her the most rather the cervical spine and right-sided radiculopathy in the arm or lumbar spine with right-sided facet mediated pain in the right lower back.  Follow-Up Instructions: Return if symptoms worsen or fail to improve.   Orders:  Orders Placed This Encounter  Procedures    Ambulatory referral to Physical Medicine Rehab   Meds ordered this encounter  Medications   Lidocaine-Adhesive Sheets (LIDOPURE PATCH) 5 % KIT    Sig: Apply 5 patches topically daily as needed.    Dispense:  30 kit    Refill:  1      Procedures: No procedures performed   Clinical Data: No additional findings.  Objective: Vital Signs: There were no vitals taken for this visit.  Physical Exam:   Constitutional: Patient appears well-developed HEENT:  Head: Normocephalic Eyes:EOM are normal Neck: Normal range of motion Cardiovascular: Normal rate Pulmonary/chest: Effort normal Neurologic: Patient is alert Skin: Skin is warm Psychiatric: Patient has normal mood and affect   Ortho Exam: Ortho exam demonstrates mildly painful extension but moderately painful bending.  No nerve root tension signs.  5 out of 5 ankle dorsiflexion plantarflexion quad hamstring strength with palpable pedal pulses.  No masses lymphadenopathy or skin changes noted in that back or leg region.  No paresthesias L1 S1 bilaterally.  No trochanteric tenderness is present.  Specialty Comments:  MRI CERVICAL SPINE WITHOUT CONTRAST   TECHNIQUE: Multiplanar, multisequence MR imaging of the cervical spine was performed. No intravenous contrast was administered.   COMPARISON:  CT scan 12/26/2012   FINDINGS: Alignment: No vertebral subluxation is observed.   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 Disc desiccation at all levels between C2 and C7.   Cord: No significant abnormal spinal cord signal is observed.   Posterior Fossa, vertebral arteries, paraspinal tissues: Unremarkable   Disc levels:   C2-3: Unremarkable.   C3-4: Borderline bilateral foraminal stenosis due to disc bulge and uncinate spurring.   C4-5: Moderate to prominent bilateral foraminal stenosis and borderline central narrowing of the thecal sac due to disc bulge and uncinate spurring.   C5-6: Moderate  right and moderate to prominent left foraminal stenosis and borderline central narrowing of the thecal sac due to disc bulge and uncinate spurring.   C6-7: Mild right and moderate left foraminal stenosis due to disc bulge and uncinate spurring.   C7-T1: No impingement.  Minimal uncinate spurring.   T1-2: Unremarkable.   IMPRESSION: 1. Cervical spondylosis, degenerative disc disease, and mildly congenitally short pedicles contribute to moderate to prominent impingement at C4-5; moderate impingement at C5-6; and mild impingement at C6-7, as detailed above.     Electronically Signed   By: Van Clines M.D.   On: 08/01/2020 09:25  Imaging: No results found.   PMFS History: Patient Active Problem List   Diagnosis Date Noted   Osteoarthritis of left knee 12/14/2020   Subacromial bursitis of right shoulder joint 07/15/2020   Cervical radiculopathy 06/24/2020   Effusion, left knee 06/24/2019   Pes anserine bursitis 04/29/2019   Bucket handle tear of lateral meniscus of left knee 12/12/2018   Ganglion cyst 12/12/2018   Fibroids 03/18/2018   S/P parathyroidectomy ([place]) 02/12/2018   Primary hyperparathyroidism ([place]) 01/08/2018   Hyperparathyroidism , secondary, non-renal ([place]) 01/08/2018   Fibroid uterus 12/06/2017   Hypercalcemia 12/06/2017   Fibroid 10/15/2017   Abnormal uterine bleeding (AUB) 08/20/2017   Anemia 08/20/2017   Past Medical History:  Diagnosis Date   Anemia    Arthritis    Fibroids    GERD (gastroesophageal reflux disease)    History of kidney stones    Neck injury     Family History  Problem Relation Age of Onset   Diabetes Sister    Cancer Maternal Grandmother        ovarian   Hypercalcemia Mother    Other Father        HX Unknown    Past Surgical History:  Procedure Laterality Date   CESAREAN SECTION     IR ANGIOGRAM PELVIS SELECTIVE OR SUPRASELECTIVE  03/18/2018   IR ANGIOGRAM PELVIS SELECTIVE OR SUPRASELECTIVE  03/18/2018   IR  ANGIOGRAM SELECTIVE EACH ADDITIONAL VESSEL  03/18/2018   IR ANGIOGRAM SELECTIVE EACH ADDITIONAL VESSEL  03/18/2018   IR EMBO TUMOR ORGAN ISCHEMIA INFARCT INC GUIDE ROADMAPPING  03/18/2018   IR RADIOLOGIST EVAL & MGMT  10/17/2017   IR RADIOLOGIST EVAL & MGMT  12/20/2017   IR RADIOLOGIST EVAL & MGMT  04/17/2018   IR US GUIDE VASC ACCESS RIGHT  03/18/2018   KNEE ARTHROSCOPY WITH LATERAL MENISECTOMY Left 12/24/2018   Procedure: LEFT KNEE ARTHROSCOPY WITH LATERAL MENISECTOM, debriment of GANGLION CYST;  Surgeon: [name], MD;  Location: [facility];  Service: Orthopedics;  Laterality: Left;   PARATHYROIDECTOMY N/A 02/12/2018   Procedure: PARATHYROIDECTOMY;  Surgeon: Fredirick Maudlin, MD;  Location: [facility];  Service: General;  Laterality: N/A;   WISDOM TOOTH EXTRACTION     age 40   Social History   Occupational History   Occupation: unemployed  Tobacco Use   Smoking status: Some Days    Packs/day: 0.25    Years: 14.00  Total pack years: 3.50    Types: Cigarettes   Smokeless tobacco: Never  Vaping Use   Vaping Use: Never used  Substance and Sexual Activity   Alcohol use: Yes    Comment: occassionally on weekends   Drug use: Yes    Types: Marijuana    Comment: multiple times a day   Sexual activity: Yes    Partners: Male    Birth control/protection: Condom

## 2021-09-07 ENCOUNTER — Other Ambulatory Visit: Payer: Self-pay

## 2021-09-07 ENCOUNTER — Encounter: Payer: Self-pay | Admitting: Physical Medicine and Rehabilitation

## 2021-09-07 ENCOUNTER — Ambulatory Visit (INDEPENDENT_AMBULATORY_CARE_PROVIDER_SITE_OTHER): Payer: Self-pay | Admitting: Physical Medicine and Rehabilitation

## 2021-09-07 DIAGNOSIS — M545 Low back pain, unspecified: Secondary | ICD-10-CM

## 2021-09-07 DIAGNOSIS — M5412 Radiculopathy, cervical region: Secondary | ICD-10-CM

## 2021-09-07 DIAGNOSIS — G8929 Other chronic pain: Secondary | ICD-10-CM

## 2021-09-07 DIAGNOSIS — M546 Pain in thoracic spine: Secondary | ICD-10-CM

## 2021-09-07 DIAGNOSIS — M7918 Myalgia, other site: Secondary | ICD-10-CM

## 2021-09-07 MED ORDER — METHOCARBAMOL 500 MG PO TABS
500.0000 mg | ORAL_TABLET | Freq: Three times a day (TID) | ORAL | 0 refills | Status: DC
  Filled 2021-09-07: qty 90, 30d supply, fill #0

## 2021-09-07 NOTE — Patient Instructions (Signed)

## 2021-09-07 NOTE — Progress Notes (Unsigned)
Katie Benson - 41 y.o. female MRN [id number]  Date of birth: 1980-04-27  Office Visit Note: Visit Date: 09/07/2021 PCP: Ladell Pier, MD Referred by: Ladell Pier, MD  Subjective: Chief Complaint  Patient presents with   Lower Back - Pain   HPI: Katie Benson is a 41 y.o. female who comes in today for evaluation of acute on chronic right sided lower back pain radiating to buttock and up to right thoracic back. Patient reports right sided back issues for many years which she contributes to issues with left knee and poor posture. Her pain increased significantly approximately 1 week ago after she stood up from couch. Patient states her pain is exacerbated by movement and activity, describes her pain as sore, aching and tight, currently rates as 7 out of 10. Patient reports some relief of pain with home exercise regimen, rest and use of medications. Patient states she is currently taking Gabapentin and using Flexeril at bedtime. Patient did attend 1 session of formal physical therapy with our in house team back in May, she has not returned since. Patient states there was an issues with her financial assistance but hopes to have this worked out soon. Patients recent lumbar MRI imaging exhibits mild degenerative changes of the lumbar spine, no spinal canal stenosis, no nerve impingement. Patient states her pain is negatively impacting her daily life. Patient has history of chronic left knee issues, she does have advanced osteoarthritis to left knee, previously had left knee arthroscopy with lateral menisectomy in 2020 by Dr. Joni Fears. We have previously seen patient in our office for chronic right sided neck pain with radicular symptoms, she is scheduled for cervical epidural steroid injection with [place] on 09/13/2021. Patient denies focal weakness, numbness and tingling. Patient denies recent trauma or falls.    Review of Systems  Musculoskeletal:  Positive for back pain  and myalgias.  Neurological:  Negative for tingling, sensory change, focal weakness and weakness.  All other systems reviewed and are negative.  Otherwise per HPI.  Assessment & Plan: Visit Diagnoses:    ICD-10-CM   1. Chronic right-sided low back pain without sciatica  M54.50 Ambulatory referral to Physical Therapy   G89.29     2. Acute right-sided thoracic back pain  M54.6 Ambulatory referral to Physical Therapy    3. Myofascial pain syndrome  M79.18 Ambulatory referral to Physical Therapy    4. Cervical radiculopathy  M54.12        Plan: Findings:  Acute on chronic right sided lower back pain radiating to buttock and up to right thoracic back. Patient continues to have severe pain despite good conservative therapies such as home exercise regimen, rest and use of medications. Patients clinical presentation and exam are consistent with myofascial pain syndrome. She does have multiple trigger points noted to right lumbar and thoracic paraspinal regions, she is extremely tender to touch upon exam today. I do not feel patient would benefit from lumbar injection at this time, I did feel her pain is more musculoskeletal in nature. We believe the next step is to have patient re-group with our in house physical therapy team, I do think she could benefit from manual treatments and dry needling. I also discussed medication management with her today and added Robaxin to her regimen, I feel this would be beneficial in relieving her pain during the daytime without causing drowsiness. I did provide patient with educational information regarding Dr. Tressie Ellis McGill's "Big 3" back exercises and encouraged her  to stay active as tolerated. We will follow up with patient for lower back issues as needed and will see her next week for cervical epidural steroid injection. No red flag symptoms noted.    Dr. Ernestina Patches participated with direct patient care including clinical review, exam when needed and significant portion  of diagnostic and treatment plan.  I participated today with the patient's evaluation and treatment plan along with Barnet Pall, FNP   Meds & Orders:  Meds ordered this encounter  Medications   methocarbamol (ROBAXIN) 500 MG tablet    Sig: Take 1 tablet (500 mg total) by mouth 3 (three) times daily.    Dispense:  90 tablet    Refill:  0    Orders Placed This Encounter  Procedures   Ambulatory referral to Physical Therapy    Follow-up: Return if symptoms worsen or fail to improve.   Procedures: No procedures performed      Clinical History: MRI CERVICAL SPINE WITHOUT CONTRAST   TECHNIQUE: Multiplanar, multisequence MR imaging of the cervical spine was performed. No intravenous contrast was administered.   COMPARISON:  CT scan 12/26/2012   FINDINGS: Alignment: No vertebral subluxation is observed.   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 Disc desiccation at all levels between C2 and C7.   Cord: No significant abnormal spinal cord signal is observed.   Posterior Fossa, vertebral arteries, paraspinal tissues: Unremarkable   Disc levels:   C2-3: Unremarkable.   C3-4: Borderline bilateral foraminal stenosis due to disc bulge and uncinate spurring.   C4-5: Moderate to prominent bilateral foraminal stenosis and borderline central narrowing of the thecal sac due to disc bulge and uncinate spurring.   C5-6: Moderate right and moderate to prominent left foraminal stenosis and borderline central narrowing of the thecal sac due to disc bulge and uncinate spurring.   C6-7: Mild right and moderate left foraminal stenosis due to disc bulge and uncinate spurring.   C7-T1: No impingement.  Minimal uncinate spurring.   T1-2: Unremarkable.   IMPRESSION: 1. Cervical spondylosis, degenerative disc disease, and mildly congenitally short pedicles contribute to moderate to prominent impingement at C4-5; moderate impingement at C5-6; and  mild impingement at C6-7, as detailed above.     Electronically Signed   By: Van Clines M.D.   On: 08/01/2020 09:25  ---------------------------------------------- EXAM: MRI LUMBAR SPINE WITHOUT CONTRAST   TECHNIQUE: Multiplanar, multisequence MR imaging of the lumbar spine was performed. No intravenous contrast was administered.   COMPARISON:  Lumbar spine x-rays dated June 23, 2021. CT abdomen pelvis dated March 26, 2020.   FINDINGS: Segmentation:  Standard.   Alignment:  Physiologic.   Vertebrae:  No fracture, evidence of discitis, or bone lesion.   Conus medullaris and cauda equina: Conus extends to the L1-L2 level. Conus and cauda equina appear normal.   Paraspinal and other soft tissues: Negative.   Disc levels:   T11-T12 to L2-L3: Negative.   L3-L4: Mild disc bulging and bilateral facet arthropathy. Mild bilateral neuroforaminal stenosis. No spinal canal stenosis.   L4-L5: Negative disc. Mild bilateral facet arthropathy. Mild bilateral neuroforaminal stenosis. No spinal canal stenosis.   L5-S1: Negative disc. Mild bilateral facet arthropathy. No stenosis.   IMPRESSION: 1. Mild degenerative changes of the lower lumbar spine as described above. No high-grade stenosis or impingement.     Electronically Signed   By: Titus Dubin M.D.   On: 08/19/2021 16:36   She reports that she has been smoking cigarettes. She has a 3.50 pack-year  smoking history. She has never used smokeless tobacco. No results for input(s): "HGBA1C", "LABURIC" in the last 8760 hours.  Objective:  VS:  HT:    WT:   BMI:     BP:   HR: bpm  TEMP: ( )  RESP:  Physical Exam Vitals and nursing note reviewed.  HENT:     Head: Normocephalic and atraumatic.     Right Ear: External ear normal.     Left Ear: External ear normal.     Nose: Nose normal.     Mouth/Throat:     Mouth: Mucous membranes are moist.  Eyes:     Extraocular Movements: Extraocular movements intact.   Cardiovascular:     Rate and Rhythm: Normal rate.     Pulses: Normal pulses.  Pulmonary:     Effort: Pulmonary effort is normal.  Abdominal:     General: Abdomen is flat. There is no distension.  Musculoskeletal:        General: Tenderness present.     Cervical back: Normal range of motion.     Comments: Pt rises from seated position to standing without difficulty. Good lumbar range of motion. Strong distal strength without clonus, no pain upon palpation of greater trochanters. Multiple palpable trigger points to right lumbar and thoracic paraspinal regions. Sensation intact bilaterally. Walks independently, gait steady.   Skin:    General: Skin is warm and dry.     Capillary Refill: Capillary refill takes less than 2 seconds.  Neurological:     General: No focal deficit present.     Mental Status: She is alert and oriented to person, place, and time.  Psychiatric:        Mood and Affect: Mood normal.        Behavior: Behavior normal.     Ortho Exam  Imaging: No results found.  Past Medical/Family/Surgical/Social History: Medications & Allergies reviewed per EMR, new medications updated. Patient Active Problem List   Diagnosis Date Noted   Osteoarthritis of left knee 12/14/2020   Subacromial bursitis of right shoulder joint 07/15/2020   Cervical radiculopathy 06/24/2020   Effusion, left knee 06/24/2019   Pes anserine bursitis 04/29/2019   Bucket handle tear of lateral meniscus of left knee 12/12/2018   Ganglion cyst 12/12/2018   Fibroids 03/18/2018   S/P parathyroidectomy ([place]) 02/12/2018   Primary hyperparathyroidism ([place]) 01/08/2018   Hyperparathyroidism , secondary, non-renal ([place]) 01/08/2018   Fibroid uterus 12/06/2017   Hypercalcemia 12/06/2017   Fibroid 10/15/2017   Abnormal uterine bleeding (AUB) 08/20/2017   Anemia 08/20/2017   Past Medical History:  Diagnosis Date   Anemia    Arthritis    Fibroids    GERD (gastroesophageal reflux disease)    History of  kidney stones    Neck injury    Family History  Problem Relation Age of Onset   Diabetes Sister    Cancer Maternal Grandmother        ovarian   Hypercalcemia Mother    Other Father        HX Unknown   Past Surgical History:  Procedure Laterality Date   CESAREAN SECTION     IR ANGIOGRAM PELVIS SELECTIVE OR SUPRASELECTIVE  03/18/2018   IR ANGIOGRAM PELVIS SELECTIVE OR SUPRASELECTIVE  03/18/2018   IR ANGIOGRAM SELECTIVE EACH ADDITIONAL VESSEL  03/18/2018   IR ANGIOGRAM SELECTIVE EACH ADDITIONAL VESSEL  03/18/2018   IR EMBO TUMOR ORGAN ISCHEMIA INFARCT INC GUIDE ROADMAPPING  03/18/2018   IR RADIOLOGIST EVAL & MGMT  10/17/2017  IR RADIOLOGIST EVAL & MGMT  12/20/2017   IR RADIOLOGIST EVAL & MGMT  04/17/2018   IR US GUIDE VASC ACCESS RIGHT  03/18/2018   KNEE ARTHROSCOPY WITH LATERAL MENISECTOMY Left 12/24/2018   Procedure: LEFT KNEE ARTHROSCOPY WITH LATERAL MENISECTOM, debriment of GANGLION CYST;  Surgeon: [name], MD;  Location: [facility];  Service: Orthopedics;  Laterality: Left;   PARATHYROIDECTOMY N/A 02/12/2018   Procedure: PARATHYROIDECTOMY;  Surgeon: Fredirick Maudlin, MD;  Location: [facility];  Service: General;  Laterality: N/A;   WISDOM TOOTH EXTRACTION     age 79   Social History   Occupational History   Occupation: unemployed  Tobacco Use   Smoking status: Some Days    Packs/day: 0.25    Years: 14.00    Total pack years: 3.50    Types: Cigarettes   Smokeless tobacco: Never  Vaping Use   Vaping Use: Never used  Substance and Sexual Activity   Alcohol use: Yes    Comment: occassionally on weekends   Drug use: Yes    Types: Marijuana    Comment: multiple times a day   Sexual activity: Yes    Partners: Male    Birth control/protection: Condom

## 2021-09-07 NOTE — Progress Notes (Unsigned)
Pt state lower back pain that travels to the right side of her buttocks. Pt state the pain can travels up her back. Pt state she has swollen on her right side if her back. Pt state bending, sitting and standing makes the pain worse. Pt state she takes pain meds to help ease her pain.  Numeric Pain Rating Scale and Functional Assessment Average Pain 10 Pain Right Now 8 My pain is constant, sharp, burning, stabbing, tingling, and aching Pain is worse with: walking, bending, sitting, standing, and some activites Pain improves with: rest, medication, and injections   In the last MONTH (on 0-10 scale) has pain interfered with the following?  1. General activity like being  able to carry out your everyday physical activities such as walking, climbing stairs, carrying groceries, or moving a chair?  Rating(5)  2. Relation with others like being able to carry out your usual social activities and roles such as  activities at home, at work and in your community. Rating(6)  3. Enjoyment of life such that you have  been bothered by emotional problems such as feeling anxious, depressed or irritable?  Rating(7)

## 2021-09-13 ENCOUNTER — Ambulatory Visit: Payer: Self-pay

## 2021-09-13 ENCOUNTER — Ambulatory Visit (INDEPENDENT_AMBULATORY_CARE_PROVIDER_SITE_OTHER): Payer: Self-pay | Admitting: Physical Medicine and Rehabilitation

## 2021-09-13 ENCOUNTER — Other Ambulatory Visit: Payer: Self-pay

## 2021-09-13 ENCOUNTER — Encounter: Payer: Self-pay | Admitting: Physical Medicine and Rehabilitation

## 2021-09-13 VITALS — BP 114/77 | HR 98

## 2021-09-13 DIAGNOSIS — M5412 Radiculopathy, cervical region: Secondary | ICD-10-CM

## 2021-09-13 MED ORDER — METHYLPREDNISOLONE ACETATE 80 MG/ML IJ SUSP
80.0000 mg | Freq: Once | INTRAMUSCULAR | Status: AC
  Administered 2021-09-13: 80 mg

## 2021-09-13 NOTE — Patient Instructions (Signed)

## 2021-09-13 NOTE — Progress Notes (Signed)
Pt state neck pain that travels down her right arm. Pt state any movement makes the pain worse. Pt state she takes over the counter pain meds ot help ease her pain.  Numeric Pain Rating Scale and Functional Assessment Average Pain 7   In the last MONTH (on 0-10 scale) has pain interfered with the following?  1. General activity like being  able to carry out your everyday physical activities such as walking, climbing stairs, carrying groceries, or moving a chair?  Rating(10)   +Driver, -BT, -Dye Allergies.

## 2021-09-19 ENCOUNTER — Other Ambulatory Visit: Payer: Self-pay

## 2021-09-19 ENCOUNTER — Other Ambulatory Visit: Payer: Self-pay | Admitting: Surgical

## 2021-09-19 MED ORDER — CYCLOBENZAPRINE HCL 10 MG PO TABS
10.0000 mg | ORAL_TABLET | Freq: Two times a day (BID) | ORAL | 2 refills | Status: DC
  Filled 2021-09-19 – 2021-09-27 (×2): qty 30, 15d supply, fill #0
  Filled 2021-10-07: qty 30, 15d supply, fill #1
  Filled 2021-10-22 – 2021-11-04 (×2): qty 30, 15d supply, fill #2

## 2021-09-19 NOTE — Telephone Encounter (Signed)
Sent in a refill for Flexeril with message to pharmacy that it is okay to refill early.  Sent this morning and with several refills for her.

## 2021-09-23 ENCOUNTER — Other Ambulatory Visit: Payer: Self-pay

## 2021-09-26 NOTE — Procedures (Signed)
Cervical Epidural Steroid Injection - Interlaminar Approach with Fluoroscopic Guidance  Patient: Katie Benson      Date of Birth: 1980/03/18 MRN: [id number] PCP: Ladell Pier, MD      Visit Date: 09/13/2021   Universal Protocol:    Date/Time: 07/31/236:41 AM  Consent Given By: the patient  Position: PRONE  Additional Comments: Vital signs were monitored before and after the procedure. Patient was prepped and draped in the usual sterile fashion. The correct patient, procedure, and site was verified.   Injection Procedure Details:   Procedure diagnoses: Cervical radiculopathy [M54.12]    Meds Administered:  Meds ordered this encounter  Medications   methylPREDNISolone acetate (DEPO-MEDROL) injection 80 mg     Laterality: Right  Location/Site: C7-T1  Needle: 3.5 in., 20 ga. Tuohy  Needle Placement: Paramedian epidural space  Findings:  -Comments: Excellent flow of contrast into the epidural space.  Patient was very anxious during the procedure and had difficulty lying still.  We were able to talk her through the procedure and she did quite well.  If further injection needed may need the use of IM or triazolam.  Procedure Details: Using a paramedian approach from the side mentioned above, the region overlying the inferior lamina was localized under fluoroscopic visualization and the soft tissues overlying this structure were infiltrated with 4 ml. of 1% Lidocaine without Epinephrine. A # 20 gauge, Tuohy needle was inserted into the epidural space using a paramedian approach.  The epidural space was localized using loss of resistance along with contralateral oblique bi-planar fluoroscopic views.  After negative aspirate for air, blood, and CSF, a 2 ml. volume of Isovue-250 was injected into the epidural space and the flow of contrast was observed. Radiographs were obtained for documentation purposes.   The injectate was administered into the level noted  above.  Additional Comments:  No complications occurred Dressing: 2 x 2 sterile gauze and Band-Aid    Post-procedure details: Patient was observed during the procedure. Post-procedure instructions were reviewed.  Patient left the clinic in stable condition.

## 2021-09-26 NOTE — Progress Notes (Signed)
Katie Benson - 41 y.o. female MRN [id number]  Date of birth: November 01, 1980  Office Visit Note: Visit Date: 09/13/2021 PCP: Ladell Pier, MD Referred by: Ladell Pier, MD  Subjective: No chief complaint on file.  HPI:  Katie Benson is a 41 y.o. female who comes in today at the request of Annie Main, PA-C for planned Right C7-T1 Cervical Interlaminar epidural steroid injection with fluoroscopic guidance.  The patient has failed conservative care including home exercise, medications, time and activity modification.  This injection will be diagnostic and hopefully therapeutic.  Please see requesting physician notes for further details and justification.   ROS Otherwise per HPI.  Assessment & Plan: Visit Diagnoses:    ICD-10-CM   1. Cervical radiculopathy  M54.12 XR C-ARM NO REPORT    Epidural Steroid injection    methylPREDNISolone acetate (DEPO-MEDROL) injection 80 mg      Plan: No additional findings.   Meds & Orders:  Meds ordered this encounter  Medications   methylPREDNISolone acetate (DEPO-MEDROL) injection 80 mg    Orders Placed This Encounter  Procedures   XR C-ARM NO REPORT   Epidural Steroid injection    Follow-up: Return for visit to requesting provider as needed.   Procedures: No procedures performed  Cervical Epidural Steroid Injection - Interlaminar Approach with Fluoroscopic Guidance  Patient: Katie Benson      Date of Birth: 01-30-1981 MRN: [id number] PCP: Ladell Pier, MD      Visit Date: 09/13/2021   Universal Protocol:    Date/Time: 07/31/236:41 AM  Consent Given By: the patient  Position: PRONE  Additional Comments: Vital signs were monitored before and after the procedure. Patient was prepped and draped in the usual sterile fashion. The correct patient, procedure, and site was verified.   Injection Procedure Details:   Procedure diagnoses: Cervical radiculopathy [M54.12]    Meds Administered:   Meds ordered this encounter  Medications   methylPREDNISolone acetate (DEPO-MEDROL) injection 80 mg     Laterality: Right  Location/Site: C7-T1  Needle: 3.5 in., 20 ga. Tuohy  Needle Placement: Paramedian epidural space  Findings:  -Comments: Excellent flow of contrast into the epidural space.  Patient was very anxious during the procedure and had difficulty lying still.  We were able to talk her through the procedure and she did quite well.  If further injection needed may need the use of IM or triazolam.  Procedure Details: Using a paramedian approach from the side mentioned above, the region overlying the inferior lamina was localized under fluoroscopic visualization and the soft tissues overlying this structure were infiltrated with 4 ml. of 1% Lidocaine without Epinephrine. A # 20 gauge, Tuohy needle was inserted into the epidural space using a paramedian approach.  The epidural space was localized using loss of resistance along with contralateral oblique bi-planar fluoroscopic views.  After negative aspirate for air, blood, and CSF, a 2 ml. volume of Isovue-250 was injected into the epidural space and the flow of contrast was observed. Radiographs were obtained for documentation purposes.   The injectate was administered into the level noted above.  Additional Comments:  No complications occurred Dressing: 2 x 2 sterile gauze and Band-Aid    Post-procedure details: Patient was observed during the procedure. Post-procedure instructions were reviewed.  Patient left the clinic in stable condition.   Clinical History: MRI CERVICAL SPINE WITHOUT CONTRAST   TECHNIQUE: Multiplanar, multisequence MR imaging of the cervical spine was performed. No intravenous contrast was administered.  COMPARISON:  CT scan 12/26/2012   FINDINGS: Alignment: No vertebral subluxation is observed.   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 Disc desiccation at all  levels between C2 and C7.   Cord: No significant abnormal spinal cord signal is observed.   Posterior Fossa, vertebral arteries, paraspinal tissues: Unremarkable   Disc levels:   C2-3: Unremarkable.   C3-4: Borderline bilateral foraminal stenosis due to disc bulge and uncinate spurring.   C4-5: Moderate to prominent bilateral foraminal stenosis and borderline central narrowing of the thecal sac due to disc bulge and uncinate spurring.   C5-6: Moderate right and moderate to prominent left foraminal stenosis and borderline central narrowing of the thecal sac due to disc bulge and uncinate spurring.   C6-7: Mild right and moderate left foraminal stenosis due to disc bulge and uncinate spurring.   C7-T1: No impingement.  Minimal uncinate spurring.   T1-2: Unremarkable.   IMPRESSION: 1. Cervical spondylosis, degenerative disc disease, and mildly congenitally short pedicles contribute to moderate to prominent impingement at C4-5; moderate impingement at C5-6; and mild impingement at C6-7, as detailed above.     Electronically Signed   By: Van Clines M.D.   On: 08/01/2020 09:25  ---------------------------------------------- EXAM: MRI LUMBAR SPINE WITHOUT CONTRAST   TECHNIQUE: Multiplanar, multisequence MR imaging of the lumbar spine was performed. No intravenous contrast was administered.   COMPARISON:  Lumbar spine x-rays dated June 23, 2021. CT abdomen pelvis dated March 26, 2020.   FINDINGS: Segmentation:  Standard.   Alignment:  Physiologic.   Vertebrae:  No fracture, evidence of discitis, or bone lesion.   Conus medullaris and cauda equina: Conus extends to the L1-L2 level. Conus and cauda equina appear normal.   Paraspinal and other soft tissues: Negative.   Disc levels:   T11-T12 to L2-L3: Negative.   L3-L4: Mild disc bulging and bilateral facet arthropathy. Mild bilateral neuroforaminal stenosis. No spinal canal stenosis.   L4-L5:  Negative disc. Mild bilateral facet arthropathy. Mild bilateral neuroforaminal stenosis. No spinal canal stenosis.   L5-S1: Negative disc. Mild bilateral facet arthropathy. No stenosis.   IMPRESSION: 1. Mild degenerative changes of the lower lumbar spine as described above. No high-grade stenosis or impingement.     Electronically Signed   By: Titus Dubin M.D.   On: 08/19/2021 16:36     Objective:  VS:  HT:    WT:   BMI:     BP:114/77  HR:98bpm  TEMP: ( )  RESP:  Physical Exam Vitals and nursing note reviewed.  Constitutional:      General: She is not in acute distress.    Appearance: Normal appearance. She is not ill-appearing.  HENT:     Head: Normocephalic and atraumatic.     Right Ear: External ear normal.     Left Ear: External ear normal.  Eyes:     Extraocular Movements: Extraocular movements intact.  Cardiovascular:     Rate and Rhythm: Normal rate.     Pulses: Normal pulses.  Musculoskeletal:     Cervical back: Tenderness present. No rigidity.     Right lower leg: No edema.     Left lower leg: No edema.     Comments: Patient has good strength in the upper extremities including 5 out of 5 strength in wrist extension long finger flexion and APB.  There is no atrophy of the hands intrinsically.  There is a negative Hoffmann's test.   Lymphadenopathy:     Cervical: No cervical adenopathy.  Skin:    Findings: No erythema, lesion or rash.  Neurological:     General: No focal deficit present.     Mental Status: She is alert and oriented to person, place, and time.     Sensory: No sensory deficit.     Motor: No weakness or abnormal muscle tone.     Coordination: Coordination normal.  Psychiatric:     Comments: Very anxious      Imaging: No results found.

## 2021-09-27 ENCOUNTER — Telehealth: Payer: Self-pay | Admitting: Orthopedic Surgery

## 2021-09-27 ENCOUNTER — Other Ambulatory Visit: Payer: Self-pay

## 2021-09-27 ENCOUNTER — Ambulatory Visit: Payer: Medicaid Other

## 2021-09-27 NOTE — Telephone Encounter (Signed)
Patient came in requesting Monovisc injection in left knee after 10/23/21 also her Handicap placard is about to expire this month and she needs a new one filled out

## 2021-09-28 ENCOUNTER — Ambulatory Visit: Payer: Self-pay | Attending: Physical Medicine and Rehabilitation

## 2021-09-28 ENCOUNTER — Other Ambulatory Visit: Payer: Self-pay

## 2021-09-28 DIAGNOSIS — M546 Pain in thoracic spine: Secondary | ICD-10-CM | POA: Insufficient documentation

## 2021-09-28 DIAGNOSIS — M545 Low back pain, unspecified: Secondary | ICD-10-CM | POA: Insufficient documentation

## 2021-09-28 DIAGNOSIS — M6281 Muscle weakness (generalized): Secondary | ICD-10-CM | POA: Insufficient documentation

## 2021-09-28 DIAGNOSIS — G8929 Other chronic pain: Secondary | ICD-10-CM | POA: Insufficient documentation

## 2021-09-28 DIAGNOSIS — M7918 Myalgia, other site: Secondary | ICD-10-CM | POA: Insufficient documentation

## 2021-09-28 DIAGNOSIS — M5459 Other low back pain: Secondary | ICD-10-CM | POA: Insufficient documentation

## 2021-09-28 NOTE — Telephone Encounter (Signed)
Ok thx.

## 2021-09-28 NOTE — Telephone Encounter (Signed)
Talked with patient earlier and advised her that [employer] no longer covers gel injections for Medicaid patients.  Stated that she doesn't have medicaid, but has financial assistance, which does not cover gel injections, but Wendy May is checking to see for sure.  I did advise patient that she could possibly pay OOP, but patient stated that she did not have any funds.  Patient then stated that she was going to report [place] due to her having a problem every time she comes to our office.

## 2021-09-28 NOTE — Therapy (Signed)
OUTPATIENT PHYSICAL THERAPY THORACOLUMBAR EVALUATION   Patient Name: Katie Benson MRN: [id number] DOB:Sep 12, 1980, 41 y.o., female Today's Date: 09/28/2021   PT End of Session - 09/28/21 1605     Visit Number 2    Number of Visits 5    Date for PT Re-Evaluation 11/09/21    Authorization Type MCD    PT Start Time 1220    PT Stop Time 1300    PT Time Calculation (min) 40 min    Activity Tolerance Patient tolerated treatment well    Behavior During Therapy WFL for tasks assessed/performed             Past Medical History:  Diagnosis Date   Anemia    Arthritis    Fibroids    GERD (gastroesophageal reflux disease)    History of kidney stones    Neck injury    Past Surgical History:  Procedure Laterality Date   CESAREAN SECTION     IR ANGIOGRAM PELVIS SELECTIVE OR SUPRASELECTIVE  03/18/2018   IR ANGIOGRAM PELVIS SELECTIVE OR SUPRASELECTIVE  03/18/2018   IR ANGIOGRAM SELECTIVE EACH ADDITIONAL VESSEL  03/18/2018   IR ANGIOGRAM SELECTIVE EACH ADDITIONAL VESSEL  03/18/2018   IR EMBO TUMOR ORGAN ISCHEMIA INFARCT INC GUIDE ROADMAPPING  03/18/2018   IR RADIOLOGIST EVAL & MGMT  10/17/2017   IR RADIOLOGIST EVAL & MGMT  12/20/2017   IR RADIOLOGIST EVAL & MGMT  04/17/2018   IR US GUIDE VASC ACCESS RIGHT  03/18/2018   KNEE ARTHROSCOPY WITH LATERAL MENISECTOMY Left 12/24/2018   Procedure: LEFT KNEE ARTHROSCOPY WITH LATERAL MENISECTOM, debriment of GANGLION CYST;  Surgeon: [name], MD;  Location: [facility];  Service: Orthopedics;  Laterality: Left;   PARATHYROIDECTOMY N/A 02/12/2018   Procedure: PARATHYROIDECTOMY;  Surgeon: Fredirick Maudlin, MD;  Location: [facility];  Service: General;  Laterality: N/A;   WISDOM TOOTH EXTRACTION     age 20   Patient Active Problem List   Diagnosis Date Noted   Osteoarthritis of left knee 12/14/2020   Subacromial bursitis of right shoulder joint 07/15/2020   Cervical radiculopathy 06/24/2020   Effusion, left knee 06/24/2019   Pes anserine  bursitis 04/29/2019   Bucket handle tear of lateral meniscus of left knee 12/12/2018   Ganglion cyst 12/12/2018   Fibroids 03/18/2018   S/P parathyroidectomy ([place]) 02/12/2018   Primary hyperparathyroidism ([place]) 01/08/2018   Hyperparathyroidism , secondary, non-renal ([place]) 01/08/2018   Fibroid uterus 12/06/2017   Hypercalcemia 12/06/2017   Fibroid 10/15/2017   Abnormal uterine bleeding (AUB) 08/20/2017   Anemia 08/20/2017    PCP: Ladell Pier, MD PCP - General  REFERRING PROVIDER: Lorine Bears, NP  REFERRING DIAG: [phone] (ICD-10-CM) - Chronic right-sided low back pain without sciatica M54.6 (ICD-10-CM) - Acute right-sided thoracic back pain M79.18 (ICD-10-CM) - Myofascial pain syndrome   Rationale for Evaluation and Treatment Rehabilitation  THERAPY DIAG: myofascial back pain   ONSET DATE: chronic  SUBJECTIVE:  SUBJECTIVE STATEMENT: Describes a history of R sided low back pain, chronic in nature recently exacerbated.  Denies radiating symptoms or paresthesias.  Unable to isolate aggravating relieving factors.   PERTINENT HISTORY:  HPI: Katie Benson is a 41 y.o. female who comes in today for evaluation of acute on chronic right sided lower back pain radiating to buttock and up to right thoracic back. Patient reports right sided back issues for many years which she contributes to issues with left knee and poor posture. Her pain increased significantly approximately 1 week ago after she stood up from couch. Patient states her pain is exacerbated by movement and activity, describes her pain as sore, aching and tight, currently rates as 7 out of 10. Patient reports some relief of pain with home exercise regimen, rest and use of medications. Patient states she is currently taking  Gabapentin and using Flexeril at bedtime. Patient did attend 1 session of formal physical therapy with our in house team back in May, she has not returned since. Patient states there was an issues with her financial assistance but hopes to have this worked out soon. Patients recent lumbar MRI imaging exhibits mild degenerative changes of the lumbar spine, no spinal canal stenosis, no nerve impingement. Patient states her pain is negatively impacting her daily life. Patient has history of chronic left knee issues, she does have advanced osteoarthritis to left knee, previously had left knee arthroscopy with lateral menisectomy in 2020 by Dr. Joni Fears. We have previously seen patient in our office for chronic right sided neck pain with radicular symptoms, she is scheduled for cervical epidural steroid injection with [place] on 09/13/2021. Patient denies focal weakness, numbness and tingling. Patient denies recent trauma or falls.    PAIN:  Are you having pain? Yes: NPRS scale: 10/10 Pain location: R low back Pain description: ache Aggravating factors: activity Relieving factors: heat, rest and pain patches all together  10+/10 pain with 114/83 BP reading  PRECAUTIONS: None  WEIGHT BEARING RESTRICTIONS No  FALLS:  Has patient fallen in last 6 months? No  LIVING ENVIRONMENT: Lives with: lives with their family Lives in: House/apartment Stairs:  yes Has following equipment at home: None  OCCUPATION: not working  PLOF: Independent  PATIENT GOALS To reduce and manage my low back pain   OBJECTIVE:   DIAGNOSTIC FINDINGS:  EXAM: MRI LUMBAR SPINE WITHOUT CONTRAST   TECHNIQUE: Multiplanar, multisequence MR imaging of the lumbar spine was performed. No intravenous contrast was administered.   COMPARISON:  Lumbar spine x-rays dated June 23, 2021. CT abdomen pelvis dated March 26, 2020.   FINDINGS: Segmentation:  Standard.   Alignment:  Physiologic.   Vertebrae:  No fracture,  evidence of discitis, or bone lesion.   Conus medullaris and cauda equina: Conus extends to the L1-L2 level. Conus and cauda equina appear normal.   Paraspinal and other soft tissues: Negative.   Disc levels:   T11-T12 to L2-L3: Negative.   L3-L4: Mild disc bulging and bilateral facet arthropathy. Mild bilateral neuroforaminal stenosis. No spinal canal stenosis.   L4-L5: Negative disc. Mild bilateral facet arthropathy. Mild bilateral neuroforaminal stenosis. No spinal canal stenosis.   L5-S1: Negative disc. Mild bilateral facet arthropathy. No stenosis.   IMPRESSION: 1. Mild degenerative changes of the lower lumbar spine as described above. No high-grade stenosis or impingement.     Electronically Signed   By: Titus Dubin M.D.   On: 08/19/2021 16:36  PATIENT SURVEYS:  Modified Oswestry 32/50   SCREENING FOR  RED FLAGS: Bowel or bladder incontinence: No  COGNITION:  Overall cognitive status: Within functional limits for tasks assessed     SENSATION: Not tested  MUSCLE LENGTH: Hamstrings: Right 30 deg; Left 30 deg, (observed to long sit w/o difficulty in eval despite 30d SLR B) Thomas test: UTA due to pain  POSTURE: No Significant postural limitations  PALPATION: Exquisite superficial tenderness to R lumbar paraspinals, R gluteals and R piriformis regions.   LUMBAR ROM:   Active  A/PROM  eval  Flexion 50%  Extension 0%  Right lateral flexion 50%  Left lateral flexion 50%  Right rotation 90%  Left rotation 50%   (Blank rows = not tested)  LOWER EXTREMITY ROM:   Observed to be [facility] when distracted   Active  Right eval Left eval  Hip flexion    Hip extension    Hip abduction    Hip adduction    Hip internal rotation    Hip external rotation    Knee flexion    Knee extension    Ankle dorsiflexion    Ankle plantarflexion    Ankle inversion    Ankle eversion     (Blank rows = not tested)  LOWER EXTREMITY MMT:  UTA accurately due to pain  levels  MMT Right eval Left eval  Hip flexion    Hip extension    Hip abduction    Hip adduction    Hip internal rotation    Hip external rotation    Knee flexion    Knee extension    Ankle dorsiflexion    Ankle plantarflexion    Ankle inversion    Ankle eversion     (Blank rows = not tested)  LUMBAR SPECIAL TESTS:  Straight leg raise test: Negative, Slump test: Negative, FABER test: UTA due to pain, and Thomas test: UTA due to pain  FUNCTIONAL TESTS:  30 seconds chair stand test 2 stands   GAIT: Distance walked: 82f x2 Assistive device utilized: None Level of assistance: Complete Independence Comments: guarded gait     TODAY'S TREATMENT  Eval   PATIENT EDUCATION:  Education details: Discussed eval findings, rehab rationale and POC and patient is in agreement  Person educated: Patient Education method: Explanation Education comprehension: verbalized understanding and needs further education   HOME EXERCISE PROGRAM: Access Code: [email or web address]: [email or web address] Date: 09/28/2021 Prepared by: JSharlynn Oliphant Exercises - Sit to Stand with Arms Crossed  - 2 x daily - 7 x weekly - 1 sets - 5 reps  ASSESSMENT:  CLINICAL IMPRESSION: Patient is a 41y.o. female who was seen today for physical therapy evaluation and treatment for acute on chronic R sided back pain. Scope of eval limited due to pain levels and inability to tolerate testing positions.  Patient not appropriate for TPDN at this time due to exquisite TTP of superficial R lumbosacral as well a intolerance to prone.  AROM markedly reduce due to pain, 30s chair stand    OBJECTIVE IMPAIRMENTS decreased activity tolerance, decreased knowledge of condition, decreased mobility, difficulty walking, decreased ROM, impaired perceived functional ability, increased muscle spasms, impaired flexibility, and pain.   ACTIVITY LIMITATIONS carrying, lifting, bending, sitting, standing, squatting,  sleeping, stairs, and bed mobility  PARTICIPATION LIMITATIONS: meal prep, cleaning, medication management, driving, community activity, and occupation  PERSONAL FACTORS Age, Behavior pattern, and Time since onset of injury/illness/exacerbation are also affecting patient's functional outcome.   REHAB POTENTIAL: Fair based on chronicity of symptoms  as well as undermined nature of  pain and poor tolerance to prolonged positionin.  CLINICAL DECISION MAKING: Evolving/moderate complexity  EVALUATION COMPLEXITY: Moderate   GOALS: Goals reviewed with patient? Yes  SHORT TERM GOALS: Target date: 10/26/2021  Patient to demonstrate tolerance to a HEP focused on self stretching Baseline:[id number] Goal status: INITIAL  2.  Decrease pain to 6/10 at best Baseline: 7/10 at best today Goal status: INITIAL  3.  Increase AROM lumbar to 75% throughout Baseline:  Active  A/PROM  eval  Flexion 50%  Extension 0%  Right lateral flexion 50%  Left lateral flexion 50%  Right rotation 90%  Left rotation 50%   Goal status: INITIAL  4.  Decrease ODI score to 25/50 Baseline: 32/50 Goal status: INITIAL     PLAN: PT FREQUENCY: 1x/week  PT DURATION: 4 weeks  PLANNED INTERVENTIONS: Therapeutic exercises, Therapeutic activity, Neuromuscular re-education, Balance training, Gait training, Patient/Family education, Self Care, Joint mobilization, Dry Needling, Spinal mobilization, Manual therapy, and Re-evaluation.  PLAN FOR NEXT SESSION: assess tolerance to HEP and add stretching tasks accordingly, aerobic work.   Lanice Shirts, PT 09/28/2021, 4:08 PM   Check all possible CPT codes: [phone] - PT Re-evaluation, 97110- Therapeutic Exercise, [phone]- Neuro Re-education, [phone] - Gait Training, [phone] - Manual Therapy, 97530 - Therapeutic Activities, and [phone] - Self Care     If treatment provided at initial evaluation, no treatment charged due to lack of authorization.

## 2021-10-03 ENCOUNTER — Other Ambulatory Visit: Payer: Self-pay

## 2021-10-03 NOTE — Therapy (Unsigned)
OUTPATIENT PHYSICAL THERAPY TREATMENT NOTE   Patient Name: Katie Benson MRN: [id number] DOB:21-Sep-1980, 41 y.o., female Today's Date: 10/05/2021  PCP: Ladell Pier, MD REFERRING PROVIDER:   END OF SESSION:   PT End of Session - 10/05/21 1053     Visit Number 3    Number of Visits 5    Date for PT Re-Evaluation 11/09/21    PT Start Time 1050    PT Stop Time 1130    PT Time Calculation (min) 40 min    Activity Tolerance Patient tolerated treatment well    Behavior During Therapy WFL for tasks assessed/performed             Past Medical History:  Diagnosis Date   Anemia    Arthritis    Fibroids    GERD (gastroesophageal reflux disease)    History of kidney stones    Neck injury    Past Surgical History:  Procedure Laterality Date   CESAREAN SECTION     IR ANGIOGRAM PELVIS SELECTIVE OR SUPRASELECTIVE  03/18/2018   IR ANGIOGRAM PELVIS SELECTIVE OR SUPRASELECTIVE  03/18/2018   IR ANGIOGRAM SELECTIVE EACH ADDITIONAL VESSEL  03/18/2018   IR ANGIOGRAM SELECTIVE EACH ADDITIONAL VESSEL  03/18/2018   IR EMBO TUMOR ORGAN ISCHEMIA INFARCT INC GUIDE ROADMAPPING  03/18/2018   IR RADIOLOGIST EVAL & MGMT  10/17/2017   IR RADIOLOGIST EVAL & MGMT  12/20/2017   IR RADIOLOGIST EVAL & MGMT  04/17/2018   IR US GUIDE VASC ACCESS RIGHT  03/18/2018   KNEE ARTHROSCOPY WITH LATERAL MENISECTOMY Left 12/24/2018   Procedure: LEFT KNEE ARTHROSCOPY WITH LATERAL MENISECTOM, debriment of GANGLION CYST;  Surgeon: [name], MD;  Location: [facility];  Service: Orthopedics;  Laterality: Left;   PARATHYROIDECTOMY N/A 02/12/2018   Procedure: PARATHYROIDECTOMY;  Surgeon: Fredirick Maudlin, MD;  Location: [facility];  Service: General;  Laterality: N/A;   WISDOM TOOTH EXTRACTION     age 41   Patient Active Problem List   Diagnosis Date Noted   Osteoarthritis of left knee 12/14/2020   Subacromial bursitis of right shoulder joint 07/15/2020   Cervical radiculopathy 06/24/2020   Effusion,  left knee 06/24/2019   Pes anserine bursitis 04/29/2019   Bucket handle tear of lateral meniscus of left knee 12/12/2018   Ganglion cyst 12/12/2018   Fibroids 03/18/2018   S/P parathyroidectomy ([place]) 02/12/2018   Primary hyperparathyroidism ([place]) 01/08/2018   Hyperparathyroidism , secondary, non-renal ([place]) 01/08/2018   Fibroid uterus 12/06/2017   Hypercalcemia 12/06/2017   Fibroid 10/15/2017   Abnormal uterine bleeding (AUB) 08/20/2017   Anemia 08/20/2017    REFERRING DIAG: 54.50,G89.29 (ICD-10-CM) - Chronic right-sided low back pain without sciatica M54.6 (ICD-10-CM) - Acute right-sided thoracic back pain M79.18 (ICD-10-CM) - Myofascial pain syndrome   THERAPY DIAG: myofascial back pain   Rationale for Evaluation and Treatment Rehabilitation  PERTINENT HISTORY: I: Katie Benson is a 41 y.o. female who comes in today for evaluation of acute on chronic right sided lower back pain radiating to buttock and up to right thoracic back. Patient reports right sided back issues for many years which she contributes to issues with left knee and poor posture. Her pain increased significantly approximately 1 week ago after she stood up from couch. Patient states her pain is exacerbated by movement and activity, describes her pain as sore, aching and tight, currently rates as 7 out of 10. Patient reports some relief of pain with home exercise regimen, rest and use of medications. Patient states  she is currently taking Gabapentin and using Flexeril at bedtime. Patient did attend 1 session of formal physical therapy with our in house team back in May, she has not returned since. Patient states there was an issues with her financial assistance but hopes to have this worked out soon. Patients recent lumbar MRI imaging exhibits mild degenerative changes of the lumbar spine, no spinal canal stenosis, no nerve impingement. Patient states her pain is negatively impacting her daily life. Patient has history  of chronic left knee issues, she does have advanced osteoarthritis to left knee, previously had left knee arthroscopy with lateral menisectomy in 2020 by Dr. Joni Fears. We have previously seen patient in our office for chronic right sided neck pain with radicular symptoms, she is scheduled for cervical epidural steroid injection with [place] on 09/13/2021. Patient denies focal weakness, numbness and tingling. Patient denies recent trauma or falls.    PRECAUTIONS: None  SUBJECTIVE: Reporting 25/10 pain in low back as well as L knee.  Was unable to perform STS exercises due to increased pain levels, and needed to rest and medicate since her last assessment which was her evaluation.  Patient fixated on L knee pain today.    PAIN:  Are you having pain? Yes: NPRS scale: "25"/10 Pain location: R low back Pain description: ache Aggravating factors: activity Relieving factors: undetermined   OBJECTIVE: (objective measures completed at initial evaluation unless otherwise dated)   DIAGNOSTIC FINDINGS:  EXAM: MRI LUMBAR SPINE WITHOUT CONTRAST   TECHNIQUE: Multiplanar, multisequence MR imaging of the lumbar spine was performed. No intravenous contrast was administered.   COMPARISON:  Lumbar spine x-rays dated June 23, 2021. CT abdomen pelvis dated March 26, 2020.   FINDINGS: Segmentation:  Standard.   Alignment:  Physiologic.   Vertebrae:  No fracture, evidence of discitis, or bone lesion.   Conus medullaris and cauda equina: Conus extends to the L1-L2 level. Conus and cauda equina appear normal.   Paraspinal and other soft tissues: Negative.   Disc levels:   T11-T12 to L2-L3: Negative.   L3-L4: Mild disc bulging and bilateral facet arthropathy. Mild bilateral neuroforaminal stenosis. No spinal canal stenosis.   L4-L5: Negative disc. Mild bilateral facet arthropathy. Mild bilateral neuroforaminal stenosis. No spinal canal stenosis.   L5-S1: Negative disc. Mild bilateral facet  arthropathy. No stenosis.   IMPRESSION: 1. Mild degenerative changes of the lower lumbar spine as described above. No high-grade stenosis or impingement.     Electronically Signed   By: Titus Dubin M.D.   On: 08/19/2021 16:36   PATIENT SURVEYS:  Modified Oswestry 32/50    SCREENING FOR RED FLAGS: Bowel or bladder incontinence: No   COGNITION:           Overall cognitive status: Within functional limits for tasks assessed                          SENSATION: Not tested   MUSCLE LENGTH: Hamstrings: Right 30 deg; Left 30 deg, (observed to long sit w/o difficulty in eval despite 30d SLR B) Thomas test: UTA due to pain   POSTURE: No Significant postural limitations   PALPATION: Exquisite superficial tenderness to R lumbar paraspinals, R gluteals and R piriformis regions.    LUMBAR ROM:    Active  A/PROM  eval  Flexion 50%  Extension 0%  Right lateral flexion 50%  Left lateral flexion 50%  Right rotation 90%  Left rotation 50%   (Blank rows =  not tested)   LOWER EXTREMITY ROM:   Observed to be [facility] when distracted    Active  Right eval Left eval  Hip flexion      Hip extension      Hip abduction      Hip adduction      Hip internal rotation      Hip external rotation      Knee flexion      Knee extension      Ankle dorsiflexion      Ankle plantarflexion      Ankle inversion      Ankle eversion       (Blank rows = not tested)   LOWER EXTREMITY MMT:  UTA accurately due to pain levels   MMT Right eval Left eval  Hip flexion      Hip extension      Hip abduction      Hip adduction      Hip internal rotation      Hip external rotation      Knee flexion      Knee extension      Ankle dorsiflexion      Ankle plantarflexion      Ankle inversion      Ankle eversion       (Blank rows = not tested)   LUMBAR SPECIAL TESTS:  Straight leg raise test: Negative, Slump test: Negative, FABER test: UTA due to pain, and Thomas test: UTA due to pain    FUNCTIONAL TESTS:  30 seconds chair stand test 2 stands    GAIT: Distance walked: 58f x2 Assistive device utilized: None Level of assistance: Complete Independence Comments: guarded gait        TODAY'S TREATMENT  [facility] Adult PT Treatment:                                                DATE: 10/05/21 Therapeutic Exercise: Nustep L1  Seated hamstring stretch B 30s x2 SKTC B 30s x2  Open book 10x B  Supine march, alt. 15/15     PATIENT EDUCATION:  Education details: Discussed eval findings, rehab rationale and POC and patient is in agreement  Person educated: Patient Education method: Explanation Education comprehension: verbalized understanding and needs further education     HOME EXERCISE PROGRAM: Access Code: [email or web address]: [email or web address] Date: 09/28/2021 Prepared by: JSharlynn Oliphant  Exercises - Sit to Stand with Arms Crossed  - 2 x daily - 7 x weekly - 1 sets - 5 reps   ASSESSMENT:   CLINICAL IMPRESSION: Patient returns for fist f/u session with increased pain, 25/10 reported.  initiated aerobic work, stretching tasks.  Patient presents with slow guarded movement patterns, apprehensive towards activity.  Attempted to improve overall mobility through fascial system but poor tolerance to stretch and rotational motions encountered.  Pain levels have decreased from 50/10 to 25/10.        OBJECTIVE IMPAIRMENTS decreased activity tolerance, decreased knowledge of condition, decreased mobility, difficulty walking, decreased ROM, impaired perceived functional ability, increased muscle spasms, impaired flexibility, and pain.    ACTIVITY LIMITATIONS carrying, lifting, bending, sitting, standing, squatting, sleeping, stairs, and bed mobility   PARTICIPATION LIMITATIONS: meal prep, cleaning, medication management, driving, community activity, and occupation   PERSONAL FACTORS Age, Behavior pattern, and Time since onset of injury/illness/exacerbation are also  affecting patient's functional  outcome.    REHAB POTENTIAL: Fair based on chronicity of symptoms  as well as undermined nature of pain and poor tolerance to prolonged positionin.   CLINICAL DECISION MAKING: Evolving/moderate complexity   EVALUATION COMPLEXITY: Moderate     GOALS: Goals reviewed with patient? Yes   SHORT TERM GOALS: Target date: 10/26/2021   Patient to demonstrate tolerance to a HEP focused on self stretching Baseline:[id number] Goal status: INITIAL   2.  Decrease pain to 6/10 at best Baseline: 7/10 at best today Goal status: INITIAL   3.  Increase AROM lumbar to 75% throughout Baseline:  Active  A/PROM  eval  Flexion 50%  Extension 0%  Right lateral flexion 50%  Left lateral flexion 50%  Right rotation 90%  Left rotation 50%    Goal status: INITIAL   4.  Decrease ODI score to 25/50 Baseline: 32/50 Goal status: INITIAL         PLAN: PT FREQUENCY: 1x/week   PT DURATION: 4 weeks   PLANNED INTERVENTIONS: Therapeutic exercises, Therapeutic activity, Neuromuscular re-education, Balance training, Gait training, Patient/Family education, Self Care, Joint mobilization, Dry Needling, Spinal mobilization, Manual therapy, and Re-evaluation.   PLAN FOR NEXT SESSION: assess tolerance to HEP and add stretching tasks accordingly, aerobic work.    Lanice Shirts, PT 10/05/2021, 12:13 PM

## 2021-10-05 ENCOUNTER — Ambulatory Visit: Payer: Self-pay

## 2021-10-05 DIAGNOSIS — M6281 Muscle weakness (generalized): Secondary | ICD-10-CM

## 2021-10-05 DIAGNOSIS — M545 Low back pain, unspecified: Secondary | ICD-10-CM

## 2021-10-05 DIAGNOSIS — M5459 Other low back pain: Secondary | ICD-10-CM

## 2021-10-07 ENCOUNTER — Other Ambulatory Visit: Payer: Self-pay

## 2021-10-10 ENCOUNTER — Encounter: Payer: Self-pay | Admitting: Nurse Practitioner

## 2021-10-10 ENCOUNTER — Other Ambulatory Visit: Payer: Self-pay

## 2021-10-10 ENCOUNTER — Ambulatory Visit: Payer: Medicaid Other | Attending: Nurse Practitioner | Admitting: Nurse Practitioner

## 2021-10-10 ENCOUNTER — Other Ambulatory Visit: Payer: Self-pay | Admitting: Physical Medicine and Rehabilitation

## 2021-10-10 ENCOUNTER — Telehealth: Payer: Self-pay | Admitting: Physical Medicine and Rehabilitation

## 2021-10-10 VITALS — BP 121/85 | HR 115 | Temp 98.0°F | Ht 62.0 in | Wt 133.6 lb

## 2021-10-10 DIAGNOSIS — G8929 Other chronic pain: Secondary | ICD-10-CM

## 2021-10-10 DIAGNOSIS — M545 Low back pain, unspecified: Secondary | ICD-10-CM

## 2021-10-10 MED ORDER — METHOCARBAMOL 500 MG PO TABS
500.0000 mg | ORAL_TABLET | Freq: Three times a day (TID) | ORAL | 0 refills | Status: DC
Start: 2021-10-10 — End: 2021-11-29
  Filled 2021-10-10: qty 90, 30d supply, fill #0

## 2021-10-10 NOTE — Progress Notes (Signed)
Assessment & Plan:  Katie Benson was seen today for medication refill.  Diagnoses and all orders for this visit:  Chronic bilateral low back pain without sciatica Patient became angry and refused for me to refill her methocarbamol   Patient has been counseled on age-appropriate routine health concerns for screening and prevention. These are reviewed and up-to-date. Referrals have been placed accordingly. Immunizations are up-to-date or declined.    Subjective:   Chief Complaint  Patient presents with   Medication Refill   Medication Refill Pertinent negatives include no chest pain, coughing, fever, headaches, myalgias, nausea or vomiting.   Katie Benson 41 y.o. female presents to office today to request refills of methocarbamol.   She became very angry when I instructed her that I would need to contact the orthopedic office to verify that she should be taking methocarbamol and cyclobenzaprine together since she was not given any refills on the methocarbamol. She became angry and then declined any other assistance from me and told me she wanted to see her PCP from now on.  I did instruct her that the reason I had to confirm was she had no refills on methocarbamol that was prescribed last month so I was not sure if she had been switched to cyclobenzaprine solely at night and continue gabapentin or could take all 3. She had also initially requested that I apply her analgesic gel to her back however once she became angry about the methocarbamol she stated she did not want me to apply anything to her back. She would get someone else to do it.      Review of Systems  Constitutional:  Negative for fever, malaise/fatigue and weight loss.  HENT: Negative.  Negative for nosebleeds.   Eyes: Negative.  Negative for blurred vision, double vision and photophobia.  Respiratory: Negative.  Negative for cough and shortness of breath.   Cardiovascular: Negative.  Negative for chest pain,  palpitations and leg swelling.  Gastrointestinal: Negative.  Negative for heartburn, nausea and vomiting.  Musculoskeletal:  Positive for back pain. Negative for myalgias.  Neurological: Negative.  Negative for dizziness, focal weakness, seizures and headaches.  Psychiatric/Behavioral: Negative.  Negative for suicidal ideas.     Past Medical History:  Diagnosis Date   Anemia    Arthritis    Fibroids    GERD (gastroesophageal reflux disease)    History of kidney stones    Neck injury     Past Surgical History:  Procedure Laterality Date   CESAREAN SECTION     IR ANGIOGRAM PELVIS SELECTIVE OR SUPRASELECTIVE  03/18/2018   IR ANGIOGRAM PELVIS SELECTIVE OR SUPRASELECTIVE  03/18/2018   IR ANGIOGRAM SELECTIVE EACH ADDITIONAL VESSEL  03/18/2018   IR ANGIOGRAM SELECTIVE EACH ADDITIONAL VESSEL  03/18/2018   IR EMBO TUMOR ORGAN ISCHEMIA INFARCT INC GUIDE ROADMAPPING  03/18/2018   IR RADIOLOGIST EVAL & MGMT  10/17/2017   IR RADIOLOGIST EVAL & MGMT  12/20/2017   IR RADIOLOGIST EVAL & MGMT  04/17/2018   IR US GUIDE VASC ACCESS RIGHT  03/18/2018   KNEE ARTHROSCOPY WITH LATERAL MENISECTOMY Left 12/24/2018   Procedure: LEFT KNEE ARTHROSCOPY WITH LATERAL MENISECTOM, debriment of GANGLION CYST;  Surgeon: [name], MD;  Location: [facility];  Service: Orthopedics;  Laterality: Left;   PARATHYROIDECTOMY N/A 02/12/2018   Procedure: PARATHYROIDECTOMY;  Surgeon: Fredirick Maudlin, MD;  Location: [facility];  Service: General;  Laterality: N/A;   WISDOM TOOTH EXTRACTION     age 28    Family History  Problem Relation Age of Onset   Diabetes Sister    Cancer Maternal Grandmother        ovarian   Hypercalcemia Mother    Other Father        HX Unknown    Social History Reviewed with no changes to be made today.   Outpatient Medications Prior to Visit  Medication Sig Dispense Refill   AMBULATORY NON FORMULARY MEDICATION Medication Name:  Nitroglycerin ointment 0.125% three times daily use pea sized  amount per rectum for 6-8 weeks 30 g 1   cyclobenzaprine (FLEXERIL) 10 MG tablet Take 1 tablet (10 mg total) by mouth 2 (two) times daily. 30 tablet 2   desipramine (NORPRAMIN) 25 MG tablet Take 1 tablet (25 mg total) by mouth at bedtime. 30 tablet 5   desipramine (NORPRAMIN) 25 MG tablet take 1 tab by mouth at bedtime 30 tablet 5   dicyclomine (BENTYL) 20 MG tablet TAKE 1 TABLET BY MOUTH EVERY 6 HOURS AS NEEDED FOR SPASMS 30 tablet 3   gabapentin (NEURONTIN) 100 MG capsule Take 1 capsule (100 mg total) by mouth 3 (three) times daily. 90 capsule 1   hydrocortisone (ANUSOL-HC) 2.5 % rectal cream Place 1 application. rectally 2 (two) times daily as needed for hemorrhoids or anal itching. 30 g 0   hyoscyamine (LEVBID) 0.375 MG 12 hr tablet Take 1 tablet (0.375 mg total) by mouth 2 (two) times daily. 60 tablet 5   hyoscyamine (LEVBID) 0.375 MG 12 hr tablet Take 1 tabley by mouth twice daily 60 tablet 5   Lidocaine, Anorectal, (RECTICARE) 5 % CREA Apply 1 Application topically every 4 (four) hours as needed. 28 g 1   Lidocaine-Adhesive Sheets (LIDOPURE PATCH) 5 % KIT Apply 5 patches topically daily as needed. 30 kit 1   methocarbamol (ROBAXIN) 500 MG tablet Take 500 mg by mouth 3 (three) times daily.     pantoprazole (PROTONIX) 40 MG tablet Take 1 tablet (40 mg total) by mouth daily. 90 tablet 3   meloxicam (MOBIC) 15 MG tablet Take 1 tablet (15 mg total) by mouth daily. (Patient not taking: Reported on 10/10/2021) 30 tablet 1   polyethylene glycol (MIRALAX / GLYCOLAX) 17 g packet Take 17 g by mouth daily. (Patient not taking: Reported on 10/10/2021)     Facility-Administered Medications Prior to Visit  Medication Dose Route Frequency Provider Last Rate Last Admin   0.9 %  sodium chloride infusion  500 mL Intravenous Once Nandigam, Venia Minks, MD        Allergies  Allergen Reactions   Pork-Derived Products Hives and Swelling       Objective:    BP 121/85   Pulse (!) 115   Temp 98 F (36.7 C)  (Oral)   Ht '5\' 2"'  (1.575 m)   Wt 133 lb 9.6 oz (60.6 kg)   BMI 24.44 kg/m  Wt Readings from Last 3 Encounters:  10/10/21 133 lb 9.6 oz (60.6 kg)  08/25/21 134 lb (60.8 kg)  07/07/21 134 lb (60.8 kg)    Physical Exam Vitals and nursing note reviewed.  Constitutional:      Appearance: She is well-developed.  HENT:     Head: Normocephalic and atraumatic.  Cardiovascular:     Rate and Rhythm: Normal rate and regular rhythm.     Heart sounds: Normal heart sounds. No murmur heard.    No friction rub. No gallop.  Pulmonary:     Effort: Pulmonary effort is normal. No tachypnea or respiratory distress.  Breath sounds: Normal breath sounds. No decreased breath sounds, wheezing, rhonchi or rales.  Chest:     Chest wall: No tenderness.  Abdominal:     General: Bowel sounds are normal.     Palpations: Abdomen is soft.  Musculoskeletal:        General: Normal range of motion.     Cervical back: Normal range of motion.  Skin:    General: Skin is warm and dry.  Neurological:     Mental Status: She is alert and oriented to person, place, and time.     Coordination: Coordination normal.  Psychiatric:        Behavior: Behavior normal. Behavior is cooperative.        Thought Content: Thought content normal.        Judgment: Judgment normal.          Patient has been counseled extensively about nutrition and exercise as well as the importance of adherence with medications and regular follow-up. The patient was given clear instructions to go to ER or return to medical center if symptoms don't improve, worsen or new problems develop. The patient verbalized understanding.   Follow-up: Return in about 3 months (around 01/10/2022) for FOLLOW WITH DR> JOHNSON.   Gildardo Pounds, FNP-BC [facility], [place]   10/10/2021, 2:05 PM

## 2021-10-10 NOTE — Telephone Encounter (Signed)
Megan,  Please see my office note. I am sorry you had to be pulled from taking care of patients to handle something I could have easily fixed for her.  I offered to fill her medication after confirming with you through a secure chart and she became angry and told me not to worry about refilling it. I do apologize for any inconvenience and I certainly would have been happy to fill it for her however she couldn't seem to understand what I was trying to relay to her about the prescription having expired. I hope you have a great rest of the week!!   Salem Lembke

## 2021-10-10 NOTE — Telephone Encounter (Signed)
Patient came in stating she tried going to her PCP to get her Methocarbamol refilled and her PCP stated she will not refill it and to ask [place] if we will please advise she only has about 2 days worth of medication left, she would like these refilled being she does not want to take the stronger medication during the day so she can still function

## 2021-10-10 NOTE — Progress Notes (Signed)
Lower back pain 

## 2021-10-12 ENCOUNTER — Ambulatory Visit: Payer: Self-pay

## 2021-10-12 DIAGNOSIS — M5459 Other low back pain: Secondary | ICD-10-CM

## 2021-10-12 DIAGNOSIS — M6281 Muscle weakness (generalized): Secondary | ICD-10-CM

## 2021-10-12 DIAGNOSIS — M545 Low back pain, unspecified: Secondary | ICD-10-CM

## 2021-10-12 NOTE — Therapy (Addendum)
OUTPATIENT PHYSICAL THERAPY TREATMENT NOTE/DC SUMMARY   Patient Name: Katie Benson MRN: [id number] DOB:01/03/1981, 41 y.o., female Today's Date: 10/12/2021  PCP: Ladell Pier, MD REFERRING PROVIDER: Ladell Pier, MD PHYSICAL THERAPY DISCHARGE SUMMARY  Visits from Start of Care: 4  Current functional level related to goals / functional outcomes: UTA   Remaining deficits: UTA   Education / Equipment: HEP   Patient agrees to discharge. Patient goals were partially met. Patient is being discharged due to not returning since the last visit.  END OF SESSION:   PT End of Session - 10/12/21 1320     Visit Number 4    Number of Visits 5    Date for PT Re-Evaluation 11/09/21    PT Start Time 1320    PT Stop Time 1400    PT Time Calculation (min) 40 min    Activity Tolerance Patient tolerated treatment well    Behavior During Therapy WFL for tasks assessed/performed             Past Medical History:  Diagnosis Date   Anemia    Arthritis    Fibroids    GERD (gastroesophageal reflux disease)    History of kidney stones    Neck injury    Past Surgical History:  Procedure Laterality Date   CESAREAN SECTION     IR ANGIOGRAM PELVIS SELECTIVE OR SUPRASELECTIVE  03/18/2018   IR ANGIOGRAM PELVIS SELECTIVE OR SUPRASELECTIVE  03/18/2018   IR ANGIOGRAM SELECTIVE EACH ADDITIONAL VESSEL  03/18/2018   IR ANGIOGRAM SELECTIVE EACH ADDITIONAL VESSEL  03/18/2018   IR EMBO TUMOR ORGAN ISCHEMIA INFARCT INC GUIDE ROADMAPPING  03/18/2018   IR RADIOLOGIST EVAL & MGMT  10/17/2017   IR RADIOLOGIST EVAL & MGMT  12/20/2017   IR RADIOLOGIST EVAL & MGMT  04/17/2018   IR US GUIDE VASC ACCESS RIGHT  03/18/2018   KNEE ARTHROSCOPY WITH LATERAL MENISECTOMY Left 12/24/2018   Procedure: LEFT KNEE ARTHROSCOPY WITH LATERAL MENISECTOM, debriment of GANGLION CYST;  Surgeon: [name], MD;  Location: [facility];  Service: Orthopedics;  Laterality: Left;   PARATHYROIDECTOMY N/A 02/12/2018    Procedure: PARATHYROIDECTOMY;  Surgeon: Fredirick Maudlin, MD;  Location: [facility];  Service: General;  Laterality: N/A;   WISDOM TOOTH EXTRACTION     age 62   Patient Active Problem List   Diagnosis Date Noted   Osteoarthritis of left knee 12/14/2020   Subacromial bursitis of right shoulder joint 07/15/2020   Cervical radiculopathy 06/24/2020   Effusion, left knee 06/24/2019   Pes anserine bursitis 04/29/2019   Bucket handle tear of lateral meniscus of left knee 12/12/2018   Ganglion cyst 12/12/2018   Fibroids 03/18/2018   S/P parathyroidectomy ([place]) 02/12/2018   Primary hyperparathyroidism ([place]) 01/08/2018   Hyperparathyroidism , secondary, non-renal ([place]) 01/08/2018   Fibroid uterus 12/06/2017   Hypercalcemia 12/06/2017   Fibroid 10/15/2017   Abnormal uterine bleeding (AUB) 08/20/2017   Anemia 08/20/2017    REFERRING DIAG: 54.50,G89.29 (ICD-10-CM) - Chronic right-sided low back pain without sciatica M54.6 (ICD-10-CM) - Acute right-sided thoracic back pain M79.18 (ICD-10-CM) - Myofascial pain syndrome   THERAPY DIAG: myofascial back pain   Rationale for Evaluation and Treatment Rehabilitation  PERTINENT HISTORY: I: Katie Benson is a 41 y.o. female who comes in today for evaluation of acute on chronic right sided lower back pain radiating to buttock and up to right thoracic back. Patient reports right sided back issues for many years which she contributes to issues with left  knee and poor posture. Her pain increased significantly approximately 1 week ago after she stood up from couch. Patient states her pain is exacerbated by movement and activity, describes her pain as sore, aching and tight, currently rates as 7 out of 10. Patient reports some relief of pain with home exercise regimen, rest and use of medications. Patient states she is currently taking Gabapentin and using Flexeril at bedtime. Patient did attend 1 session of formal physical therapy with our in house team  back in May, she has not returned since. Patient states there was an issues with her financial assistance but hopes to have this worked out soon. Patients recent lumbar MRI imaging exhibits mild degenerative changes of the lumbar spine, no spinal canal stenosis, no nerve impingement. Patient states her pain is negatively impacting her daily life. Patient has history of chronic left knee issues, she does have advanced osteoarthritis to left knee, previously had left knee arthroscopy with lateral menisectomy in 2020 by Dr. Joni Fears. We have previously seen patient in our office for chronic right sided neck pain with radicular symptoms, she is scheduled for cervical epidural steroid injection with [place] on 09/13/2021. Patient denies focal weakness, numbness and tingling. Patient denies recent trauma or falls.    PRECAUTIONS: None  SUBJECTIVE: Patient arrives with c/o elevated pain levels.  Feels symptoms are related to stress and recently exacerbated and now reporting leg and arm pain.  Requests DC to HEP as exercises cause too much lasting pain.  Pain rated at 30/10 today  PAIN:  Are you having pain? Yes: NPRS scale: "25"/10 Pain location: R low back Pain description: ache Aggravating factors: activity Relieving factors: undetermined   OBJECTIVE: (objective measures completed at initial evaluation unless otherwise dated)   DIAGNOSTIC FINDINGS:  EXAM: MRI LUMBAR SPINE WITHOUT CONTRAST   TECHNIQUE: Multiplanar, multisequence MR imaging of the lumbar spine was performed. No intravenous contrast was administered.   COMPARISON:  Lumbar spine x-rays dated June 23, 2021. CT abdomen pelvis dated March 26, 2020.   FINDINGS: Segmentation:  Standard.   Alignment:  Physiologic.   Vertebrae:  No fracture, evidence of discitis, or bone lesion.   Conus medullaris and cauda equina: Conus extends to the L1-L2 level. Conus and cauda equina appear normal.   Paraspinal and other soft tissues:  Negative.   Disc levels:   T11-T12 to L2-L3: Negative.   L3-L4: Mild disc bulging and bilateral facet arthropathy. Mild bilateral neuroforaminal stenosis. No spinal canal stenosis.   L4-L5: Negative disc. Mild bilateral facet arthropathy. Mild bilateral neuroforaminal stenosis. No spinal canal stenosis.   L5-S1: Negative disc. Mild bilateral facet arthropathy. No stenosis.   IMPRESSION: 1. Mild degenerative changes of the lower lumbar spine as described above. No high-grade stenosis or impingement.     Electronically Signed   By: Titus Dubin M.D.   On: 08/19/2021 16:36   PATIENT SURVEYS:  Modified Oswestry 32/50    SCREENING FOR RED FLAGS: Bowel or bladder incontinence: No   COGNITION:           Overall cognitive status: Within functional limits for tasks assessed                          SENSATION: Not tested   MUSCLE LENGTH: Hamstrings: Right 30 deg; Left 30 deg, (observed to long sit w/o difficulty in eval despite 30d SLR B) Thomas test: UTA due to pain   POSTURE: No Significant postural limitations   PALPATION:  Exquisite superficial tenderness to R lumbar paraspinals, R gluteals and R piriformis regions.    LUMBAR ROM:    Active  A/PROM  eval  Flexion 50%  Extension 0%  Right lateral flexion 50%  Left lateral flexion 50%  Right rotation 90%  Left rotation 50%   (Blank rows = not tested)   LOWER EXTREMITY ROM:   Observed to be [facility] when distracted    Active  Right eval Left eval  Hip flexion      Hip extension      Hip abduction      Hip adduction      Hip internal rotation      Hip external rotation      Knee flexion      Knee extension      Ankle dorsiflexion      Ankle plantarflexion      Ankle inversion      Ankle eversion       (Blank rows = not tested)   LOWER EXTREMITY MMT:  UTA accurately due to pain levels   MMT Right eval Left eval  Hip flexion      Hip extension      Hip abduction      Hip adduction      Hip internal  rotation      Hip external rotation      Knee flexion      Knee extension      Ankle dorsiflexion      Ankle plantarflexion      Ankle inversion      Ankle eversion       (Blank rows = not tested)   LUMBAR SPECIAL TESTS:  Straight leg raise test: Negative, Slump test: Negative, FABER test: UTA due to pain, and Thomas test: UTA due to pain   FUNCTIONAL TESTS:  30 seconds chair stand test 2 stands    GAIT: Distance walked: 35f x2 Assistive device utilized: None Level of assistance: Complete Independence Comments: guarded gait        TODAY'S TREATMENT  [facility] Adult PT Treatment:                                                DATE: 10/12/21 Therapeutic Exercise: Seated hamstring stretch B 30s x2 SKTC B 30s x2  Open book 10x B  Supine march, alt. 15/15  [facility] Adult PT Treatment:                                                DATE: 10/05/21 Therapeutic Exercise: Nustep L1 6 min Seated hamstring stretch B 30s x2 SKTC B 30s x2  Open book 10x B  Supine march, alt. 15/15     PATIENT EDUCATION:  Education details: Discussed eval findings, rehab rationale and POC and patient is in agreement  Person educated: Patient Education method: Explanation Education comprehension: verbalized understanding and needs further education     HOME EXERCISE PROGRAM: Access Code: [email or web address]: [email or web address] Date: 09/28/2021 Prepared by: JSharlynn Oliphant  Exercises - Sit to Stand with Arms Crossed  - 2 x daily - 7 x weekly - 1 sets - 5 reps   ASSESSMENT:   CLINICAL IMPRESSION: Continued unrelenting myofascial pain, patient  requests HEP update to perform at home.  Goals not met as  of today.  One more session scheduled.      OBJECTIVE IMPAIRMENTS decreased activity tolerance, decreased knowledge of condition, decreased mobility, difficulty walking, decreased ROM, impaired perceived functional ability, increased muscle spasms, impaired flexibility, and pain.    ACTIVITY  LIMITATIONS carrying, lifting, bending, sitting, standing, squatting, sleeping, stairs, and bed mobility   PARTICIPATION LIMITATIONS: meal prep, cleaning, medication management, driving, community activity, and occupation   PERSONAL FACTORS Age, Behavior pattern, and Time since onset of injury/illness/exacerbation are also affecting patient's functional outcome.    REHAB POTENTIAL: Fair based on chronicity of symptoms  as well as undermined nature of pain and poor tolerance to prolonged positionin.   CLINICAL DECISION MAKING: Evolving/moderate complexity   EVALUATION COMPLEXITY: Moderate     GOALS: Goals reviewed with patient? Yes   SHORT TERM GOALS: Target date: 10/26/2021   Patient to demonstrate tolerance to a HEP focused on self stretching Baseline:[id number] Goal status: Met   2.  Decrease pain to 6/10 at best Baseline: 7/10 at best today; 10/12/21 30/10 Goal status: Not met   3.  Increase AROM lumbar to 75% throughout Baseline:  Active  A/PROM  eval  Flexion 50%  Extension 0%  Right lateral flexion 50%  Left lateral flexion 50%  Right rotation 90%  Left rotation 50%    Goal status: Not met   4.  Decrease ODI score to 25/50 Baseline: 32/50; 10/12/21 28/30 Goal status: INITIAL         PLAN: PT FREQUENCY: 1x/week   PT DURATION: 4 weeks   PLANNED INTERVENTIONS: Therapeutic exercises, Therapeutic activity, Neuromuscular re-education, Balance training, Gait training, Patient/Family education, Self Care, Joint mobilization, Dry Needling, Spinal mobilization, Manual therapy, and Re-evaluation.   PLAN FOR NEXT SESSION: [place]     Lanice Shirts, PT 10/12/2021, 1:57 PM

## 2021-10-24 ENCOUNTER — Other Ambulatory Visit: Payer: Self-pay

## 2021-10-28 ENCOUNTER — Other Ambulatory Visit: Payer: Self-pay

## 2021-11-03 ENCOUNTER — Encounter: Payer: Self-pay | Admitting: Surgical

## 2021-11-03 ENCOUNTER — Ambulatory Visit (INDEPENDENT_AMBULATORY_CARE_PROVIDER_SITE_OTHER): Payer: Self-pay | Admitting: Surgical

## 2021-11-03 ENCOUNTER — Other Ambulatory Visit: Payer: Self-pay

## 2021-11-03 ENCOUNTER — Telehealth: Payer: Self-pay

## 2021-11-03 DIAGNOSIS — M533 Sacrococcygeal disorders, not elsewhere classified: Secondary | ICD-10-CM

## 2021-11-03 DIAGNOSIS — M1712 Unilateral primary osteoarthritis, left knee: Secondary | ICD-10-CM

## 2021-11-03 MED ORDER — LIDOCAINE HCL 1 % IJ SOLN
5.0000 mL | INTRAMUSCULAR | Status: AC | PRN
  Administered 2021-11-03: 5 mL

## 2021-11-03 MED ORDER — METHYLPREDNISOLONE ACETATE 40 MG/ML IJ SUSP
40.0000 mg | INTRAMUSCULAR | Status: AC | PRN
  Administered 2021-11-03: 40 mg via INTRA_ARTICULAR

## 2021-11-03 MED ORDER — BUPIVACAINE HCL 0.25 % IJ SOLN
4.0000 mL | INTRAMUSCULAR | Status: AC | PRN
  Administered 2021-11-03: 4 mL via INTRA_ARTICULAR

## 2021-11-03 NOTE — Progress Notes (Signed)
Office Visit Note   Patient: Katie Benson           Date of Birth: 07-13-1980           MRN: [id number] Visit Date: 11/03/2021 Requested by: Ladell Pier, MD [place],  [place] [address] PCP: Ladell Pier, MD  Subjective: Chief Complaint  Patient presents with   Left Knee - Pain    HPI: Miosotis Wetsel is a 41 y.o. female who presents to the office complaining of left knee pain and low back pain on the right-hand side.  Patient has history of left knee arthritis.  She returns to repeat injection that has been doing well for her.  Pain does not wake her up at night in the knee.  Not really bothering her enough for any surgical intervention at this time.  She does have reduced amount of fluid in the knee compared with the past according to her.  Additionally, she complains of right-sided low back pain with occasional radiation down into the right buttock and posterior mid thigh.  Mostly just pain and not much numbness or tingling.  No weakness of the leg.  No red flag symptoms..                ROS: All systems reviewed are negative as they relate to the chief complaint within the history of present illness.  Patient denies fevers or chills.  Assessment & Plan: Visit Diagnoses:  1. Unilateral primary osteoarthritis, left knee   2. Sacroiliac dysfunction     Plan: Patient is a 41 year old female who presents for evaluation of left knee pain and right-sided low back pain.  She has history of left knee arthritis and would like to repeat injection.  This was successfully administered and patient tolerated the procedure well.  After left knee injection, we discussed her right-sided low back pain.  She has MRI of her lumbar spine that demonstrates no significant foraminal or spinal stenosis to explain her back pain.  Today, she is localizing most of her pain to the right SI joint.  Not really much degenerative changes of the SI joint on previous  radiographs but could definitely be symptomatic for her given her exam today that is suggestive of SI joint pathology.  After discussion of options for further evaluation of low back pain, she would like to try ultrasound-guided right sacroiliac joint injection.  Plan to set her up to see Dr. Elba Barman to try this injection.  If no improvement after this, will consider diagnostic epidural steroid injection in the lumbar spine with Dr. Ernestina Patches.  We also remeasured the lipoma on her left shoulder blade today which measured 5 x 5 cm which is roughly equivalent to the last measurement.  Follow-Up Instructions: No follow-ups on file.   Orders:  No orders of the defined types were placed in this encounter.  No orders of the defined types were placed in this encounter.     Procedures: Large Joint Inj: L knee on 11/03/2021 9:42 AM Indications: diagnostic evaluation, joint swelling and pain Details: 18 G 1.5 in needle, superolateral approach  Arthrogram: No  Medications: 5 mL lidocaine 1 %; 40 mg methylPREDNISolone acetate 40 MG/ML; 4 mL bupivacaine 0.25 % Outcome: tolerated well, no immediate complications Procedure, treatment alternatives, risks and benefits explained, specific risks discussed. Consent was given by the patient. Immediately prior to procedure a time out was called to verify the correct patient, procedure, equipment, support  staff and site/side marked as required. Patient was prepped and draped in the usual sterile fashion.       Clinical Data: No additional findings.  Objective: Vital Signs: There were no vitals taken for this visit.  Physical Exam:  Constitutional: Patient appears well-developed HEENT:  Head: Normocephalic Eyes:EOM are normal Neck: Normal range of motion Cardiovascular: Normal rate Pulmonary/chest: Effort normal Neurologic: Patient is alert Skin: Skin is warm Psychiatric: Patient has normal mood and affect  Ortho Exam: Ortho exam demonstrates left  knee without effusion.  Tenderness over the lateral joint line and medial joint line.  Able to perform straight leg raise.  Ambulates with mild antalgia.  She has no pain with hip range of motion on either side.  She has no tenderness over either trochanter.  Positive FABER test on the right, negative on the left.  She has tenderness in the sacral sulcus on the right-hand side but not the left.  This is where most of her pain localizes rather than in the axial lumbar spine where she does not really have much in the way of tenderness.  Specialty Comments:  MRI CERVICAL SPINE WITHOUT CONTRAST   TECHNIQUE: Multiplanar, multisequence MR imaging of the cervical spine was performed. No intravenous contrast was administered.   COMPARISON:  CT scan 12/26/2012   FINDINGS: Alignment: No vertebral subluxation is observed.   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 Disc desiccation at all levels between C2 and C7.   Cord: No significant abnormal spinal cord signal is observed.   Posterior Fossa, vertebral arteries, paraspinal tissues: Unremarkable   Disc levels:   C2-3: Unremarkable.   C3-4: Borderline bilateral foraminal stenosis due to disc bulge and uncinate spurring.   C4-5: Moderate to prominent bilateral foraminal stenosis and borderline central narrowing of the thecal sac due to disc bulge and uncinate spurring.   C5-6: Moderate right and moderate to prominent left foraminal stenosis and borderline central narrowing of the thecal sac due to disc bulge and uncinate spurring.   C6-7: Mild right and moderate left foraminal stenosis due to disc bulge and uncinate spurring.   C7-T1: No impingement.  Minimal uncinate spurring.   T1-2: Unremarkable.   IMPRESSION: 1. Cervical spondylosis, degenerative disc disease, and mildly congenitally short pedicles contribute to moderate to prominent impingement at C4-5; moderate impingement at C5-6; and mild impingement  at C6-7, as detailed above.     Electronically Signed   By: Van Clines M.D.   On: 08/01/2020 09:25  ---------------------------------------------- EXAM: MRI LUMBAR SPINE WITHOUT CONTRAST   TECHNIQUE: Multiplanar, multisequence MR imaging of the lumbar spine was performed. No intravenous contrast was administered.   COMPARISON:  Lumbar spine x-rays dated June 23, 2021. CT abdomen pelvis dated March 26, 2020.   FINDINGS: Segmentation:  Standard.   Alignment:  Physiologic.   Vertebrae:  No fracture, evidence of discitis, or bone lesion.   Conus medullaris and cauda equina: Conus extends to the L1-L2 level. Conus and cauda equina appear normal.   Paraspinal and other soft tissues: Negative.   Disc levels:   T11-T12 to L2-L3: Negative.   L3-L4: Mild disc bulging and bilateral facet arthropathy. Mild bilateral neuroforaminal stenosis. No spinal canal stenosis.   L4-L5: Negative disc. Mild bilateral facet arthropathy. Mild bilateral neuroforaminal stenosis. No spinal canal stenosis.   L5-S1: Negative disc. Mild bilateral facet arthropathy. No stenosis.   IMPRESSION: 1. Mild degenerative changes of the lower lumbar spine as described above. No high-grade stenosis or impingement.  Electronically Signed   By: Titus Dubin M.D.   On: 08/19/2021 16:36  Imaging: No results found.   PMFS History: Patient Active Problem List   Diagnosis Date Noted   Osteoarthritis of left knee 12/14/2020   Subacromial bursitis of right shoulder joint 07/15/2020   Cervical radiculopathy 06/24/2020   Effusion, left knee 06/24/2019   Pes anserine bursitis 04/29/2019   Bucket handle tear of lateral meniscus of left knee 12/12/2018   Ganglion cyst 12/12/2018   Fibroids 03/18/2018   S/P parathyroidectomy ([place]) 02/12/2018   Primary hyperparathyroidism ([place]) 01/08/2018   Hyperparathyroidism , secondary, non-renal ([place]) 01/08/2018   Fibroid uterus 12/06/2017    Hypercalcemia 12/06/2017   Fibroid 10/15/2017   Abnormal uterine bleeding (AUB) 08/20/2017   Anemia 08/20/2017   Past Medical History:  Diagnosis Date   Anemia    Arthritis    Fibroids    GERD (gastroesophageal reflux disease)    History of kidney stones    Neck injury     Family History  Problem Relation Age of Onset   Diabetes Sister    Cancer Maternal Grandmother        ovarian   Hypercalcemia Mother    Other Father        HX Unknown    Past Surgical History:  Procedure Laterality Date   CESAREAN SECTION     IR ANGIOGRAM PELVIS SELECTIVE OR SUPRASELECTIVE  03/18/2018   IR ANGIOGRAM PELVIS SELECTIVE OR SUPRASELECTIVE  03/18/2018   IR ANGIOGRAM SELECTIVE EACH ADDITIONAL VESSEL  03/18/2018   IR ANGIOGRAM SELECTIVE EACH ADDITIONAL VESSEL  03/18/2018   IR EMBO TUMOR ORGAN ISCHEMIA INFARCT INC GUIDE ROADMAPPING  03/18/2018   IR RADIOLOGIST EVAL & MGMT  10/17/2017   IR RADIOLOGIST EVAL & MGMT  12/20/2017   IR RADIOLOGIST EVAL & MGMT  04/17/2018   IR US GUIDE VASC ACCESS RIGHT  03/18/2018   KNEE ARTHROSCOPY WITH LATERAL MENISECTOMY Left 12/24/2018   Procedure: LEFT KNEE ARTHROSCOPY WITH LATERAL MENISECTOM, debriment of GANGLION CYST;  Surgeon: [name], MD;  Location: [facility];  Service: Orthopedics;  Laterality: Left;   PARATHYROIDECTOMY N/A 02/12/2018   Procedure: PARATHYROIDECTOMY;  Surgeon: Fredirick Maudlin, MD;  Location: [facility];  Service: General;  Laterality: N/A;   WISDOM TOOTH EXTRACTION     age 85   Social History   Occupational History   Occupation: unemployed  Tobacco Use   Smoking status: Some Days    Packs/day: 0.25    Years: 14.00    Total pack years: 3.50    Types: Cigarettes   Smokeless tobacco: Never  Vaping Use   Vaping Use: Never used  Substance and Sexual Activity   Alcohol use: Yes    Comment: occassionally on weekends   Drug use: Yes    Types: Marijuana    Comment: multiple times a day   Sexual activity: Yes    Partners: Male    Birth  control/protection: Condom

## 2021-11-03 NOTE — Telephone Encounter (Signed)
Lurena Joiner would like for patient to have right SI joint injection with you. Ok to schedule?

## 2021-11-03 NOTE — Telephone Encounter (Signed)
scheduled

## 2021-11-04 ENCOUNTER — Other Ambulatory Visit: Payer: Self-pay

## 2021-11-04 ENCOUNTER — Telehealth: Payer: Self-pay | Admitting: Orthopedic Surgery

## 2021-11-04 MED ORDER — LIDOPURE PATCH 5 % EX KIT
5.0000 | PACK | Freq: Every day | CUTANEOUS | 1 refills | Status: DC | PRN
  Filled 2021-11-04 – 2021-11-29 (×2): qty 30, fill #0

## 2021-11-04 NOTE — Telephone Encounter (Signed)
Audwin  (tech  called from [employer] requesting refill of lidocaine patches. Please call  Audwin '@[phone]'$ 

## 2021-11-04 NOTE — Telephone Encounter (Signed)
Thank you :)

## 2021-11-04 NOTE — Telephone Encounter (Signed)
Rxrf for this submitted

## 2021-11-08 ENCOUNTER — Encounter: Payer: Self-pay | Admitting: Sports Medicine

## 2021-11-08 ENCOUNTER — Ambulatory Visit: Payer: Self-pay

## 2021-11-08 ENCOUNTER — Ambulatory Visit (INDEPENDENT_AMBULATORY_CARE_PROVIDER_SITE_OTHER): Payer: Self-pay | Admitting: Sports Medicine

## 2021-11-08 VITALS — BP 118/81 | HR 101

## 2021-11-08 DIAGNOSIS — G8929 Other chronic pain: Secondary | ICD-10-CM

## 2021-11-08 DIAGNOSIS — M533 Sacrococcygeal disorders, not elsewhere classified: Secondary | ICD-10-CM

## 2021-11-08 NOTE — Progress Notes (Signed)
   Procedure Note  Patient: Katie Benson             Date of Birth: 1980/09/24           MRN: [id number]             Visit Date: 11/08/2021  Procedures: Visit Diagnoses:  1. Chronic right SI joint pain    U/S-guided SI-joint injection, right  Procedure: After discussion of risk/benefits/indications, informed verbal consent was obtained. Then a timeout was performed. The patient was positioned in a prone position on exam room table with a pillow placed under the pelvis for mild hip flexion. The SI joint area was cleaned and prepped with betadine and alcohol swabs. Sterile ultrasound gel was applied and the ultrasound transducer was placed in an anatomic axial plane over the PSIS, then moved distally over the SI-joint. Using ultrasound guidance, a 22-gauge, 3.5" needle was inserted from a medial to lateral approach utilizing an in-plane approach and directed into the SI-joint. The SI-joint was then injected with a mixture of 4:2 lidocaine:depomedrol with visualization of the injectate flow into the SI-joint under ultrasound visualization. The patient tolerated the procedure well without immediate complications.  -Patient was evaluated about 10 minutes postinjection and had significant improvement in her pain following this -She will follow-up with Lurena Joiner and Dr. Marlou Sa and team as indicated; I am happy to see her back as needed  Elba Barman, DO [place]  This note was dictated using Dragon naturally speaking software and may contain errors in syntax, spelling, or content which have not been identified prior to signing this note.

## 2021-11-09 ENCOUNTER — Encounter: Payer: Self-pay | Admitting: Gastroenterology

## 2021-11-21 ENCOUNTER — Other Ambulatory Visit: Payer: Self-pay | Admitting: Internal Medicine

## 2021-11-21 ENCOUNTER — Other Ambulatory Visit: Payer: Self-pay

## 2021-11-22 NOTE — Telephone Encounter (Signed)
Requested medications are due for refill today.  unsure  Requested medications are on the active medications list.  yes  Last refill. 10/10/2021 #90 0 rf  Future visit scheduled.   yes  Notes to clinic.  Refill not delegated.  Rx signed by Barnet Pall.    Requested Prescriptions  Pending Prescriptions Disp Refills   methocarbamol (ROBAXIN) 500 MG tablet 90 tablet 0    Sig: Take 1 tablet (500 mg total) by mouth 3 (three) times daily.     Not Delegated - Analgesics:  Muscle Relaxants Failed - 11/22/2021 11:50 AM      Failed - This refill cannot be delegated      Passed - Valid encounter within last 6 months    Recent Outpatient Visits           1 month ago Chronic bilateral low back pain without sciatica   [place], Vernia Buff, NP   4 months ago Poor appetite   [place], Marybelle M, [place]   11 months ago Osteoarthritis of left knee, unspecified osteoarthritis type   [place], Deborah B, MD   1 year ago Encounter to establish care   [place], [place]   2 years ago Chronic pain of left knee   [facility], Bayard Beaver, MD       Future Appointments             In 3 weeks Girtha Rm, NP-[facility] [place] [facility] [place]   In [place], MD [place]

## 2021-11-25 ENCOUNTER — Other Ambulatory Visit: Payer: Self-pay

## 2021-11-27 ENCOUNTER — Other Ambulatory Visit: Payer: Self-pay | Admitting: Surgical

## 2021-11-27 ENCOUNTER — Other Ambulatory Visit: Payer: Self-pay | Admitting: Gastroenterology

## 2021-11-28 ENCOUNTER — Other Ambulatory Visit: Payer: Self-pay

## 2021-11-28 MED ORDER — GABAPENTIN 100 MG PO CAPS
100.0000 mg | ORAL_CAPSULE | Freq: Three times a day (TID) | ORAL | 1 refills | Status: DC
  Filled 2021-11-28 – 2021-12-02 (×2): qty 90, 30d supply, fill #0
  Filled 2022-01-03 – 2022-01-13 (×2): qty 90, 30d supply, fill #1

## 2021-11-29 ENCOUNTER — Other Ambulatory Visit: Payer: Self-pay | Admitting: Surgical

## 2021-11-29 ENCOUNTER — Other Ambulatory Visit: Payer: Self-pay | Admitting: Gastroenterology

## 2021-11-29 ENCOUNTER — Other Ambulatory Visit: Payer: Self-pay

## 2021-11-29 ENCOUNTER — Other Ambulatory Visit: Payer: Self-pay | Admitting: Physical Medicine and Rehabilitation

## 2021-11-29 MED ORDER — METHOCARBAMOL 500 MG PO TABS
500.0000 mg | ORAL_TABLET | Freq: Three times a day (TID) | ORAL | 0 refills | Status: DC
  Filled 2021-11-29: qty 90, 30d supply, fill #0

## 2021-11-29 MED ORDER — LIDOCAINE 5 % EX PTCH
2.0000 | MEDICATED_PATCH | Freq: Every day | CUTANEOUS | 1 refills | Status: DC | PRN
  Filled 2021-11-29: qty 30, fill #0
  Filled 2021-12-05: qty 30, 15d supply, fill #0
  Filled 2022-01-13: qty 30, 15d supply, fill #1

## 2021-12-02 ENCOUNTER — Encounter: Payer: Self-pay | Admitting: Gastroenterology

## 2021-12-02 ENCOUNTER — Other Ambulatory Visit: Payer: Self-pay

## 2021-12-05 ENCOUNTER — Other Ambulatory Visit: Payer: Self-pay

## 2021-12-06 ENCOUNTER — Other Ambulatory Visit: Payer: Self-pay

## 2021-12-10 ENCOUNTER — Encounter (HOSPITAL_COMMUNITY): Payer: Self-pay

## 2021-12-10 ENCOUNTER — Ambulatory Visit (HOSPITAL_COMMUNITY)
Admission: EM | Admit: 2021-12-10 | Discharge: 2021-12-10 | Disposition: A | Discharge status: Home / Self Care | Payer: No Typology Code available for payment source | Attending: Internal Medicine | Admitting: Internal Medicine

## 2021-12-10 DIAGNOSIS — S0502XA Injury of conjunctiva and corneal abrasion without foreign body, left eye, initial encounter: Secondary | ICD-10-CM

## 2021-12-10 MED ORDER — TETRACAINE HCL 0.5 % OP SOLN
OPHTHALMIC | Status: AC
  Filled 2021-12-10: qty 4

## 2021-12-10 MED ORDER — ERYTHROMYCIN 5 MG/GM OP OINT: TOPICAL_OINTMENT | OPHTHALMIC | 0 refills | Status: DC

## 2021-12-10 MED ORDER — FLUORESCEIN SODIUM 1 MG OP STRP
ORAL_STRIP | OPHTHALMIC | Status: AC
  Filled 2021-12-10: qty 1

## 2021-12-10 NOTE — ED Provider Notes (Signed)
[facility]    CSN: [id number] Arrival date & time: 12/10/21  1400      History   Chief Complaint Chief Complaint  Patient presents with   Eye Pain    HPI Katie Benson is a 41 y.o. female.   Patient presents urgent care for evaluation of left eye pain that started 5 days ago.  Patient states that she recently stopped using her high Cosamin medication that she uses for irritable bowel syndrome since she had been on it for the past 4-1/2 to 5 months.  She states she began to experience side effects from the medication after long-term use like eye dryness and dry mouth so she attempted to come off of it by taking it once per day rather than twice per day for the last 3 to 4 days.  She states that her eye pain and dryness has worsened over the last 5 days causing her to frequently scratch her eye and attempt to pull eyelashes from her eye and attempt to relieve the dryness and the pain.  She believes that she may have scratched her eye in the process and now she is experiencing yellow/green drainage from the left eye and states that her "eyes stuck together" when she wakes up in the morning.  She is reporting mild blurry vision to the left eye and pain to the left eye with upward movement.  She has attempted use of over-the-counter eyedrops without much relief of symptoms prior to arrival urgent care.  No known sick contacts with pinkeye.  She does not wear contacts or glasses for vision correction.  Pain to the eye has progressed into a slight generalized headache due to squinting and straining of the eyes to be able to see appropriately with the left eye.  Denies redness and swelling to the surrounding areas of the eye.  Eye pain is localized to the eyeball itself, not behind the eye.   Eye Pain    Past Medical History:  Diagnosis Date   Anemia    Arthritis    Fibroids    GERD (gastroesophageal reflux disease)    History of kidney stones    Neck injury      Patient Active Problem List   Diagnosis Date Noted   Osteoarthritis of left knee 12/14/2020   Subacromial bursitis of right shoulder joint 07/15/2020   Cervical radiculopathy 06/24/2020   Effusion, left knee 06/24/2019   Pes anserine bursitis 04/29/2019   Bucket handle tear of lateral meniscus of left knee 12/12/2018   Ganglion cyst 12/12/2018   Fibroids 03/18/2018   S/P parathyroidectomy ([place]) 02/12/2018   Primary hyperparathyroidism ([place]) 01/08/2018   Hyperparathyroidism , secondary, non-renal ([place]) 01/08/2018   Fibroid uterus 12/06/2017   Hypercalcemia 12/06/2017   Fibroid 10/15/2017   Abnormal uterine bleeding (AUB) 08/20/2017   Anemia 08/20/2017    Past Surgical History:  Procedure Laterality Date   CESAREAN SECTION     IR ANGIOGRAM PELVIS SELECTIVE OR SUPRASELECTIVE  03/18/2018   IR ANGIOGRAM PELVIS SELECTIVE OR SUPRASELECTIVE  03/18/2018   IR ANGIOGRAM SELECTIVE EACH ADDITIONAL VESSEL  03/18/2018   IR ANGIOGRAM SELECTIVE EACH ADDITIONAL VESSEL  03/18/2018   IR EMBO TUMOR ORGAN ISCHEMIA INFARCT INC GUIDE ROADMAPPING  03/18/2018   IR RADIOLOGIST EVAL & MGMT  10/17/2017   IR RADIOLOGIST EVAL & MGMT  12/20/2017   IR RADIOLOGIST EVAL & MGMT  04/17/2018   IR US GUIDE VASC ACCESS RIGHT  03/18/2018   KNEE ARTHROSCOPY WITH LATERAL MENISECTOMY  Left 12/24/2018   Procedure: LEFT KNEE ARTHROSCOPY WITH LATERAL MENISECTOM, debriment of GANGLION CYST;  Surgeon: [name], MD;  Location: [facility];  Service: Orthopedics;  Laterality: Left;   PARATHYROIDECTOMY N/A 02/12/2018   Procedure: PARATHYROIDECTOMY;  Surgeon: Fredirick Maudlin, MD;  Location: [facility];  Service: General;  Laterality: N/A;   WISDOM TOOTH EXTRACTION     age 69    OB History     Gravida  2   Para  1   Term  1   Preterm  0   AB  1   Living  1      SAB  1   IAB  0   Ectopic  0   Multiple  0   Live Births  1            Home Medications    Prior to Admission medications   Medication  Sig Start Date End Date Taking? Authorizing Provider  erythromycin ophthalmic ointment Place a 1/2 inch ribbon of ointment into the lower eyelid. 12/10/21  Yes Naomi Fitton, Stasia Cavalier, FNP  AMBULATORY NON FORMULARY MEDICATION Medication Name:  Nitroglycerin ointment 0.125% three times daily use pea sized amount per rectum for 6-8 weeks 12/15/20   Mauri Pole, MD  cyclobenzaprine (FLEXERIL) 10 MG tablet Take 1 tablet (10 mg total) by mouth 2 (two) times daily. 09/19/21   Magnant, Charles L, PA-C  desipramine (NORPRAMIN) 25 MG tablet Take 1 tablet (25 mg total) by mouth at bedtime. 08/25/21   Lucilla Lame, MD  desipramine (NORPRAMIN) 25 MG tablet take 1 tab by mouth at bedtime 08/25/21     dicyclomine (BENTYL) 20 MG tablet TAKE 1 TABLET BY MOUTH EVERY 6 HOURS AS NEEDED FOR SPASMS 12/15/20   Nandigam, Venia Minks, MD  gabapentin (NEURONTIN) 100 MG capsule Take 1 capsule (100 mg total) by mouth 3 (three) times daily. 11/28/21   Magnant, Gerrianne Scale, PA-C  hydrocortisone (ANUSOL-HC) 2.5 % rectal cream Place 1 application. rectally 2 (two) times daily as needed for hemorrhoids or anal itching. 06/15/21   Mauri Pole, MD  hyoscyamine (LEVBID) 0.375 MG 12 hr tablet Take 1 tablet (0.375 mg total) by mouth 2 (two) times daily. 08/25/21   Lucilla Lame, MD  hyoscyamine (LEVBID) 0.375 MG 12 hr tablet Take 1 tabley by mouth twice daily 08/25/21     Lidocaine, Anorectal, (RECTICARE) 5 % CREA Apply 1 Application topically every 4 (four) hours as needed. 08/25/21   Lucilla Lame, MD  lidocaine (LIDODERM) 5 % Apply 2 patches topically daily as needed. Leave patch on for maximum duration of 12 hours.  Do not place 2 patches on one area within 24 hours 11/29/21   Magnant, Gerrianne Scale, PA-C  meloxicam (MOBIC) 15 MG tablet Take 1 tablet (15 mg total) by mouth daily. Patient not taking: Reported on 10/10/2021 12/14/20   Ladell Pier, MD  methocarbamol (ROBAXIN) 500 MG tablet Take 1 tablet (500 mg total) by mouth 3  (three) times daily. 11/29/21   Lorine Bears, NP  pantoprazole (PROTONIX) 40 MG tablet Take 1 tablet (40 mg total) by mouth daily. 12/15/20   Mauri Pole, MD  polyethylene glycol (MIRALAX / GLYCOLAX) 17 g packet Take 17 g by mouth daily. Patient not taking: Reported on 10/10/2021    [provider]    Family History Family History  Problem Relation Age of Onset   Diabetes Sister    Cancer Maternal Grandmother  ovarian   Hypercalcemia Mother    Other Father        HX Unknown    Social History Social History   Tobacco Use   Smoking status: Some Days    Packs/day: 0.25    Years: 14.00    Total pack years: 3.50    Types: Cigarettes   Smokeless tobacco: Never  Vaping Use   Vaping Use: Never used  Substance Use Topics   Alcohol use: Yes    Comment: occassionally on weekends   Drug use: Yes    Types: Marijuana    Comment: multiple times a day     Allergies   Pork-derived products   Review of Systems Review of Systems  Eyes:  Positive for pain.  Per HPI   Physical Exam Triage Vital Signs ED Triage Vitals [12/10/21 1427]  Enc Vitals Group     BP 126/82     Pulse Rate (!) 103     Resp 16     Temp 98.4 F (36.9 C)     Temp Source Oral     SpO2 98 %     Weight      Height      Head Circumference      Peak Flow      Pain Score      Pain Loc      Pain Edu?      Excl. in [place]?    No data found.  Updated Vital Signs BP 126/82 (BP Location: Left Arm)   Pulse (!) 103   Temp 98.4 F (36.9 C) (Oral)   Resp 16   SpO2 98%   Visual Acuity Right Eye Distance:   Left Eye Distance:   Bilateral Distance:    Right Eye Near:   Left Eye Near:    Bilateral Near:     Physical Exam Vitals and nursing note reviewed.  Constitutional:      Appearance: She is not ill-appearing or toxic-appearing.  HENT:     Head: Normocephalic and atraumatic.     Right Ear: Hearing and external ear normal.     Left Ear: Hearing and external ear normal.      Nose: Nose normal.     Mouth/Throat:     Lips: Pink.  Eyes:     General: Lids are normal. Vision grossly intact. Gaze aligned appropriately. No visual field deficit.    Extraocular Movements: Extraocular movements intact.     Conjunctiva/sclera:     Right eye: Right conjunctiva is not injected. No chemosis, exudate or hemorrhage.    Left eye: Left conjunctiva is injected. Exudate present. No chemosis.    Pupils:     Left eye: Corneal abrasion and fluorescein uptake present.     Comments: EOMs are intact, although there is pain to the left eye with eye movement upward into the left.  Green/yellow exudate present on assessment of the left eye.  Visual fields intact.  No preseptal erythema or swelling present.  Fluorescein uptake to the 6 o'clock position over the left pupil with fluorescein staining exam.  Patient reports improvement in symptoms with tetracaine numbing.  Pulmonary:     Effort: Pulmonary effort is normal.  Musculoskeletal:     Cervical back: Neck supple.  Skin:    General: Skin is warm and dry.     Capillary Refill: Capillary refill takes less than 2 seconds.     Findings: No rash.  Neurological:     General: No focal deficit present.  Mental Status: She is alert and oriented to person, place, and time. Mental status is at baseline.     Cranial Nerves: No dysarthria or facial asymmetry.  Psychiatric:        Mood and Affect: Mood normal.        Speech: Speech normal.        Behavior: Behavior normal.        Thought Content: Thought content normal.        Judgment: Judgment normal.      UC Treatments / Results  Labs (all labs ordered are listed, but only abnormal results are displayed) Labs Reviewed - No data to display  EKG   Radiology No results found.  Procedures Procedures (including critical care time)  Medications Ordered in UC Medications - No data to display  Initial Impression / Assessment and Plan / UC Course  I have reviewed the  triage vital signs and the nursing notes.  Pertinent labs & imaging results that were available during my care of the patient were reviewed by me and considered in my medical decision making (see chart for details).   1.  Abrasion of left cornea Fluorescein staining reveals corneal abrasion of the left eye.  Erythromycin eye ointment to the left eye twice daily for the next 7 days prescribed.  Advised patient to apply warm compresses to the left eye as well to reduce infection and drainage.  Advised to change pillowcase after 2 to 3 days to prevent reinfection.  Discussed follow-up with ophthalmology if symptoms fail to improve or worsen in the next few days.  Strict ED return precautions given.   Discussed physical exam and available lab work findings in clinic with patient.  Counseled patient regarding appropriate use of medications and potential side effects for all medications recommended or prescribed today. Discussed red flag signs and symptoms of worsening condition,when to call the PCP office, return to urgent care, and when to seek higher level of care in the emergency department. Patient verbalizes understanding and agreement with plan. All questions answered. Patient discharged in stable condition.    Final Clinical Impressions(s) / UC Diagnoses   Final diagnoses:  Abrasion of left cornea, initial encounter     Discharge Instructions      Place a thin layer of erythromycin eye ointment into the left eye twice daily for the next 7 days to treat infection.  You may take Tylenol or ibuprofen over-the-counter as needed for any pain you may have once the numbing to the left eye wears off.  Apply warm compresses to the left eye to promote drainage and help with infection.  Change her pillowcase after 2 to 3 days of antibiotic use to prevent reinfection.  Wear sunglasses to protect your eyes from the light.  If you develop any new or worsening symptoms or do not improve in the next  2 to 3 days, please return.  If your symptoms are severe, please go to the emergency room.  Follow-up with your primary care provider for further evaluation and management of your symptoms as well as ongoing wellness visits.  I hope you feel better!     ED Prescriptions     Medication Sig Dispense Auth. Provider   erythromycin ophthalmic ointment Place a 1/2 inch ribbon of ointment into the lower eyelid. 3.5 g Talbot Grumbling, FNP      PDMP not reviewed this encounter.   Talbot Grumbling, [place] 12/10/21 1521

## 2021-12-10 NOTE — Discharge Instructions (Signed)
Place a thin layer of erythromycin eye ointment into the left eye twice daily for the next 7 days to treat infection.  You may take Tylenol or ibuprofen over-the-counter as needed for any pain you may have once the numbing to the left eye wears off.  Apply warm compresses to the left eye to promote drainage and help with infection.  Change her pillowcase after 2 to 3 days of antibiotic use to prevent reinfection.  Wear sunglasses to protect your eyes from the light.  If you develop any new or worsening symptoms or do not improve in the next 2 to 3 days, please return.  If your symptoms are severe, please go to the emergency room.  Follow-up with your primary care provider for further evaluation and management of your symptoms as well as ongoing wellness visits.  I hope you feel better!

## 2021-12-10 NOTE — ED Triage Notes (Signed)
Pt reports left eye pain x 5 days.

## 2021-12-14 ENCOUNTER — Ambulatory Visit (HOSPITAL_COMMUNITY)
Admission: EM | Admit: 2021-12-14 | Discharge: 2021-12-14 | Disposition: A | Discharge status: Home / Self Care | Payer: No Typology Code available for payment source | Attending: Internal Medicine | Admitting: Internal Medicine

## 2021-12-14 ENCOUNTER — Encounter (HOSPITAL_COMMUNITY): Payer: Self-pay | Admitting: Emergency Medicine

## 2021-12-14 ENCOUNTER — Other Ambulatory Visit: Payer: Self-pay

## 2021-12-14 ENCOUNTER — Ambulatory Visit: Payer: Medicaid Other | Admitting: Family Medicine

## 2021-12-14 DIAGNOSIS — H5789 Other specified disorders of eye and adnexa: Secondary | ICD-10-CM

## 2021-12-14 DIAGNOSIS — S0502XD Injury of conjunctiva and corneal abrasion without foreign body, left eye, subsequent encounter: Secondary | ICD-10-CM

## 2021-12-14 MED ORDER — AMOXICILLIN-POT CLAVULANATE 875-125 MG PO TABS
1.0000 | ORAL_TABLET | Freq: Two times a day (BID) | ORAL | 0 refills | Status: DC
  Filled 2021-12-14: qty 14, 7d supply, fill #0

## 2021-12-14 NOTE — Discharge Instructions (Signed)
Take Augmentin twice daily for the next 7 days.  Continue warm compresses to your eye prior to applying the eye ointment.  Call the ophthalmologist listed on your paperwork for follow-up if your symptoms fail to improve in the next 5 to 7 days while taking the Augmentin antibiotic.  Return to urgent care as needed. I hope you feel better!

## 2021-12-14 NOTE — ED Provider Notes (Signed)
[place]    CSN: [id number] Arrival date & time: 12/14/21  1138      History   Chief Complaint Chief Complaint  Patient presents with   Eye Pain    HPI Katie Benson is a 41 y.o. female.   Patient presents to urgent care for right valuation of the left eye irritation, pain, and swelling.  I personally saw this patient 4 days ago and diagnosed her with a corneal abrasion via fluorescein staining.  Patient was discharged home with erythromycin eye ointment and advised to use this twice daily for 7 days.  Patient has been using this as prescribed but continues to experience eye irritation, "eye puffiness", and blurry vision to the left eye as a result of applying the erythromycin eye ointment.  She has not attempted to call the ophthalmologist provided on the discharge paperwork at last visit and states that she feels as though her eye is not improving.  She has performed warm compresses 1-2 times since leaving urgent care and states that she has been attempting to do this but simply cannot remember to.  No headache, fever/chills, blurry vision, decreased visual acuity, dizziness, nausea, vomiting, or viral URI symptoms.  She states that the eye redness has improved significantly but she is mostly worried about the surrounding left eye swelling and "puffiness".   Eye Pain    Past Medical History:  Diagnosis Date   Anemia    Arthritis    Fibroids    GERD (gastroesophageal reflux disease)    History of kidney stones    Neck injury     Patient Active Problem List   Diagnosis Date Noted   Osteoarthritis of left knee 12/14/2020   Subacromial bursitis of right shoulder joint 07/15/2020   Cervical radiculopathy 06/24/2020   Effusion, left knee 06/24/2019   Pes anserine bursitis 04/29/2019   Bucket handle tear of lateral meniscus of left knee 12/12/2018   Ganglion cyst 12/12/2018   Fibroids 03/18/2018   S/P parathyroidectomy ([place]) 02/12/2018   Primary  hyperparathyroidism ([place]) 01/08/2018   Hyperparathyroidism , secondary, non-renal ([place]) 01/08/2018   Fibroid uterus 12/06/2017   Hypercalcemia 12/06/2017   Fibroid 10/15/2017   Abnormal uterine bleeding (AUB) 08/20/2017   Anemia 08/20/2017    Past Surgical History:  Procedure Laterality Date   CESAREAN SECTION     IR ANGIOGRAM PELVIS SELECTIVE OR SUPRASELECTIVE  03/18/2018   IR ANGIOGRAM PELVIS SELECTIVE OR SUPRASELECTIVE  03/18/2018   IR ANGIOGRAM SELECTIVE EACH ADDITIONAL VESSEL  03/18/2018   IR ANGIOGRAM SELECTIVE EACH ADDITIONAL VESSEL  03/18/2018   IR EMBO TUMOR ORGAN ISCHEMIA INFARCT INC GUIDE ROADMAPPING  03/18/2018   IR RADIOLOGIST EVAL & MGMT  10/17/2017   IR RADIOLOGIST EVAL & MGMT  12/20/2017   IR RADIOLOGIST EVAL & MGMT  04/17/2018   IR US GUIDE VASC ACCESS RIGHT  03/18/2018   KNEE ARTHROSCOPY WITH LATERAL MENISECTOMY Left 12/24/2018   Procedure: LEFT KNEE ARTHROSCOPY WITH LATERAL MENISECTOM, debriment of GANGLION CYST;  Surgeon: [name], MD;  Location: [facility];  Service: Orthopedics;  Laterality: Left;   PARATHYROIDECTOMY N/A 02/12/2018   Procedure: PARATHYROIDECTOMY;  Surgeon: Fredirick Maudlin, MD;  Location: [facility];  Service: General;  Laterality: N/A;   WISDOM TOOTH EXTRACTION     age 57    OB History     Gravida  2   Para  1   Term  1   Preterm  0   AB  1   Living  1      SAB  1   IAB  0   Ectopic  0   Multiple  0   Live Births  1            Home Medications    Prior to Admission medications   Medication Sig Start Date End Date Taking? Authorizing Provider  amoxicillin-clavulanate (AUGMENTIN) 875-125 MG tablet Take 1 tablet by mouth every 12 (twelve) hours. 12/14/21  Yes Ender Rorke, Stasia Cavalier, FNP  AMBULATORY NON FORMULARY MEDICATION Medication Name:  Nitroglycerin ointment 0.125% three times daily use pea sized amount per rectum for 6-8 weeks 12/15/20   Mauri Pole, MD  cyclobenzaprine (FLEXERIL) 10 MG tablet Take 1  tablet (10 mg total) by mouth 2 (two) times daily. 09/19/21   Magnant, Charles L, PA-C  desipramine (NORPRAMIN) 25 MG tablet Take 1 tablet (25 mg total) by mouth at bedtime. 08/25/21   Lucilla Lame, MD  desipramine (NORPRAMIN) 25 MG tablet take 1 tab by mouth at bedtime 08/25/21     dicyclomine (BENTYL) 20 MG tablet TAKE 1 TABLET BY MOUTH EVERY 6 HOURS AS NEEDED FOR SPASMS 12/15/20   Mauri Pole, MD  erythromycin ophthalmic ointment Place a 1/2 inch ribbon of ointment into the lower eyelid. 12/10/21   Talbot Grumbling, FNP  gabapentin (NEURONTIN) 100 MG capsule Take 1 capsule (100 mg total) by mouth 3 (three) times daily. 11/28/21   Magnant, Gerrianne Scale, PA-C  hydrocortisone (ANUSOL-HC) 2.5 % rectal cream Place 1 application. rectally 2 (two) times daily as needed for hemorrhoids or anal itching. 06/15/21   Mauri Pole, MD  hyoscyamine (LEVBID) 0.375 MG 12 hr tablet Take 1 tablet (0.375 mg total) by mouth 2 (two) times daily. 08/25/21   Lucilla Lame, MD  hyoscyamine (LEVBID) 0.375 MG 12 hr tablet Take 1 tabley by mouth twice daily 08/25/21     Lidocaine, Anorectal, (RECTICARE) 5 % CREA Apply 1 Application topically every 4 (four) hours as needed. 08/25/21   Lucilla Lame, MD  lidocaine (LIDODERM) 5 % Apply 2 patches topically daily as needed. Leave patch on for maximum duration of 12 hours.  Do not place 2 patches on one area within 24 hours 11/29/21   Magnant, Gerrianne Scale, PA-C  meloxicam (MOBIC) 15 MG tablet Take 1 tablet (15 mg total) by mouth daily. Patient not taking: Reported on 10/10/2021 12/14/20   Ladell Pier, MD  methocarbamol (ROBAXIN) 500 MG tablet Take 1 tablet (500 mg total) by mouth 3 (three) times daily. 11/29/21   Lorine Bears, NP  pantoprazole (PROTONIX) 40 MG tablet Take 1 tablet (40 mg total) by mouth daily. 12/15/20   Mauri Pole, MD  polyethylene glycol (MIRALAX / GLYCOLAX) 17 g packet Take 17 g by mouth daily. Patient not taking: Reported on 10/10/2021     [provider]    Family History Family History  Problem Relation Age of Onset   Diabetes Sister    Cancer Maternal Grandmother        ovarian   Hypercalcemia Mother    Other Father        HX Unknown    Social History Social History   Tobacco Use   Smoking status: Some Days    Packs/day: 0.25    Years: 14.00    Total pack years: 3.50    Types: Cigarettes   Smokeless tobacco: Never  Vaping Use   Vaping Use: Never used  Substance Use Topics   Alcohol use:  Yes    Comment: occassionally on weekends   Drug use: Yes    Types: Marijuana    Comment: multiple times a day     Allergies   Pork-derived products   Review of Systems Review of Systems  Eyes:  Positive for pain.  Per HPI   Physical Exam Triage Vital Signs ED Triage Vitals [12/14/21 1307]  Enc Vitals Group     BP 128/85     Pulse Rate 99     Resp 18     Temp 98.6 F (37 C)     Temp Source Oral     SpO2 100 %     Weight      Height      Head Circumference      Peak Flow      Pain Score      Pain Loc      Pain Edu?      Excl. in [place]?    No data found.  Updated Vital Signs BP 128/85 (BP Location: Right Arm)   Pulse 99   Temp 98.6 F (37 C) (Oral)   Resp 18   SpO2 100%   Visual Acuity Right Eye Distance:   Left Eye Distance:   Bilateral Distance:    Right Eye Near:   Left Eye Near:    Bilateral Near:     Physical Exam Vitals and nursing note reviewed.  Constitutional:      Appearance: She is not ill-appearing or toxic-appearing.  HENT:     Head: Normocephalic and atraumatic.     Right Ear: Hearing and external ear normal.     Left Ear: Hearing and external ear normal.     Nose: Nose normal.     Mouth/Throat:     Lips: Pink.  Eyes:     General: Lids are normal. Vision grossly intact. Gaze aligned appropriately.        Right eye: No discharge.        Left eye: Discharge present.    Extraocular Movements: Extraocular movements intact.     Conjunctiva/sclera:      Right eye: Right conjunctiva is not injected. No chemosis or exudate.    Left eye: Left conjunctiva is injected. No chemosis or exudate.    Pupils: Pupils are equal, round, and reactive to light.     Comments: Preseptal swelling present without significant erythema or warmth to palpation. Left eye remains slightly injected.  Vision remains grossly intact.  EOMs remain normal without dizziness or pain elicited.  Cardiovascular:     Rate and Rhythm: Normal rate and regular rhythm.     Heart sounds: Normal heart sounds, S1 normal and S2 normal.  Pulmonary:     Effort: Pulmonary effort is normal. No respiratory distress.     Breath sounds: Normal breath sounds and air entry.  Musculoskeletal:     Cervical back: Neck supple.  Skin:    General: Skin is warm and dry.     Capillary Refill: Capillary refill takes less than 2 seconds.     Findings: No rash.  Neurological:     General: No focal deficit present.     Mental Status: She is alert and oriented to person, place, and time. Mental status is at baseline.     Cranial Nerves: No dysarthria or facial asymmetry.  Psychiatric:        Mood and Affect: Mood normal.        Speech: Speech normal.  Behavior: Behavior normal.        Thought Content: Thought content normal.        Judgment: Judgment normal.      UC Treatments / Results  Labs (all labs ordered are listed, but only abnormal results are displayed) Labs Reviewed - No data to display  EKG   Radiology No results found.  Procedures Procedures (including critical care time)  Medications Ordered in UC Medications - No data to display  Initial Impression / Assessment and Plan / UC Course  I have reviewed the triage vital signs and the nursing notes.  Pertinent labs & imaging results that were available during my care of the patient were reviewed by me and considered in my medical decision making (see chart for details).   1.  Abrasion of left cornea subsequent  encounter Symptoms have persisted for the last 4 days since evaluation in urgent care on December 10, 2021 and have only improved slightly with the erythromycin eye ointment.  Due to persistent symptoms and left eye swelling, we will go ahead and treat with oral antibiotics.  Augmentin twice daily for the next 7 days advised.  Patient already takes a probiotic and has been advised to take this medicine with food to avoid stomach upset.  Continue warm compresses and call ophthalmologist if symptoms fail to improve or worsen in the next 5 to 7 days while taking oral antibiotic.  Patient expresses agreement with this plan.   Discussed physical exam and available lab work findings in clinic with patient.  Counseled patient regarding appropriate use of medications and potential side effects for all medications recommended or prescribed today. Discussed red flag signs and symptoms of worsening condition,when to call the PCP office, return to urgent care, and when to seek higher level of care in the emergency department. Patient verbalizes understanding and agreement with plan. All questions answered. Patient discharged in stable condition.    Final Clinical Impressions(s) / UC Diagnoses   Final diagnoses:  Abrasion of left cornea, subsequent encounter  Eye irritation     Discharge Instructions      Take Augmentin twice daily for the next 7 days.  Continue warm compresses to your eye prior to applying the eye ointment.  Call the ophthalmologist listed on your paperwork for follow-up if your symptoms fail to improve in the next 5 to 7 days while taking the Augmentin antibiotic.  Return to urgent care as needed. I hope you feel better!     ED Prescriptions     Medication Sig Dispense Auth. Provider   amoxicillin-clavulanate (AUGMENTIN) 875-125 MG tablet Take 1 tablet by mouth every 12 (twelve) hours. 14 tablet Talbot Grumbling, FNP      PDMP not reviewed this encounter.   Talbot Grumbling, [place] 12/14/21 1354

## 2021-12-14 NOTE — ED Triage Notes (Signed)
Pt reports left eye pain, irritation and drainage. States she was seen here on 10/14 and was advised if symptoms worsened to return to UC.

## 2021-12-22 ENCOUNTER — Ambulatory Visit (INDEPENDENT_AMBULATORY_CARE_PROVIDER_SITE_OTHER): Payer: No Typology Code available for payment source | Admitting: Family Medicine

## 2021-12-22 ENCOUNTER — Encounter: Payer: Self-pay | Admitting: Family Medicine

## 2021-12-22 VITALS — BP 142/86 | HR 95 | Temp 97.6°F | Ht 62.0 in | Wt 134.0 lb

## 2021-12-22 DIAGNOSIS — M79675 Pain in left toe(s): Secondary | ICD-10-CM

## 2021-12-22 DIAGNOSIS — Z8719 Personal history of other diseases of the digestive system: Secondary | ICD-10-CM

## 2021-12-22 DIAGNOSIS — L600 Ingrowing nail: Secondary | ICD-10-CM

## 2021-12-22 DIAGNOSIS — G8929 Other chronic pain: Secondary | ICD-10-CM | POA: Insufficient documentation

## 2021-12-22 DIAGNOSIS — M1712 Unilateral primary osteoarthritis, left knee: Secondary | ICD-10-CM

## 2021-12-22 DIAGNOSIS — H538 Other visual disturbances: Secondary | ICD-10-CM

## 2021-12-22 DIAGNOSIS — S0502XA Injury of conjunctiva and corneal abrasion without foreign body, left eye, initial encounter: Secondary | ICD-10-CM

## 2021-12-22 NOTE — Patient Instructions (Signed)
Try getting wetting drops for your eyes.  You can hold off on the antibiotics for now. Systane is a good choice or something similar for the eyedrops.   I have referred you to the ophthalmologist and they should call you to schedule an appointment.  I also referred you to podiatry and they will call you to schedule an appointment about your left foot.  I recommend close follow-up with your orthopedist.      Corneal Abrasion  A corneal abrasion is a scratch or injury to the clear covering over the front of the eye (cornea). Your cornea forms a clear dome that protects your eye and helps to focus your vision. Your cornea is made up of many layers, but the surface layer is one of the most sensitive tissues in your body. A corneal abrasion can be very painful. If a corneal abrasion is not treated, it can become infected and cause an ulcer. This can lead to scarring. A scarred cornea can affect your vision. Sometimes abrasions come back in the same area, even after the original injury has healed. What are the causes? This condition may be caused by: A poke in the eye. A gritty or irritating substance (foreign body) in the eye. Excessive eye rubbing. Very dry eyes. Certain eye infections. Contact lenses that fit poorly or are worn for a long period of time. You can also injure your cornea when putting contact lenses in your eye or taking them out. Eye surgery. Certain cornea problems may increase the chance of a corneal abrasion. Sometimes, the cause is not known. What are the signs or symptoms? Symptoms of this condition include: Eye pain. The pain may get worse when you open and close your eye or when you move your eye. A feeling of something stuck in your eye. Tearing, redness, and sensitivity to light. Having trouble keeping your eye open, or not being able to keep it open. Blurred vision. Headache. How is this diagnosed? You may work with a health care provider who specializes  in diseases and conditions of the eye (ophthalmologist). This condition may be diagnosed based on your medical history, symptoms, and an eye exam. Before the eye exam, numbing drops may be put into your eye. You may also have dye put in your eye with a dropper or a small paper strip. The dye makes the abrasion easy to see when your ophthalmologist examines your eye with a light. Your ophthalmologist may look at your eye through an eye scope (slit lamp). How is this treated? Treatment may vary depending on the cause of your condition, and it may include: Washing out your eye. Removing any foreign bodies that are in your eye. Using antibiotic drops or ointment to treat or prevent an infection. Using a dilating drop to decrease inflammation and pain. Using steroid drops or ointment to treat redness, irritation, or inflammation. Applying a cold, wet cloth (cold compress) or ice pack to ease the pain. Taking pain medicine by mouth (orally). In some cases, an eye patch or bandage soft contact lens might also be used. An eye patch should not be used if the corneal abrasion was related to contact lens wear as it can increase the chance of infection in these eyes. Follow these instructions at home: Medicines Use eye drops or ointments as told by your health care provider. If you were prescribed antibiotic drops or ointment, use them as told by your health care provider. Do not stop using the antibiotic even if  you start to feel better. Take over-the-counter and prescription medicines only as told by your health care provider. Ask your health care provider if the medicine prescribed to you: Requires you to avoid driving or using heavy machinery. Can cause constipation. You may need to take these actions to prevent or treat constipation: Drink enough fluid to keep your urine pale yellow. Take over-the-counter or prescription medicines. Eat foods that are high in fiber, such as beans, whole grains, and  fresh fruits and vegetables. Limit foods that are high in fat and processed sugars, such as fried or sweet foods. Eye patch use If you have an eye patch, wear it as told by your health care provider. Do not drive or use machinery while wearing an eye patch. Your ability to judge distances will be impaired. Follow instructions from your health care provider about when to remove the patch. General instructions Ask your health care provider whether you can use a cold compress on your eye to relieve pain. Do not rub or touch your eye. Do not wash out your eye. Do not wear contact lenses until your health care provider says that this is okay. Avoid bright light and eye strain. Keep all follow-up visits as told by your health care provider. This is important for preventing infection and vision loss. Contact a health care provider if: You continue to have eye pain and other symptoms for more than 2 days. You have new symptoms, such as worse redness, tearing, or discharge. You have discharge that makes your eyelids stick together in the morning. Your eye patch becomes so loose that you can blink your eye. Symptoms return after the original abrasion has healed. Get help right away if: You have severe eye pain that does not get better with medicine. You have vision loss. Summary A corneal abrasion is a scratch or injury to the clear covering over the front of the eye (cornea). It is important to get treatment for a corneal abrasion. If this problem is not treated, it can affect your vision. Use eye drops or ointments as told by your health care provider. If you have an eye patch, do not drive or use machinery while wearing it. Your ability to judge distances will be impaired. Let your health care provider know if your symptoms continue for more than 2 days. This information is not intended to replace advice given to you by your health care provider. Make sure you discuss any questions you have with  your health care provider. Document Revised: 07/27/2021 Document Reviewed: 06/21/2018 Elsevier Patient Education  [place].

## 2021-12-22 NOTE — Progress Notes (Signed)
New Patient Office Visit  Subjective    Patient ID: Katie Benson, female    DOB: 1980-10-02  Age: 41 y.o. MRN: [id number]  CC:  Chief Complaint  Patient presents with   Establish Care    Would like possible podiatrist referral ([facility] preference) and would like her back looked at as there is a mass in the center.     HPI Katie Benson presents to establish care  She has been to urgent care on 2 occasions for a left corneal abrasion.  She completed a course of Augmentin and erythromycin ointment for her left eye.  She reports blurry vision and persistent pain of her left eye.  C/o low back pain on the right radiating down her right leg. Pain is worse throughout the day.   She tried PT for the back.  Recently had an injection of her low back. Under the care of ortho.   Left knee swelling and pain, chronic since 2020.   Left 5th toe with nail issue and pain, unable to wear a shoe.  Request referral to podiatry.  Denies fever, chills, dizziness, chest pain, palpitations, shortness of breath,  N/V/D, LE edema.     Outpatient Encounter Medications as of 12/22/2021  Medication Sig   AMBULATORY NON FORMULARY MEDICATION Medication Name:  Nitroglycerin ointment 0.125% three times daily use pea sized amount per rectum for 6-8 weeks   cyclobenzaprine (FLEXERIL) 10 MG tablet Take 1 tablet (10 mg total) by mouth 2 (two) times daily.   desipramine (NORPRAMIN) 25 MG tablet take 1 tab by mouth at bedtime   dicyclomine (BENTYL) 20 MG tablet TAKE 1 TABLET BY MOUTH EVERY 6 HOURS AS NEEDED FOR SPASMS   gabapentin (NEURONTIN) 100 MG capsule Take 1 capsule (100 mg total) by mouth 3 (three) times daily.   hydrocortisone (ANUSOL-HC) 2.5 % rectal cream Place 1 application. rectally 2 (two) times daily as needed for hemorrhoids or anal itching.   lidocaine (LIDODERM) 5 % Apply 2 patches topically daily as needed. Leave patch on for maximum duration of 12 hours.  Do not place 2 patches  on one area within 24 hours   Lidocaine, Anorectal, (RECTICARE) 5 % CREA Apply 1 Application topically every 4 (four) hours as needed.   meloxicam (MOBIC) 15 MG tablet Take 1 tablet (15 mg total) by mouth daily.   methocarbamol (ROBAXIN) 500 MG tablet Take 1 tablet (500 mg total) by mouth 3 (three) times daily.   polyethylene glycol (MIRALAX / GLYCOLAX) 17 g packet Take 17 g by mouth daily.   [DISCONTINUED] amoxicillin-clavulanate (AUGMENTIN) 875-125 MG tablet Take 1 tablet by mouth every 12 (twelve) hours.   [DISCONTINUED] pantoprazole (PROTONIX) 40 MG tablet Take 1 tablet (40 mg total) by mouth daily.   hyoscyamine (LEVBID) 0.375 MG 12 hr tablet Take 1 tabley by mouth twice daily (Patient not taking: Reported on 12/22/2021)   [DISCONTINUED] desipramine (NORPRAMIN) 25 MG tablet Take 1 tablet (25 mg total) by mouth at bedtime.   [DISCONTINUED] erythromycin ophthalmic ointment Place a 1/2 inch ribbon of ointment into the lower eyelid. (Patient not taking: Reported on 12/22/2021)   [DISCONTINUED] hyoscyamine (LEVBID) 0.375 MG 12 hr tablet Take 1 tablet (0.375 mg total) by mouth 2 (two) times daily.   Facility-Administered Encounter Medications as of 12/22/2021  Medication   0.9 %  sodium chloride infusion    Past Medical History:  Diagnosis Date   Anemia    Arthritis    Fibroids    GERD (  gastroesophageal reflux disease)    History of kidney stones    Neck injury     Past Surgical History:  Procedure Laterality Date   CESAREAN SECTION     IR ANGIOGRAM PELVIS SELECTIVE OR SUPRASELECTIVE  03/18/2018   IR ANGIOGRAM PELVIS SELECTIVE OR SUPRASELECTIVE  03/18/2018   IR ANGIOGRAM SELECTIVE EACH ADDITIONAL VESSEL  03/18/2018   IR ANGIOGRAM SELECTIVE EACH ADDITIONAL VESSEL  03/18/2018   IR EMBO TUMOR ORGAN ISCHEMIA INFARCT INC GUIDE ROADMAPPING  03/18/2018   IR RADIOLOGIST EVAL & MGMT  10/17/2017   IR RADIOLOGIST EVAL & MGMT  12/20/2017   IR RADIOLOGIST EVAL & MGMT  04/17/2018   IR US GUIDE VASC  ACCESS RIGHT  03/18/2018   KNEE ARTHROSCOPY WITH LATERAL MENISECTOMY Left 12/24/2018   Procedure: LEFT KNEE ARTHROSCOPY WITH LATERAL MENISECTOM, debriment of GANGLION CYST;  Surgeon: [name], MD;  Location: [facility];  Service: Orthopedics;  Laterality: Left;   PARATHYROIDECTOMY N/A 02/12/2018   Procedure: PARATHYROIDECTOMY;  Surgeon: Fredirick Maudlin, MD;  Location: [facility];  Service: General;  Laterality: N/A;   WISDOM TOOTH EXTRACTION     age 11    Family History  Problem Relation Age of Onset   Diabetes Sister    Cancer Maternal Grandmother        ovarian   Hypercalcemia Mother    Other Father        HX Unknown    Social History   Socioeconomic History   Marital status: Single    Spouse name: Not on file   Number of children: 1   Years of education: Not on file   Highest education level: Not on file  Occupational History   Occupation: unemployed  Tobacco Use   Smoking status: Some Days    Packs/day: 0.25    Years: 14.00    Total pack years: 3.50    Types: Cigarettes   Smokeless tobacco: Never  Vaping Use   Vaping Use: Never used  Substance and Sexual Activity   Alcohol use: Yes    Comment: occassionally on weekends   Drug use: Yes    Types: Marijuana    Comment: multiple times a day   Sexual activity: Yes    Partners: Male    Birth control/protection: Condom  Other Topics Concern   Not on file  Social History Narrative   Not on file   Social Determinants of Health   Financial Resource Strain: Not on file  Food Insecurity: Not on file  Transportation Needs: Not on file  Physical Activity: Not on file  Stress: Not on file  Social Connections: Not on file  Intimate Partner Violence: Not on file    ROS      Objective    BP (!) 142/86 (BP Location: Left Arm, Patient Position: Sitting, Cuff Size: Large)   Pulse 95   Temp 97.6 F (36.4 C) (Temporal)   Ht '5\' 2"'$  (1.575 m)   Wt 134 lb (60.8 kg)   SpO2 99%   BMI 24.51 kg/m   Physical  Exam Constitutional:      General: She is not in acute distress.    Appearance: She is not ill-appearing.  HENT:     Mouth/Throat:     Mouth: Mucous membranes are moist.  Eyes:     General: Lids are normal. Vision grossly intact.        Left eye: No discharge.     Extraocular Movements: Extraocular movements intact.     Conjunctiva/sclera: Conjunctivae  normal.     Pupils: Pupils are equal, round, and reactive to light.  Cardiovascular:     Rate and Rhythm: Normal rate and regular rhythm.  Pulmonary:     Effort: Pulmonary effort is normal.     Breath sounds: Normal breath sounds.  Musculoskeletal:        General: Normal range of motion.     Right lower leg: No edema.     Left lower leg: No edema.  Feet:     Comments: Left 5th toe with abnormal nail contour and color. TTP. No sign of infection  Skin:    General: Skin is warm and dry.  Neurological:     General: No focal deficit present.     Mental Status: She is alert and oriented to person, place, and time.  Psychiatric:        Mood and Affect: Mood normal.        Behavior: Behavior normal.         Assessment & Plan:   Problem List Items Addressed This Visit       Musculoskeletal and Integument   Ingrown nail of fifth toe    Referral to podiatry for further evaluation and treatment.       Relevant Orders   Ambulatory referral to Podiatry   Osteoarthritis of left knee    Continue follow up with orthopedist.         Other   Blurry vision, bilateral   Relevant Orders   Ambulatory referral to Ophthalmology   Chronic toe pain, left foot    Referral to podiatry. Wear wide toe shoes or shoes with open toe      Relevant Orders   Ambulatory referral to Podiatry   History of esophageal ulcer    Avoid NSAIDs      Left corneal abrasion - Primary    No sign of infection today. She has been evaluated and treated in the ED/UC. Reviewed notes. Recommend she see an eye doctor at this point. Use Systane,  preservative free, drops.       Relevant Orders   Ambulatory referral to Ophthalmology    Return if symptoms worsen or fail to improve.   Harland Dingwall, NP-C

## 2021-12-23 ENCOUNTER — Other Ambulatory Visit: Payer: Self-pay

## 2021-12-23 ENCOUNTER — Other Ambulatory Visit: Payer: Self-pay | Admitting: Gastroenterology

## 2021-12-23 MED ORDER — PANTOPRAZOLE SODIUM 40 MG PO TBEC
40.0000 mg | DELAYED_RELEASE_TABLET | Freq: Every day | ORAL | 3 refills | Status: DC
  Filled 2021-12-30: qty 30, 30d supply, fill #0
  Filled 2022-01-27 – 2022-01-31 (×2): qty 30, 30d supply, fill #1
  Filled 2022-03-07 – 2022-03-15 (×2): qty 30, 30d supply, fill #2
  Filled 2022-04-07: qty 30, 30d supply, fill #3
  Filled 2022-05-09 – 2022-05-30 (×2): qty 30, 30d supply, fill #4
  Filled 2022-06-24 – 2022-07-03 (×2): qty 30, 30d supply, fill #5
  Filled 2022-07-28: qty 30, 30d supply, fill #6
  Filled 2022-08-25 – 2022-09-07 (×2): qty 30, 30d supply, fill #7
  Filled 2022-10-11: qty 30, 30d supply, fill #8
  Filled 2022-11-16: qty 30, 30d supply, fill #9

## 2021-12-26 NOTE — Assessment & Plan Note (Signed)
Avoid NSAIDs 

## 2021-12-26 NOTE — Assessment & Plan Note (Signed)
Referral to podiatry. Wear wide toe shoes or shoes with open toe

## 2021-12-26 NOTE — Assessment & Plan Note (Signed)
Referral to podiatry for further evaluation and treatment.

## 2021-12-26 NOTE — Assessment & Plan Note (Signed)
No sign of infection today. She has been evaluated and treated in the ED/UC. Reviewed notes. Recommend she see an eye doctor at this point. Use Systane, preservative free, drops.

## 2021-12-26 NOTE — Assessment & Plan Note (Signed)
Continue follow up with orthopedist.

## 2021-12-30 ENCOUNTER — Other Ambulatory Visit: Payer: Self-pay

## 2022-01-03 ENCOUNTER — Other Ambulatory Visit: Payer: Self-pay

## 2022-01-03 ENCOUNTER — Ambulatory Visit (INDEPENDENT_AMBULATORY_CARE_PROVIDER_SITE_OTHER): Payer: No Typology Code available for payment source | Admitting: Podiatry

## 2022-01-03 ENCOUNTER — Other Ambulatory Visit: Payer: Self-pay | Admitting: Gastroenterology

## 2022-01-03 ENCOUNTER — Telehealth: Payer: Self-pay | Admitting: *Deleted

## 2022-01-03 ENCOUNTER — Other Ambulatory Visit: Payer: Self-pay | Admitting: Podiatry

## 2022-01-03 DIAGNOSIS — M205X1 Other deformities of toe(s) (acquired), right foot: Secondary | ICD-10-CM

## 2022-01-03 DIAGNOSIS — M205X2 Other deformities of toe(s) (acquired), left foot: Secondary | ICD-10-CM

## 2022-01-03 DIAGNOSIS — L603 Nail dystrophy: Secondary | ICD-10-CM

## 2022-01-03 DIAGNOSIS — L84 Corns and callosities: Secondary | ICD-10-CM

## 2022-01-03 MED ORDER — UREA NAIL 45 % EX GEL
1.0000 | Freq: Every day | CUTANEOUS | 1 refills | Status: DC
  Filled 2022-01-03: qty 28, fill #0

## 2022-01-03 MED ORDER — UREA NAIL 45 % EX GEL: 1.0000 | Freq: Every day | CUTANEOUS | 1 refills | Status: AC

## 2022-01-03 NOTE — Progress Notes (Unsigned)
Subjective:   Patient ID: Katie Benson, female   DOB: 41 y.o.   MRN: [id number]   HPI Chief Complaint  Patient presents with   Ingrown Toenail    Possible ingrown toenail on the 5th toenail left foot, started 3 months ago, Rate of pain 10+ out of 10, patient is unable to put closed in shoes on.      41 year old female presents the office today with above concerns.  She states that on the left side the nail was previously present but no longer there.  She states she tries to pick at it at times and some skin will come off with a nail.  She is a hurts to put pressure no issues.  No swelling redness or any drainage.  No open lesions.   Review of Systems  All other systems reviewed and are negative.  Past Medical History:  Diagnosis Date   Anemia    Arthritis    Fibroids    GERD (gastroesophageal reflux disease)    History of kidney stones    Neck injury     Past Surgical History:  Procedure Laterality Date   CESAREAN SECTION     IR ANGIOGRAM PELVIS SELECTIVE OR SUPRASELECTIVE  03/18/2018   IR ANGIOGRAM PELVIS SELECTIVE OR SUPRASELECTIVE  03/18/2018   IR ANGIOGRAM SELECTIVE EACH ADDITIONAL VESSEL  03/18/2018   IR ANGIOGRAM SELECTIVE EACH ADDITIONAL VESSEL  03/18/2018   IR EMBO TUMOR ORGAN ISCHEMIA INFARCT INC GUIDE ROADMAPPING  03/18/2018   IR RADIOLOGIST EVAL & MGMT  10/17/2017   IR RADIOLOGIST EVAL & MGMT  12/20/2017   IR RADIOLOGIST EVAL & MGMT  04/17/2018   IR US GUIDE VASC ACCESS RIGHT  03/18/2018   KNEE ARTHROSCOPY WITH LATERAL MENISECTOMY Left 12/24/2018   Procedure: LEFT KNEE ARTHROSCOPY WITH LATERAL MENISECTOM, debriment of GANGLION CYST;  Surgeon: [name], MD;  Location: [facility];  Service: Orthopedics;  Laterality: Left;   PARATHYROIDECTOMY N/A 02/12/2018   Procedure: PARATHYROIDECTOMY;  Surgeon: Fredirick Maudlin, MD;  Location: [facility];  Service: General;  Laterality: N/A;   WISDOM TOOTH EXTRACTION     age 69     Current Outpatient Medications:     AMBULATORY NON FORMULARY MEDICATION, Medication Name:  Nitroglycerin ointment 0.125% three times daily use pea sized amount per rectum for 6-8 weeks, Disp: 30 g, Rfl: 1   cyclobenzaprine (FLEXERIL) 10 MG tablet, Take 1 tablet (10 mg total) by mouth 2 (two) times daily., Disp: 30 tablet, Rfl: 2   desipramine (NORPRAMIN) 25 MG tablet, take 1 tab by mouth at bedtime, Disp: 30 tablet, Rfl: 5   dicyclomine (BENTYL) 20 MG tablet, TAKE 1 TABLET BY MOUTH EVERY 6 HOURS AS NEEDED FOR SPASMS, Disp: 30 tablet, Rfl: 3   gabapentin (NEURONTIN) 100 MG capsule, Take 1 capsule (100 mg total) by mouth 3 (three) times daily., Disp: 90 capsule, Rfl: 1   hydrocortisone (ANUSOL-HC) 2.5 % rectal cream, Place 1 application. rectally 2 (two) times daily as needed for hemorrhoids or anal itching., Disp: 30 g, Rfl: 0   hyoscyamine (LEVBID) 0.375 MG 12 hr tablet, Take 1 tabley by mouth twice daily (Patient not taking: Reported on 12/22/2021), Disp: 60 tablet, Rfl: 5   lidocaine (LIDODERM) 5 %, Apply 2 patches topically daily as needed. Leave patch on for maximum duration of 12 hours.  Do not place 2 patches on one area within 24 hours, Disp: 30 patch, Rfl: 1   Lidocaine, Anorectal, (RECTICARE) 5 % CREA, Apply 1 Application topically  every 4 (four) hours as needed., Disp: 28 g, Rfl: 1   meloxicam (MOBIC) 15 MG tablet, Take 1 tablet (15 mg total) by mouth daily., Disp: 30 tablet, Rfl: 1   methocarbamol (ROBAXIN) 500 MG tablet, Take 1 tablet (500 mg total) by mouth 3 (three) times daily., Disp: 90 tablet, Rfl: 0   pantoprazole (PROTONIX) 40 MG tablet, Take 1 tablet (40 mg total) by mouth daily., Disp: 90 tablet, Rfl: 3   polyethylene glycol (MIRALAX / GLYCOLAX) 17 g packet, Take 17 g by mouth daily., Disp: , Rfl:    Urea (UREA NAIL) 45 % GEL, Apply 1 Application topically daily., Disp: 28 mL, Rfl: 1  Current Facility-Administered Medications:    0.9 %  sodium chloride infusion, 500 mL, Intravenous, Once, Nandigam, Kavitha V,  MD  Allergies  Allergen Reactions   Pork-Derived Products Hives and Swelling           Objective:  Physical Exam  General: AAO x3, NAD  Dermatological: On the bilateral fifth toes just lateral to the nail with hyperkeratotic tissue.  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  The nails himself are dystrophic, hypertrophic with brown discoloration.  There is no edema, erythema, drainage or pus.  There is no open lesions.  Vascular: Dorsalis Pedis artery and Posterior Tibial artery pedal pulses are 2/4 bilateral with immedate capillary fill time.  There is no pain with calf compression, swelling, warmth, erythema.   Neruologic: Grossly intact via light touch bilateral.   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  Gait: Unassisted, Nonantalgic.       Assessment:   Adductovarus resulting listers corn, onychodystrophy     Plan:  -Treatment options discussed including all alternatives, risks, and complications -Etiology of symptoms were discussed -I sharply debrided the hyperkeratotic lesions x2 without any complications or bleeding.  At this time I do not see any evidence of ingrown toenails actually more of the hyperkeratotic tissue is causing the issue.  The nail is slightly hypertrophic as well.  Discussed urea nail gel to help with the thinning of this.  Dispensed offloading pads.  Discussed shoe modifications.  In the future may need to consider surgical intervention.  I do not appreciate any signs of infection today but she is to monitor very closely.  Had a long discussion with her in regards to this today.  We will see her back in 2 months or sooner if there is any worsening or changes.  Trula Slade DPM

## 2022-01-03 NOTE — Progress Notes (Signed)
error 

## 2022-01-03 NOTE — Telephone Encounter (Signed)
Patient is calling and the prescription (Urea Nail) is out of stock, please send to [facility] per patient,will have in stock tomorrow if ordered today before 6. Prescription has [facility] to [facility].

## 2022-01-03 NOTE — Patient Instructions (Signed)
Start a biotin supplement You can use urea nail gel on the toenails Continue offloading pads for the 5th toes to avoid pressure Monitor for any signs/symptoms of infection. Call the office immediately if any occur or go directly to the emergency room. Call with any questions/concerns.

## 2022-01-04 ENCOUNTER — Other Ambulatory Visit: Payer: Self-pay

## 2022-01-09 ENCOUNTER — Encounter: Payer: Self-pay | Admitting: Gastroenterology

## 2022-01-09 ENCOUNTER — Other Ambulatory Visit: Payer: Self-pay

## 2022-01-10 ENCOUNTER — Other Ambulatory Visit: Payer: Self-pay

## 2022-01-13 ENCOUNTER — Ambulatory Visit: Payer: Medicaid Other | Admitting: Internal Medicine

## 2022-01-13 ENCOUNTER — Other Ambulatory Visit: Payer: Self-pay

## 2022-01-13 ENCOUNTER — Ambulatory Visit (INDEPENDENT_AMBULATORY_CARE_PROVIDER_SITE_OTHER): Payer: Self-pay | Admitting: Surgical

## 2022-01-13 DIAGNOSIS — M79673 Pain in unspecified foot: Secondary | ICD-10-CM

## 2022-01-13 DIAGNOSIS — M1712 Unilateral primary osteoarthritis, left knee: Secondary | ICD-10-CM

## 2022-01-13 DIAGNOSIS — G8929 Other chronic pain: Secondary | ICD-10-CM

## 2022-01-13 DIAGNOSIS — M533 Sacrococcygeal disorders, not elsewhere classified: Secondary | ICD-10-CM

## 2022-01-13 MED ORDER — CYCLOBENZAPRINE HCL 10 MG PO TABS
10.0000 mg | ORAL_TABLET | Freq: Every day | ORAL | 2 refills | Status: DC
  Filled 2022-01-13: qty 30, 30d supply, fill #0
  Filled 2022-03-07 – 2022-03-15 (×2): qty 30, 30d supply, fill #1
  Filled 2022-04-07: qty 30, 30d supply, fill #2

## 2022-01-13 NOTE — Telephone Encounter (Signed)
Patient called. Says she is at the pharmacy waiting on her medication to be sent in. Her call back number is [phone]

## 2022-01-13 NOTE — Telephone Encounter (Signed)
I called patient. She has picked up Cyclobenzaprine.

## 2022-01-13 NOTE — Telephone Encounter (Signed)
Thanks again betsy

## 2022-01-14 ENCOUNTER — Encounter: Payer: Self-pay | Admitting: Surgical

## 2022-01-14 MED ORDER — BUPIVACAINE HCL 0.25 % IJ SOLN
4.0000 mL | INTRAMUSCULAR | Status: AC | PRN
  Administered 2022-01-13: 4 mL via INTRA_ARTICULAR

## 2022-01-14 MED ORDER — METHYLPREDNISOLONE ACETATE 40 MG/ML IJ SUSP
40.0000 mg | INTRAMUSCULAR | Status: AC | PRN
  Administered 2022-01-13: 40 mg via INTRA_ARTICULAR

## 2022-01-14 MED ORDER — LIDOCAINE HCL 1 % IJ SOLN
5.0000 mL | INTRAMUSCULAR | Status: AC | PRN
  Administered 2022-01-13: 5 mL

## 2022-01-14 NOTE — Progress Notes (Signed)
Office Visit Note   Patient: Katie Benson           Date of Birth: 02-22-1981           MRN: [id number] Visit Date: 01/13/2022 Requested by: Ladell Pier, MD [place],  [place] [address] PCP: Ladell Pier, MD  Subjective: Chief Complaint  Patient presents with   Lower Back - Pain    HPI: Katie Benson is a 41 y.o. female who presents to the office reporting continued left knee and right-sided back pain.  She states that her left knee pain has returned and she would like to repeat injection.  She was set up for SI joint injection with Dr. Rolena Infante at her last appointment and she states that this right-sided injection provided complete resolution of her low back pain for about 4 to 6 weeks..  Groin pain.  No radicular pain down the leg.  Taking gabapentin, Robaxin with Flexeril at night to help with pain control.  She also complains of left fifth toe pain and she feels like her small toe is externally rotated.  She had this evaluated by Dr. Earleen Newport but she would like someone else to look at her foot.  Changing her shoes and using padding in her Shoe has not helped..                ROS: All systems reviewed are negative as they relate to the chief complaint within the history of present illness.  Patient denies fevers or chills.  Assessment & Plan: Visit Diagnoses:  1. Unilateral primary osteoarthritis, left knee   2. Chronic right SI joint pain   3. Pain of foot, unspecified laterality     Plan: Patient is a 41 year old female who presents for evaluation of left knee pain and right SI joint pain/left foot pain.  She is concerned about external rotation of her left fifth toe and the nail being pressed against her shoe causes increased pain and tenderness in this location.  She would like to talk with Dr. Sharol Given about other options aside from cushioning in her shoe.  She has had great relief from her back pain with right-sided SI joint injection  by Dr. Rolena Infante.  Plan to refer her back to repeat injection whenever the next possible injection is.  If she has waning relief from this injection, could consider talking with Dr. Laurance Flatten or trying more pelvic focused physical therapy.  No significant help from the physical therapy lumbar spine exercises that she has been doing.  She also has history of primary lateral compartment arthritis in the left knee.  She would like to have this injected.  Left knee injection successfully delivered.  No repeat injection until at least 4 months from now.  Patient agreed with plan.  Follow-up as needed.  Follow-Up Instructions: No follow-ups on file.   Orders:  Orders Placed This Encounter  Procedures   Ambulatory referral to Orthopedic Surgery   Ambulatory referral to Orthopedic Surgery   No orders of the defined types were placed in this encounter.     Procedures: Large Joint Inj: L knee on 01/13/2022 2:21 PM Indications: diagnostic evaluation, joint swelling and pain Details: 18 G 1.5 in needle, superolateral approach  Arthrogram: No  Medications: 5 mL lidocaine 1 %; 40 mg methylPREDNISolone acetate 40 MG/ML; 4 mL bupivacaine 0.25 % Outcome: tolerated well, no immediate complications Procedure, treatment alternatives, risks and benefits explained, specific risks discussed. Consent was given  by the patient. Immediately prior to procedure a time out was called to verify the correct patient, procedure, equipment, support staff and site/side marked as required. Patient was prepped and draped in the usual sterile fashion.       Clinical Data: No additional findings.  Objective: Vital Signs: There were no vitals taken for this visit.  Physical Exam:  Constitutional: Patient appears well-developed HEENT:  Head: Normocephalic Eyes:EOM are normal Neck: Normal range of motion Cardiovascular: Normal rate Pulmonary/chest: Effort normal Neurologic: Patient is alert Skin: Skin is  warm Psychiatric: Patient has normal mood and affect  Ortho Exam: Ortho exam demonstrates left knee without significant effusion or any evidence of cellulitis or skin changes.  Able to perform straight leg raise.  She is able to put weight on the leg.  She has tenderness primarily over the lateral compartment with no medial joint line tenderness.  No calf tenderness.  Negative Homans' sign.  No pain with hip range of motion.  Specialty Comments:  MRI CERVICAL SPINE WITHOUT CONTRAST   TECHNIQUE: Multiplanar, multisequence MR imaging of the cervical spine was performed. No intravenous contrast was administered.   COMPARISON:  CT scan 12/26/2012   FINDINGS: Alignment: No vertebral subluxation is observed.   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 Disc desiccation at all levels between C2 and C7.   Cord: No significant abnormal spinal cord signal is observed.   Posterior Fossa, vertebral arteries, paraspinal tissues: Unremarkable   Disc levels:   C2-3: Unremarkable.   C3-4: Borderline bilateral foraminal stenosis due to disc bulge and uncinate spurring.   C4-5: Moderate to prominent bilateral foraminal stenosis and borderline central narrowing of the thecal sac due to disc bulge and uncinate spurring.   C5-6: Moderate right and moderate to prominent left foraminal stenosis and borderline central narrowing of the thecal sac due to disc bulge and uncinate spurring.   C6-7: Mild right and moderate left foraminal stenosis due to disc bulge and uncinate spurring.   C7-T1: No impingement.  Minimal uncinate spurring.   T1-2: Unremarkable.   IMPRESSION: 1. Cervical spondylosis, degenerative disc disease, and mildly congenitally short pedicles contribute to moderate to prominent impingement at C4-5; moderate impingement at C5-6; and mild impingement at C6-7, as detailed above.     Electronically Signed   By: Van Clines M.D.   On: 08/01/2020  09:25  ---------------------------------------------- EXAM: MRI LUMBAR SPINE WITHOUT CONTRAST   TECHNIQUE: Multiplanar, multisequence MR imaging of the lumbar spine was performed. No intravenous contrast was administered.   COMPARISON:  Lumbar spine x-rays dated June 23, 2021. CT abdomen pelvis dated March 26, 2020.   FINDINGS: Segmentation:  Standard.   Alignment:  Physiologic.   Vertebrae:  No fracture, evidence of discitis, or bone lesion.   Conus medullaris and cauda equina: Conus extends to the L1-L2 level. Conus and cauda equina appear normal.   Paraspinal and other soft tissues: Negative.   Disc levels:   T11-T12 to L2-L3: Negative.   L3-L4: Mild disc bulging and bilateral facet arthropathy. Mild bilateral neuroforaminal stenosis. No spinal canal stenosis.   L4-L5: Negative disc. Mild bilateral facet arthropathy. Mild bilateral neuroforaminal stenosis. No spinal canal stenosis.   L5-S1: Negative disc. Mild bilateral facet arthropathy. No stenosis.   IMPRESSION: 1. Mild degenerative changes of the lower lumbar spine as described above. No high-grade stenosis or impingement.     Electronically Signed   By: Titus Dubin M.D.   On: 08/19/2021 16:36  Imaging: No results found.  PMFS History: Patient Active Problem List   Diagnosis Date Noted   History of esophageal ulcer 12/22/2021   Ingrown nail of fifth toe 12/22/2021   Chronic toe pain, left foot 12/22/2021   Blurry vision, bilateral 12/22/2021   Left corneal abrasion 12/22/2021   Osteoarthritis of left knee 12/14/2020   Subacromial bursitis of right shoulder joint 07/15/2020   Cervical radiculopathy 06/24/2020   Effusion, left knee 06/24/2019   Pes anserine bursitis 04/29/2019   Bucket handle tear of lateral meniscus of left knee 12/12/2018   Ganglion cyst 12/12/2018   Fibroids 03/18/2018   S/P parathyroidectomy ([facility]) 02/12/2018   Primary hyperparathyroidism ([facility]) 01/08/2018    Hyperparathyroidism , secondary, non-renal ([facility]) 01/08/2018   Fibroid uterus 12/06/2017   Hypercalcemia 12/06/2017   Fibroid 10/15/2017   Abnormal uterine bleeding (AUB) 08/20/2017   Anemia 08/20/2017   Past Medical History:  Diagnosis Date   Anemia    Arthritis    Fibroids    GERD (gastroesophageal reflux disease)    History of kidney stones    Neck injury     Family History  Problem Relation Age of Onset   Diabetes Sister    Cancer Maternal Grandmother        ovarian   Hypercalcemia Mother    Other Father        HX Unknown    Past Surgical History:  Procedure Laterality Date   CESAREAN SECTION     IR ANGIOGRAM PELVIS SELECTIVE OR SUPRASELECTIVE  03/18/2018   IR ANGIOGRAM PELVIS SELECTIVE OR SUPRASELECTIVE  03/18/2018   IR ANGIOGRAM SELECTIVE EACH ADDITIONAL VESSEL  03/18/2018   IR ANGIOGRAM SELECTIVE EACH ADDITIONAL VESSEL  03/18/2018   IR EMBO TUMOR ORGAN ISCHEMIA INFARCT INC GUIDE ROADMAPPING  03/18/2018   IR RADIOLOGIST EVAL & MGMT  10/17/2017   IR RADIOLOGIST EVAL & MGMT  12/20/2017   IR RADIOLOGIST EVAL & MGMT  04/17/2018   IR US GUIDE VASC ACCESS RIGHT  03/18/2018   KNEE ARTHROSCOPY WITH LATERAL MENISECTOMY Left 12/24/2018   Procedure: LEFT KNEE ARTHROSCOPY WITH LATERAL MENISECTOM, debriment of GANGLION CYST;  Surgeon: [name], MD;  Location: [facility];  Service: Orthopedics;  Laterality: Left;   PARATHYROIDECTOMY N/A 02/12/2018   Procedure: PARATHYROIDECTOMY;  Surgeon: Fredirick Maudlin, MD;  Location: [facility] ORS;  Service: General;  Laterality: N/A;   WISDOM TOOTH EXTRACTION     age 62   Social History   Occupational History   Occupation: unemployed  Tobacco Use   Smoking status: Some Days    Packs/day: 0.25    Years: 14.00    Total pack years: 3.50    Types: Cigarettes   Smokeless tobacco: Never  Vaping Use   Vaping Use: Never used  Substance and Sexual Activity   Alcohol use: Yes    Comment: occassionally on weekends   Drug use: Yes    Types:  Marijuana    Comment: multiple times a day   Sexual activity: Yes    Partners: Male    Birth control/protection: Condom

## 2022-01-17 ENCOUNTER — Encounter: Payer: Self-pay | Admitting: Gastroenterology

## 2022-01-17 ENCOUNTER — Ambulatory Visit (INDEPENDENT_AMBULATORY_CARE_PROVIDER_SITE_OTHER): Payer: Self-pay | Admitting: Gastroenterology

## 2022-01-17 ENCOUNTER — Ambulatory Visit (INDEPENDENT_AMBULATORY_CARE_PROVIDER_SITE_OTHER): Payer: No Typology Code available for payment source | Admitting: Gastroenterology

## 2022-01-17 VITALS — BP 116/76 | HR 102 | Temp 98.5°F | Ht 62.0 in | Wt 134.0 lb

## 2022-01-17 VITALS — BP 116/76 | HR 102 | Temp 98.5°F | Wt 134.0 lb

## 2022-01-17 DIAGNOSIS — K625 Hemorrhage of anus and rectum: Secondary | ICD-10-CM

## 2022-01-17 DIAGNOSIS — K641 Second degree hemorrhoids: Secondary | ICD-10-CM

## 2022-01-17 NOTE — Progress Notes (Signed)
Primary Care Physician: Ladell Pier, MD  Primary Gastroenterologist:  Dr. Lucilla Lame  Chief Complaint  Patient presents with   Follow-up    HPI: Katie Benson is a 41 y.o. female here who was being followed at [facility] with not being happy there so she transferred to my practice.  Patient had an EGD and colonoscopy by her previous gastroenterologist and then came to see me in June.  The patient had signs and symptoms consistent with irritable bowel syndrome and was started on an antispasmodic and dicyclomine.  The patient states that it caused her side effects and then could not take the medication anymore.  September the patient sent over some information for paperwork to be filled out for social services. The patient reports that she had to stop the hyoscyamine because it caused her to have dry eyes which caused her to scratch her eyes and get a corneal abrasion.  She does report that the desipramine before she goes to bed has improved her symptoms greatly but she still has intermittent symptoms.  The patient continues to have rectal bleeding from her hemorrhoids that were found with her colonoscopy from her previous gastroenterologist.  Past Medical History:  Diagnosis Date   Anemia    Arthritis    Fibroids    GERD (gastroesophageal reflux disease)    History of kidney stones    Neck injury     Current Outpatient Medications  Medication Sig Dispense Refill   AMBULATORY NON FORMULARY MEDICATION Medication Name:  Nitroglycerin ointment 0.125% three times daily use pea sized amount per rectum for 6-8 weeks 30 g 1   cyclobenzaprine (FLEXERIL) 10 MG tablet Take 1 tablet (10 mg total) by mouth at bedtime. 30 tablet 2   desipramine (NORPRAMIN) 25 MG tablet take 1 tab by mouth at bedtime 30 tablet 5   Docusate Calcium (STOOL SOFTENER PO) Take by mouth.     gabapentin (NEURONTIN) 100 MG capsule Take 1 capsule (100 mg total) by mouth 3 (three) times daily. 90 capsule 1    hydrocortisone (ANUSOL-HC) 2.5 % rectal cream Place 1 application. rectally 2 (two) times daily as needed for hemorrhoids or anal itching. 30 g 0   hyoscyamine (LEVBID) 0.375 MG 12 hr tablet Take 1 tabley by mouth twice daily 60 tablet 5   lidocaine (LIDODERM) 5 % Apply 2 patches topically daily as needed. Leave patch on for maximum duration of 12 hours.  Do not place 2 patches on one area within 24 hours 30 patch 1   Lidocaine, Anorectal, (RECTICARE) 5 % CREA Apply 1 Application topically every 4 (four) hours as needed. 28 g 1   meloxicam (MOBIC) 15 MG tablet Take 1 tablet (15 mg total) by mouth daily. 30 tablet 1   methocarbamol (ROBAXIN) 500 MG tablet Take 1 tablet (500 mg total) by mouth 3 (three) times daily. 90 tablet 0   pantoprazole (PROTONIX) 40 MG tablet Take 1 tablet (40 mg total) by mouth daily. 90 tablet 3   Urea (UREA NAIL) 45 % GEL Apply 1 Application topically daily. 28 mL 1   Current Facility-Administered Medications  Medication Dose Route Frequency Provider Last Rate Last Admin   0.9 %  sodium chloride infusion  500 mL Intravenous Once Mauri Pole, MD        Allergies as of 01/17/2022 - Review Complete 01/17/2022  Allergen Reaction Noted   Pork-derived products Hives and Swelling 03/08/2012    ROS:  General: Negative for anorexia,  weight loss, fever, chills, fatigue, weakness. ENT: Negative for hoarseness, difficulty swallowing , nasal congestion. CV: Negative for chest pain, angina, palpitations, dyspnea on exertion, peripheral edema.  Respiratory: Negative for dyspnea at rest, dyspnea on exertion, cough, sputum, wheezing.  GI: See history of present illness. GU:  Negative for dysuria, hematuria, urinary incontinence, urinary frequency, nocturnal urination.  Endo: Negative for unusual weight change.    Physical Examination:   BP 116/76 (BP Location: Right Arm, Patient Position: Sitting, Cuff Size: Normal)   Pulse (!) 102   Temp 98.5 F (36.9 C) (Oral)    Wt 134 lb (60.8 kg)   BMI 24.51 kg/m   General: Well-nourished, well-developed in no acute distress.  Eyes: No icterus. Conjunctivae pink. Neuro: Alert and oriented x 3.  Grossly intact. Skin: Warm and dry, no jaundice.   Psych: Alert and cooperative, normal mood and affect.  Labs:    Imaging Studies: No results found.  Assessment and Plan:   [name] is a 41 y.o. y/o female who comes in today with improving symptoms of irritable bowel.  She will stay off the [place] since she had side effects.  The patient states that she is doing much better on the desipramine and I have told her to increase the desipramine to 50 mg at bedtime to see if that helps her symptoms even more.  She has been told that if it does not or gives her more side effects she should go back down to the 25 mg.  She has also been set up with Dr. Vicente Males for hemorrhoidal banding today due to her rectal bleeding.  The patient has been explained the plan and agrees with it.     Lucilla Lame, MD. [facility]al    Note: This dictation was prepared with Dragon dictation along with smaller phrase technology. Any transcriptional errors that result from this process are unintentional.

## 2022-01-17 NOTE — Progress Notes (Signed)
Patient follow-ups today for banding of hemorrhoids    Summary of history :  She is here today to see me for hemorrhoidal banding.  Had seen Dr. Verl Blalock earlier today for history of rectal bleeding has had a colonoscopy by Dr. Silverio Decamp in May 2022 that showed internal hemorrhoids internal and external.  Treated with conservative management failed the plan was for hemorrhoidal banding but never received an appointment hence transferred care to our practice and wishes to proceed with hemorrhoidal banding today.    Digital rectal exam performed in the presence of a chaperone. External anal findings: none  Internal findings: , No masses, no blood on glove noticed.    PROCEDURE NOTE: The patient presents with symptomatic grade 1 hemorrhoids, unresponsive to maximal medical therapy, requesting rubber band ligation of his/her hemorrhoidal disease.  All risks, benefits and alternative forms of therapy were described and informed consent was obtained.  In the Left Lateral Decubitus position (if anoscopy is performed) anoscopic examination revealed grade 1 hemorrhoids in the all position(s).   The decision was made to band the LL internal hemorrhoid, and the [place] was used to perform band ligation without complication.  Digital anorectal examination was then performed to assure proper positioning of the band, and to adjust the banded tissue as required.  The patient was discharged home without pain or other issues.  Dietary and behavioral recommendations were given and (if necessary - prescriptions were given), along with follow-up instructions.  The patient will return 4 weeks for follow-up and possible additional banding as required.  No complications were encountered and the patient tolerated the procedure well.   Plan:  If develops pain please return to the office tomorrow  Follow-up: 4 weeks  Dr Jonathon Bellows MD,MRCP [facility]) Gastroenterology/Hepatology Pager: [phone]

## 2022-01-23 ENCOUNTER — Encounter: Payer: Self-pay | Admitting: Family Medicine

## 2022-01-23 ENCOUNTER — Ambulatory Visit: Payer: Self-pay | Admitting: Family Medicine

## 2022-01-24 ENCOUNTER — Other Ambulatory Visit: Payer: Self-pay | Admitting: Internal Medicine

## 2022-01-24 ENCOUNTER — Other Ambulatory Visit: Payer: Self-pay

## 2022-01-24 DIAGNOSIS — M1712 Unilateral primary osteoarthritis, left knee: Secondary | ICD-10-CM

## 2022-01-24 MED ORDER — MELOXICAM 15 MG PO TABS
15.0000 mg | ORAL_TABLET | Freq: Every day | ORAL | 0 refills | Status: DC
  Filled 2022-01-31: qty 30, 30d supply, fill #0

## 2022-01-24 NOTE — Telephone Encounter (Signed)
Just an FYI

## 2022-01-26 ENCOUNTER — Other Ambulatory Visit: Payer: Self-pay

## 2022-01-27 ENCOUNTER — Other Ambulatory Visit: Payer: Self-pay

## 2022-01-30 ENCOUNTER — Other Ambulatory Visit: Payer: Self-pay

## 2022-01-30 ENCOUNTER — Other Ambulatory Visit: Payer: Self-pay | Admitting: Surgical

## 2022-01-30 MED ORDER — GABAPENTIN 100 MG PO CAPS
100.0000 mg | ORAL_CAPSULE | Freq: Three times a day (TID) | ORAL | 1 refills | Status: DC
  Filled 2022-01-30 – 2022-03-15 (×3): qty 90, 30d supply, fill #0
  Filled 2022-04-07: qty 90, 30d supply, fill #1

## 2022-01-31 ENCOUNTER — Other Ambulatory Visit: Payer: Self-pay

## 2022-01-31 ENCOUNTER — Telehealth: Payer: Self-pay

## 2022-01-31 MED ORDER — DESIPRAMINE HCL 50 MG PO TABS
50.0000 mg | ORAL_TABLET | Freq: Every day | ORAL | 0 refills | Status: DC
  Filled 2022-01-31: qty 30, 30d supply, fill #0

## 2022-01-31 NOTE — Telephone Encounter (Signed)
Pharmacist called stating that the pt is there to pick up refill of prescription, but was unable to because it was too soon... at last OV Dr Allen Norris advised pt to dble Desipramine to '50mg'$  from '25mg'$  to see how she does with the dose. Pharmacist states pt is "cursing" them out...  I called pt to see if I could calm her down and get an update on how long she had been taking new dose and to see how she was doing.... She answered the phone and immediately referred to the pharmacy staff as idiots... I tried to explain to the pt that the pharmacy would have no info about the dose change as we did not send in new Rx... She cut me off numerous times and asked me if I was going to be quiet to allow her to talk... I again tried to ask pt how long she had been taking the new dose and how she was doing, she interrupted saying that she had already spoken to Dr Allen Norris about this at her visit... I tried again to get an update and explain why the pharmacy would not know...  Pt finally let me know that she had only been taking '50mg'$  for 4-5 days and has not noticed any changes and has no concerns... I let pt know that I needed to get VO from Dr Allen Norris before sending in new Rx, she expressed understanding  I called Dr Allen Norris and he gave VO to send in 30 day supply of Desipramine 50 mg...  Pt is aware that she will need to contact me to give an update before the next fill is due

## 2022-02-01 ENCOUNTER — Other Ambulatory Visit: Payer: Self-pay

## 2022-02-02 ENCOUNTER — Other Ambulatory Visit: Payer: Self-pay

## 2022-02-06 ENCOUNTER — Ambulatory Visit: Payer: Medicaid Other | Admitting: Sports Medicine

## 2022-02-09 ENCOUNTER — Ambulatory Visit: Payer: No Typology Code available for payment source | Admitting: Orthopedic Surgery

## 2022-02-09 ENCOUNTER — Other Ambulatory Visit: Payer: Self-pay

## 2022-02-16 ENCOUNTER — Telehealth: Payer: Self-pay

## 2022-02-16 ENCOUNTER — Ambulatory Visit (INDEPENDENT_AMBULATORY_CARE_PROVIDER_SITE_OTHER): Payer: Medicaid Other | Admitting: Gastroenterology

## 2022-02-16 ENCOUNTER — Encounter: Payer: Self-pay | Admitting: Gastroenterology

## 2022-02-16 VITALS — BP 126/85 | HR 86 | Temp 99.1°F | Wt 131.6 lb

## 2022-02-16 DIAGNOSIS — K640 First degree hemorrhoids: Secondary | ICD-10-CM | POA: Diagnosis not present

## 2022-02-16 DIAGNOSIS — K641 Second degree hemorrhoids: Secondary | ICD-10-CM | POA: Diagnosis not present

## 2022-02-16 NOTE — Progress Notes (Signed)
Patient follow-ups today for banding of hemorrhoids    Summary of history :  She is here today to see me for hemorrhoidal banding. Had seen Dr. Verl Blalock earlier today for history of rectal bleeding has had a colonoscopy by Dr. Silverio Decamp in May 2022 that showed internal hemorrhoids internal and external. Treated with conservative management failed the plan was for hemorrhoidal banding but never received an appointment hence transferred care to our practice and wishes to proceed with hemorrhoidal banding today.     First round:01/17/2022: LL column banded   Interval history   01/17/2022-02/16/2022 Doing very well significant improvement in the rectal bleeding and the discharge.   Digital rectal exam performed in the presence of a chaperone. External anal findings: Normal Internal findings: Normal, No masses, no blood on glove noticed.    PROCEDURE NOTE: The patient presents with symptomatic grade 1 hemorrhoids, unresponsive to maximal medical therapy, requesting rubber band ligation of his/her hemorrhoidal disease.  All risks, benefits and alternative forms of therapy were described and informed consent was obtained.  In the Left Lateral Decubitus position (if anoscopy is performed) anoscopic examination revealed grade 1 hemorrhoids in the RA and RP position(s).   The decision was made to band the RA internal hemorrhoid, and the [place] was used to perform band ligation without complication.  Digital anorectal examination was then performed to assure proper positioning of the band, and to adjust the banded tissue as required.  The patient was discharged home without pain or other issues.  Dietary and behavioral recommendations were given and (if necessary - prescriptions were given), along with follow-up instructions.  The patient will return 4 weeks for follow-up and possible additional banding as required.  No complications were encountered and the patient tolerated the procedure  well.   Plan:  Avoid constipation.    Follow-up:4 weeks  Dr Jonathon Bellows MD,MRCP [facility]) Gastroenterology/Hepatology Pager: [phone]

## 2022-02-16 NOTE — Telephone Encounter (Signed)
Pt has taken Desipramine '50mg'$  for 2 more weeks (increased from '25mg'$  at last OV) and report she is doing well... Please advise if okay to send in refills

## 2022-02-17 ENCOUNTER — Other Ambulatory Visit: Payer: Self-pay

## 2022-02-17 MED ORDER — DESIPRAMINE HCL 50 MG PO TABS
50.0000 mg | ORAL_TABLET | Freq: Every day | ORAL | 2 refills | Status: DC
  Filled 2022-02-17 – 2022-03-15 (×3): qty 90, 90d supply, fill #0
  Filled 2022-04-07 – 2022-06-21 (×4): qty 90, 90d supply, fill #1
  Filled 2022-10-11: qty 90, 90d supply, fill #2

## 2022-02-17 NOTE — Addendum Note (Signed)
Addended by: Lurlean Nanny on: 02/17/2022 08:33 AM   Modules accepted: Orders

## 2022-03-06 ENCOUNTER — Ambulatory Visit (INDEPENDENT_AMBULATORY_CARE_PROVIDER_SITE_OTHER): Payer: Medicaid Other | Admitting: Podiatry

## 2022-03-06 DIAGNOSIS — L600 Ingrowing nail: Secondary | ICD-10-CM

## 2022-03-06 NOTE — Patient Instructions (Signed)

## 2022-03-06 NOTE — Progress Notes (Unsigned)
Subjective: Chief Complaint  Patient presents with   Ingrown Toenail    Left foot, 5th digit   42 year old female presents the office today with the above concerns.  She states he is continue to get pain if she has to trim the nail, lateral aspect of the left fifth toe is causing pain.  She wants to discuss pain options.  No drainage or pus.  Objective: AAO x3, NAD DP/PT pulses palpable bilaterally, CRT less than 3 seconds On the lateral aspect the left fifth toe the nail is formation adjacent to the toenail as well this is causing pain.  There is no edema, erythema.  No drainage or pus today.  Adductovarus present. No pain with calf compression, swelling, warmth, erythema  Assessment: 42 year old female with ingrown toenail, corn left 5th toe  Plan: -All treatment options discussed with the patient including all alternatives, risks, complications.  -We discussed the conservative as well as surgical treatment options.  Ultimately reported proceed with a partial nail avulsion of the left lateral fifth nail borders today.  I discussed with her possible surgical intervention for derotational arthroplasty of the fifth digit should this not work.  Discussed with her that symptoms may persist or worsen potentially but hopefully this will help decrease her pain and avoid surgery. -At this time, the patient is requesting partial nail removal with chemical matricectomy to the symptomatic portion of the nail. Risks and complications were discussed with the patient for which they understand and written consent was obtained. Under sterile conditions a total of 3 mL of a mixture of 2% lidocaine plain and 0.5% Marcaine plain was infiltrated in a digital block fashion. Once anesthetized, the skin was prepped in sterile fashion. A tourniquet was then applied. Next the lateral aspect of the fifth digit nail border was then sharply excised making sure to remove the entire offending nail border. Once the nails were  ensured to be removed area was debrided and the underlying skin was intact. There is no purulence identified in the procedure. Next phenol was then applied under standard conditions and copiously irrigated.  Silvadene was applied. A dry sterile dressing was applied. After application of the dressing the tourniquet was removed and there is found to be an immediate capillary refill time to the digit. The patient tolerated the procedure well any complications. Post procedure instructions were discussed the patient for which he verbally understood. Discussed signs/symptoms of infection and directed to call the office immediately should any occur or go directly to the emergency room. In the meantime, encouraged to call the office with any questions, concerns, changes symptoms. -Patient encouraged to call the office with any questions, concerns, change in symptoms.   Trula Slade DPM

## 2022-03-07 ENCOUNTER — Other Ambulatory Visit: Payer: Self-pay | Admitting: Surgical

## 2022-03-08 ENCOUNTER — Other Ambulatory Visit: Payer: Self-pay

## 2022-03-08 MED ORDER — LIDOCAINE 5 % EX PTCH
2.0000 | MEDICATED_PATCH | Freq: Every day | CUTANEOUS | 1 refills | Status: AC | PRN
  Filled 2022-03-08 – 2022-03-09 (×2): qty 30, 15d supply, fill #0

## 2022-03-08 NOTE — Telephone Encounter (Signed)
Ok to rf pls calal thx

## 2022-03-09 ENCOUNTER — Other Ambulatory Visit: Payer: Self-pay

## 2022-03-10 ENCOUNTER — Other Ambulatory Visit: Payer: Self-pay

## 2022-03-14 ENCOUNTER — Other Ambulatory Visit: Payer: Self-pay

## 2022-03-15 ENCOUNTER — Other Ambulatory Visit: Payer: Self-pay

## 2022-03-16 ENCOUNTER — Other Ambulatory Visit: Payer: Self-pay

## 2022-03-16 ENCOUNTER — Ambulatory Visit (INDEPENDENT_AMBULATORY_CARE_PROVIDER_SITE_OTHER): Payer: Medicaid Other | Admitting: Gastroenterology

## 2022-03-16 ENCOUNTER — Encounter: Payer: Self-pay | Admitting: Gastroenterology

## 2022-03-16 VITALS — BP 117/82 | HR 81 | Temp 98.4°F | Ht 62.0 in | Wt 134.2 lb

## 2022-03-16 DIAGNOSIS — K640 First degree hemorrhoids: Secondary | ICD-10-CM

## 2022-03-16 NOTE — Progress Notes (Signed)
Patient follow-ups today for banding of hemorrhoids    Summary of history :    She is here today to see me for hemorrhoidal banding. Had seen Dr. Verl Blalock earlier today for history of rectal bleeding has had a colonoscopy by Dr. Silverio Decamp in May 2022 that showed internal hemorrhoids internal and external. Treated with conservative management failed the plan was for hemorrhoidal banding but never received an appointment hence transferred care to our practice and wishes to proceed with hemorrhoidal banding today.        First round:01/17/2022: LL column banded Second round: 02/16/2022: RA column banded    Interval history   02/16/2022-03/16/2022  Doing well since last visit     Digital rectal exam performed in the presence of a chaperone. External anal findings:  Internal findings: , No masses, no blood on glove noticed.    PROCEDURE NOTE: The patient presents with symptomatic grade 1 hemorrhoids, unresponsive to maximal medical therapy, requesting rubber band ligation of his/her hemorrhoidal disease.  All risks, benefits and alternative forms of therapy were described and informed consent was obtained.  In the Left Lateral Decubitus position (if anoscopy is performed) anoscopic examination revealed grade 1 hemorrhoids in the RP position(s).   The decision was made to band the RP internal hemorrhoid, and the [place] was used to perform band ligation without complication.  Digital anorectal examination was then performed to assure proper positioning of the band, and to adjust the banded tissue as required.  The patient was discharged home without pain or other issues.  Dietary and behavioral recommendations were given and (if necessary - prescriptions were given), along with follow-up instructions.  The patient will return as needed for follow-up and possible additional banding as required.  No complications were encountered and the patient tolerated the procedure  well.   Plan:  Avoid constipation.  Commence on stool softeners if not already on  Follow-up:as needed  Dr Jonathon Bellows MD,MRCP [facility]) Gastroenterology/Hepatology Pager: [phone]

## 2022-03-17 ENCOUNTER — Other Ambulatory Visit: Payer: Self-pay

## 2022-03-21 ENCOUNTER — Other Ambulatory Visit: Payer: Self-pay

## 2022-03-21 ENCOUNTER — Ambulatory Visit (INDEPENDENT_AMBULATORY_CARE_PROVIDER_SITE_OTHER): Payer: Medicaid Other

## 2022-03-21 DIAGNOSIS — L600 Ingrowing nail: Secondary | ICD-10-CM

## 2022-03-21 NOTE — Progress Notes (Signed)
Patient in office after ingrown toenail removal of the 5th digit left foot.   Advised patient to continue soaking in epsom salts as needed followed by antibiotic ointment and a band-aid. Can leave uncovered at night. Continue this until completely healed.  If the area has not healed in 2 weeks, call the office for follow-up appointment, or sooner if any problems arise. Silicone toe cap was dispensed for comfort.  Monitor for any signs/symptoms of infection. Call the office immediately if any occur or go directly to the emergency room. Call with any questions/concerns.

## 2022-03-29 ENCOUNTER — Other Ambulatory Visit: Payer: Self-pay

## 2022-04-03 ENCOUNTER — Other Ambulatory Visit: Payer: Self-pay

## 2022-04-07 ENCOUNTER — Other Ambulatory Visit: Payer: Self-pay

## 2022-04-10 ENCOUNTER — Other Ambulatory Visit: Payer: Self-pay

## 2022-04-14 ENCOUNTER — Other Ambulatory Visit: Payer: Self-pay

## 2022-04-25 ENCOUNTER — Ambulatory Visit (INDEPENDENT_AMBULATORY_CARE_PROVIDER_SITE_OTHER): Payer: Medicaid Other | Admitting: Podiatry

## 2022-04-25 DIAGNOSIS — L600 Ingrowing nail: Secondary | ICD-10-CM

## 2022-05-03 ENCOUNTER — Ambulatory Visit (INDEPENDENT_AMBULATORY_CARE_PROVIDER_SITE_OTHER): Payer: Medicaid Other | Admitting: Surgical

## 2022-05-03 ENCOUNTER — Encounter: Payer: Self-pay | Admitting: Surgical

## 2022-05-03 ENCOUNTER — Telehealth: Payer: Self-pay

## 2022-05-03 DIAGNOSIS — M5412 Radiculopathy, cervical region: Secondary | ICD-10-CM

## 2022-05-03 DIAGNOSIS — M1712 Unilateral primary osteoarthritis, left knee: Secondary | ICD-10-CM | POA: Diagnosis not present

## 2022-05-03 MED ORDER — CYCLOBENZAPRINE HCL 10 MG PO TABS: 10.0000 mg | ORAL_TABLET | Freq: Three times a day (TID) | ORAL | 2 refills | Status: DC

## 2022-05-03 NOTE — Telephone Encounter (Signed)
Please get auth for left knee gel injection-luke pt

## 2022-05-03 NOTE — Progress Notes (Signed)
Subjective: Chief Complaint  Patient presents with   Ingrown Toenail    Rm 13 Nail check of left 5th toe. Pt improvement in tenderness. Scab is forming.    42 year old female presents the office today with the above concerns.  She states that it is healing getting better as The swelling.  There is decreased tenderness.  She has not seen any swelling or redness or any drainage.  No fevers or chills.  No other concerns.   Objective: AAO x3, NAD DP/PT pulses palpable bilaterally, CRT less than 3 seconds Procedure site appears to be healing.  There is still 1 small scab present but there is no drainage or pus.  There is no fluctuance or crepitation.  There is no malodor.  No significant pain on exam.  Adductovarus is present of the toe. No pain with calf compression, swelling, warmth, erythema  Assessment: 42 year old female with ingrown toenail, corn left 5th toe  Plan: -All treatment options discussed with the patient including all alternatives, risks, complications.  -Overall the procedure site appears to be healing well.  Discussed antibiotic ointment and a bandage during the day but can leave the area open at nighttime.  Monitor for any signs or symptoms of infection or reoccurrence.  Offloading.  Return if symptoms worsen or fail to improve.  Trula Slade DPM

## 2022-05-04 ENCOUNTER — Other Ambulatory Visit: Payer: Self-pay

## 2022-05-04 ENCOUNTER — Other Ambulatory Visit: Payer: Self-pay | Admitting: Surgical

## 2022-05-04 ENCOUNTER — Other Ambulatory Visit: Payer: Self-pay | Admitting: Physical Medicine and Rehabilitation

## 2022-05-04 DIAGNOSIS — M1712 Unilateral primary osteoarthritis, left knee: Secondary | ICD-10-CM | POA: Diagnosis not present

## 2022-05-04 DIAGNOSIS — M5412 Radiculopathy, cervical region: Secondary | ICD-10-CM | POA: Diagnosis not present

## 2022-05-04 MED ORDER — GABAPENTIN 300 MG PO CAPS
300.0000 mg | ORAL_CAPSULE | Freq: Three times a day (TID) | ORAL | 1 refills | Status: DC
  Filled 2022-05-04: qty 90, 30d supply, fill #0
  Filled 2022-05-30: qty 90, 30d supply, fill #1

## 2022-05-04 MED ORDER — ZOLPIDEM TARTRATE 10 MG PO TABS: ORAL_TABLET | ORAL | 0 refills | Status: DC

## 2022-05-05 ENCOUNTER — Other Ambulatory Visit: Payer: Self-pay

## 2022-05-05 ENCOUNTER — Encounter: Payer: Self-pay | Admitting: Surgical

## 2022-05-05 ENCOUNTER — Other Ambulatory Visit: Payer: Self-pay | Admitting: Surgical

## 2022-05-05 MED ORDER — CYCLOBENZAPRINE HCL 10 MG PO TABS
10.0000 mg | ORAL_TABLET | Freq: Three times a day (TID) | ORAL | 1 refills | Status: DC
  Filled 2022-05-05: qty 90, 30d supply, fill #0
  Filled 2022-05-30: qty 90, 30d supply, fill #1

## 2022-05-05 MED ORDER — BUPIVACAINE HCL 0.25 % IJ SOLN
4.0000 mL | INTRAMUSCULAR | Status: AC | PRN
  Administered 2022-05-03: 4 mL via INTRA_ARTICULAR

## 2022-05-05 MED ORDER — METHYLPREDNISOLONE ACETATE 40 MG/ML IJ SUSP
40.0000 mg | INTRAMUSCULAR | Status: AC | PRN
  Administered 2022-05-03: 40 mg via INTRA_ARTICULAR

## 2022-05-05 MED ORDER — LIDOCAINE HCL 1 % IJ SOLN
5.0000 mL | INTRAMUSCULAR | Status: AC | PRN
  Administered 2022-05-03: 5 mL

## 2022-05-05 NOTE — Progress Notes (Cosign Needed Addendum)
Office Visit Note   Patient: Katie Benson           Date of Birth: 1980/08/06           MRN: [id number] Visit Date: 05/03/2022 Requested by: Ladell Pier, MD [place],  [place] [id number] PCP: Ladell Pier, MD  Subjective: Chief Complaint  Patient presents with   Left Knee - Pain   Right Hand - Numbness    HPI: Katie Benson is a 42 y.o. female who presents to the office reporting left knee pain.  She also complains of numbness in both of her hands on the dorsal and palmar side.  She has noticed increased neck pain and scapular pain in both sides.  She has history of left knee osteoarthritis with good relief from prior injections.  She takes Flexeril 3 times daily and gabapentin 300 mg at night.  She has tried Tylenol which has not really given her any significant relief.              ROS: All systems reviewed are negative as they relate to the chief complaint within the history of present illness.  Patient denies fevers or chills.  Assessment & Plan: Visit Diagnoses:  1. Cervical radiculopathy     Plan: Patient is a 42 year old female who presents for evaluation of left knee pain and numbness and tingling in bilateral hands.  She has history of prior MRI scan of the cervical spine from 2022 demonstrating congenitally short pedicles with moderate impingement at C4-C5, C5-C6 with more mild findings at other levels.  She has had good relief from prior cervical spine ESI.  She would like to repeat this in the near future.  Plan to refer patient to Dr. Ernestina Patches for C-spine ESI.  Also plan to administer left knee cortisone injection today.  5 cc was aspirated from the left knee prior to injection.  She tolerated procedure well without any complications.  We will preapproved her for left knee gel injection.  Refilled her Flexeril and prescribed gabapentin 300 mg 3 times daily.  Follow-up for gel injection. This patient is diagnosed with  osteoarthritis of the knee(s).    Radiographs show evidence of joint space narrowing, osteophytes, subchondral sclerosis and/or subchondral cysts.  This patient has knee pain which interferes with functional and activities of daily living.    This patient has experienced inadequate response, adverse effects and/or intolerance with conservative treatments such as acetaminophen, NSAIDS, topical creams, physical therapy or regular exercise, knee bracing and/or weight loss.   This patient has experienced inadequate response or has a contraindication to intra articular steroid injections for at least 3 months.   This patient is not scheduled to have a total knee replacement within 6 months of starting treatment with viscosupplementation.    Follow-Up Instructions: No follow-ups on file.   Orders:  Orders Placed This Encounter  Procedures   Ambulatory referral to Physical Medicine Rehab   Meds ordered this encounter  Medications   DISCONTD: cyclobenzaprine (FLEXERIL) 10 MG tablet    Sig: Take 1 tablet (10 mg total) by mouth 3 (three) times daily.    Dispense:  30 tablet    Refill:  2      Procedures: Large Joint Inj: L knee on 05/03/2022 12:29 PM Indications: diagnostic evaluation, joint swelling and pain Details: 18 G 1.5 in needle, superolateral approach  Arthrogram: No  Medications: 5 mL lidocaine 1 %; 40 mg methylPREDNISolone acetate 40 MG/ML;  4 mL bupivacaine 0.25 % Aspirate: 5 mL Outcome: tolerated well, no immediate complications Procedure, treatment alternatives, risks and benefits explained, specific risks discussed. Consent was given by the patient. Immediately prior to procedure a time out was called to verify the correct patient, procedure, equipment, support staff and site/side marked as required. Patient was prepped and draped in the usual sterile fashion.       Clinical Data: No additional findings.  Objective: Vital Signs: There were no vitals taken for this  visit.  Physical Exam:  Constitutional: Patient appears well-developed HEENT:  Head: Normocephalic Eyes:EOM are normal Neck: Normal range of motion Cardiovascular: Normal rate Pulmonary/chest: Effort normal Neurologic: Patient is alert Skin: Skin is warm Psychiatric: Patient has normal mood and affect  Ortho Exam: Ortho exam demonstrates left knee with small effusion.  Tenderness over the medial and lateral joint lines, primarily over the lateral joint line.  No pain with hip range of motion.  Able to form straight leg raise without extensor lag.  No cellulitis or skin changes noted.  3 degrees of extension of the left knee.  105 degrees of knee flexion which is limited due to pain.  5/5 motor strength of bilateral grip strength, finger abduction, pronation/supination, bicep, tricep, deltoid.  No pain with shoulder range of motion bilaterally.  Excellent rotator cuff strength of supra, infra, subscap.  Specialty Comments:  MRI CERVICAL SPINE WITHOUT CONTRAST   TECHNIQUE: Multiplanar, multisequence MR imaging of the cervical spine was performed. No intravenous contrast was administered.   COMPARISON:  CT scan 12/26/2012   FINDINGS: Alignment: No vertebral subluxation is observed.   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 Disc desiccation at all levels between C2 and C7.   Cord: No significant abnormal spinal cord signal is observed.   Posterior Fossa, vertebral arteries, paraspinal tissues: Unremarkable   Disc levels:   C2-3: Unremarkable.   C3-4: Borderline bilateral foraminal stenosis due to disc bulge and uncinate spurring.   C4-5: Moderate to prominent bilateral foraminal stenosis and borderline central narrowing of the thecal sac due to disc bulge and uncinate spurring.   C5-6: Moderate right and moderate to prominent left foraminal stenosis and borderline central narrowing of the thecal sac due to disc bulge and uncinate spurring.    C6-7: Mild right and moderate left foraminal stenosis due to disc bulge and uncinate spurring.   C7-T1: No impingement.  Minimal uncinate spurring.   T1-2: Unremarkable.   IMPRESSION: 1. Cervical spondylosis, degenerative disc disease, and mildly congenitally short pedicles contribute to moderate to prominent impingement at C4-5; moderate impingement at C5-6; and mild impingement at C6-7, as detailed above.     Electronically Signed   By: Van Clines M.D.   On: 08/01/2020 09:25  ---------------------------------------------- EXAM: MRI LUMBAR SPINE WITHOUT CONTRAST   TECHNIQUE: Multiplanar, multisequence MR imaging of the lumbar spine was performed. No intravenous contrast was administered.   COMPARISON:  Lumbar spine x-rays dated June 23, 2021. CT abdomen pelvis dated March 26, 2020.   FINDINGS: Segmentation:  Standard.   Alignment:  Physiologic.   Vertebrae:  No fracture, evidence of discitis, or bone lesion.   Conus medullaris and cauda equina: Conus extends to the L1-L2 level. Conus and cauda equina appear normal.   Paraspinal and other soft tissues: Negative.   Disc levels:   T11-T12 to L2-L3: Negative.   L3-L4: Mild disc bulging and bilateral facet arthropathy. Mild bilateral neuroforaminal stenosis. No spinal canal stenosis.   L4-L5: Negative disc. Mild bilateral facet  arthropathy. Mild bilateral neuroforaminal stenosis. No spinal canal stenosis.   L5-S1: Negative disc. Mild bilateral facet arthropathy. No stenosis.   IMPRESSION: 1. Mild degenerative changes of the lower lumbar spine as described above. No high-grade stenosis or impingement.     Electronically Signed   By: Titus Dubin M.D.   On: 08/19/2021 16:36  Imaging: No results found.   PMFS History: Patient Active Problem List   Diagnosis Date Noted   History of esophageal ulcer 12/22/2021   Ingrown nail of fifth toe 12/22/2021   Chronic toe pain, left foot 12/22/2021    Blurry vision, bilateral 12/22/2021   Left corneal abrasion 12/22/2021   Osteoarthritis of left knee 12/14/2020   Subacromial bursitis of right shoulder joint 07/15/2020   Cervical radiculopathy 06/24/2020   Effusion, left knee 06/24/2019   Pes anserine bursitis 04/29/2019   Bucket handle tear of lateral meniscus of left knee 12/12/2018   Ganglion cyst 12/12/2018   Fibroids 03/18/2018   S/P parathyroidectomy ([place]) 02/12/2018   Primary hyperparathyroidism ([place]) 01/08/2018   Hyperparathyroidism , secondary, non-renal ([place]) 01/08/2018   Fibroid uterus 12/06/2017   Hypercalcemia 12/06/2017   Fibroid 10/15/2017   Abnormal uterine bleeding (AUB) 08/20/2017   Anemia 08/20/2017   Past Medical History:  Diagnosis Date   Anemia    Arthritis    Fibroids    GERD (gastroesophageal reflux disease)    History of kidney stones    Neck injury     Family History  Problem Relation Age of Onset   Diabetes Sister    Cancer Maternal Grandmother        ovarian   Hypercalcemia Mother    Other Father        HX Unknown    Past Surgical History:  Procedure Laterality Date   CESAREAN SECTION     IR ANGIOGRAM PELVIS SELECTIVE OR SUPRASELECTIVE  03/18/2018   IR ANGIOGRAM PELVIS SELECTIVE OR SUPRASELECTIVE  03/18/2018   IR ANGIOGRAM SELECTIVE EACH ADDITIONAL VESSEL  03/18/2018   IR ANGIOGRAM SELECTIVE EACH ADDITIONAL VESSEL  03/18/2018   IR EMBO TUMOR ORGAN ISCHEMIA INFARCT INC GUIDE ROADMAPPING  03/18/2018   IR RADIOLOGIST EVAL & MGMT  10/17/2017   IR RADIOLOGIST EVAL & MGMT  12/20/2017   IR RADIOLOGIST EVAL & MGMT  04/17/2018   IR US GUIDE VASC ACCESS RIGHT  03/18/2018   KNEE ARTHROSCOPY WITH LATERAL MENISECTOMY Left 12/24/2018   Procedure: LEFT KNEE ARTHROSCOPY WITH LATERAL MENISECTOM, debriment of GANGLION CYST;  Surgeon: [name], MD;  Location: [facility];  Service: Orthopedics;  Laterality: Left;   PARATHYROIDECTOMY N/A 02/12/2018   Procedure: PARATHYROIDECTOMY;  Surgeon: Fredirick Maudlin, MD;  Location: [facility] ORS;  Service: General;  Laterality: N/A;   WISDOM TOOTH EXTRACTION     age 74   Social History   Occupational History   Occupation: unemployed  Tobacco Use   Smoking status: Some Days    Packs/day: 0.25    Years: 14.00    Total pack years: 3.50    Types: Cigarettes   Smokeless tobacco: Never  Vaping Use   Vaping Use: Never used  Substance and Sexual Activity   Alcohol use: Yes    Comment: occassionally on weekends   Drug use: Yes    Types: Marijuana    Comment: multiple times a day   Sexual activity: Yes    Partners: Male    Birth control/protection: Condom

## 2022-05-05 NOTE — Telephone Encounter (Signed)
Sent in. It was sent on 3/6 but looks like it went to the wrong pharmacy. Resent it in today

## 2022-05-05 NOTE — Telephone Encounter (Signed)
Talked with patient and advised her that Medicaid does not cover gel injections and that [facility] no longer takes Medicaid patients.  Patient voiced that she understands.  I did advise about TriVisc, but patient declined.

## 2022-05-15 ENCOUNTER — Other Ambulatory Visit: Payer: Self-pay

## 2022-05-17 ENCOUNTER — Other Ambulatory Visit: Payer: Self-pay | Admitting: Physical Medicine and Rehabilitation

## 2022-05-17 ENCOUNTER — Other Ambulatory Visit: Payer: Self-pay

## 2022-05-17 ENCOUNTER — Telehealth: Payer: Self-pay

## 2022-05-17 MED ORDER — ZOLPIDEM TARTRATE 10 MG PO TABS
ORAL_TABLET | ORAL | 0 refills | Status: DC
  Filled 2022-05-17: qty 1, 1d supply, fill #0

## 2022-05-17 NOTE — Telephone Encounter (Signed)
Spoke with patient and she is scheduled for injection 05/30/22 and needs triazolam pre-procedure sent to pharmacy

## 2022-05-18 ENCOUNTER — Other Ambulatory Visit: Payer: Self-pay | Admitting: Physical Medicine and Rehabilitation

## 2022-05-18 ENCOUNTER — Other Ambulatory Visit: Payer: Self-pay

## 2022-05-18 MED ORDER — ZOLPIDEM TARTRATE 10 MG PO TABS: ORAL_TABLET | ORAL | 0 refills | Status: AC

## 2022-05-23 ENCOUNTER — Other Ambulatory Visit: Payer: Self-pay

## 2022-05-30 ENCOUNTER — Other Ambulatory Visit: Payer: Self-pay

## 2022-05-30 ENCOUNTER — Ambulatory Visit (INDEPENDENT_AMBULATORY_CARE_PROVIDER_SITE_OTHER): Payer: Medicaid Other | Admitting: Physical Medicine and Rehabilitation

## 2022-05-30 VITALS — BP 123/43 | HR 114

## 2022-05-30 DIAGNOSIS — M5412 Radiculopathy, cervical region: Secondary | ICD-10-CM

## 2022-05-30 MED ORDER — METHYLPREDNISOLONE ACETATE 80 MG/ML IJ SUSP
80.0000 mg | Freq: Once | INTRAMUSCULAR | Status: AC
  Administered 2022-05-30: 80 mg

## 2022-05-30 NOTE — Progress Notes (Signed)
Functional Pain Scale - descriptive words and definitions  Distressing (6)    Pain is present/unable to complete most ADLs limited by pain/sleep is difficult and active distraction is only marginal. Moderate range order  Average Pain  varies on activity   +Driver, -BT, -Dye Allergies.  Neck pain that radiates to the right arm

## 2022-05-30 NOTE — Patient Instructions (Signed)

## 2022-06-12 ENCOUNTER — Encounter: Payer: Self-pay | Admitting: *Deleted

## 2022-06-12 ENCOUNTER — Other Ambulatory Visit: Payer: Self-pay | Admitting: Surgical

## 2022-06-12 NOTE — Progress Notes (Signed)
Katie Benson - 42 y.o. female MRN [id number]  Date of birth: Dec 29, 1980  Office Visit Note: Visit Date: 05/30/2022 PCP: Marcine Matar, MD Referred by: Marcine Matar, MD  Subjective: Chief Complaint  Patient presents with   Neck - Pain   HPI:  Katie Benson is a 42 y.o. female who comes in today at the request of Karenann Cai, PA-C for planned Right C7-T1 Cervical Interlaminar epidural steroid injection with fluoroscopic guidance.  The patient has failed conservative care including home exercise, medications, time and activity modification.  This injection will be diagnostic and hopefully therapeutic.  Please see requesting physician notes for further details and justification.   ROS Otherwise per HPI.  Assessment & Plan: Visit Diagnoses:    ICD-10-CM   1. Cervical radiculopathy  M54.12 XR C-ARM NO REPORT    Epidural Steroid injection    methylPREDNISolone acetate (DEPO-MEDROL) injection 80 mg      Plan: No additional findings.   Meds & Orders:  Meds ordered this encounter  Medications   methylPREDNISolone acetate (DEPO-MEDROL) injection 80 mg    Orders Placed This Encounter  Procedures   XR C-ARM NO REPORT   Epidural Steroid injection    Follow-up: Return for visit to requesting provider as needed.   Procedures: No procedures performed  Cervical Epidural Steroid Injection - Interlaminar Approach with Fluoroscopic Guidance  Patient: Katie Benson      Date of Birth: Apr 28, 1980 MRN: [id number] PCP: Marcine Matar, MD      Visit Date: 05/30/2022   Universal Protocol:    Date/Time: 04/15/247:15 PM  Consent Given By: the patient  Position: PRONE  Additional Comments: Vital signs were monitored before and after the procedure. Patient was prepped and draped in the usual sterile fashion. The correct patient, procedure, and site was verified.   Injection Procedure Details:   Procedure diagnoses: Cervical radiculopathy  [M54.12]    Meds Administered:  Meds ordered this encounter  Medications   methylPREDNISolone acetate (DEPO-MEDROL) injection 80 mg     Laterality: Right  Location/Site: C7-T1  Needle: 3.5 in., 20 ga. Tuohy  Needle Placement: Paramedian epidural space  Findings:  -Comments: Excellent flow of contrast into the epidural space.  Despite use of preprocedure oral sedation she still had some difficulty Duda anxiety and tensing up etc.  Were able to do the injection however today without much issue.  She did better than the prior attempt.  Procedure Details: Using a paramedian approach from the side mentioned above, the region overlying the inferior lamina was localized under fluoroscopic visualization and the soft tissues overlying this structure were infiltrated with 4 ml. of 1% Lidocaine without Epinephrine. A # 20 gauge, Tuohy needle was inserted into the epidural space using a paramedian approach.  The epidural space was localized using loss of resistance along with contralateral oblique bi-planar fluoroscopic views.  After negative aspirate for air, blood, and CSF, a 2 ml. volume of Isovue-250 was injected into the epidural space and the flow of contrast was observed. Radiographs were obtained for documentation purposes.   The injectate was administered into the level noted above.  Additional Comments:  The patient tolerated the procedure well Dressing: 2 x 2 sterile gauze and Band-Aid    Post-procedure details: Patient was observed during the procedure. Post-procedure instructions were reviewed.  Patient left the clinic in stable condition.   Clinical History: MRI CERVICAL SPINE WITHOUT CONTRAST   TECHNIQUE: Multiplanar, multisequence MR imaging of the cervical spine was performed.  No intravenous contrast was administered.   COMPARISON:  CT scan 12/26/2012   FINDINGS: Alignment: No vertebral subluxation is observed.   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 Disc desiccation at all levels between C2 and C7.   Cord: No significant abnormal spinal cord signal is observed.   Posterior Fossa, vertebral arteries, paraspinal tissues: Unremarkable   Disc levels:   C2-3: Unremarkable.   C3-4: Borderline bilateral foraminal stenosis due to disc bulge and uncinate spurring.   C4-5: Moderate to prominent bilateral foraminal stenosis and borderline central narrowing of the thecal sac due to disc bulge and uncinate spurring.   C5-6: Moderate right and moderate to prominent left foraminal stenosis and borderline central narrowing of the thecal sac due to disc bulge and uncinate spurring.   C6-7: Mild right and moderate left foraminal stenosis due to disc bulge and uncinate spurring.   C7-T1: No impingement.  Minimal uncinate spurring.   T1-2: Unremarkable.   IMPRESSION: 1. Cervical spondylosis, degenerative disc disease, and mildly congenitally short pedicles contribute to moderate to prominent impingement at C4-5; moderate impingement at C5-6; and mild impingement at C6-7, as detailed above.     Electronically Signed   By: Gaylyn Rong M.D.   On: 08/01/2020 09:25  ---------------------------------------------- EXAM: MRI LUMBAR SPINE WITHOUT CONTRAST   TECHNIQUE: Multiplanar, multisequence MR imaging of the lumbar spine was performed. No intravenous contrast was administered.   COMPARISON:  Lumbar spine x-rays dated June 23, 2021. CT abdomen pelvis dated March 26, 2020.   FINDINGS: Segmentation:  Standard.   Alignment:  Physiologic.   Vertebrae:  No fracture, evidence of discitis, or bone lesion.   Conus medullaris and cauda equina: Conus extends to the L1-L2 level. Conus and cauda equina appear normal.   Paraspinal and other soft tissues: Negative.   Disc levels:   T11-T12 to L2-L3: Negative.   L3-L4: Mild disc bulging and bilateral facet arthropathy. Mild bilateral neuroforaminal stenosis.  No spinal canal stenosis.   L4-L5: Negative disc. Mild bilateral facet arthropathy. Mild bilateral neuroforaminal stenosis. No spinal canal stenosis.   L5-S1: Negative disc. Mild bilateral facet arthropathy. No stenosis.   IMPRESSION: 1. Mild degenerative changes of the lower lumbar spine as described above. No high-grade stenosis or impingement.     Electronically Signed   By: Obie Dredge M.D.   On: 08/19/2021 16:36     Objective:  VS:  HT:    WT:   BMI:     BP:(!) 123/43  HR:(!) 114bpm  TEMP: ( )  RESP:  Physical Exam Vitals and nursing note reviewed.  Constitutional:      General: She is not in acute distress.    Appearance: Normal appearance. She is not ill-appearing.  HENT:     Head: Normocephalic and atraumatic.     Right Ear: External ear normal.     Left Ear: External ear normal.  Eyes:     Extraocular Movements: Extraocular movements intact.  Cardiovascular:     Rate and Rhythm: Normal rate.     Pulses: Normal pulses.  Musculoskeletal:     Cervical back: Tenderness present. No rigidity.     Right lower leg: No edema.     Left lower leg: No edema.     Comments: Patient has good strength in the upper extremities including 5 out of 5 strength in wrist extension long finger flexion and APB.  There is no atrophy of the hands intrinsically.  There is a negative Hoffmann's test.  Lymphadenopathy:     Cervical: No cervical adenopathy.  Skin:    Findings: No erythema, lesion or rash.  Neurological:     General: No focal deficit present.     Mental Status: She is alert and oriented to person, place, and time.     Sensory: No sensory deficit.     Motor: No weakness or abnormal muscle tone.     Coordination: Coordination normal.  Psychiatric:        Mood and Affect: Mood normal.        Behavior: Behavior normal.      Imaging: No results found.

## 2022-06-12 NOTE — Procedures (Signed)
Cervical Epidural Steroid Injection - Interlaminar Approach with Fluoroscopic Guidance  Patient: Katie Benson      Date of Birth: [birth year] MRN: [id number] PCP: Marcine Matar, MD      Visit Date: 05/30/2022   Universal Protocol:    Date/Time: 04/15/247:15 PM  Consent Given By: the patient  Position: PRONE  Additional Comments: Vital signs were monitored before and after the procedure. Patient was prepped and draped in the usual sterile fashion. The correct patient, procedure, and site was verified.   Injection Procedure Details:   Procedure diagnoses: Cervical radiculopathy [M54.12]    Meds Administered:  Meds ordered this encounter  Medications   methylPREDNISolone acetate (DEPO-MEDROL) injection 80 mg     Laterality: Right  Location/Site: C7-T1  Needle: 3.5 in., 20 ga. Tuohy  Needle Placement: Paramedian epidural space  Findings:  -Comments: Excellent flow of contrast into the epidural space.  Despite use of preprocedure oral sedation she still had some difficulty Duda anxiety and tensing up etc.  Were able to do the injection however today without much issue.  She did better than the prior attempt.  Procedure Details: Using a paramedian approach from the side mentioned above, the region overlying the inferior lamina was localized under fluoroscopic visualization and the soft tissues overlying this structure were infiltrated with 4 ml. of 1% Lidocaine without Epinephrine. A # 20 gauge, Tuohy needle was inserted into the epidural space using a paramedian approach.  The epidural space was localized using loss of resistance along with contralateral oblique bi-planar fluoroscopic views.  After negative aspirate for air, blood, and CSF, a 2 ml. volume of Isovue-250 was injected into the epidural space and the flow of contrast was observed. Radiographs were obtained for documentation purposes.   The injectate was administered into the level noted  above.  Additional Comments:  The patient tolerated the procedure well Dressing: 2 x 2 sterile gauze and Band-Aid    Post-procedure details: Patient was observed during the procedure. Post-procedure instructions were reviewed.  Patient left the clinic in stable condition.

## 2022-06-13 ENCOUNTER — Other Ambulatory Visit: Payer: Self-pay

## 2022-06-13 MED ORDER — CYCLOBENZAPRINE HCL 10 MG PO TABS
10.0000 mg | ORAL_TABLET | Freq: Three times a day (TID) | ORAL | 1 refills | Status: DC
  Filled 2022-06-13 – 2022-07-03 (×2): qty 90, 30d supply, fill #0
  Filled 2022-07-28: qty 90, 30d supply, fill #1

## 2022-06-13 MED ORDER — GABAPENTIN 300 MG PO CAPS
300.0000 mg | ORAL_CAPSULE | Freq: Three times a day (TID) | ORAL | 1 refills | Status: DC
  Filled 2022-06-13 – 2022-07-03 (×2): qty 90, 30d supply, fill #0
  Filled 2022-07-28: qty 90, 30d supply, fill #1

## 2022-06-14 ENCOUNTER — Other Ambulatory Visit: Payer: Self-pay

## 2022-06-20 ENCOUNTER — Other Ambulatory Visit: Payer: Self-pay

## 2022-06-21 ENCOUNTER — Other Ambulatory Visit: Payer: Self-pay

## 2022-06-26 ENCOUNTER — Other Ambulatory Visit: Payer: Self-pay

## 2022-07-03 ENCOUNTER — Other Ambulatory Visit: Payer: Self-pay

## 2022-07-03 ENCOUNTER — Ambulatory Visit (INDEPENDENT_AMBULATORY_CARE_PROVIDER_SITE_OTHER): Payer: Medicaid Other | Admitting: Orthopedic Surgery

## 2022-07-03 DIAGNOSIS — M5412 Radiculopathy, cervical region: Secondary | ICD-10-CM

## 2022-07-04 ENCOUNTER — Encounter: Payer: Self-pay | Admitting: Orthopedic Surgery

## 2022-07-04 NOTE — Progress Notes (Signed)
Office Visit Note   Patient: Katie Benson           Date of Birth: 03-06-1980           MRN: [id number] Visit Date: 07/03/2022 Requested by: Marcine Matar, MD [address] [place] [address] [place],  [place] [address] PCP: Marcine Matar, MD  Subjective: Chief Complaint  Patient presents with   Neck - Follow-up    HPI: Katie Benson is a 42 y.o. female who presents to the office reporting right arm numbness and tingling.  Pain wakes her from sleep at night.  She did have a cervical spine ESI on 4-24 which only gave her partial relief.  She is in a lot of pain.  This is affecting her right arm only.  Using gabapentin in the morning and Flexeril at night.  Pain radiates from the neck and her arm feels numb at times.  She is trying to get a part-time job but that is difficult.  Her arm goes numb with use.  This includes sweeping the floor and washing dishes.  She also reports having some type of skin cyst on her thumb..                ROS: All systems reviewed are negative as they relate to the chief complaint within the history of present illness.  Patient denies fevers or chills.  Assessment & Plan: Visit Diagnoses:  1. Cervical radiculopathy     Plan: Impression is cervical spine disc disease with transient and only marginal relief from nonoperative treatment measures.  Counseled her extensively as to the importance of stopping smoking if she is going to get any count of cervical spine surgery done.  She is at the point with her symptoms because of borderline intractable pain and she wants to talk to a Careers adviser.  MRI scan is from 2 years ago.  She needs repeat MRI scan since we are considering surgical intervention.  I can call her with that result and likely have her follow-up with Dr. Christell Constant afterwards.  Follow-Up Instructions: No follow-ups on file.   Orders:  Orders Placed This Encounter  Procedures   MR Cervical Spine w/o contrast   No orders of the defined  types were placed in this encounter.     Procedures: No procedures performed   Clinical Data: No additional findings.  Objective: Vital Signs: There were no vitals taken for this visit.  Physical Exam:  Constitutional: Patient appears well-developed HEENT:  Head: Normocephalic Eyes:EOM are normal Neck: Normal range of motion Cardiovascular: Normal rate Pulmonary/chest: Effort normal Neurologic: Patient is alert Skin: Skin is warm Psychiatric: Patient has normal mood and affect  Ortho Exam: Ortho exam demonstrates pretty reasonable cervical spine range of motion.  Does have some paresthesias in the C5-6 distribution on the right compared to the left.  Reflexes symmetric.  Negative Hoffman's reflex.  5 out of 5 grip EPL FPL interosseous wrist flexion extension bicep triceps and deltoid strength.  Does have a little bit of pain with rotation to the right as well as with extension.  No muscle atrophy right arm versus left arm.  Specialty Comments:  MRI CERVICAL SPINE WITHOUT CONTRAST   TECHNIQUE: Multiplanar, multisequence MR imaging of the cervical spine was performed. No intravenous contrast was administered.   COMPARISON:  CT scan 12/26/2012   FINDINGS: Alignment: No vertebral subluxation is observed.   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 Disc desiccation at all levels between  C2 and C7.   Cord: No significant abnormal spinal cord signal is observed.   Posterior Fossa, vertebral arteries, paraspinal tissues: Unremarkable   Disc levels:   C2-3: Unremarkable.   C3-4: Borderline bilateral foraminal stenosis due to disc bulge and uncinate spurring.   C4-5: Moderate to prominent bilateral foraminal stenosis and borderline central narrowing of the thecal sac due to disc bulge and uncinate spurring.   C5-6: Moderate right and moderate to prominent left foraminal stenosis and borderline central narrowing of the thecal sac due to disc bulge  and uncinate spurring.   C6-7: Mild right and moderate left foraminal stenosis due to disc bulge and uncinate spurring.   C7-T1: No impingement.  Minimal uncinate spurring.   T1-2: Unremarkable.   IMPRESSION: 1. Cervical spondylosis, degenerative disc disease, and mildly congenitally short pedicles contribute to moderate to prominent impingement at C4-5; moderate impingement at C5-6; and mild impingement at C6-7, as detailed above.     Electronically Signed   By: Gaylyn Rong M.D.   On: 08/01/2020 09:25  ---------------------------------------------- EXAM: MRI LUMBAR SPINE WITHOUT CONTRAST   TECHNIQUE: Multiplanar, multisequence MR imaging of the lumbar spine was performed. No intravenous contrast was administered.   COMPARISON:  Lumbar spine x-rays dated June 23, 2021. CT abdomen pelvis dated March 26, 2020.   FINDINGS: Segmentation:  Standard.   Alignment:  Physiologic.   Vertebrae:  No fracture, evidence of discitis, or bone lesion.   Conus medullaris and cauda equina: Conus extends to the L1-L2 level. Conus and cauda equina appear normal.   Paraspinal and other soft tissues: Negative.   Disc levels:   T11-T12 to L2-L3: Negative.   L3-L4: Mild disc bulging and bilateral facet arthropathy. Mild bilateral neuroforaminal stenosis. No spinal canal stenosis.   L4-L5: Negative disc. Mild bilateral facet arthropathy. Mild bilateral neuroforaminal stenosis. No spinal canal stenosis.   L5-S1: Negative disc. Mild bilateral facet arthropathy. No stenosis.   IMPRESSION: 1. Mild degenerative changes of the lower lumbar spine as described above. No high-grade stenosis or impingement.     Electronically Signed   By: Obie Dredge M.D.   On: 08/19/2021 16:36  Imaging: No results found.   PMFS History: Patient Active Problem List   Diagnosis Date Noted   History of esophageal ulcer 12/22/2021   Ingrown nail of fifth toe 12/22/2021   Chronic toe  pain, left foot 12/22/2021   Blurry vision, bilateral 12/22/2021   Left corneal abrasion 12/22/2021   Osteoarthritis of left knee 12/14/2020   Subacromial bursitis of right shoulder joint 07/15/2020   Cervical radiculopathy 06/24/2020   Effusion, left knee 06/24/2019   Pes anserine bursitis 04/29/2019   Bucket handle tear of lateral meniscus of left knee 12/12/2018   Ganglion cyst 12/12/2018   Fibroids 03/18/2018   S/P parathyroidectomy 02/12/2018   Primary hyperparathyroidism (HCC) 01/08/2018   Hyperparathyroidism , secondary, non-renal (HCC) 01/08/2018   Fibroid uterus 12/06/2017   Hypercalcemia 12/06/2017   Fibroid 10/15/2017   Abnormal uterine bleeding (AUB) 08/20/2017   Anemia 08/20/2017   Past Medical History:  Diagnosis Date   Anemia    Arthritis    Fibroids    GERD (gastroesophageal reflux disease)    History of kidney stones    Neck injury     Family History  Problem Relation Age of Onset   Diabetes Sister    Cancer Maternal Grandmother        ovarian   Hypercalcemia Mother    Other Father  HX Unknown    Past Surgical History:  Procedure Laterality Date   CESAREAN SECTION     IR ANGIOGRAM PELVIS SELECTIVE OR SUPRASELECTIVE  03/18/2018   IR ANGIOGRAM PELVIS SELECTIVE OR SUPRASELECTIVE  03/18/2018   IR ANGIOGRAM SELECTIVE EACH ADDITIONAL VESSEL  03/18/2018   IR ANGIOGRAM SELECTIVE EACH ADDITIONAL VESSEL  03/18/2018   IR EMBO TUMOR ORGAN ISCHEMIA INFARCT INC GUIDE ROADMAPPING  03/18/2018   IR RADIOLOGIST EVAL & MGMT  10/17/2017   IR RADIOLOGIST EVAL & MGMT  12/20/2017   IR RADIOLOGIST EVAL & MGMT  04/17/2018   IR US GUIDE VASC ACCESS RIGHT  03/18/2018   KNEE ARTHROSCOPY WITH LATERAL MENISECTOMY Left 12/24/2018   Procedure: LEFT KNEE ARTHROSCOPY WITH LATERAL MENISECTOM, debriment of GANGLION CYST;  Surgeon: Valeria Batman, MD;  Location: [facility];  Service: Orthopedics;  Laterality: Left;   PARATHYROIDECTOMY N/A 02/12/2018   Procedure: PARATHYROIDECTOMY;   Surgeon: Duanne Guess, MD;  Location: [facility];  Service: General;  Laterality: N/A;   WISDOM TOOTH EXTRACTION     age 72   Social History   Occupational History   Occupation: unemployed  Tobacco Use   Smoking status: Some Days    Packs/day: 0.25    Years: 14.00    Additional pack years: 0.00    Total pack years: 3.50    Types: Cigarettes   Smokeless tobacco: Never  Vaping Use   Vaping Use: Never used  Substance and Sexual Activity   Alcohol use: Yes    Comment: occassionally on weekends   Drug use: Yes    Types: Marijuana    Comment: multiple times a day   Sexual activity: Yes    Partners: Male    Birth control/protection: Condom

## 2022-07-28 ENCOUNTER — Other Ambulatory Visit: Payer: Self-pay

## 2022-07-30 ENCOUNTER — Other Ambulatory Visit: Payer: Medicaid Other

## 2022-07-31 ENCOUNTER — Other Ambulatory Visit: Payer: Self-pay

## 2022-08-25 ENCOUNTER — Other Ambulatory Visit: Payer: Self-pay | Admitting: Surgical

## 2022-08-28 ENCOUNTER — Other Ambulatory Visit: Payer: Self-pay

## 2022-08-28 MED ORDER — CYCLOBENZAPRINE HCL 10 MG PO TABS
10.0000 mg | ORAL_TABLET | Freq: Three times a day (TID) | ORAL | 1 refills | Status: DC
  Filled 2022-08-28 – 2022-09-07 (×2): qty 90, 30d supply, fill #0
  Filled 2022-10-11: qty 90, 30d supply, fill #1

## 2022-08-28 MED ORDER — GABAPENTIN 300 MG PO CAPS
300.0000 mg | ORAL_CAPSULE | Freq: Three times a day (TID) | ORAL | 1 refills | Status: DC
  Filled 2022-08-28 – 2022-09-07 (×2): qty 90, 30d supply, fill #0
  Filled 2022-10-11: qty 90, 30d supply, fill #1

## 2022-09-05 ENCOUNTER — Other Ambulatory Visit: Payer: Self-pay

## 2022-09-07 ENCOUNTER — Other Ambulatory Visit: Payer: Self-pay | Admitting: Gastroenterology

## 2022-09-07 ENCOUNTER — Other Ambulatory Visit: Payer: Self-pay

## 2022-09-08 ENCOUNTER — Telehealth: Payer: Self-pay

## 2022-09-08 NOTE — Telephone Encounter (Signed)
Patients MRI that was ordered was denied for the following reason. Please advise how you wish to proceed.   Your doctor told us that you have neck pain. Your doctor ordered an MRI of your neck. An MRI is a way to take pictures of the inside of your body. This test should be used when the pain has not improved after six weeks of treatment by your doctor. Treatment should include medications and other forms of therapy. These need to include home exercises or physical therapy. We reviewed the notes we have. The notes do not show that you have recently had at least six weeks of such treatment. The notes do not show that there is a reason you cannot have such treatment. Based on the information we have, this test is not medically necessary. We used [employer] Medical Benefits Management Clinical Guideline titled Imaging of the Spine to make this decision. You may view this guideline at [email or web address]/mbm-[email or web address].

## 2022-09-11 NOTE — Telephone Encounter (Signed)
Can do P2P if needed. Seems like she failed recent ESI and needs update scan for preop planning

## 2022-10-11 ENCOUNTER — Other Ambulatory Visit: Payer: Self-pay

## 2022-10-20 ENCOUNTER — Other Ambulatory Visit: Payer: Self-pay

## 2022-10-25 ENCOUNTER — Ambulatory Visit: Payer: Medicaid Other | Admitting: Surgical

## 2022-10-25 DIAGNOSIS — M5412 Radiculopathy, cervical region: Secondary | ICD-10-CM | POA: Diagnosis not present

## 2022-10-25 DIAGNOSIS — M1712 Unilateral primary osteoarthritis, left knee: Secondary | ICD-10-CM | POA: Diagnosis not present

## 2022-10-26 ENCOUNTER — Encounter: Payer: Self-pay | Admitting: Surgical

## 2022-10-26 NOTE — Progress Notes (Signed)
Office Visit Note   Patient: Katie Benson           Date of Birth: [birth year]           MRN: [id number] Visit Date: 10/25/2022 Requested by: Marcine Matar, MD [address] [place] [address] [place],  [place] [address] PCP: Default, Provider, MD  Subjective: Chief Complaint  Patient presents with   Left Knee - Pain    HPI: Katie Benson is a 42 y.o. female who presents to the office reporting left knee pain.  Patient describes lateral sided left knee pain.  She says no new injury.  She has had a new part-time job working 3 days a week in the last several months and she feels that the knee pain is particularly worse after she works these shifts.  Occasional popping but no consistent mechanical symptoms.  No fevers or chills.  No groin pain.  Also complains of radicular right arm pain.  She never ended up having MRI of the cervical spine at her last visit.  Cortisone injection in the neck has not given her much relief.  She also complains of left fifth toe pain.  This has had a nail procedure done by a podiatrist with no significant relief and she complains of continued pain..                ROS: All systems reviewed are negative as they relate to the chief complaint within the history of present illness.  Patient denies fevers or chills.  Assessment & Plan: Visit Diagnoses:  1. Unilateral primary osteoarthritis, left knee   2. Cervical radiculopathy     Plan: Patient is a 42 year old female who presents for evaluation of left knee pain.  She has history of left knee osteoarthritis advanced for her age.  She would like to repeat cortisone injection and this was administered without complication.  Also has some concern about the radicular pain down her right arm that is likely related to her cervical spine with MRI from June 2022 demonstrating moderate impingement at C4-C5, C5-C6 with congenitally short pedicles.  She has had prior good relief of her radicular symptoms and neck  pain with cervical spine ESI but the last ESI did not really provide any significant lasting relief.  Plan for further evaluation with Dr. Christell Constant for cervical spine radiculopathy.  She also complains of left toe pain that has persisted despite a nail procedure by podiatry.  Plan to refer patient to Dr. Lajoyce Corners for further evaluation.  Follow-Up Instructions: Return if symptoms worsen or fail to improve.   Orders:  Orders Placed This Encounter  Procedures   Ambulatory referral to Orthopedic Surgery   No orders of the defined types were placed in this encounter.     Procedures: Large Joint Inj: L knee on 10/25/2022 9:00 AM Indications: diagnostic evaluation, joint swelling and pain Details: 18 G 1.5 in needle, superolateral approach  Arthrogram: No  Medications: 5 mL lidocaine 1 %; 40 mg methylPREDNISolone acetate 40 MG/ML; 4 mL bupivacaine 0.25 % Outcome: tolerated well, no immediate complications Procedure, treatment alternatives, risks and benefits explained, specific risks discussed. Consent was given by the patient. Immediately prior to procedure a time out was called to verify the correct patient, procedure, equipment, support staff and site/side marked as required. Patient was prepped and draped in the usual sterile fashion.       Clinical Data: No additional findings.  Objective: Vital Signs: There were no vitals taken for this  visit.  Physical Exam:  Constitutional: Patient appears well-developed HEENT:  Head: Normocephalic Eyes:EOM are normal Neck: Normal range of motion Cardiovascular: Normal rate Pulmonary/chest: Effort normal Neurologic: Patient is alert Skin: Skin is warm Psychiatric: Patient has normal mood and affect  Ortho Exam: Ortho exam demonstrates left knee with trace effusion.  Tenderness over the medial and lateral joint lines with primarily the lateral joint line.  She has range of motion from 0 degrees extension to greater than 90 degrees of knee  flexion but terminal extension is somewhat painful.  She has good quad strength and able to perform straight leg raise without extensor lag.  No calf tenderness.  Negative Homans' sign.  No asymmetric warmth.  No evidence of cellulitis or any skin changes.  No pain with hip range of motion.  Specialty Comments:  MRI CERVICAL SPINE WITHOUT CONTRAST   TECHNIQUE: Multiplanar, multisequence MR imaging of the cervical spine was performed. No intravenous contrast was administered.   COMPARISON:  CT scan 12/26/2012   FINDINGS: Alignment: No vertebral subluxation is observed.   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 Disc desiccation at all levels between C2 and C7.   Cord: No significant abnormal spinal cord signal is observed.   Posterior Fossa, vertebral arteries, paraspinal tissues: Unremarkable   Disc levels:   C2-3: Unremarkable.   C3-4: Borderline bilateral foraminal stenosis due to disc bulge and uncinate spurring.   C4-5: Moderate to prominent bilateral foraminal stenosis and borderline central narrowing of the thecal sac due to disc bulge and uncinate spurring.   C5-6: Moderate right and moderate to prominent left foraminal stenosis and borderline central narrowing of the thecal sac due to disc bulge and uncinate spurring.   C6-7: Mild right and moderate left foraminal stenosis due to disc bulge and uncinate spurring.   C7-T1: No impingement.  Minimal uncinate spurring.   T1-2: Unremarkable.   IMPRESSION: 1. Cervical spondylosis, degenerative disc disease, and mildly congenitally short pedicles contribute to moderate to prominent impingement at C4-5; moderate impingement at C5-6; and mild impingement at C6-7, as detailed above.     Electronically Signed   By: Gaylyn Rong M.D.   On: 08/01/2020 09:25  ---------------------------------------------- EXAM: MRI LUMBAR SPINE WITHOUT CONTRAST   TECHNIQUE: Multiplanar, multisequence MR  imaging of the lumbar spine was performed. No intravenous contrast was administered.   COMPARISON:  Lumbar spine x-rays dated June 23, 2021. CT abdomen pelvis dated March 26, 2020.   FINDINGS: Segmentation:  Standard.   Alignment:  Physiologic.   Vertebrae:  No fracture, evidence of discitis, or bone lesion.   Conus medullaris and cauda equina: Conus extends to the L1-L2 level. Conus and cauda equina appear normal.   Paraspinal and other soft tissues: Negative.   Disc levels:   T11-T12 to L2-L3: Negative.   L3-L4: Mild disc bulging and bilateral facet arthropathy. Mild bilateral neuroforaminal stenosis. No spinal canal stenosis.   L4-L5: Negative disc. Mild bilateral facet arthropathy. Mild bilateral neuroforaminal stenosis. No spinal canal stenosis.   L5-S1: Negative disc. Mild bilateral facet arthropathy. No stenosis.   IMPRESSION: 1. Mild degenerative changes of the lower lumbar spine as described above. No high-grade stenosis or impingement.     Electronically Signed   By: Obie Dredge M.D.   On: 08/19/2021 16:36  Imaging: No results found.   PMFS History: Patient Active Problem List   Diagnosis Date Noted   History of esophageal ulcer 12/22/2021   Ingrown nail of fifth toe 12/22/2021   Chronic  toe pain, left foot 12/22/2021   Blurry vision, bilateral 12/22/2021   Left corneal abrasion 12/22/2021   Osteoarthritis of left knee 12/14/2020   Subacromial bursitis of right shoulder joint 07/15/2020   Cervical radiculopathy 06/24/2020   Effusion, left knee 06/24/2019   Pes anserine bursitis 04/29/2019   Bucket handle tear of lateral meniscus of left knee 12/12/2018   Ganglion cyst 12/12/2018   Fibroids 03/18/2018   S/P parathyroidectomy 02/12/2018   Primary hyperparathyroidism (HCC) 01/08/2018   Hyperparathyroidism , secondary, non-renal (HCC) 01/08/2018   Fibroid uterus 12/06/2017   Hypercalcemia 12/06/2017   Fibroid 10/15/2017   Abnormal uterine  bleeding (AUB) 08/20/2017   Anemia 08/20/2017   Past Medical History:  Diagnosis Date   Anemia    Arthritis    Fibroids    GERD (gastroesophageal reflux disease)    History of kidney stones    Neck injury     Family History  Problem Relation Age of Onset   Diabetes Sister    Cancer Maternal Grandmother        ovarian   Hypercalcemia Mother    Other Father        HX Unknown    Past Surgical History:  Procedure Laterality Date   CESAREAN SECTION     IR ANGIOGRAM PELVIS SELECTIVE OR SUPRASELECTIVE  03/18/2018   IR ANGIOGRAM PELVIS SELECTIVE OR SUPRASELECTIVE  03/18/2018   IR ANGIOGRAM SELECTIVE EACH ADDITIONAL VESSEL  03/18/2018   IR ANGIOGRAM SELECTIVE EACH ADDITIONAL VESSEL  03/18/2018   IR EMBO TUMOR ORGAN ISCHEMIA INFARCT INC GUIDE ROADMAPPING  03/18/2018   IR RADIOLOGIST EVAL & MGMT  10/17/2017   IR RADIOLOGIST EVAL & MGMT  12/20/2017   IR RADIOLOGIST EVAL & MGMT  04/17/2018   IR US GUIDE VASC ACCESS RIGHT  03/18/2018   KNEE ARTHROSCOPY WITH LATERAL MENISECTOMY Left 12/24/2018   Procedure: LEFT KNEE ARTHROSCOPY WITH LATERAL MENISECTOM, debriment of GANGLION CYST;  Surgeon: Valeria Batman, MD;  Location: [facility];  Service: Orthopedics;  Laterality: Left;   PARATHYROIDECTOMY N/A 02/12/2018   Procedure: PARATHYROIDECTOMY;  Surgeon: Duanne Guess, MD;  Location: [facility];  Service: General;  Laterality: N/A;   WISDOM TOOTH EXTRACTION     age 19   Social History   Occupational History   Occupation: unemployed  Tobacco Use   Smoking status: Some Days    Current packs/day: 0.25    Average packs/day: 0.3 packs/day for 14.0 years (3.5 ttl pk-yrs)    Types: Cigarettes   Smokeless tobacco: Never  Vaping Use   Vaping status: Never Used  Substance and Sexual Activity   Alcohol use: Yes    Comment: occassionally on weekends   Drug use: Yes    Types: Marijuana    Comment: multiple times a day   Sexual activity: Yes    Partners: Male    Birth control/protection: Condom

## 2022-10-27 ENCOUNTER — Encounter: Payer: Self-pay | Admitting: Surgical

## 2022-10-27 ENCOUNTER — Telehealth: Payer: Self-pay | Admitting: Orthopedic Surgery

## 2022-10-27 MED ORDER — METHYLPREDNISOLONE ACETATE 40 MG/ML IJ SUSP
40.0000 mg | INTRAMUSCULAR | Status: AC | PRN
Start: 2022-10-25 — End: 2022-10-25
  Administered 2022-10-25: 40 mg via INTRA_ARTICULAR

## 2022-10-27 MED ORDER — LIDOCAINE HCL 1 % IJ SOLN
5.0000 mL | INTRAMUSCULAR | Status: AC | PRN
Start: 2022-10-25 — End: 2022-10-25
  Administered 2022-10-25: 5 mL

## 2022-10-27 MED ORDER — BUPIVACAINE HCL 0.25 % IJ SOLN
4.0000 mL | INTRAMUSCULAR | Status: AC | PRN
Start: 2022-10-25 — End: 2022-10-25
  Administered 2022-10-25: 4 mL via INTRA_ARTICULAR

## 2022-10-27 NOTE — Telephone Encounter (Signed)
Per Katie Benson, pt needs to see Dr Lajoyce Corners for left 5th toe pain. This is not urgent referral, please schedule at his first available. Thank you!!

## 2022-10-31 ENCOUNTER — Ambulatory Visit: Payer: Medicaid Other | Admitting: Orthopedic Surgery

## 2022-11-01 ENCOUNTER — Telehealth: Payer: Self-pay | Admitting: Surgical

## 2022-11-01 ENCOUNTER — Other Ambulatory Visit: Payer: Self-pay | Admitting: Surgical

## 2022-11-01 MED ORDER — CYCLOBENZAPRINE HCL 10 MG PO TABS: 10.0000 mg | ORAL_TABLET | Freq: Three times a day (TID) | ORAL | 1 refills | Status: DC

## 2022-11-01 MED ORDER — GABAPENTIN 300 MG PO CAPS: 300.0000 mg | ORAL_CAPSULE | Freq: Three times a day (TID) | ORAL | 1 refills | Status: DC

## 2022-11-01 NOTE — Telephone Encounter (Signed)
Patient came in advised she need Rx refilled Gabapentin and Flexeril. The number to contact patient is [phone]

## 2022-11-01 NOTE — Telephone Encounter (Signed)
Sent in

## 2022-11-17 ENCOUNTER — Other Ambulatory Visit: Payer: Self-pay | Admitting: Surgical

## 2022-11-17 ENCOUNTER — Other Ambulatory Visit: Payer: Self-pay

## 2022-11-17 NOTE — Telephone Encounter (Signed)
I just reordered this on 9/4?

## 2022-11-20 ENCOUNTER — Encounter: Payer: Self-pay | Admitting: Orthopedic Surgery

## 2022-11-20 ENCOUNTER — Ambulatory Visit: Payer: Medicaid Other | Admitting: Orthopedic Surgery

## 2022-11-20 ENCOUNTER — Other Ambulatory Visit: Payer: Self-pay

## 2022-11-20 DIAGNOSIS — L03032 Cellulitis of left toe: Secondary | ICD-10-CM | POA: Diagnosis not present

## 2022-11-20 MED ORDER — DOXYCYCLINE HYCLATE 100 MG PO TABS
100.0000 mg | ORAL_TABLET | Freq: Two times a day (BID) | ORAL | 0 refills | Status: DC
  Filled 2022-11-20: qty 14, 7d supply, fill #0

## 2022-11-20 NOTE — Progress Notes (Signed)
Office Visit Note   Patient: Katie Benson           Date of Birth: 1980-03-08           MRN: [id number] Visit Date: 11/20/2022              Requested by: No referring provider defined for this encounter. PCP: Default, Provider, MD  Chief Complaint  Patient presents with   Left Foot - Pain      HPI: Patient is a 42 year old woman who is seen for initial evaluation for paronychial infection left little toe.  Patient states that she is status post a surgical procedure with podiatry where the skin was excised from the lateral border of the left little toe.  Patient states she has had increasing pain swelling and redness.  Assessment & Plan: Visit Diagnoses: No diagnosis found.  Plan: Patient is placed in a postoperative shoe.  A prescription for doxycycline is sent in.  She will start Dial soap cleansing and antibiotic ointment dressing changes daily starting tomorrow.  Follow-Up Instructions: Return in about 1 week (around 11/27/2022).   Ortho Exam  Patient is alert, oriented, no adenopathy, well-dressed, normal affect, normal respiratory effort. Examination patient has good pulses.  She has cellulitis swelling and tenderness to palpation over the lateral border of the left little toe with a paronychial infection.  After informed consent the toe was sterilely prepped with Betadine she underwent a digital block with 7 cc of 1% lidocaine plain.  The nail was removed the paronychial infection decompressed and a sterile dressing applied.  Patient was placed in a postoperative shoe.  Imaging: No results found. No images are attached to the encounter.  Labs: Lab Results  Component Value Date   REPTSTATUS 01/15/2013 FINAL 01/14/2013   CULT  01/14/2013    Multiple bacterial morphotypes present, none predominant. Suggest appropriate recollection if clinically indicated. Performed at [facility]     Lab Results  Component Value Date   ALBUMIN 4.4 07/07/2021    ALBUMIN 4.0 03/18/2020   ALBUMIN 4.1 03/18/2018    Lab Results  Component Value Date   MG 2.1 12/07/2017   Lab Results  Component Value Date   VD25OH 7.6 (L) 12/07/2017    No results found for: "PREALBUMIN"    Latest Ref Rng & Units 07/07/2021    2:25 PM 03/18/2020   12:08 PM 12/19/2018    9:01 AM  CBC EXTENDED  WBC 3.4 - 10.8 x10E3/uL 9.9  10.7  11.1   RBC 3.77 - 5.28 x10E6/uL 4.85  4.62  5.33   Hemoglobin 11.1 - 15.9 g/dL 40.9  81.1  91.4   HCT 34.0 - 46.6 % 38.0  35.1  40.1   Platelets 150 - 450 x10E3/uL 318  327  375   NEUT# 1.4 - 7.0 x10E3/uL 6.5     Lymph# 0.7 - 3.1 x10E3/uL 2.7        There is no height or weight on file to calculate BMI.  Orders:  No orders of the defined types were placed in this encounter.  Meds ordered this encounter  Medications   doxycycline (VIBRA-TABS) 100 MG tablet    Sig: Take 1 tablet (100 mg total) by mouth 2 (two) times daily.    Dispense:  14 tablet    Refill:  0     Procedures: No procedures performed  Clinical Data: No additional findings.  ROS:  All other systems negative, except as noted in the HPI. Review  of Systems  Objective: Vital Signs: There were no vitals taken for this visit.  Specialty Comments:  MRI CERVICAL SPINE WITHOUT CONTRAST   TECHNIQUE: Multiplanar, multisequence MR imaging of the cervical spine was performed. No intravenous contrast was administered.   COMPARISON:  CT scan 12/26/2012   FINDINGS: Alignment: No vertebral subluxation is observed.   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 Disc desiccation at all levels between C2 and C7.   Cord: No significant abnormal spinal cord signal is observed.   Posterior Fossa, vertebral arteries, paraspinal tissues: Unremarkable   Disc levels:   C2-3: Unremarkable.   C3-4: Borderline bilateral foraminal stenosis due to disc bulge and uncinate spurring.   C4-5: Moderate to prominent bilateral foraminal stenosis  and borderline central narrowing of the thecal sac due to disc bulge and uncinate spurring.   C5-6: Moderate right and moderate to prominent left foraminal stenosis and borderline central narrowing of the thecal sac due to disc bulge and uncinate spurring.   C6-7: Mild right and moderate left foraminal stenosis due to disc bulge and uncinate spurring.   C7-T1: No impingement.  Minimal uncinate spurring.   T1-2: Unremarkable.   IMPRESSION: 1. Cervical spondylosis, degenerative disc disease, and mildly congenitally short pedicles contribute to moderate to prominent impingement at C4-5; moderate impingement at C5-6; and mild impingement at C6-7, as detailed above.     Electronically Signed   By: Gaylyn Rong M.D.   On: 08/01/2020 09:25  ---------------------------------------------- EXAM: MRI LUMBAR SPINE WITHOUT CONTRAST   TECHNIQUE: Multiplanar, multisequence MR imaging of the lumbar spine was performed. No intravenous contrast was administered.   COMPARISON:  Lumbar spine x-rays dated June 23, 2021. CT abdomen pelvis dated March 26, 2020.   FINDINGS: Segmentation:  Standard.   Alignment:  Physiologic.   Vertebrae:  No fracture, evidence of discitis, or bone lesion.   Conus medullaris and cauda equina: Conus extends to the L1-L2 level. Conus and cauda equina appear normal.   Paraspinal and other soft tissues: Negative.   Disc levels:   T11-T12 to L2-L3: Negative.   L3-L4: Mild disc bulging and bilateral facet arthropathy. Mild bilateral neuroforaminal stenosis. No spinal canal stenosis.   L4-L5: Negative disc. Mild bilateral facet arthropathy. Mild bilateral neuroforaminal stenosis. No spinal canal stenosis.   L5-S1: Negative disc. Mild bilateral facet arthropathy. No stenosis.   IMPRESSION: 1. Mild degenerative changes of the lower lumbar spine as described above. No high-grade stenosis or impingement.     Electronically Signed   By: Obie Dredge M.D.   On: 08/19/2021 16:36  PMFS History: Patient Active Problem List   Diagnosis Date Noted   History of esophageal ulcer 12/22/2021   Ingrown nail of fifth toe 12/22/2021   Chronic toe pain, left foot 12/22/2021   Blurry vision, bilateral 12/22/2021   Left corneal abrasion 12/22/2021   Osteoarthritis of left knee 12/14/2020   Subacromial bursitis of right shoulder joint 07/15/2020   Cervical radiculopathy 06/24/2020   Effusion, left knee 06/24/2019   Pes anserine bursitis 04/29/2019   Bucket handle tear of lateral meniscus of left knee 12/12/2018   Ganglion cyst 12/12/2018   Fibroids 03/18/2018   S/P parathyroidectomy 02/12/2018   Primary hyperparathyroidism (HCC) 01/08/2018   Hyperparathyroidism , secondary, non-renal (HCC) 01/08/2018   Fibroid uterus 12/06/2017   Hypercalcemia 12/06/2017   Fibroid 10/15/2017   Abnormal uterine bleeding (AUB) 08/20/2017   Anemia 08/20/2017   Past Medical History:  Diagnosis Date   Anemia    Arthritis  Fibroids    GERD (gastroesophageal reflux disease)    History of kidney stones    Neck injury     Family History  Problem Relation Age of Onset   Diabetes Sister    Cancer Maternal Grandmother        ovarian   Hypercalcemia Mother    Other Father        HX Unknown    Past Surgical History:  Procedure Laterality Date   CESAREAN SECTION     IR ANGIOGRAM PELVIS SELECTIVE OR SUPRASELECTIVE  03/18/2018   IR ANGIOGRAM PELVIS SELECTIVE OR SUPRASELECTIVE  03/18/2018   IR ANGIOGRAM SELECTIVE EACH ADDITIONAL VESSEL  03/18/2018   IR ANGIOGRAM SELECTIVE EACH ADDITIONAL VESSEL  03/18/2018   IR EMBO TUMOR ORGAN ISCHEMIA INFARCT INC GUIDE ROADMAPPING  03/18/2018   IR RADIOLOGIST EVAL & MGMT  10/17/2017   IR RADIOLOGIST EVAL & MGMT  12/20/2017   IR RADIOLOGIST EVAL & MGMT  04/17/2018   IR US GUIDE VASC ACCESS RIGHT  03/18/2018   KNEE ARTHROSCOPY WITH LATERAL MENISECTOMY Left 12/24/2018   Procedure: LEFT KNEE ARTHROSCOPY WITH LATERAL  MENISECTOM, debriment of GANGLION CYST;  Surgeon: Valeria Batman, MD;  Location: [facility];  Service: Orthopedics;  Laterality: Left;   PARATHYROIDECTOMY N/A 02/12/2018   Procedure: PARATHYROIDECTOMY;  Surgeon: Duanne Guess, MD;  Location: [facility];  Service: General;  Laterality: N/A;   WISDOM TOOTH EXTRACTION     age 43   Social History   Occupational History   Occupation: unemployed  Tobacco Use   Smoking status: Some Days    Current packs/day: 0.25    Average packs/day: 0.3 packs/day for 14.0 years (3.5 ttl pk-yrs)    Types: Cigarettes   Smokeless tobacco: Never  Vaping Use   Vaping status: Never Used  Substance and Sexual Activity   Alcohol use: Yes    Comment: occassionally on weekends   Drug use: Yes    Types: Marijuana    Comment: multiple times a day   Sexual activity: Yes    Partners: Male    Birth control/protection: Condom

## 2022-11-22 ENCOUNTER — Ambulatory Visit (INDEPENDENT_AMBULATORY_CARE_PROVIDER_SITE_OTHER): Payer: Medicaid Other | Admitting: Orthopedic Surgery

## 2022-11-22 ENCOUNTER — Encounter: Payer: Self-pay | Admitting: Orthopedic Surgery

## 2022-11-22 ENCOUNTER — Other Ambulatory Visit (INDEPENDENT_AMBULATORY_CARE_PROVIDER_SITE_OTHER): Payer: Medicaid Other

## 2022-11-22 VITALS — BP 119/84 | HR 98 | Ht 62.0 in | Wt 145.0 lb

## 2022-11-22 DIAGNOSIS — M5412 Radiculopathy, cervical region: Secondary | ICD-10-CM

## 2022-11-22 NOTE — Progress Notes (Signed)
Orthopedic Spine Surgery Office Note  Assessment: Patient is a 42 y.o. female with neck pain that radiates into the right upper extremity in multiple distributions. Has responded to injections in the past but they are no longer lasting   Plan: -Explained that initially conservative treatment is tried as a significant number of patients may experience relief with these treatment modalities. Discussed that the conservative treatments include:  -activity modification  -physical therapy  -over the counter pain medications  -medrol dosepak  -cervical steroid injections -Patient has tried PT, gabapentin, cervical steroid injections -Discussed using the gabapentin she has three times per day and adding tylenol 1000mg  TID to help with pain as well -Would need to be nicotine free prior to any elective spine surgery -Patient should return to office in 4 weeks, x-rays at next visit: none   Patient expressed understanding of the plan and all questions were answered to the patient's satisfaction.   ___________________________________________________________________________   History:  Patient is a 42 y.o. female who presents today for cervical spine.  Patient has had 2 years of neck pain that radiates into the right upper extremity.  She feels it along the medial aspect and anterior aspect of the arm.  It then radiates into the radial aspect of the forearm.  It has gotten progressively worse and now radiates into the hand.  She feels that go into the thumb, index, and long finger.  She has developed a decrease sensation in those fingers as well.  She states in the last couple months she started dropping objects with the right hand.  She has also noticed some tremors especially of the long finger.  She does not have any symptoms in the left upper extremity.  She has not noticed any fine motor skill coordination issues in the left upper extremity.  Has gotten cervical steroid injections in the past that do  provide her with significant relief.  She has had several of these and they have become less effective with time.  Her last 1 only lasted 3 weeks.   Weakness: Yes, feels that her hand is weaker on the right side.  No other weakness noted Difficulty with fine motor skills (e.g., buttoning shirts, handwriting): Yes, has been dropping objects with the right arm.  No issues with the left upper extremity Symptoms of imbalance: Denies Paresthesias and numbness: Yes, has decreased sensation in the right thumb, index, and long finger.  No other numbness or paresthesias Bowel or bladder incontinence: Denies Saddle anesthesia: Denies  Treatments tried: PT, gabapentin, cervical steroid injections  Review of systems: Denies fevers and chills, night sweats, unexplained weight loss, history of cancer.  Has had pain that wakes her at night  Past medical history: Fibroids GERD  Allergies: NKDA  Past surgical history:  C section Left knee arthroscopy Parathyroidectomy  Social history: Reports use of nicotine product (smoking, vaping, patches, smokeless) Alcohol use: rare Denies recreational drug use   Physical Exam:  BMI of 26.5  General: no acute distress, appears stated age Neurologic: alert, answering questions appropriately, following commands Respiratory: unlabored breathing on room air, symmetric chest rise Psychiatric: appropriate affect, normal cadence to speech   MSK (spine):  -Strength exam      Left  Right Grip strength                5/5  5/5 Interosseus   5/5   5/5 Wrist extension  5/5  5/5 Wrist flexion   5/5  5/5 Elbow flexion   5/5  5/5 Deltoid    5/5  5/5  -Sensory exam    Sensation intact to light touch in C5-T1 nerve distributions of bilateral upper extremities  -Brachioradialis DTR: 2/4 on the left, 2/4 on the right -Biceps DTR: 2/4 on the left, 2/4 on the right  -Spurling: negative bilaterally -Hoffman sign: negative bilaterally -Clonus: no beats  bilaterally -Interosseous wasting: none seen -Grip and release test: negative -Gait: not evaluated since she has had recent foot procedure and is using crutches since the procedure  Left shoulder exam: no pain through range of motion Right shoulder exam: no pian through range of motion, negative jobe, negative belly press, no weakness with external rotation with arm at side  Tinel's at wrist: negative bilaterally Phalen's at wrist: negative bilaterally Durkan's: negative bilaterally  Tinel's at elbow: negative bilaterally  Imaging: XRs of the cervical spine from 11/22/2022 was independently reviewed and interpreted, showing disc height loss with anterior osteophyte formation at C4/5, C5/6, C6/7.  Neutral alignment.  No evidence of instability on flexion/extension views.  No fracture or dislocation seen.  MRI of the cervical spine from 07/31/2020 was independently reviewed and interpreted, showing right-sided foraminal stenosis at C4/5. Bilateral foraminal stenosis at C5/6. No other stenosis seen. No T2 cord signal change.    Patient name: Katie Benson Patient MRN: [id number] Date of visit: 11/22/22

## 2022-11-27 ENCOUNTER — Ambulatory Visit (INDEPENDENT_AMBULATORY_CARE_PROVIDER_SITE_OTHER): Payer: Medicaid Other | Admitting: Orthopedic Surgery

## 2022-11-27 DIAGNOSIS — L03032 Cellulitis of left toe: Secondary | ICD-10-CM | POA: Diagnosis not present

## 2022-11-28 ENCOUNTER — Encounter: Payer: Self-pay | Admitting: Orthopedic Surgery

## 2022-11-28 NOTE — Progress Notes (Signed)
Office Visit Note   Patient: Katie Benson           Date of Birth: [birth year]           MRN: [id number] Visit Date: 11/27/2022              Requested by: No referring provider defined for this encounter. PCP: Default, Provider, MD  Chief Complaint  Patient presents with   Left Foot - Follow-up    1 week s/p nail removal       HPI: Patient is a 42 year old woman who presents in follow-up status post nail excision left foot fifth toe for paronychial infection.  Assessment & Plan: Visit Diagnoses:  1. Paronychia of fifth toe of left foot     Plan: Patient will advance to regular shoewear no restrictions.  Follow-Up Instructions: Return if symptoms worsen or fail to improve.   Ortho Exam  Patient is alert, oriented, no adenopathy, well-dressed, normal affect, normal respiratory effort. Examination the toe has no swelling no redness no drainage no cellulitis no signs of infection.  Imaging: No results found. No images are attached to the encounter.  Labs: Lab Results  Component Value Date   REPTSTATUS 01/15/2013 FINAL 01/14/2013   CULT  01/14/2013    Multiple bacterial morphotypes present, none predominant. Suggest appropriate recollection if clinically indicated. Performed at [facility]     Lab Results  Component Value Date   ALBUMIN 4.4 07/07/2021   ALBUMIN 4.0 03/18/2020   ALBUMIN 4.1 03/18/2018    Lab Results  Component Value Date   MG 2.1 12/07/2017   Lab Results  Component Value Date   VD25OH 7.6 (L) 12/07/2017    No results found for: "PREALBUMIN"    Latest Ref Rng & Units 07/07/2021    2:25 PM 03/18/2020   12:08 PM 12/19/2018    9:01 AM  CBC EXTENDED  WBC 3.4 - 10.8 x10E3/uL 9.9  10.7  11.1   RBC 3.77 - 5.28 x10E6/uL 4.85  4.62  5.33   Hemoglobin 11.1 - 15.9 g/dL 40.9  81.1  91.4   HCT 34.0 - 46.6 % 38.0  35.1  40.1   Platelets 150 - 450 x10E3/uL 318  327  375   NEUT# 1.4 - 7.0 x10E3/uL 6.5     Lymph# 0.7 - 3.1  x10E3/uL 2.7        There is no height or weight on file to calculate BMI.  Orders:  No orders of the defined types were placed in this encounter.  No orders of the defined types were placed in this encounter.    Procedures: No procedures performed  Clinical Data: No additional findings.  ROS:  All other systems negative, except as noted in the HPI. Review of Systems  Objective: Vital Signs: There were no vitals taken for this visit.  Specialty Comments:  MRI CERVICAL SPINE WITHOUT CONTRAST   TECHNIQUE: Multiplanar, multisequence MR imaging of the cervical spine was performed. No intravenous contrast was administered.   COMPARISON:  CT scan 12/26/2012   FINDINGS: Alignment: No vertebral subluxation is observed.   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 Disc desiccation at all levels between C2 and C7.   Cord: No significant abnormal spinal cord signal is observed.   Posterior Fossa, vertebral arteries, paraspinal tissues: Unremarkable   Disc levels:   C2-3: Unremarkable.   C3-4: Borderline bilateral foraminal stenosis due to disc bulge and uncinate spurring.   C4-5: Moderate to prominent bilateral foraminal  stenosis and borderline central narrowing of the thecal sac due to disc bulge and uncinate spurring.   C5-6: Moderate right and moderate to prominent left foraminal stenosis and borderline central narrowing of the thecal sac due to disc bulge and uncinate spurring.   C6-7: Mild right and moderate left foraminal stenosis due to disc bulge and uncinate spurring.   C7-T1: No impingement.  Minimal uncinate spurring.   T1-2: Unremarkable.   IMPRESSION: 1. Cervical spondylosis, degenerative disc disease, and mildly congenitally short pedicles contribute to moderate to prominent impingement at C4-5; moderate impingement at C5-6; and mild impingement at C6-7, as detailed above.     Electronically Signed   By: Gaylyn Rong M.D.   On: 08/01/2020 09:25  ---------------------------------------------- EXAM: MRI LUMBAR SPINE WITHOUT CONTRAST   TECHNIQUE: Multiplanar, multisequence MR imaging of the lumbar spine was performed. No intravenous contrast was administered.   COMPARISON:  Lumbar spine x-rays dated June 23, 2021. CT abdomen pelvis dated March 26, 2020.   FINDINGS: Segmentation:  Standard.   Alignment:  Physiologic.   Vertebrae:  No fracture, evidence of discitis, or bone lesion.   Conus medullaris and cauda equina: Conus extends to the L1-L2 level. Conus and cauda equina appear normal.   Paraspinal and other soft tissues: Negative.   Disc levels:   T11-T12 to L2-L3: Negative.   L3-L4: Mild disc bulging and bilateral facet arthropathy. Mild bilateral neuroforaminal stenosis. No spinal canal stenosis.   L4-L5: Negative disc. Mild bilateral facet arthropathy. Mild bilateral neuroforaminal stenosis. No spinal canal stenosis.   L5-S1: Negative disc. Mild bilateral facet arthropathy. No stenosis.   IMPRESSION: 1. Mild degenerative changes of the lower lumbar spine as described above. No high-grade stenosis or impingement.     Electronically Signed   By: Obie Dredge M.D.   On: 08/19/2021 16:36  PMFS History: Patient Active Problem List   Diagnosis Date Noted   History of esophageal ulcer 12/22/2021   Ingrown nail of fifth toe 12/22/2021   Chronic toe pain, left foot 12/22/2021   Blurry vision, bilateral 12/22/2021   Left corneal abrasion 12/22/2021   Osteoarthritis of left knee 12/14/2020   Subacromial bursitis of right shoulder joint 07/15/2020   Cervical radiculopathy 06/24/2020   Effusion, left knee 06/24/2019   Pes anserine bursitis 04/29/2019   Bucket handle tear of lateral meniscus of left knee 12/12/2018   Ganglion cyst 12/12/2018   Fibroids 03/18/2018   S/P parathyroidectomy 02/12/2018   Primary hyperparathyroidism (HCC) 01/08/2018    Hyperparathyroidism , secondary, non-renal (HCC) 01/08/2018   Fibroid uterus 12/06/2017   Hypercalcemia 12/06/2017   Fibroid 10/15/2017   Abnormal uterine bleeding (AUB) 08/20/2017   Anemia 08/20/2017   Past Medical History:  Diagnosis Date   Anemia    Arthritis    Fibroids    GERD (gastroesophageal reflux disease)    History of kidney stones    Neck injury     Family History  Problem Relation Age of Onset   Diabetes Sister    Cancer Maternal Grandmother        ovarian   Hypercalcemia Mother    Other Father        HX Unknown    Past Surgical History:  Procedure Laterality Date   CESAREAN SECTION     IR ANGIOGRAM PELVIS SELECTIVE OR SUPRASELECTIVE  03/18/2018   IR ANGIOGRAM PELVIS SELECTIVE OR SUPRASELECTIVE  03/18/2018   IR ANGIOGRAM SELECTIVE EACH ADDITIONAL VESSEL  03/18/2018   IR ANGIOGRAM SELECTIVE EACH ADDITIONAL VESSEL  03/18/2018   IR EMBO TUMOR ORGAN ISCHEMIA INFARCT INC GUIDE ROADMAPPING  03/18/2018   IR RADIOLOGIST EVAL & MGMT  10/17/2017   IR RADIOLOGIST EVAL & MGMT  12/20/2017   IR RADIOLOGIST EVAL & MGMT  04/17/2018   IR US GUIDE VASC ACCESS RIGHT  03/18/2018   KNEE ARTHROSCOPY WITH LATERAL MENISECTOMY Left 12/24/2018   Procedure: LEFT KNEE ARTHROSCOPY WITH LATERAL MENISECTOM, debriment of GANGLION CYST;  Surgeon: Valeria Batman, MD;  Location: [facility];  Service: Orthopedics;  Laterality: Left;   PARATHYROIDECTOMY N/A 02/12/2018   Procedure: PARATHYROIDECTOMY;  Surgeon: Duanne Guess, MD;  Location: [facility];  Service: General;  Laterality: N/A;   WISDOM TOOTH EXTRACTION     age 48   Social History   Occupational History   Occupation: unemployed  Tobacco Use   Smoking status: Some Days    Current packs/day: 0.25    Average packs/day: 0.3 packs/day for 14.0 years (3.5 ttl pk-yrs)    Types: Cigarettes   Smokeless tobacco: Never  Vaping Use   Vaping status: Never Used  Substance and Sexual Activity   Alcohol use: Yes    Comment: occassionally on  weekends   Drug use: Yes    Types: Marijuana    Comment: multiple times a day   Sexual activity: Yes    Partners: Male    Birth control/protection: Condom

## 2022-11-29 ENCOUNTER — Other Ambulatory Visit: Payer: Self-pay

## 2022-12-15 ENCOUNTER — Other Ambulatory Visit: Payer: Self-pay

## 2022-12-15 ENCOUNTER — Other Ambulatory Visit: Payer: Self-pay | Admitting: Surgical

## 2022-12-15 ENCOUNTER — Other Ambulatory Visit (HOSPITAL_COMMUNITY): Payer: Self-pay

## 2022-12-15 MED ORDER — CYCLOBENZAPRINE HCL 10 MG PO TABS: 10.0000 mg | ORAL_TABLET | Freq: Three times a day (TID) | ORAL | 1 refills | Status: DC

## 2022-12-15 MED ORDER — GABAPENTIN 300 MG PO CAPS: 300.0000 mg | ORAL_CAPSULE | Freq: Three times a day (TID) | ORAL | 1 refills | Status: DC

## 2022-12-15 NOTE — Telephone Encounter (Signed)
rxed

## 2022-12-18 ENCOUNTER — Other Ambulatory Visit: Payer: Self-pay | Admitting: Gastroenterology

## 2022-12-18 ENCOUNTER — Other Ambulatory Visit: Payer: Self-pay

## 2022-12-18 ENCOUNTER — Other Ambulatory Visit: Payer: Self-pay | Admitting: Orthopedic Surgery

## 2022-12-18 MED ORDER — DESIPRAMINE HCL 50 MG PO TABS
50.0000 mg | ORAL_TABLET | Freq: Every day | ORAL | 0 refills | Status: DC
  Filled 2022-12-18: qty 90, 90d supply, fill #0
  Filled 2022-12-22: qty 30, 30d supply, fill #0
  Filled 2023-01-13: qty 90, 90d supply, fill #0

## 2022-12-18 MED ORDER — CYCLOBENZAPRINE HCL 10 MG PO TABS
10.0000 mg | ORAL_TABLET | Freq: Three times a day (TID) | ORAL | 1 refills | Status: DC
  Filled 2022-12-18: qty 90, 30d supply, fill #0
  Filled 2023-01-16: qty 90, 30d supply, fill #1

## 2022-12-18 MED ORDER — GABAPENTIN 300 MG PO CAPS
300.0000 mg | ORAL_CAPSULE | Freq: Three times a day (TID) | ORAL | 1 refills | Status: DC
  Filled 2022-12-18: qty 90, 30d supply, fill #0
  Filled 2023-01-16: qty 90, 30d supply, fill #1

## 2022-12-20 ENCOUNTER — Other Ambulatory Visit: Payer: Self-pay

## 2022-12-21 ENCOUNTER — Other Ambulatory Visit (HOSPITAL_COMMUNITY): Payer: Self-pay

## 2022-12-22 ENCOUNTER — Ambulatory Visit: Payer: Medicaid Other | Admitting: Orthopedic Surgery

## 2022-12-22 ENCOUNTER — Other Ambulatory Visit: Payer: Self-pay

## 2022-12-22 DIAGNOSIS — M5412 Radiculopathy, cervical region: Secondary | ICD-10-CM

## 2022-12-22 NOTE — Progress Notes (Signed)
Orthopedic Spine Surgery Office Note   Assessment: Patient is a 42 y.o. female with neck pain that radiates into the right upper extremity in multiple distributions     Plan: -Patient has tried PT, tylenol, gabapentin, cervical steroid injections -Patient was interested in repeat injection since they have helped in the past, so referral was provided to her today -If this injection does not provide her with relief, then her options would be surgery or pain management -I would want to get a new MRI prior to any surgical intervention since her last one is from 2022 -Would need to be nicotine free prior to any elective spine surgery -Patient should return to office in 4 weeks, x-rays at next visit: none     Patient expressed understanding of the plan and all questions were answered to the patient's satisfaction.    ___________________________________________________________________________     History:   Patient is a 42 y.o. female who presents today for for follow up on her cervical spine. Patient has now had over two years of neck pain that radiates into her right upper extremity. She feels it goes into the medial and anterior aspect of the arm into the radial aspect of the forearm. She feels the pain on a daily basis. She does not have any pain radiating into the left upper extremity. She has not had any issues with fine motor skills in the hands or imbalance. Has had prior injections and feels that they helped her 70-80% so she is interested in another injection.    Treatments tried: PT, gabapentin, cervical steroid injections    Physical Exam:   General: no acute distress, appears stated age Neurologic: alert, answering questions appropriately, following commands Respiratory: unlabored breathing on room air, symmetric chest rise Psychiatric: appropriate affect, normal cadence to speech     MSK (spine):   -Strength exam                                                   Left                   Right Grip strength                5/5                  5/5 Interosseus                  5/5                  5/5 Wrist extension            5/5                  5/5 Wrist flexion                 5/5                  5/5 Elbow flexion                5/5                  5/5 Deltoid                          5/5  5/5   -Sensory exam                           Sensation intact to light touch in C5-T1 nerve distributions of bilateral upper extremities   -Brachioradialis DTR: 2/4 on the left, 2/4 on the right -Biceps DTR: 2/4 on the left, 2/4 on the right    Imaging: XRs of the cervical spine from 11/22/2022 were previously independently reviewed and interpreted, showing disc height loss with anterior osteophyte formation at C4/5, C5/6, C6/7.  Neutral alignment.  No evidence of instability on flexion/extension views.  No fracture or dislocation seen.     Patient name: Katie Benson Patient MRN: [id number] Date of visit: 12/22/22

## 2023-01-03 ENCOUNTER — Other Ambulatory Visit: Payer: Self-pay

## 2023-01-09 ENCOUNTER — Ambulatory Visit (INDEPENDENT_AMBULATORY_CARE_PROVIDER_SITE_OTHER): Payer: Medicaid Other | Admitting: Physical Medicine and Rehabilitation

## 2023-01-09 ENCOUNTER — Other Ambulatory Visit: Payer: Self-pay

## 2023-01-09 DIAGNOSIS — M5412 Radiculopathy, cervical region: Secondary | ICD-10-CM | POA: Diagnosis not present

## 2023-01-09 MED ORDER — METHYLPREDNISOLONE ACETATE 40 MG/ML IJ SUSP
40.0000 mg | Freq: Once | INTRAMUSCULAR | Status: AC
  Administered 2023-01-09: 40 mg

## 2023-01-09 NOTE — Progress Notes (Signed)
Katie Benson - 42 y.o. female MRN [id number]  Date of birth: Nov 10, 1980  Office Visit Note: Visit Date: 01/09/2023 PCP: Default, Provider, MD Referred by: London Sheer, MD  Subjective: No chief complaint on file.  HPI:  Katie Benson is a 42 y.o. female who comes in today at the request of Dr. Willia Craze for planned Right C7-T1 Cervical Interlaminar epidural steroid injection with fluoroscopic guidance.  The patient has failed conservative care including home exercise, medications, time and activity modification.  This injection will be diagnostic and hopefully therapeutic.  Please see requesting physician notes for further details and justification.   ROS Otherwise per HPI.  Assessment & Plan: Visit Diagnoses:    ICD-10-CM   1. Cervical radiculopathy  M54.12 methylPREDNISolone acetate (DEPO-MEDROL) injection 40 mg    XR C-ARM NO REPORT    Epidural Steroid injection      Plan: No additional findings.   Meds & Orders:  Meds ordered this encounter  Medications   methylPREDNISolone acetate (DEPO-MEDROL) injection 40 mg    Orders Placed This Encounter  Procedures   XR C-ARM NO REPORT   Epidural Steroid injection    Follow-up: Return for visit to requesting provider as needed.   Procedures: No procedures performed  Cervical Epidural Steroid Injection - Interlaminar Approach with Fluoroscopic Guidance  Patient: Katie Benson      Date of Birth: 05-11-1980 MRN: [id number] PCP: Default, Provider, MD      Visit Date: 01/09/2023   Universal Protocol:    Date/Time: 11/12/241:10 PM  Consent Given By: the patient  Position: PRONE  Additional Comments: Vital signs were monitored before and after the procedure. Patient was prepped and draped in the usual sterile fashion. The correct patient, procedure, and site was verified.   Injection Procedure Details:   Procedure diagnoses: Cervical radiculopathy [M54.12]    Meds Administered:   Meds ordered this encounter  Medications   methylPREDNISolone acetate (DEPO-MEDROL) injection 40 mg     Laterality: Right  Location/Site: C7-T1  Needle: 3.5 in., 20 ga. Tuohy  Needle Placement: Paramedian epidural space  Findings:  -Comments: Excellent flow of contrast into the epidural space.  Procedure Details: Using a paramedian approach from the side mentioned above, the region overlying the inferior lamina was localized under fluoroscopic visualization and the soft tissues overlying this structure were infiltrated with 4 ml. of 1% Lidocaine without Epinephrine. A # 20 gauge, Tuohy needle was inserted into the epidural space using a paramedian approach.  The epidural space was localized using loss of resistance along with contralateral oblique bi-planar fluoroscopic views.  After negative aspirate for air, blood, and CSF, a 2 ml. volume of Isovue-250 was injected into the epidural space and the flow of contrast was observed. Radiographs were obtained for documentation purposes.   The injectate was administered into the level noted above.  Additional Comments:  The patient tolerated the procedure well Dressing: 2 x 2 sterile gauze and Band-Aid    Post-procedure details: Patient was observed during the procedure. Post-procedure instructions were reviewed.  Patient left the clinic in stable condition.   Clinical History: MRI CERVICAL SPINE WITHOUT CONTRAST   TECHNIQUE: Multiplanar, multisequence MR imaging of the cervical spine was performed. No intravenous contrast was administered.   COMPARISON:  CT scan 12/26/2012   FINDINGS: Alignment: No vertebral subluxation is observed.   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 Disc desiccation at all levels between C2 and C7.   Cord: No significant abnormal spinal  cord signal is observed.   Posterior Fossa, vertebral arteries, paraspinal tissues: Unremarkable   Disc levels:   C2-3:  Unremarkable.   C3-4: Borderline bilateral foraminal stenosis due to disc bulge and uncinate spurring.   C4-5: Moderate to prominent bilateral foraminal stenosis and borderline central narrowing of the thecal sac due to disc bulge and uncinate spurring.   C5-6: Moderate right and moderate to prominent left foraminal stenosis and borderline central narrowing of the thecal sac due to disc bulge and uncinate spurring.   C6-7: Mild right and moderate left foraminal stenosis due to disc bulge and uncinate spurring.   C7-T1: No impingement.  Minimal uncinate spurring.   T1-2: Unremarkable.   IMPRESSION: 1. Cervical spondylosis, degenerative disc disease, and mildly congenitally short pedicles contribute to moderate to prominent impingement at C4-5; moderate impingement at C5-6; and mild impingement at C6-7, as detailed above.     Electronically Signed   By: Gaylyn Rong M.D.   On: 08/01/2020 09:25  ---------------------------------------------- EXAM: MRI LUMBAR SPINE WITHOUT CONTRAST   TECHNIQUE: Multiplanar, multisequence MR imaging of the lumbar spine was performed. No intravenous contrast was administered.   COMPARISON:  Lumbar spine x-rays dated June 23, 2021. CT abdomen pelvis dated March 26, 2020.   FINDINGS: Segmentation:  Standard.   Alignment:  Physiologic.   Vertebrae:  No fracture, evidence of discitis, or bone lesion.   Conus medullaris and cauda equina: Conus extends to the L1-L2 level. Conus and cauda equina appear normal.   Paraspinal and other soft tissues: Negative.   Disc levels:   T11-T12 to L2-L3: Negative.   L3-L4: Mild disc bulging and bilateral facet arthropathy. Mild bilateral neuroforaminal stenosis. No spinal canal stenosis.   L4-L5: Negative disc. Mild bilateral facet arthropathy. Mild bilateral neuroforaminal stenosis. No spinal canal stenosis.   L5-S1: Negative disc. Mild bilateral facet arthropathy. No stenosis.    IMPRESSION: 1. Mild degenerative changes of the lower lumbar spine as described above. No high-grade stenosis or impingement.     Electronically Signed   By: Obie Dredge M.D.   On: 08/19/2021 16:36     Objective:  VS:  HT:    WT:   BMI:     BP:   HR: bpm  TEMP: ( )  RESP:  Physical Exam Vitals and nursing note reviewed.  Constitutional:      General: She is not in acute distress.    Appearance: Normal appearance. She is not ill-appearing.  HENT:     Head: Normocephalic and atraumatic.     Right Ear: External ear normal.     Left Ear: External ear normal.  Eyes:     Extraocular Movements: Extraocular movements intact.  Cardiovascular:     Rate and Rhythm: Normal rate.     Pulses: Normal pulses.  Musculoskeletal:     Cervical back: Tenderness present. No rigidity.     Right lower leg: No edema.     Left lower leg: No edema.     Comments: Patient has good strength in the upper extremities including 5 out of 5 strength in wrist extension long finger flexion and APB.  There is no atrophy of the hands intrinsically.  There is a negative Hoffmann's test.   Lymphadenopathy:     Cervical: No cervical adenopathy.  Skin:    Findings: No erythema, lesion or rash.  Neurological:     General: No focal deficit present.     Mental Status: She is alert and oriented to person, place, and time.  Sensory: No sensory deficit.     Motor: No weakness or abnormal muscle tone.     Coordination: Coordination normal.  Psychiatric:        Mood and Affect: Mood normal.        Behavior: Behavior normal.      Imaging: XR C-ARM NO REPORT  Result Date: 01/09/2023 Please see Notes tab for imaging impression.

## 2023-01-09 NOTE — Procedures (Signed)
Cervical Epidural Steroid Injection - Interlaminar Approach with Fluoroscopic Guidance  Patient: Katie Benson      Date of Birth: 03-07-1980 MRN: [id number] PCP: Default, Provider, MD      Visit Date: 01/09/2023   Universal Protocol:    Date/Time: 11/12/241:10 PM  Consent Given By: the patient  Position: PRONE  Additional Comments: Vital signs were monitored before and after the procedure. Patient was prepped and draped in the usual sterile fashion. The correct patient, procedure, and site was verified.   Injection Procedure Details:   Procedure diagnoses: Cervical radiculopathy [M54.12]    Meds Administered:  Meds ordered this encounter  Medications   methylPREDNISolone acetate (DEPO-MEDROL) injection 40 mg     Laterality: Right  Location/Site: C7-T1  Needle: 3.5 in., 20 ga. Tuohy  Needle Placement: Paramedian epidural space  Findings:  -Comments: Excellent flow of contrast into the epidural space.  Procedure Details: Using a paramedian approach from the side mentioned above, the region overlying the inferior lamina was localized under fluoroscopic visualization and the soft tissues overlying this structure were infiltrated with 4 ml. of 1% Lidocaine without Epinephrine. A # 20 gauge, Tuohy needle was inserted into the epidural space using a paramedian approach.  The epidural space was localized using loss of resistance along with contralateral oblique bi-planar fluoroscopic views.  After negative aspirate for air, blood, and CSF, a 2 ml. volume of Isovue-250 was injected into the epidural space and the flow of contrast was observed. Radiographs were obtained for documentation purposes.   The injectate was administered into the level noted above.  Additional Comments:  The patient tolerated the procedure well Dressing: 2 x 2 sterile gauze and Band-Aid    Post-procedure details: Patient was observed during the procedure. Post-procedure instructions were  reviewed.  Patient left the clinic in stable condition.

## 2023-01-09 NOTE — Patient Instructions (Signed)

## 2023-01-13 ENCOUNTER — Other Ambulatory Visit: Payer: Self-pay | Admitting: Gastroenterology

## 2023-01-15 ENCOUNTER — Other Ambulatory Visit: Payer: Self-pay

## 2023-01-16 ENCOUNTER — Other Ambulatory Visit: Payer: Self-pay

## 2023-01-16 ENCOUNTER — Other Ambulatory Visit: Payer: Self-pay | Admitting: Gastroenterology

## 2023-01-17 ENCOUNTER — Telehealth: Payer: Self-pay | Admitting: Surgical

## 2023-01-17 ENCOUNTER — Other Ambulatory Visit: Payer: Self-pay | Admitting: Gastroenterology

## 2023-01-17 ENCOUNTER — Other Ambulatory Visit: Payer: Self-pay

## 2023-01-17 MED ORDER — PANTOPRAZOLE SODIUM 40 MG PO TBEC
40.0000 mg | DELAYED_RELEASE_TABLET | Freq: Every day | ORAL | 3 refills | Status: DC
  Filled 2023-01-17 – 2023-02-13 (×2): qty 90, 90d supply, fill #0

## 2023-01-17 NOTE — Telephone Encounter (Signed)
I would say that she should be okay for a note stating that she should not walk for excessively long periods of time or stand for long periods of time but I do not think that the knee arthritis that she has should necessarily have to keep her from working altogether.  I think she should be able to do some type of work that is more sedentary or sit down work.

## 2023-01-17 NOTE — Telephone Encounter (Signed)
Yeah that's okay

## 2023-01-17 NOTE — Telephone Encounter (Signed)
Pt stated she needs a letter stating she cannot walk or stand for long periods of time because of her left knee pain, per her same restrictions she had on her handicap placard

## 2023-01-18 ENCOUNTER — Encounter (HOSPITAL_BASED_OUTPATIENT_CLINIC_OR_DEPARTMENT_OTHER): Payer: Self-pay

## 2023-01-18 NOTE — Telephone Encounter (Signed)
Note completed and sent to pt's mychart 

## 2023-01-18 NOTE — Telephone Encounter (Signed)
Lvm advising  

## 2023-01-26 ENCOUNTER — Other Ambulatory Visit: Payer: Self-pay

## 2023-02-13 ENCOUNTER — Other Ambulatory Visit: Payer: Self-pay | Admitting: Surgical

## 2023-02-13 ENCOUNTER — Other Ambulatory Visit: Payer: Self-pay

## 2023-02-13 ENCOUNTER — Other Ambulatory Visit: Payer: Self-pay | Admitting: Gastroenterology

## 2023-02-13 MED ORDER — GABAPENTIN 300 MG PO CAPS
300.0000 mg | ORAL_CAPSULE | Freq: Three times a day (TID) | ORAL | 1 refills | Status: DC
  Filled 2023-02-13 – 2023-03-01 (×2): qty 90, 30d supply, fill #0
  Filled 2023-04-05: qty 90, 30d supply, fill #1

## 2023-02-13 MED ORDER — CYCLOBENZAPRINE HCL 10 MG PO TABS
10.0000 mg | ORAL_TABLET | Freq: Three times a day (TID) | ORAL | 1 refills | Status: DC
  Filled 2023-02-13 – 2023-03-01 (×2): qty 90, 30d supply, fill #0
  Filled 2023-04-05: qty 90, 30d supply, fill #1

## 2023-02-15 ENCOUNTER — Other Ambulatory Visit: Payer: Self-pay

## 2023-02-15 MED ORDER — DESIPRAMINE HCL 50 MG PO TABS
50.0000 mg | ORAL_TABLET | Freq: Every day | ORAL | 0 refills | Status: DC
  Filled 2023-02-15: qty 90, 90d supply, fill #0

## 2023-02-22 ENCOUNTER — Other Ambulatory Visit: Payer: Self-pay

## 2023-02-28 NOTE — Progress Notes (Signed)
 Ellouise Console, PA-C [address]  [address]  [place], [place] [address]  Main: [phone]  Fax: [phone]   Primary Care Physician: Default, Provider, MD  Primary Gastroenterologist:  Ellouise Console, PA-C / Dr. Ruel Kung    CC:  F/U IBS-C, GERD w/ esophagitis, Hemorrhoids  HPI: Katie Benson is a 43 y.o. female returns for follow-up of IBS with constipation, hemorrhoids, GERD with esophagitis, and history of esophageal ulcers.  She ran out of pantoprazole  a few weeks ago and has recurrent generalized abdominal pain.  She is no longer taking Colace stool softener.  Admits to recurrent constipation.  Hemorrhoids improved post banding procedure, however she has noticed mild hemorrhoids swelling since she has been constipated.  She denies rectal bleeding.  She is requesting refill on all her GI medications.  Desipramine  helps her IBS.  She did not tolerate hyoscyamine  which caused dry eyes.  She cannot afford OTC meds.  She needs prescriptions sent to [facility].  06/2020 Colonoscopy: by Dr. Shila showed large internal and external hemorrhoids.  A few scattered nonbleeding erosions at the rectum.  1 small 5 mm hyperplastic rectal polyp removed.  Good prep.  Repeat screening colonoscopy in 10 years.  06/2020 EGD: LA Grade C Esophagitis; Few cratered superficial esophageal ulcers.  Normal stomach and duodenum.  Biopsies negative for Barrett's.  She underwent hemorrhoid banding by Dr. Kung in 2023. First round:01/17/2022: LL column banded Second round: 02/16/2022: RA column banded   In the past her GI symptoms greatly improved on Colace stool softener 100 mg 1 capsule twice daily, pantoprazole  40 mg daily, desipramine  50 Mg once daily nightly (for IBS), and hydrocortisone  2.5% cream as needed for hemorrhoids.  She request refills on these prescriptions.  Current Outpatient Medications  Medication Sig Dispense Refill   AMBULATORY NON FORMULARY MEDICATION  Medication Name:  Nitroglycerin  ointment 0.125% three times daily use pea sized amount per rectum for 6-8 weeks 30 g 1   cyclobenzaprine  (FLEXERIL ) 10 MG tablet Take 1 tablet (10 mg total) by mouth 3 (three) times daily. 90 tablet 1   doxycycline  (VIBRA -TABS) 100 MG tablet Take 1 tablet (100 mg total) by mouth 2 (two) times daily. 14 tablet 0   gabapentin  (NEURONTIN ) 300 MG capsule Take 1 capsule (300 mg total) by mouth 3 (three) times daily. 90 capsule 1   lidocaine  (LIDODERM ) 5 % Apply 2 patches topically daily as needed. Leave patch on for maximum duration of 12 hours.  Do not place 2 patches on one area within 24 hours 30 patch 1   Lidocaine , Anorectal, (RECTICARE) 5 % CREA Apply 1 Application topically every 4 (four) hours as needed. 28 g 1   Urea  (UREA  NAIL) 45 % GEL Apply 1 Application topically daily. 28 mL 1   zolpidem  (AMBIEN ) 10 MG tablet Take 1 tablet (10mg ) by mouth an hour before procedure with light food. 1 tablet 0   desipramine  (NORPRAMIN ) 50 MG tablet Take 1 tablet (50 mg total) by mouth at bedtime. MUST SCHEDULE OFFICE VISIT 90 tablet 3   docusate sodium  (STOOL SOFTENER) 100 MG capsule Take 1 capsule (100 mg total) by mouth 2 (two) times daily. 180 capsule 3   hydrocortisone  (ANUSOL -HC) 2.5 % rectal cream Place 1 Application rectally 2 (two) times daily as needed for hemorrhoids or anal itching. 30 g 0   pantoprazole  (PROTONIX ) 40 MG tablet Take 1 tablet (40 mg total) by mouth daily. 90 tablet 3   Current Facility-Administered Medications  Medication Dose Route Frequency Provider Last Rate Last Admin   0.9 %  sodium chloride  infusion  500 mL Intravenous Once Nandigam, Kavitha V, MD        Allergies as of 03/01/2023 - Review Complete 03/01/2023  Allergen Reaction Noted   Pork-derived products Hives and Swelling 03/08/2012    Past Medical History:  Diagnosis Date   Anemia    Arthritis    Fibroids    GERD (gastroesophageal reflux disease)    History of kidney stones     Neck injury     Past Surgical History:  Procedure Laterality Date   CESAREAN SECTION     IR ANGIOGRAM PELVIS SELECTIVE OR SUPRASELECTIVE  03/18/2018   IR ANGIOGRAM PELVIS SELECTIVE OR SUPRASELECTIVE  03/18/2018   IR ANGIOGRAM SELECTIVE EACH ADDITIONAL VESSEL  03/18/2018   IR ANGIOGRAM SELECTIVE EACH ADDITIONAL VESSEL  03/18/2018   IR EMBO TUMOR ORGAN ISCHEMIA INFARCT INC GUIDE ROADMAPPING  03/18/2018   IR RADIOLOGIST EVAL & MGMT  10/17/2017   IR RADIOLOGIST EVAL & MGMT  12/20/2017   IR RADIOLOGIST EVAL & MGMT  04/17/2018   IR US  GUIDE VASC ACCESS RIGHT  03/18/2018   KNEE ARTHROSCOPY WITH LATERAL MENISECTOMY Left 12/24/2018   Procedure: LEFT KNEE ARTHROSCOPY WITH LATERAL MENISECTOM, debriment of GANGLION CYST;  Surgeon: Anderson Maude ORN, MD;  Location: [facility];  Service: Orthopedics;  Laterality: Left;   PARATHYROIDECTOMY N/A 02/12/2018   Procedure: PARATHYROIDECTOMY;  Surgeon: Marolyn Nest, MD;  Location: [facility];  Service: General;  Laterality: N/A;   WISDOM TOOTH EXTRACTION     age 69    Review of Systems:    All systems reviewed and negative except where noted in HPI.   Physical Examination:   BP 123/69   Pulse (!) 125   Temp 98.3 F (36.8 C)   Ht 5' 2 (1.575 m)   Wt 146 lb 9.6 oz (66.5 kg)   BMI 26.81 kg/m   General: Well-nourished, well-developed in no acute distress.  Lungs: Clear to auscultation bilaterally. Non-labored. Heart: Regular rate and rhythm, no murmurs rubs or gallops.  Abdomen: Bowel sounds are normal; Abdomen is Soft; No hepatosplenomegaly, masses or hernias; mild to moderate periumbilical abdominal Tenderness; rest of abdomen is not tender; no guarding or rebound tenderness. Neuro: Alert and oriented x 3.  Grossly intact.  Psych: Alert and cooperative; anxious / emotional mood and affect.   Imaging Studies: No results found.  Assessment and Plan:   [name] is a 43 y.o. y/o female returns for follow-up of:  1.  Irritable bowel  syndrome with constipation  Rx Colace (docusate sodium ) stool softener 100 mg 1 tablet twice daily, # 180, 3 refills.  Refill desipramine  50 Mg 1 tablet nightly, #90, 3 refills.  Recommend high-fiber diet with 30 g of fiber daily.  Drink 64 ounces of fluids daily.  2.  Hemorrhoids: Internal and external  Rx Anusol  2.5% cream apply 2-3 times a day to rectum as needed for hemorrhoid symptoms, max of 2 weeks, number 30 g no refills.  Return for repeat internal hemorrhoid banding if symptoms worsen or persist in spite of conservative treatment.  3.  GERD with Esophagitis  Rx pantoprazole  40 Mg 1 tablet once daily, #90, 3 refills.  Recommend Lifestyle Modifications to prevent Acid Reflux.  Rec. Avoid coffee, sodas, peppermint, garlic, onions, alcohol, citrus fruits, chocolate, tomatoes, fatty and spicey foods.  Avoid eating 2-3 hours before bedtime.    4.  Hx Esophageal Ulcers  Rx  pantoprazole  40 Mg 1 tablet once daily, #90, 3 refills.  Avoid NSAIDs - list given.  Okay to take Tylenol  or acetaminophen  if needed.   Ellouise Console, PA-C  Follow up in 1 year or sooner if symptoms worsen.

## 2023-03-01 ENCOUNTER — Other Ambulatory Visit: Payer: Self-pay

## 2023-03-01 ENCOUNTER — Encounter: Payer: Self-pay | Admitting: Physician Assistant

## 2023-03-01 ENCOUNTER — Ambulatory Visit: Payer: Medicaid Other | Admitting: Physician Assistant

## 2023-03-01 VITALS — BP 123/69 | HR 125 | Temp 98.3°F | Ht 62.0 in | Wt 146.6 lb

## 2023-03-01 DIAGNOSIS — Z8711 Personal history of peptic ulcer disease: Secondary | ICD-10-CM | POA: Diagnosis not present

## 2023-03-01 DIAGNOSIS — K581 Irritable bowel syndrome with constipation: Secondary | ICD-10-CM

## 2023-03-01 DIAGNOSIS — K649 Unspecified hemorrhoids: Secondary | ICD-10-CM | POA: Diagnosis not present

## 2023-03-01 DIAGNOSIS — Z8719 Personal history of other diseases of the digestive system: Secondary | ICD-10-CM

## 2023-03-01 DIAGNOSIS — K2100 Gastro-esophageal reflux disease with esophagitis, without bleeding: Secondary | ICD-10-CM | POA: Diagnosis not present

## 2023-03-01 MED ORDER — DOCUSATE SODIUM 100 MG PO CAPS
100.0000 mg | ORAL_CAPSULE | Freq: Two times a day (BID) | ORAL | 3 refills | Status: AC
  Filled 2023-03-01: qty 60, 30d supply, fill #0
  Filled 2023-04-05: qty 60, 30d supply, fill #1
  Filled 2023-05-10: qty 60, 30d supply, fill #2
  Filled 2023-06-06: qty 60, 30d supply, fill #3
  Filled 2023-07-09: qty 60, 30d supply, fill #4
  Filled 2023-08-21 – 2023-09-09 (×2): qty 60, 30d supply, fill #5
  Filled 2023-10-12: qty 60, 30d supply, fill #6
  Filled 2023-11-13: qty 60, 30d supply, fill #7
  Filled 2023-12-13: qty 60, 30d supply, fill #8
  Filled 2024-01-29: qty 60, 30d supply, fill #9
  Filled 2024-02-29: qty 60, 30d supply, fill #10

## 2023-03-01 MED ORDER — PANTOPRAZOLE SODIUM 40 MG PO TBEC
40.0000 mg | DELAYED_RELEASE_TABLET | Freq: Every day | ORAL | 3 refills | Status: AC
  Filled 2023-03-01: qty 90, 90d supply, fill #0
  Filled 2023-05-29 – 2023-06-13 (×2): qty 90, 90d supply, fill #1
  Filled 2023-09-09: qty 90, 90d supply, fill #2
  Filled 2023-12-13: qty 90, 90d supply, fill #3

## 2023-03-01 MED ORDER — DESIPRAMINE HCL 50 MG PO TABS
50.0000 mg | ORAL_TABLET | Freq: Every day | ORAL | 3 refills | Status: AC
  Filled 2023-03-01 – 2023-05-10 (×3): qty 90, 90d supply, fill #0
  Filled 2023-08-05: qty 90, 90d supply, fill #1
  Filled 2023-11-13: qty 90, 90d supply, fill #2
  Filled 2024-02-08: qty 50, 50d supply, fill #3
  Filled 2024-02-08: qty 40, 40d supply, fill #3
  Filled 2024-02-27: qty 90, 90d supply, fill #3

## 2023-03-01 MED ORDER — HYDROCORTISONE (PERIANAL) 2.5 % EX CREA
1.0000 | TOPICAL_CREAM | Freq: Two times a day (BID) | CUTANEOUS | 0 refills | Status: AC | PRN
  Filled 2023-03-01: qty 30, 10d supply, fill #0

## 2023-03-01 NOTE — Patient Instructions (Signed)
 Avoid all NSAIDS such as meloxicam, Mobic, ibuprofen, Advil, Aleve, and Celebrex.  These can cause ulcers.  It is safe to take Tylenol or acetaminophen as needed for pain.

## 2023-04-05 ENCOUNTER — Other Ambulatory Visit: Payer: Self-pay

## 2023-04-09 ENCOUNTER — Other Ambulatory Visit: Payer: Self-pay

## 2023-04-11 ENCOUNTER — Encounter: Payer: Self-pay | Admitting: Surgical

## 2023-04-11 ENCOUNTER — Ambulatory Visit: Payer: Medicaid Other | Admitting: Surgical

## 2023-04-11 DIAGNOSIS — Z758 Other problems related to medical facilities and other health care: Secondary | ICD-10-CM | POA: Diagnosis not present

## 2023-04-11 DIAGNOSIS — M1712 Unilateral primary osteoarthritis, left knee: Secondary | ICD-10-CM | POA: Diagnosis not present

## 2023-04-11 DIAGNOSIS — M79673 Pain in unspecified foot: Secondary | ICD-10-CM

## 2023-04-11 MED ORDER — CYCLOBENZAPRINE HCL 10 MG PO TABS: 10.0000 mg | ORAL_TABLET | Freq: Three times a day (TID) | ORAL | 1 refills | Status: DC

## 2023-04-11 MED ORDER — GABAPENTIN 300 MG PO CAPS: 300.0000 mg | ORAL_CAPSULE | Freq: Three times a day (TID) | ORAL | 1 refills | Status: DC

## 2023-04-11 MED ORDER — METHYLPREDNISOLONE ACETATE 40 MG/ML IJ SUSP
40.0000 mg | INTRAMUSCULAR | Status: AC | PRN
  Administered 2023-04-11: 40 mg via INTRA_ARTICULAR

## 2023-04-11 MED ORDER — BUPIVACAINE HCL 0.25 % IJ SOLN
4.0000 mL | INTRAMUSCULAR | Status: AC | PRN
  Administered 2023-04-11: 4 mL via INTRA_ARTICULAR

## 2023-04-11 MED ORDER — LIDOCAINE HCL 1 % IJ SOLN
5.0000 mL | INTRAMUSCULAR | Status: AC | PRN
  Administered 2023-04-11: 5 mL

## 2023-04-11 NOTE — Progress Notes (Signed)
Office Visit Note   Patient: Katie Benson           Date of Birth: June 09, 1980           MRN: [id number] Visit Date: 04/11/2023 Requested by: No referring provider defined for this encounter. PCP: Default, Provider, MD  Subjective: Chief Complaint  Patient presents with   Left Knee - Pain    HPI: Katie Benson is a 43 y.o. female who presents to the office reporting knee pain.  Has history of knee arthritis.  No new falls or injuries.  No fevers or chills.  Previous injections have provided good relief and they are here today to repeat injection.  She has left knee injection into her left knee on 10/25/2022 that did provide good relief.  She describes burning pain primarily in the lateral aspect of the knee without radiation and this has improved in the past with intra-articular knee injections.  She also complains of left fifth toe pain and wants to see Dr. Lajoyce Corners for this problem..                ROS: All systems reviewed are negative as they relate to the chief complaint within the history of present illness.  Patient denies fevers or chills.  Assessment & Plan: Visit Diagnoses:  1. Unilateral primary osteoarthritis, left knee   2. Pain of foot, unspecified laterality   3. Does not have primary care provider     Plan: Patient is a 43 year old female who presents for evaluation of left knee pain.  Has history of left knee arthritis primarily in the lateral compartment.  Last injection provided good relief for her and she would like to repeat this today.  Also request refills on gabapentin and Flexeril which were provided.  She would like to see Dr. Lajoyce Corners for her left fifth toe pain and referral placed.  She also would like a PCP as she does not currently have a primary provider so referral placed for this as well.  Follow-up as needed with next possible injection into the knee in 3 to 4 months.  Follow-Up Instructions: No follow-ups on file.   Orders:  Orders Placed  This Encounter  Procedures   Ambulatory referral to Orthopedic Surgery   Ambulatory referral to [facility]   Meds ordered this encounter  Medications   gabapentin (NEURONTIN) 300 MG capsule    Sig: Take 1 capsule (300 mg total) by mouth 3 (three) times daily. Do not take and operate motor vehicle    Dispense:  90 capsule    Refill:  1   cyclobenzaprine (FLEXERIL) 10 MG tablet    Sig: Take 1 tablet (10 mg total) by mouth 3 (three) times daily. Do not take and operate motor vehicle    Dispense:  90 tablet    Refill:  1      Procedures: Large Joint Inj: L knee on 04/11/2023 4:41 PM Indications: diagnostic evaluation, joint swelling and pain Details: 18 G 1.5 in needle, superolateral approach  Arthrogram: No  Medications: 5 mL lidocaine 1 %; 40 mg methylPREDNISolone acetate 40 MG/ML; 4 mL bupivacaine 0.25 % Aspirate: 5 mL Outcome: tolerated well, no immediate complications Procedure, treatment alternatives, risks and benefits explained, specific risks discussed. Consent was given by the patient. Immediately prior to procedure a time out was called to verify the correct patient, procedure, equipment, support staff and site/side marked as required. Patient was prepped and draped in the usual sterile fashion.  Clinical Data: No additional findings.  Objective: Vital Signs: There were no vitals taken for this visit.  Physical Exam:  Constitutional: Patient appears well-developed HEENT:  Head: Normocephalic Eyes:EOM are normal Neck: Normal range of motion Cardiovascular: Normal rate Pulmonary/chest: Effort normal Neurologic: Patient is alert Skin: Skin is warm Psychiatric: Patient has normal mood and affect  Ortho Exam: Ortho exam demonstrates knees without cellulitis or skin changes.  Effusion present mild to moderately.  No calf tenderness.  Negative Homans' sign.  No pain with hip range of motion.  Able to perform straight leg raise with both lower extremities.   Lower extremities warm and well-perfused.  Tender over the lateral joint line moderately and medial joint line mildly.  No cellulitis or skin changes noted.  Has good extension of 0 degrees and knee flexes easily past 90 degrees.  Specialty Comments:  MRI CERVICAL SPINE WITHOUT CONTRAST   TECHNIQUE: Multiplanar, multisequence MR imaging of the cervical spine was performed. No intravenous contrast was administered.   COMPARISON:  CT scan 12/26/2012   FINDINGS: Alignment: No vertebral subluxation is observed.   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 Disc desiccation at all levels between C2 and C7.   Cord: No significant abnormal spinal cord signal is observed.   Posterior Fossa, vertebral arteries, paraspinal tissues: Unremarkable   Disc levels:   C2-3: Unremarkable.   C3-4: Borderline bilateral foraminal stenosis due to disc bulge and uncinate spurring.   C4-5: Moderate to prominent bilateral foraminal stenosis and borderline central narrowing of the thecal sac due to disc bulge and uncinate spurring.   C5-6: Moderate right and moderate to prominent left foraminal stenosis and borderline central narrowing of the thecal sac due to disc bulge and uncinate spurring.   C6-7: Mild right and moderate left foraminal stenosis due to disc bulge and uncinate spurring.   C7-T1: No impingement.  Minimal uncinate spurring.   T1-2: Unremarkable.   IMPRESSION: 1. Cervical spondylosis, degenerative disc disease, and mildly congenitally short pedicles contribute to moderate to prominent impingement at C4-5; moderate impingement at C5-6; and mild impingement at C6-7, as detailed above.     Electronically Signed   By: Gaylyn Rong M.D.   On: 08/01/2020 09:25  ---------------------------------------------- EXAM: MRI LUMBAR SPINE WITHOUT CONTRAST   TECHNIQUE: Multiplanar, multisequence MR imaging of the lumbar spine was performed. No intravenous  contrast was administered.   COMPARISON:  Lumbar spine x-rays dated June 23, 2021. CT abdomen pelvis dated March 26, 2020.   FINDINGS: Segmentation:  Standard.   Alignment:  Physiologic.   Vertebrae:  No fracture, evidence of discitis, or bone lesion.   Conus medullaris and cauda equina: Conus extends to the L1-L2 level. Conus and cauda equina appear normal.   Paraspinal and other soft tissues: Negative.   Disc levels:   T11-T12 to L2-L3: Negative.   L3-L4: Mild disc bulging and bilateral facet arthropathy. Mild bilateral neuroforaminal stenosis. No spinal canal stenosis.   L4-L5: Negative disc. Mild bilateral facet arthropathy. Mild bilateral neuroforaminal stenosis. No spinal canal stenosis.   L5-S1: Negative disc. Mild bilateral facet arthropathy. No stenosis.   IMPRESSION: 1. Mild degenerative changes of the lower lumbar spine as described above. No high-grade stenosis or impingement.     Electronically Signed   By: Obie Dredge M.D.   On: 08/19/2021 16:36  Imaging: No results found.   PMFS History: Patient Active Problem List   Diagnosis Date Noted   History of esophageal ulcer 12/22/2021   Ingrown nail of  fifth toe 12/22/2021   Chronic toe pain, left foot 12/22/2021   Blurry vision, bilateral 12/22/2021   Left corneal abrasion 12/22/2021   Osteoarthritis of left knee 12/14/2020   Subacromial bursitis of right shoulder joint 07/15/2020   Cervical radiculopathy 06/24/2020   Effusion, left knee 06/24/2019   Pes anserine bursitis 04/29/2019   Bucket handle tear of lateral meniscus of left knee 12/12/2018   Ganglion cyst 12/12/2018   Fibroids 03/18/2018   S/P parathyroidectomy 02/12/2018   Primary hyperparathyroidism (HCC) 01/08/2018   Hyperparathyroidism , secondary, non-renal (HCC) 01/08/2018   Fibroid uterus 12/06/2017   Hypercalcemia 12/06/2017   Fibroid 10/15/2017   Abnormal uterine bleeding (AUB) 08/20/2017   Anemia 08/20/2017   Past  Medical History:  Diagnosis Date   Anemia    Arthritis    Fibroids    GERD (gastroesophageal reflux disease)    History of kidney stones    Neck injury     Family History  Problem Relation Age of Onset   Diabetes Sister    Cancer Maternal Grandmother        ovarian   Hypercalcemia Mother    Other Father        HX Unknown    Past Surgical History:  Procedure Laterality Date   CESAREAN SECTION     IR ANGIOGRAM PELVIS SELECTIVE OR SUPRASELECTIVE  03/18/2018   IR ANGIOGRAM PELVIS SELECTIVE OR SUPRASELECTIVE  03/18/2018   IR ANGIOGRAM SELECTIVE EACH ADDITIONAL VESSEL  03/18/2018   IR ANGIOGRAM SELECTIVE EACH ADDITIONAL VESSEL  03/18/2018   IR EMBO TUMOR ORGAN ISCHEMIA INFARCT INC GUIDE ROADMAPPING  03/18/2018   IR RADIOLOGIST EVAL & MGMT  10/17/2017   IR RADIOLOGIST EVAL & MGMT  12/20/2017   IR RADIOLOGIST EVAL & MGMT  04/17/2018   IR US GUIDE VASC ACCESS RIGHT  03/18/2018   KNEE ARTHROSCOPY WITH LATERAL MENISECTOMY Left 12/24/2018   Procedure: LEFT KNEE ARTHROSCOPY WITH LATERAL MENISECTOM, debriment of GANGLION CYST;  Surgeon: Valeria Batman, MD;  Location: [facility];  Service: Orthopedics;  Laterality: Left;   PARATHYROIDECTOMY N/A 02/12/2018   Procedure: PARATHYROIDECTOMY;  Surgeon: Duanne Guess, MD;  Location: [facility];  Service: General;  Laterality: N/A;   WISDOM TOOTH EXTRACTION     age 59   Social History   Occupational History   Occupation: unemployed  Tobacco Use   Smoking status: Some Days    Current packs/day: 0.25    Average packs/day: 0.3 packs/day for 14.0 years (3.5 ttl pk-yrs)    Types: Cigarettes   Smokeless tobacco: Never  Vaping Use   Vaping status: Never Used  Substance and Sexual Activity   Alcohol use: Yes    Comment: occassionally on weekends   Drug use: Yes    Types: Marijuana    Comment: multiple times a day   Sexual activity: Yes    Partners: Male    Birth control/protection: Condom

## 2023-04-19 ENCOUNTER — Other Ambulatory Visit: Payer: Self-pay

## 2023-04-19 MED ORDER — GABAPENTIN 300 MG PO CAPS
300.0000 mg | ORAL_CAPSULE | Freq: Three times a day (TID) | ORAL | 1 refills | Status: DC
  Filled 2023-05-10: qty 90, 30d supply, fill #0

## 2023-04-19 MED ORDER — CYCLOBENZAPRINE HCL 10 MG PO TABS
10.0000 mg | ORAL_TABLET | Freq: Three times a day (TID) | ORAL | 1 refills | Status: DC
  Filled 2023-06-06 – 2023-06-08 (×2): qty 90, 30d supply, fill #0
  Filled 2023-07-09: qty 90, 30d supply, fill #1

## 2023-04-19 MED ORDER — CYCLOBENZAPRINE HCL 10 MG PO TABS
10.0000 mg | ORAL_TABLET | Freq: Three times a day (TID) | ORAL | 1 refills | Status: DC
  Filled 2023-04-19 – 2023-05-10 (×2): qty 90, 30d supply, fill #0
  Filled 2023-08-05: qty 90, 30d supply, fill #1

## 2023-04-24 ENCOUNTER — Encounter: Payer: Medicaid Other | Admitting: Orthopedic Surgery

## 2023-04-26 ENCOUNTER — Ambulatory Visit: Payer: Medicaid Other | Admitting: Orthopedic Surgery

## 2023-04-26 DIAGNOSIS — L03032 Cellulitis of left toe: Secondary | ICD-10-CM | POA: Diagnosis not present

## 2023-04-30 ENCOUNTER — Other Ambulatory Visit: Payer: Self-pay

## 2023-05-03 ENCOUNTER — Ambulatory Visit: Payer: Medicaid Other | Admitting: Orthopedic Surgery

## 2023-05-03 ENCOUNTER — Encounter: Payer: Self-pay | Admitting: Orthopedic Surgery

## 2023-05-03 NOTE — Progress Notes (Signed)
 Office Visit Note   Patient: Katie Benson           Date of Birth: 12/16/1980           MRN: [id number] Visit Date: 04/26/2023              Requested by: No referring provider defined for this encounter. PCP: Default, Provider, MD  Chief Complaint  Patient presents with   Left Foot - Nail Problem      HPI: Patient is a 43 year old woman who was seen in follow-up status post nail excision on 11/22/2022.  Patient had a paronychial infection of the left little toe.  Assessment & Plan: Visit Diagnoses:  1. Paronychia of fifth toe, right     Plan: The callus was pared no signs of any deep infection.  Recommended wider shoe wear.  Follow-Up Instructions: No follow-ups on file.   Ortho Exam  Patient is alert, oriented, no adenopathy, well-dressed, normal affect, normal respiratory effort. Examination there is no evidence of cellulitis no sausage digit swelling no tenderness to palpation.  The left fifth toes shows no cellulitis no open wounds.  There is hypertrophic callus that was pared without complications no signs of deep abscess.  No signs of a plantars wart.  Imaging: No results found. No images are attached to the encounter.  Labs: Lab Results  Component Value Date   REPTSTATUS 01/15/2013 FINAL 01/14/2013   CULT  01/14/2013    Multiple bacterial morphotypes present, none predominant. Suggest appropriate recollection if clinically indicated. Performed at [facility]     Lab Results  Component Value Date   ALBUMIN 4.4 07/07/2021   ALBUMIN 4.0 03/18/2020   ALBUMIN 4.1 03/18/2018    Lab Results  Component Value Date   MG 2.1 12/07/2017   Lab Results  Component Value Date   VD25OH 7.6 (L) 12/07/2017    No results found for: "PREALBUMIN"    Latest Ref Rng & Units 07/07/2021    2:25 PM 03/18/2020   12:08 PM 12/19/2018    9:01 AM  CBC EXTENDED  WBC 3.4 - 10.8 x10E3/uL 9.9  10.7  11.1   RBC 3.77 - 5.28 x10E6/uL 4.85  4.62  5.33    Hemoglobin 11.1 - 15.9 g/dL 72.5  36.6  44.0   HCT 34.0 - 46.6 % 38.0  35.1  40.1   Platelets 150 - 450 x10E3/uL 318  327  375   NEUT# 1.4 - 7.0 x10E3/uL 6.5     Lymph# 0.7 - 3.1 x10E3/uL 2.7        There is no height or weight on file to calculate BMI.  Orders:  No orders of the defined types were placed in this encounter.  No orders of the defined types were placed in this encounter.    Procedures: No procedures performed  Clinical Data: No additional findings.  ROS:  All other systems negative, except as noted in the HPI. Review of Systems  Objective: Vital Signs: There were no vitals taken for this visit.  Specialty Comments:  MRI CERVICAL SPINE WITHOUT CONTRAST   TECHNIQUE: Multiplanar, multisequence MR imaging of the cervical spine was performed. No intravenous contrast was administered.   COMPARISON:  CT scan 12/26/2012   FINDINGS: Alignment: No vertebral subluxation is observed.   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 Disc desiccation at all levels between C2 and C7.   Cord: No significant abnormal spinal cord signal is observed.   Posterior Fossa, vertebral arteries, paraspinal  tissues: Unremarkable   Disc levels:   C2-3: Unremarkable.   C3-4: Borderline bilateral foraminal stenosis due to disc bulge and uncinate spurring.   C4-5: Moderate to prominent bilateral foraminal stenosis and borderline central narrowing of the thecal sac due to disc bulge and uncinate spurring.   C5-6: Moderate right and moderate to prominent left foraminal stenosis and borderline central narrowing of the thecal sac due to disc bulge and uncinate spurring.   C6-7: Mild right and moderate left foraminal stenosis due to disc bulge and uncinate spurring.   C7-T1: No impingement.  Minimal uncinate spurring.   T1-2: Unremarkable.   IMPRESSION: 1. Cervical spondylosis, degenerative disc disease, and mildly congenitally short pedicles  contribute to moderate to prominent impingement at C4-5; moderate impingement at C5-6; and mild impingement at C6-7, as detailed above.     Electronically Signed   By: Gaylyn Rong M.D.   On: 08/01/2020 09:25  ---------------------------------------------- EXAM: MRI LUMBAR SPINE WITHOUT CONTRAST   TECHNIQUE: Multiplanar, multisequence MR imaging of the lumbar spine was performed. No intravenous contrast was administered.   COMPARISON:  Lumbar spine x-rays dated June 23, 2021. CT abdomen pelvis dated March 26, 2020.   FINDINGS: Segmentation:  Standard.   Alignment:  Physiologic.   Vertebrae:  No fracture, evidence of discitis, or bone lesion.   Conus medullaris and cauda equina: Conus extends to the L1-L2 level. Conus and cauda equina appear normal.   Paraspinal and other soft tissues: Negative.   Disc levels:   T11-T12 to L2-L3: Negative.   L3-L4: Mild disc bulging and bilateral facet arthropathy. Mild bilateral neuroforaminal stenosis. No spinal canal stenosis.   L4-L5: Negative disc. Mild bilateral facet arthropathy. Mild bilateral neuroforaminal stenosis. No spinal canal stenosis.   L5-S1: Negative disc. Mild bilateral facet arthropathy. No stenosis.   IMPRESSION: 1. Mild degenerative changes of the lower lumbar spine as described above. No high-grade stenosis or impingement.     Electronically Signed   By: Obie Dredge M.D.   On: 08/19/2021 16:36  PMFS History: Patient Active Problem List   Diagnosis Date Noted   History of esophageal ulcer 12/22/2021   Ingrown nail of fifth toe 12/22/2021   Chronic toe pain, left foot 12/22/2021   Blurry vision, bilateral 12/22/2021   Left corneal abrasion 12/22/2021   Osteoarthritis of left knee 12/14/2020   Subacromial bursitis of right shoulder joint 07/15/2020   Cervical radiculopathy 06/24/2020   Effusion, left knee 06/24/2019   Pes anserine bursitis 04/29/2019   Bucket handle tear of lateral  meniscus of left knee 12/12/2018   Ganglion cyst 12/12/2018   Fibroids 03/18/2018   S/P parathyroidectomy 02/12/2018   Primary hyperparathyroidism (HCC) 01/08/2018   Hyperparathyroidism , secondary, non-renal (HCC) 01/08/2018   Fibroid uterus 12/06/2017   Hypercalcemia 12/06/2017   Fibroid 10/15/2017   Abnormal uterine bleeding (AUB) 08/20/2017   Anemia 08/20/2017   Past Medical History:  Diagnosis Date   Anemia    Arthritis    Fibroids    GERD (gastroesophageal reflux disease)    History of kidney stones    Neck injury     Family History  Problem Relation Age of Onset   Diabetes Sister    Cancer Maternal Grandmother        ovarian   Hypercalcemia Mother    Other Father        HX Unknown    Past Surgical History:  Procedure Laterality Date   CESAREAN SECTION     IR ANGIOGRAM PELVIS SELECTIVE OR  SUPRASELECTIVE  03/18/2018   IR ANGIOGRAM PELVIS SELECTIVE OR SUPRASELECTIVE  03/18/2018   IR ANGIOGRAM SELECTIVE EACH ADDITIONAL VESSEL  03/18/2018   IR ANGIOGRAM SELECTIVE EACH ADDITIONAL VESSEL  03/18/2018   IR EMBO TUMOR ORGAN ISCHEMIA INFARCT INC GUIDE ROADMAPPING  03/18/2018   IR RADIOLOGIST EVAL & MGMT  10/17/2017   IR RADIOLOGIST EVAL & MGMT  12/20/2017   IR RADIOLOGIST EVAL & MGMT  04/17/2018   IR US GUIDE VASC ACCESS RIGHT  03/18/2018   KNEE ARTHROSCOPY WITH LATERAL MENISECTOMY Left 12/24/2018   Procedure: LEFT KNEE ARTHROSCOPY WITH LATERAL MENISECTOM, debriment of GANGLION CYST;  Surgeon: Valeria Batman, MD;  Location: [facility];  Service: Orthopedics;  Laterality: Left;   PARATHYROIDECTOMY N/A 02/12/2018   Procedure: PARATHYROIDECTOMY;  Surgeon: Duanne Guess, MD;  Location: [facility];  Service: General;  Laterality: N/A;   WISDOM TOOTH EXTRACTION     age 66   Social History   Occupational History   Occupation: unemployed  Tobacco Use   Smoking status: Some Days    Current packs/day: 0.25    Average packs/day: 0.3 packs/day for 14.0 years (3.5 ttl pk-yrs)     Types: Cigarettes   Smokeless tobacco: Never  Vaping Use   Vaping status: Never Used  Substance and Sexual Activity   Alcohol use: Yes    Comment: occassionally on weekends   Drug use: Yes    Types: Marijuana    Comment: multiple times a day   Sexual activity: Yes    Partners: Male    Birth control/protection: Condom

## 2023-05-09 ENCOUNTER — Other Ambulatory Visit: Payer: Self-pay

## 2023-05-10 ENCOUNTER — Other Ambulatory Visit: Payer: Self-pay

## 2023-05-29 ENCOUNTER — Other Ambulatory Visit: Payer: Self-pay | Admitting: Surgical

## 2023-05-30 ENCOUNTER — Other Ambulatory Visit: Payer: Self-pay

## 2023-05-30 MED ORDER — GABAPENTIN 300 MG PO CAPS
300.0000 mg | ORAL_CAPSULE | Freq: Three times a day (TID) | ORAL | 1 refills | Status: DC
  Filled 2023-05-30 – 2023-06-08 (×3): qty 90, 30d supply, fill #0
  Filled 2023-07-09: qty 90, 30d supply, fill #1

## 2023-06-06 ENCOUNTER — Other Ambulatory Visit: Payer: Self-pay

## 2023-06-07 ENCOUNTER — Other Ambulatory Visit: Payer: Self-pay

## 2023-06-08 ENCOUNTER — Other Ambulatory Visit: Payer: Self-pay

## 2023-06-13 ENCOUNTER — Other Ambulatory Visit: Payer: Self-pay

## 2023-06-18 ENCOUNTER — Encounter: Admitting: Sports Medicine

## 2023-06-28 ENCOUNTER — Other Ambulatory Visit: Payer: Self-pay

## 2023-06-28 ENCOUNTER — Encounter: Payer: Self-pay | Admitting: Sports Medicine

## 2023-06-28 ENCOUNTER — Ambulatory Visit: Admitting: Sports Medicine

## 2023-06-28 DIAGNOSIS — M542 Cervicalgia: Secondary | ICD-10-CM

## 2023-06-28 DIAGNOSIS — M4722 Other spondylosis with radiculopathy, cervical region: Secondary | ICD-10-CM

## 2023-06-28 DIAGNOSIS — K581 Irritable bowel syndrome with constipation: Secondary | ICD-10-CM

## 2023-06-28 DIAGNOSIS — K2100 Gastro-esophageal reflux disease with esophagitis, without bleeding: Secondary | ICD-10-CM

## 2023-06-28 DIAGNOSIS — M62838 Other muscle spasm: Secondary | ICD-10-CM

## 2023-06-28 DIAGNOSIS — F17200 Nicotine dependence, unspecified, uncomplicated: Secondary | ICD-10-CM

## 2023-06-28 DIAGNOSIS — M5412 Radiculopathy, cervical region: Secondary | ICD-10-CM | POA: Diagnosis not present

## 2023-06-28 MED ORDER — METHYLPREDNISOLONE 4 MG PO TBPK: ORAL_TABLET | ORAL | 0 refills | Status: DC

## 2023-06-28 MED ORDER — METHYLPREDNISOLONE 4 MG PO TBPK
ORAL_TABLET | ORAL | 0 refills | Status: AC
  Filled 2023-06-28: qty 21, 6d supply, fill #0

## 2023-06-28 NOTE — Progress Notes (Signed)
 Patient says that the symptoms in her right arm and hand have gotten worse. She says that initially she had numbness in the finger tips, but this is now in her full hand and going up the arm to the shoulder. 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 Patient says that this numbness and pain is making it difficult daily as well as disrupting her sleep.

## 2023-06-28 NOTE — Progress Notes (Signed)
 Katie Benson - 43 y.o. female MRN [id number]  Date of birth: 09-21-1980  Office Visit Note: Visit Date: 06/28/2023 PCP: Default, Provider, MD Referred by: No ref. provider found  Subjective: Chief Complaint  Patient presents with   Neck - Pain   HPI: Katie Benson is a pleasant 43 y.o. female who presents today for neck pain and R-cervical radiculopathy.  She has had chronic neck and right shoulder pain with radicular symptoms into the right arm and hand.  Pain radiates into the forearm as well as the 2nd through 4th finger, sometimes the thumb.  She now is also experiencing some weakness or difficulty with gripping on the right hand.  This has been bothersome for her since last year but her pain and symptoms continue to get worse.  This is interfering with daily activities, sleep. Initially she was not interested in any surgical management, although now given the degree of pain she is experiencing she is wanting to move forward with this.  She did have an MRI scheduled of the cervical spine per Dr. Frieda Jew request, but was unable to have this completed.  She uses gabapentin  300 mg 3 times daily as well as Flexeril  10mg  at bedtime with only mild relief of her symptoms.  Had previous cervical ESI injection with Dr. Daisey Dryer which in the past felt like they helped about 70-80%.  She somewhat recently underwent right C7-T1 interlaminar injection which gave her again about 80% relief or more.  She states both of these have been quite helpful for her pain and specifically her burning and tingling pain into the hands and fingers.  When she met with Dr. Sulema Endo previously, he discussed he would like her to be tobacco free prior to any surgery.  She has been reducing her cigarette use, smokes anywhere between 1 to 3 cigarettes on a good day and 5-6 cigarettes on a bad day.  Pertinent ROS were reviewed with the patient and found to be negative unless otherwise specified above in HPI.    Assessment & Plan: Visit Diagnoses:  1. Cervical radiculopathy   2. Cervicalgia   3. Other spondylosis with radiculopathy, cervical region   4. Tobacco use disorder   5. Trapezius muscle spasm    Plan: Impression is chronic but progressive cervical radiculopathy with numbness and tingling extending down the right arm all the way into the fingers.  This has persisted and even worsened, as now she is having some weakness with gripping in the right hand.  She has failed conservative management including physical therapy, gabapentin , cervical steroid injections.  She did receive about 75-80% relief of her symptoms however with 2 prior cervical ESI's.  At this point, she is wishing to proceed with possible cervical spinal surgery to improve her symptoms.  She has seen Dr. Sulema Endo in our office, I would like her to follow-up with him to discuss surgical options moving forward.  We need to obtain a new cervical MRI to correlate with her symptoms and her previous nerve conduction study/EMG, as her last MRI was from 2022.  This was ordered for her today.  We discussed the importance of being nicotine /tobacco free prior to any elective surgery, she is working on reducing and cutting back.  She states if she has a surgical date set, she will be able to quit beforehand.  For her current symptoms, we will place her on a 6-day Medrol  Dosepak.  She will have her cervical MRI and follow-up with Dr. Sulema Endo 1 week  after this to review and discuss next steps that may include surgical intervention. _______________________________________________________  The patient currently smokes about 2-6 cigarettes/day. We did discuss the patient's tobacco use and the importance of tobacco cessation or cutting back on the number of cigarettes as this adversely affects her overall health but also increases risk of infection, delayed wound healing, and surgical AE's if a spine procedure were to be performed. Smoking cessation techniques  were discussed. The patient has agreed to work on this. Approximately 4 minutes were spent on discussion on tobacco use and cessation in the office today.   Follow-up: Return for F/u with Dr. Sulema Endo after completion of cervical (neck) MRI for cervical radiculopathy.   Meds & Orders:  Meds ordered this encounter  Medications   DISCONTD: methylPREDNISolone  (MEDROL  DOSEPAK) 4 MG TBPK tablet    Sig: Take per packet instruction. Taper dosing.    Dispense:  1 each    Refill:  0   methylPREDNISolone  (MEDROL  DOSEPAK) 4 MG TBPK tablet    Sig: Take 6 tablets (24 mg total) by mouth daily for 1 day, THEN 5 tablets (20 mg total) daily for 1 day, THEN 4 tablets (16 mg total) daily for 1 day, THEN 3 tablets (12 mg total) daily for 1 day, THEN 2 tablets (8 mg total) daily for 1 day, THEN 1 tablet (4 mg total) daily for 1 day.    Dispense:  21 tablet    Refill:  0    Orders Placed This Encounter  Procedures   MR Cervical Spine w/o contrast     Procedures: No procedures performed      Clinical History:   She reports that she has been smoking cigarettes. She has a 3.5 pack-year smoking history. She has never used smokeless tobacco. No results for input(s): "HGBA1C", "LABURIC" in the last 8760 hours.  Objective:   Physical Exam  Gen: Well-appearing, in no acute distress; non-toxic CV: Well-perfused. Warm.  Resp: Breathing unlabored on room air; no wheezing. Psych: Fluid speech in conversation; appropriate affect; normal thought process  Ortho Exam - Cervical/Shoulder: There is no midline spinous process tenderness, there is pain and restriction with cervical extension.  Notable right-sided cervical paraspinal hypertonicity. Positive Spurling's test on the right.  There is right-sided trapezius hypertonicity and associated trigger point.  There is 2+ DTR on the right and left at the biceps and triceps location.   Imaging:  MRI CERVICAL SPINE WITHOUT CONTRAST   TECHNIQUE: Multiplanar,  multisequence MR imaging of the cervical spine was performed. No intravenous contrast was administered.   COMPARISON:  CT scan 12/26/2012   FINDINGS: Alignment: No vertebral subluxation is observed.   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 Disc desiccation at all levels between C2 and C7.   Cord: No significant abnormal spinal cord signal is observed.   Posterior Fossa, vertebral arteries, paraspinal tissues: Unremarkable   Disc levels:   C2-3: Unremarkable.   C3-4: Borderline bilateral foraminal stenosis due to disc bulge and uncinate spurring.   C4-5: Moderate to prominent bilateral foraminal stenosis and borderline central narrowing of the thecal sac due to disc bulge and uncinate spurring.   C5-6: Moderate right and moderate to prominent left foraminal stenosis and borderline central narrowing of the thecal sac due to disc bulge and uncinate spurring.   C6-7: Mild right and moderate left foraminal stenosis due to disc bulge and uncinate spurring.   C7-T1: No impingement.  Minimal uncinate spurring.   T1-2: Unremarkable.   IMPRESSION: 1.  Cervical spondylosis, degenerative disc disease, and mildly congenitally short pedicles contribute to moderate to prominent impingement at C4-5; moderate impingement at C5-6; and mild impingement at C6-7, as detailed above.     Electronically Signed   By: Freida Jes M.D.   On: 08/01/2020 09:25  ---------------------------------------------- EXAM: MRI LUMBAR SPINE WITHOUT CONTRAST   TECHNIQUE: Multiplanar, multisequence MR imaging of the lumbar spine was performed. No intravenous contrast was administered.   COMPARISON:  Lumbar spine x-rays dated June 23, 2021. CT abdomen pelvis dated March 26, 2020.   FINDINGS: Segmentation:  Standard.   Alignment:  Physiologic.   Vertebrae:  No fracture, evidence of discitis, or bone lesion.   Conus medullaris and cauda equina: Conus extends to  the L1-L2 level. Conus and cauda equina appear normal.   Paraspinal and other soft tissues: Negative.   Disc levels:   T11-T12 to L2-L3: Negative.   L3-L4: Mild disc bulging and bilateral facet arthropathy. Mild bilateral neuroforaminal stenosis. No spinal canal stenosis.   L4-L5: Negative disc. Mild bilateral facet arthropathy. Mild bilateral neuroforaminal stenosis. No spinal canal stenosis.   L5-S1: Negative disc. Mild bilateral facet arthropathy. No stenosis.   IMPRESSION: 1. Mild degenerative changes of the lower lumbar spine as described above. No high-grade stenosis or impingement.     Electronically Signed   By: Aleta Anda M.D.   On: 08/19/2021 16:36  Past Medical/Family/Surgical/Social History: Medications & Allergies reviewed per EMR, new medications updated. Patient Active Problem List   Diagnosis Date Noted   History of esophageal ulcer 12/22/2021   Ingrown nail of fifth toe 12/22/2021   Chronic toe pain, left foot 12/22/2021   Blurry vision, bilateral 12/22/2021   Left corneal abrasion 12/22/2021   Osteoarthritis of left knee 12/14/2020   Subacromial bursitis of right shoulder joint 07/15/2020   Cervical radiculopathy 06/24/2020   Effusion, left knee 06/24/2019   Pes anserine bursitis 04/29/2019   Bucket handle tear of lateral meniscus of left knee 12/12/2018   Ganglion cyst 12/12/2018   Fibroids 03/18/2018   S/P parathyroidectomy 02/12/2018   Primary hyperparathyroidism (HCC) 01/08/2018   Hyperparathyroidism , secondary, non-renal (HCC) 01/08/2018   Fibroid uterus 12/06/2017   Hypercalcemia 12/06/2017   Fibroid 10/15/2017   Abnormal uterine bleeding (AUB) 08/20/2017   Anemia 08/20/2017   Past Medical History:  Diagnosis Date   Anemia    Arthritis    Fibroids    GERD (gastroesophageal reflux disease)    History of kidney stones    Neck injury    Family History  Problem Relation Age of Onset   Diabetes Sister    Cancer Maternal  Grandmother        ovarian   Hypercalcemia Mother    Other Father        HX Unknown   Past Surgical History:  Procedure Laterality Date   CESAREAN SECTION     IR ANGIOGRAM PELVIS SELECTIVE OR SUPRASELECTIVE  03/18/2018   IR ANGIOGRAM PELVIS SELECTIVE OR SUPRASELECTIVE  03/18/2018   IR ANGIOGRAM SELECTIVE EACH ADDITIONAL VESSEL  03/18/2018   IR ANGIOGRAM SELECTIVE EACH ADDITIONAL VESSEL  03/18/2018   IR EMBO TUMOR ORGAN ISCHEMIA INFARCT INC GUIDE ROADMAPPING  03/18/2018   IR RADIOLOGIST EVAL & MGMT  10/17/2017   IR RADIOLOGIST EVAL & MGMT  12/20/2017   IR RADIOLOGIST EVAL & MGMT  04/17/2018   IR US  GUIDE VASC ACCESS RIGHT  03/18/2018   KNEE ARTHROSCOPY WITH LATERAL MENISECTOMY Left 12/24/2018   Procedure: LEFT KNEE ARTHROSCOPY WITH LATERAL MENISECTOM,  debriment of GANGLION CYST;  Surgeon: Shirlee Dotter, MD;  Location: [facility];  Service: Orthopedics;  Laterality: Left;   PARATHYROIDECTOMY N/A 02/12/2018   Procedure: PARATHYROIDECTOMY;  Surgeon: Mercy Stall, MD;  Location: [facility];  Service: General;  Laterality: N/A;   WISDOM TOOTH EXTRACTION     age 47   Social History   Occupational History   Occupation: unemployed  Tobacco Use   Smoking status: Some Days    Current packs/day: 0.25    Average packs/day: 0.3 packs/day for 14.0 years (3.5 ttl pk-yrs)    Types: Cigarettes   Smokeless tobacco: Never  Vaping Use   Vaping status: Never Used  Substance and Sexual Activity   Alcohol use: Yes    Comment: occassionally on weekends   Drug use: Yes    Types: Marijuana    Comment: multiple times a day   Sexual activity: Yes    Partners: Male    Birth control/protection: Condom

## 2023-07-09 ENCOUNTER — Ambulatory Visit
Admission: RE | Admit: 2023-07-09 | Discharge: 2023-07-09 | Disposition: A | Discharge status: Home / Self Care | Source: Ambulatory Visit | Attending: Sports Medicine | Admitting: Sports Medicine

## 2023-07-09 ENCOUNTER — Other Ambulatory Visit

## 2023-07-09 ENCOUNTER — Other Ambulatory Visit: Payer: Self-pay

## 2023-07-09 DIAGNOSIS — M4802 Spinal stenosis, cervical region: Secondary | ICD-10-CM | POA: Diagnosis not present

## 2023-07-09 DIAGNOSIS — M5412 Radiculopathy, cervical region: Secondary | ICD-10-CM

## 2023-07-09 DIAGNOSIS — M4722 Other spondylosis with radiculopathy, cervical region: Secondary | ICD-10-CM | POA: Diagnosis not present

## 2023-07-10 ENCOUNTER — Other Ambulatory Visit: Payer: Self-pay

## 2023-07-26 ENCOUNTER — Ambulatory Visit: Admitting: Surgical

## 2023-07-30 ENCOUNTER — Ambulatory Visit: Admitting: Surgical

## 2023-08-05 ENCOUNTER — Other Ambulatory Visit: Payer: Self-pay | Admitting: Surgical

## 2023-08-06 ENCOUNTER — Other Ambulatory Visit: Payer: Self-pay

## 2023-08-06 MED ORDER — GABAPENTIN 300 MG PO CAPS
300.0000 mg | ORAL_CAPSULE | Freq: Three times a day (TID) | ORAL | 1 refills | Status: DC
  Filled 2023-08-06: qty 90, 30d supply, fill #0
  Filled 2023-09-09: qty 90, 30d supply, fill #1

## 2023-08-10 ENCOUNTER — Ambulatory Visit: Payer: Self-pay | Admitting: Sports Medicine

## 2023-08-13 ENCOUNTER — Ambulatory Visit: Admitting: Surgical

## 2023-08-13 DIAGNOSIS — M5412 Radiculopathy, cervical region: Secondary | ICD-10-CM | POA: Diagnosis not present

## 2023-08-13 DIAGNOSIS — D171 Benign lipomatous neoplasm of skin and subcutaneous tissue of trunk: Secondary | ICD-10-CM

## 2023-08-13 DIAGNOSIS — M1712 Unilateral primary osteoarthritis, left knee: Secondary | ICD-10-CM | POA: Diagnosis not present

## 2023-08-14 ENCOUNTER — Other Ambulatory Visit: Payer: Self-pay

## 2023-08-19 MED ORDER — LIDOCAINE HCL 1 % IJ SOLN
5.0000 mL | INTRAMUSCULAR | Status: AC | PRN
  Administered 2023-08-13: 5 mL

## 2023-08-19 MED ORDER — TRIAMCINOLONE ACETONIDE 40 MG/ML IJ SUSP
40.0000 mg | INTRAMUSCULAR | Status: AC | PRN
  Administered 2023-08-13: 40 mg via INTRA_ARTICULAR

## 2023-08-19 MED ORDER — BUPIVACAINE HCL 0.25 % IJ SOLN
4.0000 mL | INTRAMUSCULAR | Status: AC | PRN
  Administered 2023-08-13: 4 mL via INTRA_ARTICULAR

## 2023-08-19 NOTE — Progress Notes (Signed)
 Office Visit Note   Patient: Katie Benson           Date of Birth: [birth year]           MRN: [id number] Visit Date: 08/13/2023 Requested by: No referring provider defined for this encounter. PCP: Default, Provider, MD  Subjective: Chief Complaint  Patient presents with   Left Knee - Pain    HPI: Katie Benson is a 43 y.o. female who presents to the office reporting left knee pain.  Patient has history of left knee arthritis.  Last injection was on 04/11/2023.  She would like to repeat injection today.  No new injury to the left knee.  She also has some concern about swelling in her left scapula.  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  She states she does not like the way that it looks and she would like to have this removed.  She denies any nighttime pain or significant discomfort associated with this lipoma aside from mild discomfort with pressure.  She is able to sleep on her left side or back.  Bothers her when she wears tank tops or bathing suits due to the appearance.              ROS: All systems reviewed are negative as they relate to the chief complaint within the history of present illness.  Patient denies fevers or chills.  Assessment & Plan: Visit Diagnoses:  1. Cervical radiculopathy     Plan: Plan today is to refer patient to Dr. Georgina after having seen Dr. Burnetta for evaluation of cervical radiculopathy.  We will also inject her left knee again with her history of left knee arthritis.  She tolerated procedure well after aspiration of about 15 cc of nonpurulent synovial fluid.  We can repeat this injection in 3 to 4 months as needed.  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  However this current measurement is actually consistent with the first measurement on 08/05/2021.  Still not having any significant symptoms of  malignancy.  She had ultrasound evaluation in the past demonstrating lack of any concerning findings.  However, with the possible very slight increase in size and the continued symptoms, she would like to have this removed.  Will plan to order left scapular MRI with and without contrast to evaluate lipoma and follow-up with Dr. [name] to review results.  Follow-Up Instructions: No follow-ups on file.   Orders:  Orders Placed This Encounter  Procedures   Ambulatory referral to Orthopedic Surgery   No orders of the defined types were placed in this encounter.     Procedures: Large Joint Inj: L knee on 08/13/2023 4:01 PM Indications: diagnostic evaluation, joint swelling and pain Details: 18 G 1.5 in needle, superolateral approach  Arthrogram: No  Medications: 5 mL lidocaine  1 %; 4 mL bupivacaine  0.25 %; 40 mg triamcinolone acetonide 40 MG/ML Aspirate: 15 mL Outcome: tolerated well, no immediate complications Procedure, treatment alternatives, risks and benefits explained, specific risks discussed. Consent was given by the patient. Immediately prior to procedure a time out was called to verify the correct patient, procedure, equipment, support staff and site/side marked as required. Patient was prepped and draped in the usual sterile fashion.       Clinical Data: No additional findings.  Objective: Vital Signs: There were no vitals taken for this visit.  Physical Exam:  Constitutional: Patient appears well-developed HEENT:  Head: Normocephalic Eyes:EOM are normal Neck: Normal range of motion Cardiovascular: Normal rate Pulmonary/chest: Effort normal Neurologic: Patient is alert Skin: Skin is warm Psychiatric: Patient has normal mood and affect  Ortho Exam: Ortho exam demonstrates left knee with positive effusion.  No cellulitis or skin changes noted.  Tenderness over the lateral joint line.  No tenderness over the medial joint line.  Able to perform straight leg raise.   Palpable DP pulse of the left lower extremity.  No pain with hip range of motion.  Left scapula demonstrates mobile minimally tender mass that feels to be superficial and in the subcutaneous layer.  No redness or cellulitis.  The size measures 5 x 6 cm.  Specialty Comments:  MRI CERVICAL SPINE WITHOUT CONTRAST   TECHNIQUE: Multiplanar, multisequence MR imaging of the cervical spine was performed. No intravenous contrast was administered.   COMPARISON:  CT scan 12/26/2012   FINDINGS: Alignment: No vertebral subluxation is observed.   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 Disc desiccation at all levels between C2 and C7.   Cord: No significant abnormal spinal cord signal is observed.   Posterior Fossa, vertebral arteries, paraspinal tissues: Unremarkable   Disc levels:   C2-3: Unremarkable.   C3-4: Borderline bilateral foraminal stenosis due to disc bulge and uncinate spurring.   C4-5: Moderate to prominent bilateral foraminal stenosis and borderline central narrowing of the thecal sac due to disc bulge and uncinate spurring.   C5-6: Moderate right and moderate to prominent left foraminal stenosis and borderline central narrowing of the thecal sac due to disc bulge and uncinate spurring.   C6-7: Mild right and moderate left foraminal stenosis due to disc bulge and uncinate spurring.   C7-T1: No impingement.  Minimal uncinate spurring.   T1-2: Unremarkable.   IMPRESSION: 1. Cervical spondylosis, degenerative disc disease, and mildly congenitally short pedicles contribute to moderate to prominent impingement at C4-5; moderate impingement at C5-6; and mild impingement at C6-7, as detailed above.     Electronically Signed   By: Ryan Salvage M.D.   On: 08/01/2020 09:25  ---------------------------------------------- EXAM: MRI LUMBAR SPINE WITHOUT CONTRAST   TECHNIQUE: Multiplanar, multisequence MR imaging of the lumbar spine  was performed. No intravenous contrast was administered.   COMPARISON:  Lumbar spine x-rays dated June 23, 2021. CT abdomen pelvis dated March 26, 2020.   FINDINGS: Segmentation:  Standard.   Alignment:  Physiologic.   Vertebrae:  No fracture, evidence of discitis, or bone lesion.   Conus medullaris and cauda equina: Conus extends to the L1-L2 level. Conus and cauda equina appear normal.   Paraspinal and other soft tissues: Negative.   Disc levels:   T11-T12 to L2-L3: Negative.   L3-L4: Mild disc bulging and bilateral facet arthropathy. Mild bilateral neuroforaminal stenosis. No spinal canal stenosis.   L4-L5: Negative disc. Mild bilateral facet arthropathy. Mild bilateral neuroforaminal stenosis. No spinal canal stenosis.   L5-S1: Negative disc. Mild bilateral facet arthropathy. No stenosis.   IMPRESSION: 1. Mild degenerative changes of the lower lumbar spine as described above. No high-grade stenosis or impingement.     Electronically Signed   By: Elsie ONEIDA Shoulder M.D.   On: 08/19/2021 16:36  Imaging: No results found.   PMFS History: Patient Active Problem List   Diagnosis Date Noted   History of esophageal ulcer 12/22/2021   Ingrown nail of fifth toe 12/22/2021   Chronic toe pain, left foot 12/22/2021   Blurry vision,  bilateral 12/22/2021   Left corneal abrasion 12/22/2021   Osteoarthritis of left knee 12/14/2020   Subacromial bursitis of right shoulder joint 07/15/2020   Cervical radiculopathy 06/24/2020   Effusion, left knee 06/24/2019   Pes anserine bursitis 04/29/2019   Bucket handle tear of lateral meniscus of left knee 12/12/2018   Ganglion cyst 12/12/2018   Fibroids 03/18/2018   S/P parathyroidectomy 02/12/2018   Primary hyperparathyroidism (HCC) 01/08/2018   Hyperparathyroidism , secondary, non-renal (HCC) 01/08/2018   Fibroid uterus 12/06/2017   Hypercalcemia 12/06/2017   Fibroid 10/15/2017   Abnormal uterine bleeding (AUB) 08/20/2017    Anemia 08/20/2017   Past Medical History:  Diagnosis Date   Anemia    Arthritis    Fibroids    GERD (gastroesophageal reflux disease)    History of kidney stones    Neck injury     Family History  Problem Relation Age of Onset   Diabetes Sister    Cancer Maternal Grandmother        ovarian   Hypercalcemia Mother    Other Father        HX Unknown    Past Surgical History:  Procedure Laterality Date   CESAREAN SECTION     IR ANGIOGRAM PELVIS SELECTIVE OR SUPRASELECTIVE  03/18/2018   IR ANGIOGRAM PELVIS SELECTIVE OR SUPRASELECTIVE  03/18/2018   IR ANGIOGRAM SELECTIVE EACH ADDITIONAL VESSEL  03/18/2018   IR ANGIOGRAM SELECTIVE EACH ADDITIONAL VESSEL  03/18/2018   IR EMBO TUMOR ORGAN ISCHEMIA INFARCT INC GUIDE ROADMAPPING  03/18/2018   IR RADIOLOGIST EVAL & MGMT  10/17/2017   IR RADIOLOGIST EVAL & MGMT  12/20/2017   IR RADIOLOGIST EVAL & MGMT  04/17/2018   IR US  GUIDE VASC ACCESS RIGHT  03/18/2018   KNEE ARTHROSCOPY WITH LATERAL MENISECTOMY Left 12/24/2018   Procedure: LEFT KNEE ARTHROSCOPY WITH LATERAL MENISECTOM, debriment of GANGLION CYST;  Surgeon: Anderson Maude ORN, MD;  Location: [facility];  Service: Orthopedics;  Laterality: Left;   PARATHYROIDECTOMY N/A 02/12/2018   Procedure: PARATHYROIDECTOMY;  Surgeon: Marolyn Nest, MD;  Location: [facility];  Service: General;  Laterality: N/A;   WISDOM TOOTH EXTRACTION     age 4   Social History   Occupational History   Occupation: unemployed  Tobacco Use   Smoking status: Some Days    Current packs/day: 0.25    Average packs/day: 0.3 packs/day for 14.0 years (3.5 ttl pk-yrs)    Types: Cigarettes   Smokeless tobacco: Never  Vaping Use   Vaping status: Never Used  Substance and Sexual Activity   Alcohol use: Yes    Comment: occassionally on weekends   Drug use: Yes    Types: Marijuana    Comment: multiple times a day   Sexual activity: Yes    Partners: Male    Birth control/protection: Condom

## 2023-08-20 ENCOUNTER — Encounter: Payer: Self-pay | Admitting: Orthopedic Surgery

## 2023-08-20 NOTE — Addendum Note (Signed)
 Addended by: TRINDA DEANE HERO on: 08/20/2023 04:39 PM   Modules accepted: Orders

## 2023-08-21 ENCOUNTER — Other Ambulatory Visit: Payer: Self-pay

## 2023-08-21 ENCOUNTER — Encounter: Payer: Self-pay | Admitting: Surgical

## 2023-09-03 ENCOUNTER — Other Ambulatory Visit: Payer: Self-pay

## 2023-09-09 ENCOUNTER — Other Ambulatory Visit: Payer: Self-pay | Admitting: Surgical

## 2023-09-10 ENCOUNTER — Other Ambulatory Visit: Payer: Self-pay

## 2023-09-11 ENCOUNTER — Other Ambulatory Visit: Payer: Self-pay

## 2023-09-11 MED ORDER — CYCLOBENZAPRINE HCL 10 MG PO TABS
10.0000 mg | ORAL_TABLET | Freq: Three times a day (TID) | ORAL | 1 refills | Status: DC
  Filled 2023-09-11: qty 90, 30d supply, fill #0
  Filled 2023-10-12: qty 90, 30d supply, fill #1

## 2023-09-15 ENCOUNTER — Ambulatory Visit
Admission: RE | Admit: 2023-09-15 | Discharge: 2023-09-15 | Disposition: A | Discharge status: Home / Self Care | Source: Ambulatory Visit | Attending: Surgical

## 2023-09-15 DIAGNOSIS — R2232 Localized swelling, mass and lump, left upper limb: Secondary | ICD-10-CM | POA: Diagnosis not present

## 2023-09-15 DIAGNOSIS — D171 Benign lipomatous neoplasm of skin and subcutaneous tissue of trunk: Secondary | ICD-10-CM

## 2023-09-15 MED ORDER — GADOPICLENOL 0.5 MMOL/ML IV SOLN
7.0000 mL | Freq: Once | INTRAVENOUS | Status: AC | PRN
  Administered 2023-09-15: 7 mL via INTRAVENOUS

## 2023-09-19 ENCOUNTER — Ambulatory Visit: Admitting: Orthopedic Surgery

## 2023-09-19 ENCOUNTER — Other Ambulatory Visit: Payer: Self-pay

## 2023-09-19 ENCOUNTER — Other Ambulatory Visit (INDEPENDENT_AMBULATORY_CARE_PROVIDER_SITE_OTHER): Payer: Self-pay

## 2023-09-19 VITALS — BP 114/81 | HR 106 | Ht 62.5 in | Wt 148.2 lb

## 2023-09-19 DIAGNOSIS — M5412 Radiculopathy, cervical region: Secondary | ICD-10-CM | POA: Diagnosis not present

## 2023-09-19 NOTE — Progress Notes (Signed)
 Orthopedic Spine Surgery Office Note   Assessment: Patient is a 43 y.o. female with neck pain that radiates into the right upper extremity in multiple distributions     Plan: -Patient has tried PT, tylenol , gabapentin , cervical steroid injections -Since patient got about 80% relief for 6 months with her last injection, repeat injection was recommended.  Referral provided to her today -Would need to be nicotine  free prior to any elective spine surgery -Patient should return to office on an as-needed basis     Patient expressed understanding of the plan and all questions were answered to the patient's satisfaction.    ___________________________________________________________________________     History:   Patient is a 43 y.o. female who presents today for neck pain that radiates into the right upper extremity.  She says she has had pain going into her right anterior arm, radial and lateral forearm, and into all the fingers.  She notes the hand goes numb at times.  She says if it is numb for a while it will be painful.  She has no pain rating to the left upper extremity.  She has gotten injections in the past with her most recent on 01/09/2023.  She says she had about 6 months relief with that injection and then pain started to slowly return.   Treatments tried: PT, gabapentin , cervical steroid injections     Physical Exam:   General: no acute distress, appears stated age Neurologic: alert, answering questions appropriately, following commands Respiratory: unlabored breathing on room air, symmetric chest rise Psychiatric: appropriate affect, normal cadence to speech     MSK (spine):   -Strength exam                                                   Left                  Right Grip strength                5/5                  5/5 Interosseus                  5/5                  5/5 Wrist extension            5/5                  5/5 Wrist flexion                 5/5                   5/5 Elbow flexion                5/5                  5/5 Deltoid                          5/5                  5/5   -Sensory exam  Sensation intact to light touch in C5-T1 nerve distributions of bilateral upper extremities   -Brachioradialis DTR: 2/4 on the left, 2/4 on the right -Biceps DTR: 2/4 on the left, 2/4 on the right     Imaging: XRs of the cervical spine from 09/19/2023 were independently reviewed and interpreted, showing small anterior osteophyte formation at C5/6 and C6/7.  No other significant degenerative changes seen.  No evidence of instability on flexion/extension views.  No fracture or dislocation seen.  MRI of the cervical spine from 07/09/2023 was independent reviewed and interpreted, showing mild foraminal stenosis at C4/5.  No other significant central or foraminal stenosis.  No T2 cord signal change.     Patient name: Katie Benson Patient MRN: [id number] Date of visit: 09/19/23

## 2023-09-24 ENCOUNTER — Ambulatory Visit: Admitting: Orthopedic Surgery

## 2023-09-24 DIAGNOSIS — D171 Benign lipomatous neoplasm of skin and subcutaneous tissue of trunk: Secondary | ICD-10-CM | POA: Diagnosis not present

## 2023-09-24 NOTE — Progress Notes (Unsigned)
 Office Visit Note   Patient: Katie Benson           Date of Birth: May 25, 1980           MRN: [id number] Visit Date: 09/24/2023 Requested by: No referring provider defined for this encounter. PCP: Default, Provider, MD  Subjective: Chief Complaint  Patient presents with   Left Shoulder - Follow-up    HPI: Katie Benson is a 43 y.o. female who presents to the office reporting left shoulder and scapular pain.  Since she was last seen she has had an MRI scan of that region.  She does have an approximately 5 x 5 cm lipoma medial to the scapula.  This does bother her when she is seated.  Rest of her shoulder MRI scan was unremarkable.  This has been bothering her for over a year.  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                ROS: All systems reviewed are negative as they relate to the chief complaint within the history of present illness.  Patient denies fevers or chills.  Assessment & Plan: Visit Diagnoses: No diagnosis found.  Plan: Impression is symptomatic left scapular region lipoma between the medial border of the scapula and scapular spine.  MRI characteristics are benign.  Per patient request we will excise this lipoma.  Risk and benefits are discussed including not limited to infection neurovascular damage incomplete pain relief as well as incomplete functional restoration.  Patient understands and wishes to proceed.  All questions answered.  Notably she also has some effusion in her left knee and has a known history of arthritis on that side.  She did have an injection with Herlene in June.  We could conceivably repeat that in September/October  Follow-Up Instructions: No follow-ups on file.   Orders:  No orders of the defined types were placed in this encounter.  No orders of the defined types were placed in this encounter.     Procedures: No procedures  performed   Clinical Data: No additional findings.  Objective: Vital Signs: There were no vitals taken for this visit.  Physical Exam:  Constitutional: Patient appears well-developed HEENT:  Head: Normocephalic Eyes:EOM are normal Neck: Normal range of motion Cardiovascular: Normal rate Pulmonary/chest: Effort normal Neurologic: Patient is alert Skin: Skin is warm Psychiatric: Patient has normal mood and affect  Ortho Exam: Ortho exam demonstrates full active and passive range of motion of both shoulders.  Excellent rotator cuff strength on the left infraspinatus supraspinatus and subscap muscle testing.  No masses lymphadenopathy or skin changes noted in that shoulder girdle region.  However between the medial border of the scapula and the scapular spine she does have a lipoma measuring about 5 x 5 cm.  Freely mobile beneath the skin.  Does have a little pain when she retracts the arm.  No lymphadenopathy around this lipoma.  Specialty Comments:  MRI CERVICAL SPINE WITHOUT CONTRAST   TECHNIQUE: Multiplanar, multisequence MR imaging of the cervical spine was performed. No intravenous contrast was administered.   COMPARISON:  CT scan 12/26/2012   FINDINGS: Alignment: No vertebral subluxation is observed.   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 Disc desiccation at all levels between C2 and C7.   Cord: No significant abnormal spinal cord signal is observed.   Posterior Fossa, vertebral arteries, paraspinal tissues: Unremarkable   Disc  levels:   C2-3: Unremarkable.   C3-4: Borderline bilateral foraminal stenosis due to disc bulge and uncinate spurring.   C4-5: Moderate to prominent bilateral foraminal stenosis and borderline central narrowing of the thecal sac due to disc bulge and uncinate spurring.   C5-6: Moderate right and moderate to prominent left foraminal stenosis and borderline central narrowing of the thecal sac due to disc bulge  and uncinate spurring.   C6-7: Mild right and moderate left foraminal stenosis due to disc bulge and uncinate spurring.   C7-T1: No impingement.  Minimal uncinate spurring.   T1-2: Unremarkable.   IMPRESSION: 1. Cervical spondylosis, degenerative disc disease, and mildly congenitally short pedicles contribute to moderate to prominent impingement at C4-5; moderate impingement at C5-6; and mild impingement at C6-7, as detailed above.     Electronically Signed   By: Ryan Salvage M.D.   On: 08/01/2020 09:25  ---------------------------------------------- EXAM: MRI LUMBAR SPINE WITHOUT CONTRAST   TECHNIQUE: Multiplanar, multisequence MR imaging of the lumbar spine was performed. No intravenous contrast was administered.   COMPARISON:  Lumbar spine x-rays dated June 23, 2021. CT abdomen pelvis dated March 26, 2020.   FINDINGS: Segmentation:  Standard.   Alignment:  Physiologic.   Vertebrae:  No fracture, evidence of discitis, or bone lesion.   Conus medullaris and cauda equina: Conus extends to the L1-L2 level. Conus and cauda equina appear normal.   Paraspinal and other soft tissues: Negative.   Disc levels:   T11-T12 to L2-L3: Negative.   L3-L4: Mild disc bulging and bilateral facet arthropathy. Mild bilateral neuroforaminal stenosis. No spinal canal stenosis.   L4-L5: Negative disc. Mild bilateral facet arthropathy. Mild bilateral neuroforaminal stenosis. No spinal canal stenosis.   L5-S1: Negative disc. Mild bilateral facet arthropathy. No stenosis.   IMPRESSION: 1. Mild degenerative changes of the lower lumbar spine as described above. No high-grade stenosis or impingement.     Electronically Signed   By: Elsie ONEIDA Shoulder M.D.   On: 08/19/2021 16:36  Imaging: No results found.   PMFS History: Patient Active Problem List   Diagnosis Date Noted   History of esophageal ulcer 12/22/2021   Ingrown nail of fifth toe 12/22/2021   Chronic toe  pain, left foot 12/22/2021   Blurry vision, bilateral 12/22/2021   Left corneal abrasion 12/22/2021   Osteoarthritis of left knee 12/14/2020   Subacromial bursitis of right shoulder joint 07/15/2020   Cervical radiculopathy 06/24/2020   Effusion, left knee 06/24/2019   Pes anserine bursitis 04/29/2019   Bucket handle tear of lateral meniscus of left knee 12/12/2018   Ganglion cyst 12/12/2018   Fibroids 03/18/2018   S/P parathyroidectomy 02/12/2018   Primary hyperparathyroidism (HCC) 01/08/2018   Hyperparathyroidism , secondary, non-renal (HCC) 01/08/2018   Fibroid uterus 12/06/2017   Hypercalcemia 12/06/2017   Fibroid 10/15/2017   Abnormal uterine bleeding (AUB) 08/20/2017   Anemia 08/20/2017   Past Medical History:  Diagnosis Date   Anemia    Arthritis    Fibroids    GERD (gastroesophageal reflux disease)    History of kidney stones    Neck injury     Family History  Problem Relation Age of Onset   Diabetes Sister    Cancer Maternal Grandmother        ovarian   Hypercalcemia Mother    Other Father        HX Unknown    Past Surgical History:  Procedure Laterality Date   CESAREAN SECTION     IR ANGIOGRAM PELVIS SELECTIVE  OR SUPRASELECTIVE  03/18/2018   IR ANGIOGRAM PELVIS SELECTIVE OR SUPRASELECTIVE  03/18/2018   IR ANGIOGRAM SELECTIVE EACH ADDITIONAL VESSEL  03/18/2018   IR ANGIOGRAM SELECTIVE EACH ADDITIONAL VESSEL  03/18/2018   IR EMBO TUMOR ORGAN ISCHEMIA INFARCT INC GUIDE ROADMAPPING  03/18/2018   IR RADIOLOGIST EVAL & MGMT  10/17/2017   IR RADIOLOGIST EVAL & MGMT  12/20/2017   IR RADIOLOGIST EVAL & MGMT  04/17/2018   IR US  GUIDE VASC ACCESS RIGHT  03/18/2018   KNEE ARTHROSCOPY WITH LATERAL MENISECTOMY Left 12/24/2018   Procedure: LEFT KNEE ARTHROSCOPY WITH LATERAL MENISECTOM, debriment of GANGLION CYST;  Surgeon: Anderson Maude ORN, MD;  Location: [facility];  Service: Orthopedics;  Laterality: Left;   PARATHYROIDECTOMY N/A 02/12/2018   Procedure: PARATHYROIDECTOMY;   Surgeon: Marolyn Nest, MD;  Location: [facility];  Service: General;  Laterality: N/A;   WISDOM TOOTH EXTRACTION     age 50   Social History   Occupational History   Occupation: unemployed  Tobacco Use   Smoking status: Some Days    Current packs/day: 0.25    Average packs/day: 0.3 packs/day for 14.0 years (3.5 ttl pk-yrs)    Types: Cigarettes   Smokeless tobacco: Never  Vaping Use   Vaping status: Never Used  Substance and Sexual Activity   Alcohol use: Yes    Comment: occassionally on weekends   Drug use: Yes    Types: Marijuana    Comment: multiple times a day   Sexual activity: Yes    Partners: Male    Birth control/protection: Condom

## 2023-09-25 ENCOUNTER — Encounter: Payer: Self-pay | Admitting: Orthopedic Surgery

## 2023-09-26 ENCOUNTER — Telehealth: Payer: Self-pay | Admitting: Orthopedic Surgery

## 2023-09-26 ENCOUNTER — Other Ambulatory Visit: Payer: Self-pay

## 2023-09-26 ENCOUNTER — Other Ambulatory Visit: Payer: Self-pay | Admitting: Physical Medicine and Rehabilitation

## 2023-09-26 MED ORDER — DIAZEPAM 5 MG PO TABS
ORAL_TABLET | ORAL | 0 refills | Status: AC
Start: 2023-09-26 — End: ?
  Filled 2023-09-26: qty 1, 1d supply, fill #0

## 2023-09-26 NOTE — Telephone Encounter (Signed)
 Patient is scheduled for left shoulder lipoma removal at [facility] with Dr. [name] on 10-04-23 at 10:45am.  Post op appointment set for 10-12-23 at 1pm.  Patient has questions about post op care with the excision (questions about the dressing, changing it, showering, any topical medication) Please call patient at [phone].  If she doesn't answer the first time, please call back again immediately.

## 2023-09-26 NOTE — Telephone Encounter (Signed)
 I tried calling but could not leave a message on her machine.  She is okay to leave the dressing on until her first postop visit.  Will put an Aquacel on which is a sterile dressing and she should not have to pull around with it.  She should be good to take showers with that dressing.

## 2023-09-27 ENCOUNTER — Encounter (HOSPITAL_BASED_OUTPATIENT_CLINIC_OR_DEPARTMENT_OTHER): Payer: Self-pay | Admitting: Orthopedic Surgery

## 2023-09-27 ENCOUNTER — Other Ambulatory Visit: Payer: Self-pay

## 2023-10-03 ENCOUNTER — Encounter (HOSPITAL_BASED_OUTPATIENT_CLINIC_OR_DEPARTMENT_OTHER)
Admission: RE | Admit: 2023-10-03 | Discharge: 2023-10-03 | Disposition: A | Discharge status: Home / Self Care | Source: Ambulatory Visit | Attending: Orthopedic Surgery | Admitting: Orthopedic Surgery

## 2023-10-03 DIAGNOSIS — F17210 Nicotine dependence, cigarettes, uncomplicated: Secondary | ICD-10-CM | POA: Diagnosis not present

## 2023-10-03 DIAGNOSIS — D171 Benign lipomatous neoplasm of skin and subcutaneous tissue of trunk: Secondary | ICD-10-CM | POA: Diagnosis present

## 2023-10-03 DIAGNOSIS — K219 Gastro-esophageal reflux disease without esophagitis: Secondary | ICD-10-CM | POA: Diagnosis not present

## 2023-10-03 LAB — CBC
HCT: 35.7 % — ABNORMAL LOW (ref 36.0–46.0)
Hemoglobin: 11.4 g/dL — ABNORMAL LOW (ref 12.0–15.0)
MCH: 24.5 pg — ABNORMAL LOW (ref 26.0–34.0)
MCHC: 31.9 g/dL (ref 30.0–36.0)
MCV: 76.6 fL — ABNORMAL LOW (ref 80.0–100.0)
Platelets: 337 K/uL (ref 150–400)
RBC: 4.66 MIL/uL (ref 3.87–5.11)
RDW: 15.3 % (ref 11.5–15.5)
WBC: 7.6 K/uL (ref 4.0–10.5)
nRBC: 0 % (ref 0.0–0.2)

## 2023-10-03 LAB — BASIC METABOLIC PANEL WITH GFR
Anion gap: 7 (ref 5–15)
BUN: 9 mg/dL (ref 6–20)
CO2: 24 mmol/L (ref 22–32)
Calcium: 9 mg/dL (ref 8.9–10.3)
Chloride: 108 mmol/L (ref 98–111)
Creatinine, Ser: 0.7 mg/dL (ref 0.44–1.00)
GFR, Estimated: >60 mL/min (ref >60)
Glucose, Bld: 75 mg/dL (ref 70–99)
Potassium: 4.1 mmol/L (ref 3.5–5.1)
Sodium: 139 mmol/L (ref 135–145)

## 2023-10-03 NOTE — Progress Notes (Signed)
 Sent patient reminder message to come in for pre-op labs today before scheduled procedure tomorrow.

## 2023-10-04 ENCOUNTER — Other Ambulatory Visit: Payer: Self-pay

## 2023-10-04 ENCOUNTER — Ambulatory Visit (HOSPITAL_BASED_OUTPATIENT_CLINIC_OR_DEPARTMENT_OTHER): Payer: Self-pay | Admitting: Anesthesiology

## 2023-10-04 ENCOUNTER — Encounter (HOSPITAL_BASED_OUTPATIENT_CLINIC_OR_DEPARTMENT_OTHER)
Admission: RE | Disposition: A | Discharge status: Home / Self Care | Payer: Self-pay | Source: Home / Self Care | Attending: Orthopedic Surgery

## 2023-10-04 ENCOUNTER — Ambulatory Visit (HOSPITAL_BASED_OUTPATIENT_CLINIC_OR_DEPARTMENT_OTHER)
Admission: RE | Admit: 2023-10-04 | Discharge: 2023-10-04 | Disposition: A | Discharge status: Home / Self Care | Attending: Orthopedic Surgery | Admitting: Orthopedic Surgery

## 2023-10-04 ENCOUNTER — Encounter (HOSPITAL_BASED_OUTPATIENT_CLINIC_OR_DEPARTMENT_OTHER): Payer: Self-pay | Admitting: Anesthesiology

## 2023-10-04 ENCOUNTER — Encounter (HOSPITAL_BASED_OUTPATIENT_CLINIC_OR_DEPARTMENT_OTHER): Payer: Self-pay | Admitting: Orthopedic Surgery

## 2023-10-04 DIAGNOSIS — D1722 Benign lipomatous neoplasm of skin and subcutaneous tissue of left arm: Secondary | ICD-10-CM | POA: Diagnosis not present

## 2023-10-04 DIAGNOSIS — D171 Benign lipomatous neoplasm of skin and subcutaneous tissue of trunk: Secondary | ICD-10-CM

## 2023-10-04 DIAGNOSIS — Z01818 Encounter for other preprocedural examination: Secondary | ICD-10-CM

## 2023-10-04 DIAGNOSIS — F17210 Nicotine dependence, cigarettes, uncomplicated: Secondary | ICD-10-CM | POA: Insufficient documentation

## 2023-10-04 DIAGNOSIS — K219 Gastro-esophageal reflux disease without esophagitis: Secondary | ICD-10-CM | POA: Insufficient documentation

## 2023-10-04 HISTORY — PX: EXCISION MASS, BACK: SHX7560

## 2023-10-04 LAB — POCT PREGNANCY, URINE: Preg Test, Ur: NEGATIVE

## 2023-10-04 SURGERY — EXCISION MASS, BACK
Anesthesia: General | Site: Back | Laterality: Left

## 2023-10-04 MED ORDER — DEXMEDETOMIDINE HCL IN NACL 80 MCG/20ML IV SOLN
INTRAVENOUS | Status: DC | PRN
Start: 2023-10-04 — End: 2023-10-04
  Administered 2023-10-04: 4 ug via INTRAVENOUS

## 2023-10-04 MED ORDER — ARTIFICIAL TEARS OPHTHALMIC OINT
TOPICAL_OINTMENT | OPHTHALMIC | Status: AC
  Filled 2023-10-04: qty 17.5

## 2023-10-04 MED ORDER — LIDOCAINE HCL (CARDIAC) PF 100 MG/5ML IV SOSY
PREFILLED_SYRINGE | INTRAVENOUS | Status: DC | PRN
  Administered 2023-10-04: 60 mg via INTRAVENOUS

## 2023-10-04 MED ORDER — TRANEXAMIC ACID-NACL 1000-0.7 MG/100ML-% IV SOLN
INTRAVENOUS | Status: DC | PRN
  Administered 2023-10-04: 1000 mg via INTRAVENOUS

## 2023-10-04 MED ORDER — FENTANYL CITRATE (PF) 100 MCG/2ML IJ SOLN
INTRAMUSCULAR | Status: AC
  Filled 2023-10-04: qty 2

## 2023-10-04 MED ORDER — OXYCODONE HCL 5 MG/5ML PO SOLN: 5.0000 mg | Freq: Once | ORAL | Status: AC | PRN

## 2023-10-04 MED ORDER — BUPIVACAINE-EPINEPHRINE (PF) 0.5% -1:200000 IJ SOLN
INTRAMUSCULAR | Status: AC
  Filled 2023-10-04: qty 30

## 2023-10-04 MED ORDER — PHENYLEPHRINE 80 MCG/ML (10ML) SYRINGE FOR IV PUSH (FOR BLOOD PRESSURE SUPPORT)
PREFILLED_SYRINGE | INTRAVENOUS | Status: AC
  Filled 2023-10-04: qty 10

## 2023-10-04 MED ORDER — CELECOXIB 100 MG PO CAPS
100.0000 mg | ORAL_CAPSULE | Freq: Two times a day (BID) | ORAL | 0 refills | Status: DC
  Filled 2023-10-04: qty 60, 30d supply, fill #0

## 2023-10-04 MED ORDER — ROCURONIUM BROMIDE 10 MG/ML (PF) SYRINGE
PREFILLED_SYRINGE | INTRAVENOUS | Status: AC
  Filled 2023-10-04: qty 20

## 2023-10-04 MED ORDER — FENTANYL CITRATE (PF) 100 MCG/2ML IJ SOLN
INTRAMUSCULAR | Status: DC | PRN
  Administered 2023-10-04: 50 ug via INTRAVENOUS

## 2023-10-04 MED ORDER — EPHEDRINE 5 MG/ML INJ
INTRAVENOUS | Status: AC
  Filled 2023-10-04: qty 10

## 2023-10-04 MED ORDER — ROCURONIUM BROMIDE 100 MG/10ML IV SOLN
INTRAVENOUS | Status: DC | PRN
Start: 2023-10-04 — End: 2023-10-04
  Administered 2023-10-04: 50 mg via INTRAVENOUS

## 2023-10-04 MED ORDER — OXYCODONE HCL 5 MG PO TABS
ORAL_TABLET | ORAL | Status: AC
  Filled 2023-10-04: qty 1

## 2023-10-04 MED ORDER — OXYCODONE HCL 5 MG PO TABS
5.0000 mg | ORAL_TABLET | ORAL | 0 refills | Status: DC | PRN
  Filled 2023-10-04: qty 30, 5d supply, fill #0

## 2023-10-04 MED ORDER — SUGAMMADEX SODIUM 200 MG/2ML IV SOLN
INTRAVENOUS | Status: DC | PRN
  Administered 2023-10-04: 200 mg via INTRAVENOUS

## 2023-10-04 MED ORDER — HYDROMORPHONE HCL 1 MG/ML IJ SOLN
0.2500 mg | INTRAMUSCULAR | Status: DC | PRN
  Administered 2023-10-04 (×2): 0.25 mg via INTRAVENOUS
  Administered 2023-10-04: 0.5 mg via INTRAVENOUS

## 2023-10-04 MED ORDER — CEFAZOLIN SODIUM-DEXTROSE 2-4 GM/100ML-% IV SOLN
INTRAVENOUS | Status: AC
  Filled 2023-10-04: qty 100

## 2023-10-04 MED ORDER — DEXAMETHASONE SODIUM PHOSPHATE 4 MG/ML IJ SOLN
INTRAMUSCULAR | Status: DC | PRN
  Administered 2023-10-04: 5 mg via INTRAVENOUS

## 2023-10-04 MED ORDER — POVIDONE-IODINE 10 % EX SWAB: 2.0000 | Freq: Once | CUTANEOUS | Status: DC

## 2023-10-04 MED ORDER — ATROPINE SULFATE 0.4 MG/ML IV SOLN
INTRAVENOUS | Status: AC
  Filled 2023-10-04: qty 1

## 2023-10-04 MED ORDER — PROPOFOL 10 MG/ML IV BOLUS
INTRAVENOUS | Status: DC | PRN
  Administered 2023-10-04: 150 mg via INTRAVENOUS

## 2023-10-04 MED ORDER — ONDANSETRON HCL 4 MG/2ML IJ SOLN
INTRAMUSCULAR | Status: AC
  Filled 2023-10-04: qty 4

## 2023-10-04 MED ORDER — PROPOFOL 500 MG/50ML IV EMUL
INTRAVENOUS | Status: AC
  Filled 2023-10-04: qty 100

## 2023-10-04 MED ORDER — AMISULPRIDE (ANTIEMETIC) 5 MG/2ML IV SOLN: 10.0000 mg | Freq: Once | INTRAVENOUS | Status: DC | PRN

## 2023-10-04 MED ORDER — MEPERIDINE HCL 25 MG/ML IJ SOLN: 6.2500 mg | INTRAMUSCULAR | Status: DC | PRN

## 2023-10-04 MED ORDER — DEXAMETHASONE SODIUM PHOSPHATE 10 MG/ML IJ SOLN
INTRAMUSCULAR | Status: AC
  Filled 2023-10-04: qty 2

## 2023-10-04 MED ORDER — MIDAZOLAM HCL 2 MG/2ML IJ SOLN
INTRAMUSCULAR | Status: DC | PRN
  Administered 2023-10-04: 2 mg via INTRAVENOUS

## 2023-10-04 MED ORDER — MIDAZOLAM HCL 2 MG/2ML IJ SOLN
INTRAMUSCULAR | Status: AC
  Filled 2023-10-04: qty 2

## 2023-10-04 MED ORDER — POVIDONE-IODINE 7.5 % EX SOLN
Freq: Once | CUTANEOUS | Status: DC
  Filled 2023-10-04: qty 118

## 2023-10-04 MED ORDER — LACTATED RINGERS IV SOLN: INTRAVENOUS | Status: DC

## 2023-10-04 MED ORDER — 0.9 % SODIUM CHLORIDE (POUR BTL) OPTIME
TOPICAL | Status: DC | PRN
  Administered 2023-10-04: 200 mL

## 2023-10-04 MED ORDER — BUPIVACAINE HCL (PF) 0.5 % IJ SOLN
INTRAMUSCULAR | Status: AC
  Filled 2023-10-04: qty 30

## 2023-10-04 MED ORDER — CLONIDINE HCL (ANALGESIA) 100 MCG/ML EP SOLN
EPIDURAL | Status: AC
  Filled 2023-10-04: qty 10

## 2023-10-04 MED ORDER — CEFAZOLIN SODIUM-DEXTROSE 2-4 GM/100ML-% IV SOLN
2.0000 g | INTRAVENOUS | Status: AC
  Administered 2023-10-04: 2 g via INTRAVENOUS

## 2023-10-04 MED ORDER — MORPHINE SULFATE (PF) 4 MG/ML IV SOLN
INTRAVENOUS | Status: AC
  Filled 2023-10-04: qty 1

## 2023-10-04 MED ORDER — OXYCODONE HCL 5 MG PO TABS
5.0000 mg | ORAL_TABLET | Freq: Once | ORAL | Status: AC | PRN
  Administered 2023-10-04: 5 mg via ORAL

## 2023-10-04 MED ORDER — HYDROMORPHONE HCL 1 MG/ML IJ SOLN
INTRAMUSCULAR | Status: AC
Start: 2023-10-04 — End: 2023-10-04
  Filled 2023-10-04: qty 0.5

## 2023-10-04 MED ORDER — LIDOCAINE 2% (20 MG/ML) 5 ML SYRINGE
INTRAMUSCULAR | Status: AC
  Filled 2023-10-04: qty 5

## 2023-10-04 MED ORDER — DEXMEDETOMIDINE HCL IN NACL 80 MCG/20ML IV SOLN
INTRAVENOUS | Status: AC
  Filled 2023-10-04: qty 40

## 2023-10-04 MED ORDER — BUPIVACAINE HCL (PF) 0.5 % IJ SOLN
INTRAMUSCULAR | Status: DC | PRN
Start: 2023-10-04 — End: 2023-10-04
  Administered 2023-10-04: 30 mL

## 2023-10-04 MED ORDER — TRANEXAMIC ACID-NACL 1000-0.7 MG/100ML-% IV SOLN
INTRAVENOUS | Status: AC
  Filled 2023-10-04: qty 100

## 2023-10-04 MED ORDER — SODIUM CHLORIDE 0.9 % IV SOLN
12.5000 mg | INTRAVENOUS | Status: DC | PRN
  Filled 2023-10-04: qty 0.5

## 2023-10-04 MED ORDER — ONDANSETRON HCL 4 MG/2ML IJ SOLN
INTRAMUSCULAR | Status: DC | PRN
  Administered 2023-10-04: 4 mg via INTRAVENOUS

## 2023-10-04 SURGICAL SUPPLY — 46 items
BLADE SURG 15 STRL LF DISP TIS (BLADE) ×1 IMPLANT
BNDG COMPR ESMARK 6X3 LF (GAUZE/BANDAGES/DRESSINGS) ×1 IMPLANT
BNDG ELASTIC 4INX 5YD STR LF (GAUZE/BANDAGES/DRESSINGS) IMPLANT
BNDG ELASTIC 6INX 5YD STR LF (GAUZE/BANDAGES/DRESSINGS) IMPLANT
BNDG GAUZE DERMACEA FLUFF 4 (GAUZE/BANDAGES/DRESSINGS) IMPLANT
CANISTER SUCT 1200ML W/VALVE (MISCELLANEOUS) IMPLANT
CLEANER CAUTERY TIP PAD (MISCELLANEOUS) IMPLANT
DRAPE C-ARM 42X72 X-RAY (DRAPES) ×1 IMPLANT
DRAPE EXTREMITY T 121X128X90 (DISPOSABLE) ×1 IMPLANT
DRAPE SURG 17X23 STRL (DRAPES) IMPLANT
DRSG AQUACEL AG ADV 3.5X 6 (GAUZE/BANDAGES/DRESSINGS) IMPLANT
DRSG EMULSION OIL 3X3 NADH (GAUZE/BANDAGES/DRESSINGS) IMPLANT
DURAPREP 26ML APPLICATOR (WOUND CARE) ×1 IMPLANT
ELECTRODE REM PT RTRN 9FT ADLT (ELECTROSURGICAL) ×1 IMPLANT
GAUZE 4X4 16PLY ~~LOC~~+RFID DBL (SPONGE) IMPLANT
GAUZE XEROFORM 1X8 LF (GAUZE/BANDAGES/DRESSINGS) IMPLANT
GLOVE BIO SURGEON STRL SZ7 (GLOVE) ×1 IMPLANT
GLOVE BIOGEL PI IND STRL 7.0 (GLOVE) ×1 IMPLANT
GOWN STRL REUS W/ TWL LRG LVL3 (GOWN DISPOSABLE) ×1 IMPLANT
NDL HYPO 22X1.5 SAFETY MO (MISCELLANEOUS) IMPLANT
NEEDLE HYPO 22X1.5 SAFETY MO (MISCELLANEOUS) IMPLANT
NS IRRIG 1000ML POUR BTL (IV SOLUTION) ×1 IMPLANT
PACK ARTHROSCOPY DSU (CUSTOM PROCEDURE TRAY) ×1 IMPLANT
PACK BASIN DAY SURGERY FS (CUSTOM PROCEDURE TRAY) ×1 IMPLANT
PAD CAST 4YDX4 CTTN HI CHSV (CAST SUPPLIES) IMPLANT
PADDING CAST ABS COTTON 4X4 ST (CAST SUPPLIES) ×1 IMPLANT
PADDING CAST COTTON 6X4 STRL (CAST SUPPLIES) IMPLANT
PENCIL SMOKE EVACUATOR (MISCELLANEOUS) ×1 IMPLANT
SHEET MEDIUM DRAPE 40X70 STRL (DRAPES) IMPLANT
SPIKE FLUID TRANSFER (MISCELLANEOUS) IMPLANT
SPLINT PLASTER CAST FAST 5X30 (CAST SUPPLIES) IMPLANT
SPLINT PLASTER CAST XFAST 3X15 (CAST SUPPLIES) IMPLANT
SPLINT PLASTER CAST XFAST 4X15 (CAST SUPPLIES) IMPLANT
STRIP CLOSURE SKIN 1/2X4 (GAUZE/BANDAGES/DRESSINGS) IMPLANT
SUCTION TUBE FRAZIER 10FR DISP (SUCTIONS) IMPLANT
SUT MON AB 3-0 SH27 (SUTURE) IMPLANT
SUT PROLENE 3 0 PS 2 (SUTURE) IMPLANT
SUT VIC AB 0 CT1 27XBRD ANBCTR (SUTURE) IMPLANT
SUT VIC AB 2-0 CT3 27 (SUTURE) IMPLANT
SUT VIC AB 2-0 SH 27XBRD (SUTURE) IMPLANT
SUT VIC AB 3-0 SH 27X BRD (SUTURE) IMPLANT
SYR BULB EAR ULCER 3OZ GRN STR (SYRINGE) ×1 IMPLANT
SYR CONTROL 10ML LL (SYRINGE) IMPLANT
TOWEL GREEN STERILE FF (TOWEL DISPOSABLE) ×1 IMPLANT
UNDERPAD 30X36 HEAVY ABSORB (UNDERPADS AND DIAPERS) ×1 IMPLANT
YANKAUER SUCT BULB TIP NO VENT (SUCTIONS) IMPLANT

## 2023-10-04 NOTE — H&P (Signed)
 Katie Benson is an 43 y.o. female.   Chief Complaint: back lipoma HPI:  Katie Benson is a 43 y.o. female who presents  reporting left shoulder and scapular pain.  Since she was last seen she has had an MRI scan of that region.  She does have an approximately 5 x 5 cm lipoma medial to the scapula.  This does bother her when she is seated.  Rest of her shoulder MRI scan was unremarkable.  This has been bothering her for over a year.  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 this lipoma does bother her during that physical exertion   Past Medical History:  Diagnosis Date   Anemia    Arthritis    Fibroids    GERD (gastroesophageal reflux disease)    History of kidney stones    Neck injury     Past Surgical History:  Procedure Laterality Date   CESAREAN SECTION     IR ANGIOGRAM PELVIS SELECTIVE OR SUPRASELECTIVE  03/18/2018   IR ANGIOGRAM PELVIS SELECTIVE OR SUPRASELECTIVE  03/18/2018   IR ANGIOGRAM SELECTIVE EACH ADDITIONAL VESSEL  03/18/2018   IR ANGIOGRAM SELECTIVE EACH ADDITIONAL VESSEL  03/18/2018   IR EMBO TUMOR ORGAN ISCHEMIA INFARCT INC GUIDE ROADMAPPING  03/18/2018   IR RADIOLOGIST EVAL & MGMT  10/17/2017   IR RADIOLOGIST EVAL & MGMT  12/20/2017   IR RADIOLOGIST EVAL & MGMT  04/17/2018   IR US  GUIDE VASC ACCESS RIGHT  03/18/2018   KNEE ARTHROSCOPY WITH LATERAL MENISECTOMY Left 12/24/2018   Procedure: LEFT KNEE ARTHROSCOPY WITH LATERAL MENISECTOM, debriment of GANGLION CYST;  Surgeon: Anderson Maude ORN, MD;  Location: [facility];  Service: Orthopedics;  Laterality: Left;   PARATHYROIDECTOMY N/A 02/12/2018   Procedure: PARATHYROIDECTOMY;  Surgeon: Marolyn Nest, MD;  Location: [facility];  Service: General;  Laterality: N/A;   WISDOM TOOTH EXTRACTION     age 56    Family History  Problem Relation Age of Onset   Diabetes Sister    Cancer Maternal Grandmother        ovarian   Hypercalcemia Mother    Other Father        HX Unknown   Social  History:  reports that she has been smoking cigarettes. She has a 3.5 pack-year smoking history. She has never used smokeless tobacco. She reports current alcohol use. She reports current drug use. Drug: Marijuana.  Allergies:  Allergies  Allergen Reactions   Pork-Derived Products Hives and Swelling    Facility-Administered Medications Prior to Admission  Medication Dose Route Frequency Provider Last Rate Last Admin   0.9 %  sodium chloride  infusion  500 mL Intravenous Once Nandigam, Kavitha V, MD       Medications Prior to Admission  Medication Sig Dispense Refill   cyclobenzaprine  (FLEXERIL ) 10 MG tablet Take 1 tablet (10 mg total) by mouth 3 (three) times daily. Do not take and operate motor vehicle. 90 tablet 1   desipramine  (NORPRAMIN ) 50 MG tablet Take 1 tablet (50 mg total) by mouth at bedtime. MUST SCHEDULE OFFICE VISIT 90 tablet 3   docusate sodium  (STOOL SOFTENER) 100 MG capsule Take 1 capsule (100 mg total) by mouth 2 (two) times daily. 180 capsule 3   doxycycline  (VIBRA -TABS) 100 MG tablet Take 1 tablet (100 mg total) by mouth 2 (two) times daily. 14 tablet 0   gabapentin  (NEURONTIN ) 300 MG capsule Take 1 capsule (300 mg total) by mouth 3 (three) times daily. DO NOT TAKE  AND OPERATE VEHICLE. 90 capsule 1   pantoprazole  (PROTONIX ) 40 MG tablet Take 1 tablet (40 mg total) by mouth daily. 90 tablet 3   zolpidem  (AMBIEN ) 10 MG tablet Take 1 tablet (10mg ) by mouth an hour before procedure with light food. 1 tablet 0   AMBULATORY NON FORMULARY MEDICATION Medication Name:  Nitroglycerin  ointment 0.125% three times daily use pea sized amount per rectum for 6-8 weeks 30 g 1   diazepam  (VALIUM ) 5 MG tablet Take one tablet by mouth with light food one hour prior to procedure. (Patient not taking: Reported on 09/27/2023) 1 tablet 0   hydrocortisone  (ANUSOL -HC) 2.5 % rectal cream Place 1 Application rectally 2 (two) times daily as needed for hemorrhoids or anal itching. 30 g 0   lidocaine   (LIDODERM ) 5 % Apply 2 patches topically daily as needed. Leave patch on for maximum duration of 12 hours.  Do not place 2 patches on one area within 24 hours 30 patch 1   Lidocaine , Anorectal, (RECTICARE) 5 % CREA Apply 1 Application topically every 4 (four) hours as needed. 28 g 1   Urea  (UREA  NAIL) 45 % GEL Apply 1 Application topically daily. 28 mL 1    Results for orders placed or performed during the hospital encounter of 10/04/23 (from the past 48 hours)  CBC     Status: Abnormal   Collection Time: 10/03/23  2:00 PM  Result Value Ref Range   WBC 7.6 4.0 - 10.5 K/uL   RBC 4.66 3.87 - 5.11 MIL/uL   Hemoglobin 11.4 (L) 12.0 - 15.0 g/dL   HCT 64.2 (L) 63.9 - 53.9 %   MCV 76.6 (L) 80.0 - 100.0 fL   MCH 24.5 (L) 26.0 - 34.0 pg   MCHC 31.9 30.0 - 36.0 g/dL   RDW 84.6 88.4 - 84.4 %   Platelets 337 150 - 400 K/uL   nRBC 0.0 0.0 - 0.2 %    Comment: Performed at [facility], [address] [place], [place] [address]  Basic metabolic panel     Status: None   Collection Time: 10/03/23  2:00 PM  Result Value Ref Range   Sodium 139 135 - 145 mmol/L   Potassium 4.1 3.5 - 5.1 mmol/L   Chloride 108 98 - 111 mmol/L   CO2 24 22 - 32 mmol/L   Glucose, Bld 75 70 - 99 mg/dL    Comment: Glucose reference range applies only to samples taken after fasting for at least 8 hours.   BUN 9 6 - 20 mg/dL   Creatinine, Ser 9.29 0.44 - 1.00 mg/dL   Calcium  9.0 8.9 - 10.3 mg/dL   GFR, Estimated >39 >39 mL/min    Comment: (NOTE) Calculated using the CKD-EPI Creatinine Equation (2021)    Anion gap 7 5 - 15    Comment: Performed at [facility], [address] [place], [place] [address]  Pregnancy, urine POC     Status: None   Collection Time: 10/04/23  9:32 AM  Result Value Ref Range   Preg Test, Ur NEGATIVE NEGATIVE    Comment:        THE SENSITIVITY OF THIS METHODOLOGY IS >20 mIU/mL.    No results found.  Review of Systems  Musculoskeletal:  Positive for arthralgias.  All other  systems reviewed and are negative.   Blood pressure 119/78, pulse 88, temperature 98.1 F (36.7 C), temperature source Temporal, resp. rate 19, height 5' 2.5 (1.588 m), weight 67.2 kg,  last menstrual period 09/13/2023, SpO2 100%. Physical Exam Vitals reviewed.  HENT:     Head: Normocephalic.     Nose: Nose normal.     Mouth/Throat:     Mouth: Mucous membranes are moist.  Eyes:     Pupils: Pupils are equal, round, and reactive to light.  Cardiovascular:     Rate and Rhythm: Normal rate.     Pulses: Normal pulses.  Pulmonary:     Effort: Pulmonary effort is normal.  Abdominal:     General: Abdomen is flat.  Musculoskeletal:     Cervical back: Normal range of motion.  Skin:    General: Skin is warm.     Capillary Refill: Capillary refill takes less than 2 seconds.  Neurological:     General: No focal deficit present.     Mental Status: She is alert.  Psychiatric:        Mood and Affect: Mood normal.     Ortho exam demonstrates full active and passive range of motion of both shoulders.  Excellent rotator cuff strength on the left infraspinatus supraspinatus and subscap muscle testing.  No masses lymphadenopathy or skin changes noted in that shoulder girdle region.  However between the medial border of the scapula and the scapular spine she does have a lipoma measuring about 5 x 5 cm.  Freely mobile beneath the skin.  Does have a little pain when she retracts the arm.  No lymphadenopathy around this lipoma.   Assessment/Plan  Impression is symptomatic left scapular region lipoma between the medial border of the scapula and scapular spine.  MRI characteristics are benign.  Per patient request we will excise this lipoma.  Risk and benefits are discussed including not limited to infection neurovascular damage incomplete pain relief as well as incomplete functional restoration.  Patient understands and wishes to proceed.  All questions answered.  Notably she also has some effusion in her  left knee and has a known history of arthritis on that side.  She did have an injection with Herlene in June.  We could conceivably repeat that in September/October   G Scott Zita Ozimek, MD 10/04/2023, 10:04 AM

## 2023-10-04 NOTE — Anesthesia Procedure Notes (Signed)
 Procedure Name: Intubation Date/Time: 10/04/2023 11:14 AM  Performed by: Donnell Berwyn SQUIBB, CRNAPre-anesthesia Checklist: Patient identified, Emergency Drugs available, Suction available, Patient being monitored and Timeout performed Patient Re-evaluated:Patient Re-evaluated prior to induction Oxygen Delivery Method: Circle system utilized Preoxygenation: Pre-oxygenation with 100% oxygen Induction Type: IV induction Ventilation: Mask ventilation without difficulty Laryngoscope Size: Mac and 3 Grade View: Grade I Tube type: Oral Tube size: 7.0 mm Number of attempts: 1 Airway Equipment and Method: Stylet Placement Confirmation: ETT inserted through vocal cords under direct vision, positive ETCO2 and breath sounds checked- equal and bilateral Secured at: 22 cm Tube secured with: Tape Dental Injury: Teeth and Oropharynx as per pre-operative assessment

## 2023-10-04 NOTE — Anesthesia Preprocedure Evaluation (Signed)
 Anesthesia Evaluation  Patient identified by MRN, date of birth, ID band Patient awake    Reviewed: Allergy & Precautions, H&P , NPO status , Patient's Chart, lab work & pertinent test results  Airway Mallampati: I       Dental  (+) Chipped   Pulmonary neg COPD, Current Smoker and Patient abstained from smoking. 3.5 pack year history   Pulmonary exam normal        Cardiovascular negative cardio ROS Normal cardiovascular exam     Neuro/Psych negative neurological ROS  negative psych ROS   GI/Hepatic ,GERD  ,,(+)     substance abuse  marijuana use  Endo/Other  Hyperparathyroidism s/p parathyroidectomy  Renal/GU Hx nephrolithiasis   Uterine fibroids    Musculoskeletal Torn left lateral meniscus    Abdominal Normal abdominal exam  (+)   Peds  Hematology  (+) Blood dyscrasia, anemia hct 40.1, plt 375   Anesthesia Other Findings        Reproductive/Obstetrics negative OB ROS                              Anesthesia Physical Anesthesia Plan  ASA: II  Anesthesia Plan: General   Post-op Pain Management: Minimal or no pain anticipated   Induction: Intravenous  PONV Risk Score and Plan: 2 and Treatment may vary due to age or medical condition, Ondansetron  and Midazolam   Airway Management Planned: LMA  Additional Equipment: None  Intra-op Plan:   Post-operative Plan: Extubation in OR  Informed Consent: I have reviewed the patients History and Physical, chart, labs and discussed the procedure including the risks, benefits and alternatives for the proposed anesthesia with the patient or authorized representative who has indicated his/her understanding and acceptance.       Plan Discussed with: CRNA  Anesthesia Plan Comments:          Anesthesia Quick Evaluation

## 2023-10-04 NOTE — Anesthesia Postprocedure Evaluation (Signed)
 Anesthesia Post Note  Patient: Katie Benson  Procedure(s) Performed: EXCISION MASS, BACK (Left: Back)     Patient location during evaluation: PACU Anesthesia Type: General Level of consciousness: awake and alert Pain management: pain level controlled Vital Signs Assessment: post-procedure vital signs reviewed and stable Respiratory status: spontaneous breathing, nonlabored ventilation and respiratory function stable Cardiovascular status: blood pressure returned to baseline and stable Postop Assessment: no apparent nausea or vomiting Anesthetic complications: no   No notable events documented.  Last Vitals:  Vitals:   10/04/23 1330 10/04/23 1345  BP: 131/85 136/77  Pulse: 65 72  Resp: 14 16  Temp:  (!) 36.4 C  SpO2: 95% 95%    Last Pain:  Vitals:   10/04/23 1345  TempSrc:   PainSc: 3                  Butler Levander Pinal

## 2023-10-04 NOTE — Transfer of Care (Signed)
 Immediate Anesthesia Transfer of Care Note  Patient: Katie Benson  Procedure(s) Performed: EXCISION MASS, BACK (Left: Back)  Patient Location: PACU  Anesthesia Type:General  Level of Consciousness: drowsy  Airway & Oxygen Therapy: Patient Spontanous Breathing and Patient connected to nasal cannula oxygen  Post-op Assessment: Report given to RN and Post -op Vital signs reviewed and stable  Post vital signs: Reviewed and stable  Last Vitals:  Vitals Value Taken Time  BP 110/74   Temp    Pulse 76   Resp 13   SpO2 100     Last Pain:  Vitals:   10/04/23 0959  TempSrc: Temporal  PainSc: 0-No pain         Complications: No notable events documented.

## 2023-10-05 ENCOUNTER — Encounter (HOSPITAL_BASED_OUTPATIENT_CLINIC_OR_DEPARTMENT_OTHER): Payer: Self-pay | Admitting: Orthopedic Surgery

## 2023-10-05 ENCOUNTER — Encounter: Payer: Self-pay | Admitting: Orthopedic Surgery

## 2023-10-05 LAB — SURGICAL PATHOLOGY

## 2023-10-05 NOTE — Telephone Encounter (Signed)
 I think the restrictions for her would be no heavy lifting more than 1 to 2 pounds.  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  This is not good for her, let me know

## 2023-10-09 NOTE — Brief Op Note (Signed)
   10/04/2023  11:01 AM  PATIENT:  Katie Benson  43 y.o. female  PRE-OPERATIVE DIAGNOSIS:  left shoulder lipoma on back  POST-OPERATIVE DIAGNOSIS:  left shoulder lipoma on back  PROCEDURE:  Procedure(s): EXCISION MASS, BACK  SURGEON:  Surgeon(s): [name], Cordella Hamilton, MD  ASSISTANT: Magnant PA  ANESTHESIA:   General  EBL: 20 ml    No intake/output data recorded.  BLOOD ADMINISTERED: none  DRAINS: None  LOCAL MEDICATIONS USED: Marcaine  morphine  clonidine   SPECIMEN:  No Specimen  COUNTS:  YES  TOURNIQUET:  * No tourniquets in log *  DICTATION: .Other Dictation: Dictation Number [id number]  PLAN OF CARE: Discharge to home after PACU  PATIENT DISPOSITION:  PACU - hemodynamically stable

## 2023-10-10 NOTE — Op Note (Signed)
 Katie Benson, [facility] RECORD NO: [id number] ACCOUNT NO: [id number] DATE OF BIRTH: [birth year] FACILITY: [facility] LOCATION: [facility] PHYSICIAN: Cordella RAMAN. [name], MD  Operative Report   DATE OF PROCEDURE: 10/04/2023  PREOPERATIVE DIAGNOSIS: Left back lipoma.  POSTOPERATIVE DIAGNOSIS: Left back lipoma.  PROCEDURE: Excision of left back lipoma.  SURGEON: Cordella RAMAN. [name], MD.  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 MRI scan confirms benign characteristics of the lipoma. Presents now for operative management after failure of  conservative management.  DESCRIPTION OF PROCEDURE: The patient was brought to the operating room where general anesthesia was induced. Preoperative antibiotics were administered, timeout was called. The patient was placed prone with arms in good position, and bony prominences  were well padded. The back was then prescrubbed with alcohol and Betadine . Allowed to air dry. Prepped with DuraPrep solution and draped in a sterile manner. Ioban was used to cover the operative field. Timeout was called. A longitudinal incision was  made about 6 cm over the lipoma, which was freely mobile beneath the subcutaneous tissue. Skin and subcutaneous tissue were sharply divided. A plane was developed between the subdermal tissue and the lipoma. The lipoma was then excised circumferentially,  with bleeding points encountered controlled using regular electrocautery. Took the lipoma off down to the fascia on both sides of the incision. The lipoma was sent for pathologic analysis. Thorough irrigation was then performed. Dead space was closed  using 2-0 Vicryl between the fascia and the subdermal tissue. The skin was then closed using 0 Vicryl suture, 2-0 Vicryl suture, and 3-0 Monocryl with Steri-Strips and Aquacel dressing applied. Luke's assistance was  required at all times for retraction,  opening, closing, and mobilization of tissue. His assistance was a medical necessity.    [email or web address] D: 10/09/2023 11:03:35 am T: 10/10/2023 1:02:00 am  JOB: [id number]/ [id number]

## 2023-10-12 ENCOUNTER — Other Ambulatory Visit: Payer: Self-pay | Admitting: Surgical

## 2023-10-12 ENCOUNTER — Other Ambulatory Visit: Payer: Self-pay

## 2023-10-12 ENCOUNTER — Ambulatory Visit (INDEPENDENT_AMBULATORY_CARE_PROVIDER_SITE_OTHER): Admitting: Surgical

## 2023-10-12 ENCOUNTER — Encounter: Payer: Self-pay | Admitting: Surgical

## 2023-10-12 DIAGNOSIS — D171 Benign lipomatous neoplasm of skin and subcutaneous tissue of trunk: Secondary | ICD-10-CM

## 2023-10-12 MED ORDER — OXYCODONE HCL 5 MG PO TABS
5.0000 mg | ORAL_TABLET | Freq: Three times a day (TID) | ORAL | 0 refills | Status: AC | PRN
  Filled 2023-10-12: qty 30, 10d supply, fill #0

## 2023-10-12 NOTE — Progress Notes (Signed)
 Post-Op Visit Note   Patient: Katie Benson           Date of Birth: Sep 13, 1980           MRN: [id number] Visit Date: 10/12/2023 PCP: Patient, No Pcp Per   Assessment & Plan:  Chief Complaint:  Chief Complaint  Patient presents with   Left Shoulder - Routine Post Op    LEFT SHOULDER LIPOMA REMOVAL (surgery date 10-04-23)   Visit Diagnoses: No diagnosis found.  Plan: Patient is a 43 year old female who presents s/p scapular lipoma removal on 10/04/2023.  Overall she is doing okay.  Has a lot of pain around the incision site.  No fevers or chills.  Pain is worse with shoulder range of motion or pressure on her back.  She has returned to work and light duty capacity but is not doing any significant lifting.  Takes oxycodone  only at night and is taking muscle relaxer to help with the muscle spasms she gets.  On exam, patient has intact EPL, FPL, finger abduction, pronation/supination, bicep, tricep, deltoid strength rated 5/5 bilaterally.  Shoulder shrug intact of both shoulders.  Actually nerve intact with deltoid firing bilaterally.  Incision looks to be healing well without evidence of infection or dehiscence.  No evidence of seroma or hematoma formation.  No expressible drainage.  Plan at this time is continue with light duty for 3 weeks.  Continue to hold off on any significant lifting more than about 5 pounds until next follow-up visit.  Follow-up in 4 weeks for clinical recheck and repeat left knee injection at that time as well.  Follow-Up Instructions: Return in about 4 weeks (around 11/09/2023).   Orders:  No orders of the defined types were placed in this encounter.  No orders of the defined types were placed in this encounter.   Imaging: No results found.  PMFS History: Patient Active Problem List   Diagnosis Date Noted   History of esophageal ulcer 12/22/2021   Ingrown nail of fifth toe 12/22/2021   Chronic toe pain, left foot 12/22/2021   Blurry vision,  bilateral 12/22/2021   Left corneal abrasion 12/22/2021   Osteoarthritis of left knee 12/14/2020   Subacromial bursitis of right shoulder joint 07/15/2020   Cervical radiculopathy 06/24/2020   Effusion, left knee 06/24/2019   Pes anserine bursitis 04/29/2019   Bucket handle tear of lateral meniscus of left knee 12/12/2018   Ganglion cyst 12/12/2018   Fibroids 03/18/2018   S/P parathyroidectomy 02/12/2018   Primary hyperparathyroidism (HCC) 01/08/2018   Hyperparathyroidism , secondary, non-renal (HCC) 01/08/2018   Fibroid uterus 12/06/2017   Hypercalcemia 12/06/2017   Fibroid 10/15/2017   Abnormal uterine bleeding (AUB) 08/20/2017   Anemia 08/20/2017   Past Medical History:  Diagnosis Date   Anemia    Arthritis    Fibroids    GERD (gastroesophageal reflux disease)    History of kidney stones    Neck injury     Family History  Problem Relation Age of Onset   Diabetes Sister    Cancer Maternal Grandmother        ovarian   Hypercalcemia Mother    Other Father        HX Unknown    Past Surgical History:  Procedure Laterality Date   CESAREAN SECTION     EXCISION MASS, BACK Left 10/04/2023   Procedure: EXCISION MASS, BACK;  Surgeon: [name] Cordella Hamilton, MD;  Location: [facility];  Service: Orthopedics;  Laterality: Left;  left  shoulder lipoma removal   IR ANGIOGRAM PELVIS SELECTIVE OR SUPRASELECTIVE  03/18/2018   IR ANGIOGRAM PELVIS SELECTIVE OR SUPRASELECTIVE  03/18/2018   IR ANGIOGRAM SELECTIVE EACH ADDITIONAL VESSEL  03/18/2018   IR ANGIOGRAM SELECTIVE EACH ADDITIONAL VESSEL  03/18/2018   IR EMBO TUMOR ORGAN ISCHEMIA INFARCT INC GUIDE ROADMAPPING  03/18/2018   IR RADIOLOGIST EVAL & MGMT  10/17/2017   IR RADIOLOGIST EVAL & MGMT  12/20/2017   IR RADIOLOGIST EVAL & MGMT  04/17/2018   IR US  GUIDE VASC ACCESS RIGHT  03/18/2018   KNEE ARTHROSCOPY WITH LATERAL MENISECTOMY Left 12/24/2018   Procedure: LEFT KNEE ARTHROSCOPY WITH LATERAL MENISECTOM, debriment of GANGLION  CYST;  Surgeon: Anderson Maude ORN, MD;  Location: [facility];  Service: Orthopedics;  Laterality: Left;   PARATHYROIDECTOMY N/A 02/12/2018   Procedure: PARATHYROIDECTOMY;  Surgeon: Marolyn Nest, MD;  Location: [facility];  Service: General;  Laterality: N/A;   WISDOM TOOTH EXTRACTION     age 62   Social History   Occupational History   Occupation: unemployed  Tobacco Use   Smoking status: Some Days    Current packs/day: 0.25    Average packs/day: 0.3 packs/day for 14.0 years (3.5 ttl pk-yrs)    Types: Cigarettes   Smokeless tobacco: Never  Vaping Use   Vaping status: Never Used  Substance and Sexual Activity   Alcohol use: Yes    Comment: occassionally on weekends   Drug use: Yes    Types: Marijuana    Comment: multiple times a day   Sexual activity: Yes    Partners: Male    Birth control/protection: Condom

## 2023-10-15 ENCOUNTER — Other Ambulatory Visit: Payer: Self-pay

## 2023-10-15 MED ORDER — GABAPENTIN 300 MG PO CAPS
300.0000 mg | ORAL_CAPSULE | Freq: Three times a day (TID) | ORAL | 1 refills | Status: DC
  Filled 2023-10-15 – 2023-10-17 (×2): qty 90, 30d supply, fill #0
  Filled 2023-11-13: qty 90, 30d supply, fill #1

## 2023-10-17 ENCOUNTER — Other Ambulatory Visit: Payer: Self-pay

## 2023-10-17 ENCOUNTER — Ambulatory Visit: Admitting: Physical Medicine and Rehabilitation

## 2023-10-17 VITALS — BP 116/81

## 2023-10-17 DIAGNOSIS — M5412 Radiculopathy, cervical region: Secondary | ICD-10-CM

## 2023-10-17 MED ORDER — METHYLPREDNISOLONE ACETATE 40 MG/ML IJ SUSP
40.0000 mg | Freq: Once | INTRAMUSCULAR | Status: AC
  Administered 2023-10-17: 40 mg

## 2023-10-17 NOTE — Progress Notes (Signed)
 Katie Benson - 43 y.o. female MRN [id number]  Date of birth: [birth year]  Office Visit Note: Visit Date: 10/17/2023 PCP: Patient, No Pcp Per Referred by: Georgina Ozell LABOR, MD  Subjective: Chief Complaint  Patient presents with   Left Shoulder - Pain   HPI:  Katie Benson is a 43 y.o. female who comes in today for planned repeat Right C7-T1  Cervical Interlaminar epidural steroid injection with fluoroscopic guidance.  The patient has failed conservative care including home exercise, medications, time and activity modification.  This injection will be diagnostic and hopefully therapeutic.  Please see requesting physician notes for further details and justification. Patient received more than 50% pain relief from prior injection.   Referring: Dr. Ozell Georgina   ROS Otherwise per HPI.  Assessment & Plan: Visit Diagnoses:    ICD-10-CM   1. Cervical radiculopathy  M54.12 XR C-ARM NO REPORT    Epidural Steroid injection    methylPREDNISolone  acetate (DEPO-MEDROL ) injection 40 mg      Plan: No additional findings.   Meds & Orders:  Meds ordered this encounter  Medications   methylPREDNISolone  acetate (DEPO-MEDROL ) injection 40 mg    Orders Placed This Encounter  Procedures   XR C-ARM NO REPORT   Epidural Steroid injection    Follow-up: Return for visit to requesting provider as needed.   Procedures: No procedures performed  Cervical Epidural Steroid Injection - Interlaminar Approach with Fluoroscopic Guidance  Patient: Katie Benson      Date of Birth: 11/26/1980 MRN: [id number] PCP: Patient, No Pcp Per      Visit Date: 10/17/2023   Universal Protocol:    Date/Time: 08/24/257:24 PM  Consent Given By: the patient  Position: PRONE  Additional Comments: Vital signs were monitored before and after the procedure. Patient was prepped and draped in the usual sterile fashion. The correct patient, procedure, and site was verified.   Injection  Procedure Details:   Procedure diagnoses: Cervical radiculopathy [M54.12]    Meds Administered:  Meds ordered this encounter  Medications   methylPREDNISolone  acetate (DEPO-MEDROL ) injection 40 mg     Laterality: Right  Location/Site: C7-T1  Needle: 3.5 in., 20 ga. Tuohy  Needle Placement: Paramedian epidural space  Findings:  -Comments: Excellent flow of contrast into the epidural space.  Procedure Details: Using a paramedian approach from the side mentioned above, the region overlying the inferior lamina was localized under fluoroscopic visualization and the soft tissues overlying this structure were infiltrated with 4 ml. of 1% Lidocaine  without Epinephrine . A # 20 gauge, Tuohy needle was inserted into the epidural space using a paramedian approach.  The epidural space was localized using loss of resistance along with contralateral oblique bi-planar fluoroscopic views.  After negative aspirate for air, blood, and CSF, a 2 ml. volume of Isovue -250 was injected into the epidural space and the flow of contrast was observed. Radiographs were obtained for documentation purposes.   The injectate was administered into the level noted above.  Additional Comments:  The patient tolerated the procedure well Dressing: 2 x 2 sterile gauze and Band-Aid    Post-procedure details: Patient was observed during the procedure. Post-procedure instructions were reviewed.  Patient left the clinic in stable condition.   Clinical History: MRI CERVICAL SPINE WITHOUT CONTRAST   TECHNIQUE: Multiplanar, multisequence MR imaging of the cervical spine was performed. No intravenous contrast was administered.   COMPARISON:  CT scan 12/26/2012   FINDINGS: Alignment: No vertebral subluxation is observed.   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 Disc desiccation at all levels between C2 and C7.   Cord: No significant abnormal spinal cord signal is observed.   Posterior  Fossa, vertebral arteries, paraspinal tissues: Unremarkable   Disc levels:   C2-3: Unremarkable.   C3-4: Borderline bilateral foraminal stenosis due to disc bulge and uncinate spurring.   C4-5: Moderate to prominent bilateral foraminal stenosis and borderline central narrowing of the thecal sac due to disc bulge and uncinate spurring.   C5-6: Moderate right and moderate to prominent left foraminal stenosis and borderline central narrowing of the thecal sac due to disc bulge and uncinate spurring.   C6-7: Mild right and moderate left foraminal stenosis due to disc bulge and uncinate spurring.   C7-T1: No impingement.  Minimal uncinate spurring.   T1-2: Unremarkable.   IMPRESSION: 1. Cervical spondylosis, degenerative disc disease, and mildly congenitally short pedicles contribute to moderate to prominent impingement at C4-5; moderate impingement at C5-6; and mild impingement at C6-7, as detailed above.     Electronically Signed   By: Ryan Salvage M.D.   On: 08/01/2020 09:25  ---------------------------------------------- EXAM: MRI LUMBAR SPINE WITHOUT CONTRAST   TECHNIQUE: Multiplanar, multisequence MR imaging of the lumbar spine was performed. No intravenous contrast was administered.   COMPARISON:  Lumbar spine x-rays dated June 23, 2021. CT abdomen pelvis dated March 26, 2020.   FINDINGS: Segmentation:  Standard.   Alignment:  Physiologic.   Vertebrae:  No fracture, evidence of discitis, or bone lesion.   Conus medullaris and cauda equina: Conus extends to the L1-L2 level. Conus and cauda equina appear normal.   Paraspinal and other soft tissues: Negative.   Disc levels:   T11-T12 to L2-L3: Negative.   L3-L4: Mild disc bulging and bilateral facet arthropathy. Mild bilateral neuroforaminal stenosis. No spinal canal stenosis.   L4-L5: Negative disc. Mild bilateral facet arthropathy. Mild bilateral neuroforaminal stenosis. No spinal canal  stenosis.   L5-S1: Negative disc. Mild bilateral facet arthropathy. No stenosis.   IMPRESSION: 1. Mild degenerative changes of the lower lumbar spine as described above. No high-grade stenosis or impingement.     Electronically Signed   By: Elsie ONEIDA Shoulder M.D.   On: 08/19/2021 16:36     Objective:  VS:  HT:    WT:   BMI:     BP:116/81  HR: bpm  TEMP: ( )  RESP:  Physical Exam Vitals and nursing note reviewed.  Constitutional:      General: She is not in acute distress.    Appearance: Normal appearance. She is not ill-appearing.  HENT:     Head: Normocephalic and atraumatic.     Right Ear: External ear normal.     Left Ear: External ear normal.  Eyes:     Extraocular Movements: Extraocular movements intact.  Cardiovascular:     Rate and Rhythm: Normal rate.     Pulses: Normal pulses.  Musculoskeletal:     Cervical back: Tenderness present. No rigidity.     Right lower leg: No edema.     Left lower leg: No edema.     Comments: Patient has good strength in the upper extremities including 5 out of 5 strength in wrist extension long finger flexion and APB.  There is no atrophy of the hands intrinsically.  There is a negative Hoffmann's test.   Lymphadenopathy:     Cervical: No cervical adenopathy.  Skin:    Findings: No erythema, lesion or rash.  Neurological:     General:  No focal deficit present.     Mental Status: She is alert and oriented to person, place, and time.     Sensory: No sensory deficit.     Motor: No weakness or abnormal muscle tone.     Coordination: Coordination normal.  Psychiatric:        Mood and Affect: Mood normal.        Behavior: Behavior normal.      Imaging: No results found.

## 2023-10-17 NOTE — Progress Notes (Signed)
 Pain Scale   Average Pain 10     Patient having pain in the left shoulder blade area.   +Driver, -BT, -Dye Allergies.

## 2023-10-17 NOTE — Patient Instructions (Signed)

## 2023-10-21 DIAGNOSIS — D171 Benign lipomatous neoplasm of skin and subcutaneous tissue of trunk: Secondary | ICD-10-CM

## 2023-10-21 NOTE — Procedures (Signed)
 Cervical Epidural Steroid Injection - Interlaminar Approach with Fluoroscopic Guidance  Patient: Katie Benson      Date of Birth: [birth year] MRN: [id number] PCP: Patient, No Pcp Per      Visit Date: 10/17/2023   Universal Protocol:    Date/Time: 08/24/257:24 PM  Consent Given By: the patient  Position: PRONE  Additional Comments: Vital signs were monitored before and after the procedure. Patient was prepped and draped in the usual sterile fashion. The correct patient, procedure, and site was verified.   Injection Procedure Details:   Procedure diagnoses: Cervical radiculopathy [M54.12]    Meds Administered:  Meds ordered this encounter  Medications   methylPREDNISolone  acetate (DEPO-MEDROL ) injection 40 mg     Laterality: Right  Location/Site: C7-T1  Needle: 3.5 in., 20 ga. Tuohy  Needle Placement: Paramedian epidural space  Findings:  -Comments: Excellent flow of contrast into the epidural space.  Procedure Details: Using a paramedian approach from the side mentioned above, the region overlying the inferior lamina was localized under fluoroscopic visualization and the soft tissues overlying this structure were infiltrated with 4 ml. of 1% Lidocaine  without Epinephrine . A # 20 gauge, Tuohy needle was inserted into the epidural space using a paramedian approach.  The epidural space was localized using loss of resistance along with contralateral oblique bi-planar fluoroscopic views.  After negative aspirate for air, blood, and CSF, a 2 ml. volume of Isovue -250 was injected into the epidural space and the flow of contrast was observed. Radiographs were obtained for documentation purposes.   The injectate was administered into the level noted above.  Additional Comments:  The patient tolerated the procedure well Dressing: 2 x 2 sterile gauze and Band-Aid    Post-procedure details: Patient was observed during the procedure. Post-procedure instructions were  reviewed.  Patient left the clinic in stable condition.

## 2023-10-23 ENCOUNTER — Other Ambulatory Visit: Payer: Self-pay

## 2023-10-27 ENCOUNTER — Encounter (HOSPITAL_COMMUNITY): Payer: Self-pay | Admitting: Emergency Medicine

## 2023-10-27 ENCOUNTER — Ambulatory Visit (HOSPITAL_COMMUNITY)
Admission: EM | Admit: 2023-10-27 | Discharge: 2023-10-27 | Disposition: A | Discharge status: Home / Self Care | Attending: Family Medicine | Admitting: Family Medicine

## 2023-10-27 DIAGNOSIS — T8149XA Infection following a procedure, other surgical site, initial encounter: Secondary | ICD-10-CM

## 2023-10-27 MED ORDER — KETOROLAC TROMETHAMINE 30 MG/ML IJ SOLN
30.0000 mg | Freq: Once | INTRAMUSCULAR | Status: AC
  Administered 2023-10-27: 30 mg via INTRAMUSCULAR

## 2023-10-27 MED ORDER — AMOXICILLIN-POT CLAVULANATE 875-125 MG PO TABS: 1.0000 | ORAL_TABLET | Freq: Two times a day (BID) | ORAL | 0 refills | Status: AC

## 2023-10-27 MED ORDER — MUPIROCIN 2 % EX OINT: 1.0000 | TOPICAL_OINTMENT | Freq: Two times a day (BID) | CUTANEOUS | 0 refills | Status: AC

## 2023-10-27 MED ORDER — KETOROLAC TROMETHAMINE 30 MG/ML IJ SOLN
INTRAMUSCULAR | Status: AC
  Filled 2023-10-27: qty 1

## 2023-10-27 MED ORDER — KETOROLAC TROMETHAMINE 10 MG PO TABS: 10.0000 mg | ORAL_TABLET | Freq: Four times a day (QID) | ORAL | 0 refills | Status: AC | PRN

## 2023-10-27 NOTE — ED Provider Notes (Signed)
 [facility]    CSN: [id number] Arrival date & time: 10/27/23  1627      History   Chief Complaint Chief Complaint  Patient presents with   Post-op Problem    HPI Katie Benson is a 43 y.o. female.   HPI Here for pain and some swelling of her postop wound and dehiscence of that wound.  On August 7 she had resection of a lipoma.  According to the op note she had subcu stitches placed and then Steri-Strips were placed over the skin.  Several of them and fallen off, but then earlier this week the top part of the incision opened up.  Her family applied some more Steri-Strips there.  But now more has opened up on the lower end.  No fever or chills.  There is increased tenderness in the area of the surgery.  NKDA  Last menstrual cycle was July 29   Past Medical History:  Diagnosis Date   Anemia    Arthritis    Fibroids    GERD (gastroesophageal reflux disease)    History of kidney stones    Neck injury     Patient Active Problem List   Diagnosis Date Noted   Lipoma of back 10/21/2023   History of esophageal ulcer 12/22/2021   Ingrown nail of fifth toe 12/22/2021   Chronic toe pain, left foot 12/22/2021   Blurry vision, bilateral 12/22/2021   Left corneal abrasion 12/22/2021   Osteoarthritis of left knee 12/14/2020   Subacromial bursitis of right shoulder joint 07/15/2020   Cervical radiculopathy 06/24/2020   Effusion, left knee 06/24/2019   Pes anserine bursitis 04/29/2019   Bucket handle tear of lateral meniscus of left knee 12/12/2018   Ganglion cyst 12/12/2018   Fibroids 03/18/2018   S/P parathyroidectomy 02/12/2018   Primary hyperparathyroidism (HCC) 01/08/2018   Hyperparathyroidism , secondary, non-renal (HCC) 01/08/2018   Fibroid uterus 12/06/2017   Hypercalcemia 12/06/2017   Fibroid 10/15/2017   Abnormal uterine bleeding (AUB) 08/20/2017   Anemia 08/20/2017    Past Surgical History:  Procedure Laterality Date   CESAREAN SECTION      EXCISION MASS, BACK Left 10/04/2023   Procedure: EXCISION MASS, BACK;  Surgeon: [name] Cordella Hamilton, MD;  Location: [facility];  Service: Orthopedics;  Laterality: Left;  left shoulder lipoma removal   IR ANGIOGRAM PELVIS SELECTIVE OR SUPRASELECTIVE  03/18/2018   IR ANGIOGRAM PELVIS SELECTIVE OR SUPRASELECTIVE  03/18/2018   IR ANGIOGRAM SELECTIVE EACH ADDITIONAL VESSEL  03/18/2018   IR ANGIOGRAM SELECTIVE EACH ADDITIONAL VESSEL  03/18/2018   IR EMBO TUMOR ORGAN ISCHEMIA INFARCT INC GUIDE ROADMAPPING  03/18/2018   IR RADIOLOGIST EVAL & MGMT  10/17/2017   IR RADIOLOGIST EVAL & MGMT  12/20/2017   IR RADIOLOGIST EVAL & MGMT  04/17/2018   IR US  GUIDE VASC ACCESS RIGHT  03/18/2018   KNEE ARTHROSCOPY WITH LATERAL MENISECTOMY Left 12/24/2018   Procedure: LEFT KNEE ARTHROSCOPY WITH LATERAL MENISECTOM, debriment of GANGLION CYST;  Surgeon: Anderson Maude ORN, MD;  Location: [facility];  Service: Orthopedics;  Laterality: Left;   PARATHYROIDECTOMY N/A 02/12/2018   Procedure: PARATHYROIDECTOMY;  Surgeon: Marolyn Nest, MD;  Location: [facility];  Service: General;  Laterality: N/A;   WISDOM TOOTH EXTRACTION     age 62    OB History     Gravida  2   Para  1   Term  1   Preterm  0   AB  1   Living  1  SAB  1   IAB  0   Ectopic  0   Multiple  0   Live Births  1            Home Medications    Prior to Admission medications   Medication Sig Start Date End Date Taking? Authorizing Provider  amoxicillin -clavulanate (AUGMENTIN ) 875-125 MG tablet Take 1 tablet by mouth 2 (two) times daily for 7 days. 10/27/23 11/03/23 Yes Vonna Sharlet POUR, MD  ketorolac  (TORADOL ) 10 MG tablet Take 1 tablet (10 mg total) by mouth every 6 (six) hours as needed (pain). 10/27/23  Yes Tyronza Happe K, MD  mupirocin  ointment (BACTROBAN ) 2 % Apply 1 Application topically 2 (two) times daily. To affected area till better 10/27/23  Yes Madi Bonfiglio, Sharlet POUR, MD  AMBULATORY NON FORMULARY MEDICATION  Medication Name:  Nitroglycerin  ointment 0.125% three times daily use pea sized amount per rectum for 6-8 weeks 12/15/20   Nandigam, Kavitha V, MD  cyclobenzaprine  (FLEXERIL ) 10 MG tablet Take 1 tablet (10 mg total) by mouth 3 (three) times daily. Do not take and operate motor vehicle. 09/11/23   Magnant, Charles L, PA-C  desipramine  (NORPRAMIN ) 50 MG tablet Take 1 tablet (50 mg total) by mouth at bedtime. MUST SCHEDULE OFFICE VISIT 03/01/23   [place], PA-C  diazepam  (VALIUM ) 5 MG tablet Take one tablet by mouth with light food one hour prior to procedure. Patient not taking: Reported on 09/27/2023 09/26/23   Williams, Megan E, NP  docusate sodium  (STOOL SOFTENER) 100 MG capsule Take 1 capsule (100 mg total) by mouth 2 (two) times daily. 03/01/23 02/24/24  [place], PA-C  gabapentin  (NEURONTIN ) 300 MG capsule Take 1 capsule (300 mg total) by mouth 3 (three) times daily. DO NOT TAKE AND OPERATE VEHICLE. 10/15/23   Magnant, Charles L, PA-C  hydrocortisone  (ANUSOL -HC) 2.5 % rectal cream Place 1 Application rectally 2 (two) times daily as needed for hemorrhoids or anal itching. 03/01/23   [place], PA-C  lidocaine  (LIDODERM ) 5 % Apply 2 patches topically daily as needed. Leave patch on for maximum duration of 12 hours.  Do not place 2 patches on one area within 24 hours 03/08/22     Lidocaine , Anorectal, (RECTICARE) 5 % CREA Apply 1 Application topically every 4 (four) hours as needed. 08/25/21   Jinny Carmine, MD  oxyCODONE  (ROXICODONE ) 5 MG immediate release tablet Take 1 tablet (5 mg total) by mouth every 8 (eight) hours as needed for severe pain (pain score 7-10). 10/12/23   Magnant, Charles L, PA-C  pantoprazole  (PROTONIX ) 40 MG tablet Take 1 tablet (40 mg total) by mouth daily. 03/01/23   [place], PA-C  Urea  (UREA  NAIL) 45 % GEL Apply 1 Application topically daily. 01/03/22   Gershon Donnice SAUNDERS, DPM  zolpidem  (AMBIEN ) 10 MG tablet Take 1 tablet (10mg ) by mouth an hour before procedure with light  food. 05/18/22   Trudy Duwaine BRAVO, NP    Family History Family History  Problem Relation Age of Onset   Diabetes Sister    Cancer Maternal Grandmother        ovarian   Hypercalcemia Mother    Other Father        HX Unknown    Social History Social History   Tobacco Use   Smoking status: Some Days    Current packs/day: 0.25    Average packs/day: 0.3 packs/day for 14.0 years (3.5 ttl pk-yrs)    Types: Cigarettes   Smokeless tobacco: Never  Vaping Use  Vaping status: Never Used  Substance Use Topics   Alcohol use: Yes    Comment: occassionally on weekends   Drug use: Yes    Types: Marijuana    Comment: multiple times a day     Allergies   Pork-derived products   Review of Systems Review of Systems   Physical Exam Triage Vital Signs ED Triage Vitals  Encounter Vitals Group     BP 10/27/23 1652 128/83     Girls Systolic BP Percentile --      Girls Diastolic BP Percentile --      Boys Systolic BP Percentile --      Boys Diastolic BP Percentile --      Pulse Rate 10/27/23 1652 95     Resp 10/27/23 1652 17     Temp 10/27/23 1652 98.3 F (36.8 C)     Temp Source 10/27/23 1652 Oral     SpO2 10/27/23 1652 99 %     Weight --      Height --      Head Circumference --      Peak Flow --      Pain Score 10/27/23 1650 10     Pain Loc --      Pain Education --      Exclude from Growth Chart --    No data found.  Updated Vital Signs BP 128/83 (BP Location: Right Arm)   Pulse 95   Temp 98.3 F (36.8 C) (Oral)   Resp 17   LMP 09/25/2023 (Approximate)   SpO2 99%   Visual Acuity Right Eye Distance:   Left Eye Distance:   Bilateral Distance:    Right Eye Near:   Left Eye Near:    Bilateral Near:     Physical Exam Vitals reviewed.  Constitutional:      General: She is not in acute distress.    Appearance: She is not toxic-appearing.  HENT:     Mouth/Throat:     Mouth: Mucous membranes are moist.  Eyes:     Extraocular Movements: Extraocular  movements intact.     Pupils: Pupils are equal, round, and reactive to light.  Skin:    Capillary Refill: Capillary refill takes less than 2 seconds.     Coloration: Skin is not pale.     Comments: There is a linear wound in the vertical distribution in her mid back in the interscapular area.  It is about 6 cm long.  There is eschar along it, but it is mildly dehisced in the very middle.  There is mild erythema along the wound on either side and there is some tenderness.  There is no fluctuance but there is some mild swelling of the skin around the wound.  Neurological:     General: No focal deficit present.     Mental Status: She is alert and oriented to person, place, and time.  Psychiatric:        Behavior: Behavior normal.      UC Treatments / Results  Labs (all labs ordered are listed, but only abnormal results are displayed) Labs Reviewed - No data to display  EKG   Radiology No results found.  Procedures Procedures (including critical care time)  Medications Ordered in UC Medications  ketorolac  (TORADOL ) 30 MG/ML injection 30 mg (30 mg Intramuscular Given 10/27/23 1729)    Initial Impression / Assessment and Plan / UC Course  I have reviewed the triage vital signs and the nursing notes.  Pertinent  labs & imaging results that were available during my care of the patient were reviewed by me and considered in my medical decision making (see chart for details).      I discussed with her that I think this is a wound infection that has caused the wound to dehisce.  Augmentin  is sent in along with mupirocin . She will contact her surgeons office when they are open this coming week. She is to go to the emergency room if she worsens in any way Final Clinical Impressions(s) / UC Diagnoses   Final diagnoses:  Postoperative wound infection     Discharge Instructions      Take amoxicillin -clavulanate 875 mg--1 tab twice daily with food for 7 days  You have been given  a shot of Toradol  30 mg today.  Ketorolac  10 mg tablets--take 1 tablet every 6 hours as needed for pain.  This is the same medicine that is in the shot we just gave you  Put mupirocin  ointment on the sore area twice daily until improved   Please call your surgeons office on Tuesday and they are open    ED Prescriptions     Medication Sig Dispense Auth. Provider   amoxicillin -clavulanate (AUGMENTIN ) 875-125 MG tablet Take 1 tablet by mouth 2 (two) times daily for 7 days. 14 tablet Brooks Kinnan, Sharlet POUR, MD   ketorolac  (TORADOL ) 10 MG tablet Take 1 tablet (10 mg total) by mouth every 6 (six) hours as needed (pain). 20 tablet Keldrick Pomplun K, MD   mupirocin  ointment (BACTROBAN ) 2 % Apply 1 Application topically 2 (two) times daily. To affected area till better 22 g Gaylin Osoria, Sharlet POUR, MD      I have reviewed the PDMP during this encounter.   Vonna Sharlet POUR, MD 10/27/23 [phone]

## 2023-10-27 NOTE — ED Triage Notes (Addendum)
 Pt had mass removed off her upper mid back earlier this month and told once the steri strips come off that would be fine. Pt reports incision came open today and causing lots of pain. Sister put some tape on area to try to keep incision closed.

## 2023-10-27 NOTE — Discharge Instructions (Addendum)
 Take amoxicillin -clavulanate 875 mg--1 tab twice daily with food for 7 days  You have been given a shot of Toradol  30 mg today.  Ketorolac  10 mg tablets--take 1 tablet every 6 hours as needed for pain.  This is the same medicine that is in the shot we just gave you  Put mupirocin  ointment on the sore area twice daily until improved   Please call your surgeons office on Tuesday and they are open

## 2023-11-01 ENCOUNTER — Other Ambulatory Visit: Payer: Self-pay

## 2023-11-01 MED ORDER — KETOROLAC TROMETHAMINE 10 MG PO TABS
10.0000 mg | ORAL_TABLET | Freq: Four times a day (QID) | ORAL | 0 refills | Status: AC | PRN
  Filled 2023-11-01: qty 20, 5d supply, fill #0

## 2023-11-08 ENCOUNTER — Other Ambulatory Visit: Payer: Self-pay

## 2023-11-13 ENCOUNTER — Other Ambulatory Visit: Payer: Self-pay

## 2023-11-14 ENCOUNTER — Other Ambulatory Visit: Payer: Self-pay

## 2023-11-15 ENCOUNTER — Ambulatory Visit (INDEPENDENT_AMBULATORY_CARE_PROVIDER_SITE_OTHER): Admitting: Surgical

## 2023-11-15 ENCOUNTER — Other Ambulatory Visit (INDEPENDENT_AMBULATORY_CARE_PROVIDER_SITE_OTHER): Payer: Self-pay

## 2023-11-15 ENCOUNTER — Other Ambulatory Visit: Payer: Self-pay

## 2023-11-15 DIAGNOSIS — M545 Low back pain, unspecified: Secondary | ICD-10-CM | POA: Diagnosis not present

## 2023-11-15 MED ORDER — DOXYCYCLINE HYCLATE 100 MG PO CAPS
100.0000 mg | ORAL_CAPSULE | Freq: Two times a day (BID) | ORAL | 0 refills | Status: AC
  Filled 2023-11-15: qty 20, 10d supply, fill #0

## 2023-11-17 ENCOUNTER — Encounter: Payer: Self-pay | Admitting: Surgical

## 2023-11-17 NOTE — Progress Notes (Signed)
 Post-Op Visit Note   Patient: Katie Benson           Date of Birth: [birth year]           MRN: [id number] Visit Date: 11/15/2023 PCP: Patient, No Pcp Per   Assessment & Plan:  Chief Complaint:  Chief Complaint  Patient presents with   Middle Back - Routine Post Op   Visit Diagnoses:  1. Acute bilateral low back pain, unspecified whether sciatica present     Plan: Patient is a 43 year old female who presents s/p excision of lipoma from scapular region on 10/04/2023.  Pain around her incision has been improving.  She did have to go to urgent care several weeks ago because she had some superficial dehiscence of her incision.  She was prescribed antibiotics and was not able to complete the course of those antibiotics due to having some side effects such as lip swelling from these.  She denies any fevers or chills.  On exam, incision looks to be well-healed in the proximal and distal aspects of the incision but the midportion of the incision is still gapping superficially.  There is a small amount of suture material that was removed from this wound bed.  There is no expressible or purulent drainage or active drainage.  Really no erythema around the incision.  It is less tender by far compared with last exam.  Her neck range of motion is improved.  The incision was probed but it is not deep enough to pack.  Plan at this time is doxycycline  for prophylaxis against any wound infection since she was not able to complete her prior course of antibiotics and due to her recent healthcare exposure both from her surgery and urgent care visit.  Will plan to see her back in 1 week for clinical recheck regarding her incision to make sure it is progressing in the right direction.  She also complains of right low back pain today that goes from her right sided low back across the back into the right buttock.  She has been having symptoms for about 2 to 3 days without any history of injury that she can  recall.  She has no radicular pain past the buttock.  She denies any numbness or tingling.  She has really no significant difficulty putting pressure on her buttock but she is finding it difficult to stand up straight.  She has intact hip flexion, quadricep, hamstring, dorsiflexion, plantarflexion strength rated 5/5 bilaterally.  No clonus noted bilaterally.  She has negative straight leg raise bilaterally.  Impression based on prior MRI of her lumbar spine and today's radiographs is likely lumbar back muscular strain.  We will see how she is doing in 1 week but there is no need for further imaging at this time.  Follow-Up Instructions: No follow-ups on file.   Orders:  Orders Placed This Encounter  Procedures   XR Lumbar Spine 2-3 Views   Meds ordered this encounter  Medications   doxycycline  (VIBRAMYCIN ) 100 MG capsule    Sig: Take 1 capsule (100 mg total) by mouth 2 (two) times daily.    Dispense:  20 capsule    Refill:  0    Imaging: No results found.  PMFS History: Patient Active Problem List   Diagnosis Date Noted   Lipoma of back 10/21/2023   History of esophageal ulcer 12/22/2021   Ingrown nail of fifth toe 12/22/2021   Chronic toe pain, left foot 12/22/2021   Blurry vision, bilateral  12/22/2021   Left corneal abrasion 12/22/2021   Osteoarthritis of left knee 12/14/2020   Subacromial bursitis of right shoulder joint 07/15/2020   Cervical radiculopathy 06/24/2020   Effusion, left knee 06/24/2019   Pes anserine bursitis 04/29/2019   Bucket handle tear of lateral meniscus of left knee 12/12/2018   Ganglion cyst 12/12/2018   Fibroids 03/18/2018   S/P parathyroidectomy 02/12/2018   Primary hyperparathyroidism (HCC) 01/08/2018   Hyperparathyroidism , secondary, non-renal (HCC) 01/08/2018   Fibroid uterus 12/06/2017   Hypercalcemia 12/06/2017   Fibroid 10/15/2017   Abnormal uterine bleeding (AUB) 08/20/2017   Anemia 08/20/2017   Past Medical History:  Diagnosis Date    Anemia    Arthritis    Fibroids    GERD (gastroesophageal reflux disease)    History of kidney stones    Neck injury     Family History  Problem Relation Age of Onset   Diabetes Sister    Cancer Maternal Grandmother        ovarian   Hypercalcemia Mother    Other Father        HX Unknown    Past Surgical History:  Procedure Laterality Date   CESAREAN SECTION     EXCISION MASS, BACK Left 10/04/2023   Procedure: EXCISION MASS, BACK;  Surgeon: [name] Cordella Hamilton, MD;  Location: [facility];  Service: Orthopedics;  Laterality: Left;  left shoulder lipoma removal   IR ANGIOGRAM PELVIS SELECTIVE OR SUPRASELECTIVE  03/18/2018   IR ANGIOGRAM PELVIS SELECTIVE OR SUPRASELECTIVE  03/18/2018   IR ANGIOGRAM SELECTIVE EACH ADDITIONAL VESSEL  03/18/2018   IR ANGIOGRAM SELECTIVE EACH ADDITIONAL VESSEL  03/18/2018   IR EMBO TUMOR ORGAN ISCHEMIA INFARCT INC GUIDE ROADMAPPING  03/18/2018   IR RADIOLOGIST EVAL & MGMT  10/17/2017   IR RADIOLOGIST EVAL & MGMT  12/20/2017   IR RADIOLOGIST EVAL & MGMT  04/17/2018   IR US  GUIDE VASC ACCESS RIGHT  03/18/2018   KNEE ARTHROSCOPY WITH LATERAL MENISECTOMY Left 12/24/2018   Procedure: LEFT KNEE ARTHROSCOPY WITH LATERAL MENISECTOM, debriment of GANGLION CYST;  Surgeon: Anderson Maude ORN, MD;  Location: [facility];  Service: Orthopedics;  Laterality: Left;   PARATHYROIDECTOMY N/A 02/12/2018   Procedure: PARATHYROIDECTOMY;  Surgeon: Marolyn Nest, MD;  Location: [facility];  Service: General;  Laterality: N/A;   WISDOM TOOTH EXTRACTION     age 14   Social History   Occupational History   Occupation: unemployed  Tobacco Use   Smoking status: Some Days    Current packs/day: 0.25    Average packs/day: 0.3 packs/day for 14.0 years (3.5 ttl pk-yrs)    Types: Cigarettes   Smokeless tobacco: Never  Vaping Use   Vaping status: Never Used  Substance and Sexual Activity   Alcohol use: Yes    Comment: occassionally on weekends   Drug use: Yes    Types:  Marijuana    Comment: multiple times a day   Sexual activity: Yes    Partners: Male    Birth control/protection: Condom

## 2023-11-23 ENCOUNTER — Ambulatory Visit: Admitting: Surgical

## 2023-11-23 DIAGNOSIS — M1712 Unilateral primary osteoarthritis, left knee: Secondary | ICD-10-CM

## 2023-11-23 DIAGNOSIS — D171 Benign lipomatous neoplasm of skin and subcutaneous tissue of trunk: Secondary | ICD-10-CM

## 2023-11-24 ENCOUNTER — Encounter: Payer: Self-pay | Admitting: Surgical

## 2023-11-24 MED ORDER — LIDOCAINE HCL 1 % IJ SOLN
5.0000 mL | INTRAMUSCULAR | Status: AC | PRN
  Administered 2023-11-23: 5 mL

## 2023-11-24 MED ORDER — BUPIVACAINE HCL 0.25 % IJ SOLN
4.0000 mL | INTRAMUSCULAR | Status: AC | PRN
  Administered 2023-11-23: 4 mL via INTRA_ARTICULAR

## 2023-11-24 MED ORDER — TRIAMCINOLONE ACETONIDE 40 MG/ML IJ SUSP
40.0000 mg | INTRAMUSCULAR | Status: AC | PRN
  Administered 2023-11-23: 40 mg via INTRA_ARTICULAR

## 2023-11-24 NOTE — Progress Notes (Signed)
 Post-Op Visit Note   Patient: Katie Benson           Date of Birth: Jul 13, 1980           MRN: [id number] Visit Date: 11/23/2023 PCP: Patient, No Pcp Per   Assessment & Plan:  Chief Complaint:  Chief Complaint  Patient presents with   Other    Follow up for incision check   Visit Diagnoses: No diagnosis found.  Plan: Patient is a 43 year old female who returns for reevaluation of incision following last exam where she had Vicryl suture keeping the incision from closing.  There were multiple gaps on last examination and today the incision is pretty much healed skin edge to skin edge with a little bit of very superficial dehiscence and 1 or 2 spots that overall looks substantially improved compared with prior exam.  There is no expressible drainage or fluctuance.  There is really no significant erythema or induration or any evidence of true infection.  She has been taking doxycycline  and has been overall compliant with taking it aside from a couple missed doses.  Still has some days left.    She is also here for left knee injection.  Has history of left knee arthritis.  Has small effusion today which is actually less than it typically is.  Aspirated about 10 cc of nonpurulent synovial fluid.  Injected knee with cortisone and she tolerated procedure well.  Follow-up in about 3 weeks for final check on her incision.    Procedure Note  Patient: Katie Benson             Date of Birth: Dec 07, 1980           MRN: [id number]             Visit Date: 11/23/2023  Procedures: Visit Diagnoses: No diagnosis found.  Large Joint Inj: L knee on 11/23/2023 12:25 PM Indications: diagnostic evaluation, joint swelling and pain Details: 18 G 1.5 in needle, superolateral approach  Arthrogram: No  Medications: 5 mL lidocaine  1 %; 4 mL bupivacaine  0.25 %; 40 mg triamcinolone  acetonide 40 MG/ML Aspirate: 10 mL Outcome: tolerated well, no immediate complications Procedure, treatment  alternatives, risks and benefits explained, specific risks discussed. Consent was given by the patient. Immediately prior to procedure a time out was called to verify the correct patient, procedure, equipment, support staff and site/side marked as required. Patient was prepped and draped in the usual sterile fashion.         Follow-Up Instructions: No follow-ups on file.   Orders:  No orders of the defined types were placed in this encounter.  No orders of the defined types were placed in this encounter.   Imaging: No results found.  PMFS History: Patient Active Problem List   Diagnosis Date Noted   Lipoma of back 10/21/2023   History of esophageal ulcer 12/22/2021   Ingrown nail of fifth toe 12/22/2021   Chronic toe pain, left foot 12/22/2021   Blurry vision, bilateral 12/22/2021   Left corneal abrasion 12/22/2021   Osteoarthritis of left knee 12/14/2020   Subacromial bursitis of right shoulder joint 07/15/2020   Cervical radiculopathy 06/24/2020   Effusion, left knee 06/24/2019   Pes anserine bursitis 04/29/2019   Bucket handle tear of lateral meniscus of left knee 12/12/2018   Ganglion cyst 12/12/2018   Fibroids 03/18/2018   S/P parathyroidectomy 02/12/2018   Primary hyperparathyroidism 01/08/2018   Hyperparathyroidism , secondary, non-renal 01/08/2018   Fibroid uterus 12/06/2017  Hypercalcemia 12/06/2017   Fibroid 10/15/2017   Abnormal uterine bleeding (AUB) 08/20/2017   Anemia 08/20/2017   Past Medical History:  Diagnosis Date   Anemia    Arthritis    Fibroids    GERD (gastroesophageal reflux disease)    History of kidney stones    Neck injury     Family History  Problem Relation Age of Onset   Diabetes Sister    Cancer Maternal Grandmother        ovarian   Hypercalcemia Mother    Other Father        HX Unknown    Past Surgical History:  Procedure Laterality Date   CESAREAN SECTION     EXCISION MASS, BACK Left 10/04/2023   Procedure: EXCISION  MASS, BACK;  Surgeon: [name] Cordella Hamilton, MD;  Location: [facility];  Service: Orthopedics;  Laterality: Left;  left shoulder lipoma removal   IR ANGIOGRAM PELVIS SELECTIVE OR SUPRASELECTIVE  03/18/2018   IR ANGIOGRAM PELVIS SELECTIVE OR SUPRASELECTIVE  03/18/2018   IR ANGIOGRAM SELECTIVE EACH ADDITIONAL VESSEL  03/18/2018   IR ANGIOGRAM SELECTIVE EACH ADDITIONAL VESSEL  03/18/2018   IR EMBO TUMOR ORGAN ISCHEMIA INFARCT INC GUIDE ROADMAPPING  03/18/2018   IR RADIOLOGIST EVAL & MGMT  10/17/2017   IR RADIOLOGIST EVAL & MGMT  12/20/2017   IR RADIOLOGIST EVAL & MGMT  04/17/2018   IR US  GUIDE VASC ACCESS RIGHT  03/18/2018   KNEE ARTHROSCOPY WITH LATERAL MENISECTOMY Left 12/24/2018   Procedure: LEFT KNEE ARTHROSCOPY WITH LATERAL MENISECTOM, debriment of GANGLION CYST;  Surgeon: Anderson Maude ORN, MD;  Location: [facility];  Service: Orthopedics;  Laterality: Left;   PARATHYROIDECTOMY N/A 02/12/2018   Procedure: PARATHYROIDECTOMY;  Surgeon: Marolyn Nest, MD;  Location: [facility];  Service: General;  Laterality: N/A;   WISDOM TOOTH EXTRACTION     age 63   Social History   Occupational History   Occupation: unemployed  Tobacco Use   Smoking status: Some Days    Current packs/day: 0.25    Average packs/day: 0.3 packs/day for 14.0 years (3.5 ttl pk-yrs)    Types: Cigarettes   Smokeless tobacco: Never  Vaping Use   Vaping status: Never Used  Substance and Sexual Activity   Alcohol use: Yes    Comment: occassionally on weekends   Drug use: Yes    Types: Marijuana    Comment: multiple times a day   Sexual activity: Yes    Partners: Male    Birth control/protection: Condom

## 2023-12-13 ENCOUNTER — Other Ambulatory Visit: Payer: Self-pay | Admitting: Surgical

## 2023-12-13 ENCOUNTER — Other Ambulatory Visit: Payer: Self-pay

## 2023-12-13 MED ORDER — GABAPENTIN 300 MG PO CAPS
300.0000 mg | ORAL_CAPSULE | Freq: Three times a day (TID) | ORAL | 1 refills | Status: DC
  Filled 2023-12-13: qty 90, 30d supply, fill #0
  Filled 2024-01-29: qty 90, 30d supply, fill #1

## 2023-12-14 ENCOUNTER — Ambulatory Visit: Admitting: Surgical

## 2023-12-16 NOTE — Telephone Encounter (Signed)
 Okay to reschedule within the next 1 to 2 weeks.  Her incision was looking okay at the last appointment so I do not think it needs to be urgent but we just need a final check to make sure that it looks okay.  Alternatively, if she is doing just fine she could take a picture and send it to us  but it is probably easier for us  to just evaluated in person.

## 2023-12-24 ENCOUNTER — Other Ambulatory Visit: Payer: Self-pay

## 2023-12-24 ENCOUNTER — Ambulatory Visit (INDEPENDENT_AMBULATORY_CARE_PROVIDER_SITE_OTHER): Admitting: Surgical

## 2023-12-24 ENCOUNTER — Encounter: Payer: Self-pay | Admitting: Surgical

## 2023-12-24 DIAGNOSIS — D171 Benign lipomatous neoplasm of skin and subcutaneous tissue of trunk: Secondary | ICD-10-CM

## 2023-12-24 MED ORDER — CYCLOBENZAPRINE HCL 10 MG PO TABS
10.0000 mg | ORAL_TABLET | Freq: Three times a day (TID) | ORAL | 1 refills | Status: DC
  Filled 2023-12-24: qty 90, 30d supply, fill #0
  Filled 2024-01-29: qty 90, 30d supply, fill #1

## 2023-12-24 NOTE — Progress Notes (Signed)
 Post-Op Visit Note   Patient: Katie Benson           Date of Birth: 1981-01-29           MRN: [id number] Visit Date: 12/24/2023 PCP: Patient, No Pcp Per   Assessment & Plan:  Chief Complaint:  Chief Complaint  Patient presents with   Middle Back - Routine Post Op, Follow-up    10/04/2023 Excision mass, back   Visit Diagnoses:  1. Lipoma of back     Plan: Patient is a 43 year old female who presents s/p excision of lipoma from upper back on 10/04/2023.  Here today for incision recheck primarily.  Incision is well-healed without evidence of infection or dehiscence.  Patient does complain of slightly increased pain in the shoulder blade region along with muscle spasms in this area.  She has difficulty sleeping.  Still very sensitive to touch in this area and she has pain that bothers her in the upper trapezius that extends into the lower trap region along with some pain in the latissimus area.  Flexeril  helps.  Gabapentin  helps.  On exam, patient has incision that is well-healed.  Intact shrugging of the shoulder.  Excellent rotator cuff strength of the left arm.  She has some weakness of lower trapezius musculature.  This seems secondary to pain.  Intact EPL, FPL, finger abduction of the left arm.  Plan at this time is refill Flexeril .  We will have her start physical therapy on [facility] for scapular mobility and lower trapezius strengthening exercises.  See her back in 6 weeks for clinical recheck.  Follow-Up Instructions: No follow-ups on file.   Orders:  Orders Placed This Encounter  Procedures   Ambulatory referral to Physical Therapy   Meds ordered this encounter  Medications   cyclobenzaprine  (FLEXERIL ) 10 MG tablet    Sig: Take 1 tablet (10 mg total) by mouth 3 (three) times daily. Do not take and operate motor vehicle.    Dispense:  90 tablet    Refill:  1    Imaging: No results found.  PMFS History: Patient Active Problem List   Diagnosis Date Noted    Lipoma of back 10/21/2023   History of esophageal ulcer 12/22/2021   Ingrown nail of fifth toe 12/22/2021   Chronic toe pain, left foot 12/22/2021   Blurry vision, bilateral 12/22/2021   Left corneal abrasion 12/22/2021   Osteoarthritis of left knee 12/14/2020   Subacromial bursitis of right shoulder joint 07/15/2020   Cervical radiculopathy 06/24/2020   Effusion, left knee 06/24/2019   Pes anserine bursitis 04/29/2019   Bucket handle tear of lateral meniscus of left knee 12/12/2018   Ganglion cyst 12/12/2018   Fibroids 03/18/2018   S/P parathyroidectomy 02/12/2018   Primary hyperparathyroidism 01/08/2018   Hyperparathyroidism , secondary, non-renal 01/08/2018   Fibroid uterus 12/06/2017   Hypercalcemia 12/06/2017   Fibroid 10/15/2017   Abnormal uterine bleeding (AUB) 08/20/2017   Anemia 08/20/2017   Past Medical History:  Diagnosis Date   Anemia    Arthritis    Fibroids    GERD (gastroesophageal reflux disease)    History of kidney stones    Neck injury     Family History  Problem Relation Age of Onset   Diabetes Sister    Cancer Maternal Grandmother        ovarian   Hypercalcemia Mother    Other Father        HX Unknown    Past Surgical History:  Procedure Laterality  Date   CESAREAN SECTION     EXCISION MASS, BACK Left 10/04/2023   Procedure: EXCISION MASS, BACK;  Surgeon: [name] Cordella Hamilton, MD;  Location: [facility];  Service: Orthopedics;  Laterality: Left;  left shoulder lipoma removal   IR ANGIOGRAM PELVIS SELECTIVE OR SUPRASELECTIVE  03/18/2018   IR ANGIOGRAM PELVIS SELECTIVE OR SUPRASELECTIVE  03/18/2018   IR ANGIOGRAM SELECTIVE EACH ADDITIONAL VESSEL  03/18/2018   IR ANGIOGRAM SELECTIVE EACH ADDITIONAL VESSEL  03/18/2018   IR EMBO TUMOR ORGAN ISCHEMIA INFARCT INC GUIDE ROADMAPPING  03/18/2018   IR RADIOLOGIST EVAL & MGMT  10/17/2017   IR RADIOLOGIST EVAL & MGMT  12/20/2017   IR RADIOLOGIST EVAL & MGMT  04/17/2018   IR US  GUIDE VASC ACCESS  RIGHT  03/18/2018   KNEE ARTHROSCOPY WITH LATERAL MENISECTOMY Left 12/24/2018   Procedure: LEFT KNEE ARTHROSCOPY WITH LATERAL MENISECTOM, debriment of GANGLION CYST;  Surgeon: Anderson Maude ORN, MD;  Location: [facility];  Service: Orthopedics;  Laterality: Left;   PARATHYROIDECTOMY N/A 02/12/2018   Procedure: PARATHYROIDECTOMY;  Surgeon: Marolyn Nest, MD;  Location: [facility];  Service: General;  Laterality: N/A;   WISDOM TOOTH EXTRACTION     age 84   Social History   Occupational History   Occupation: unemployed  Tobacco Use   Smoking status: Some Days    Current packs/day: 0.25    Average packs/day: 0.3 packs/day for 14.0 years (3.5 ttl pk-yrs)    Types: Cigarettes   Smokeless tobacco: Never  Vaping Use   Vaping status: Never Used  Substance and Sexual Activity   Alcohol use: Yes    Comment: occassionally on weekends   Drug use: Yes    Types: Marijuana    Comment: multiple times a day   Sexual activity: Yes    Partners: Male    Birth control/protection: Condom

## 2023-12-31 ENCOUNTER — Encounter: Payer: Self-pay | Admitting: Radiology

## 2024-01-28 NOTE — Therapy (Signed)
 OUTPATIENT PHYSICAL THERAPY UPPER EXTREMITY EVALUATION   Patient Name: Katie Benson MRN: [id number] DOB:07-Jun-1980, 43 y.o., female Today's Date: 01/29/2024  END OF SESSION:  PT End of Session - 01/29/24 1157     Visit Number 1    Number of Visits 5    Date for Recertification  03/06/24    Authorization Type [place] MEDICAID HE[employer] BLUE    PT Start Time 1156    PT Stop Time 1239    PT Time Calculation (min) 43 min    Activity Tolerance Patient tolerated treatment well    Behavior During Therapy WFL for tasks assessed/performed          Past Medical History:  Diagnosis Date   Anemia    Arthritis    Fibroids    GERD (gastroesophageal reflux disease)    History of kidney stones    Neck injury    Past Surgical History:  Procedure Laterality Date   CESAREAN SECTION     EXCISION MASS, BACK Left 10/04/2023   Procedure: EXCISION MASS, BACK;  Surgeon: [name] Cordella Hamilton, MD;  Location: [facility];  Service: Orthopedics;  Laterality: Left;  left shoulder lipoma removal   IR ANGIOGRAM PELVIS SELECTIVE OR SUPRASELECTIVE  03/18/2018   IR ANGIOGRAM PELVIS SELECTIVE OR SUPRASELECTIVE  03/18/2018   IR ANGIOGRAM SELECTIVE EACH ADDITIONAL VESSEL  03/18/2018   IR ANGIOGRAM SELECTIVE EACH ADDITIONAL VESSEL  03/18/2018   IR EMBO TUMOR ORGAN ISCHEMIA INFARCT INC GUIDE ROADMAPPING  03/18/2018   IR RADIOLOGIST EVAL & MGMT  10/17/2017   IR RADIOLOGIST EVAL & MGMT  12/20/2017   IR RADIOLOGIST EVAL & MGMT  04/17/2018   IR US  GUIDE VASC ACCESS RIGHT  03/18/2018   KNEE ARTHROSCOPY WITH LATERAL MENISECTOMY Left 12/24/2018   Procedure: LEFT KNEE ARTHROSCOPY WITH LATERAL MENISECTOM, debriment of GANGLION CYST;  Surgeon: Anderson Maude ORN, MD;  Location: [facility];  Service: Orthopedics;  Laterality: Left;   PARATHYROIDECTOMY N/A 02/12/2018   Procedure: PARATHYROIDECTOMY;  Surgeon: Marolyn Nest, MD;  Location: [facility];  Service: General;  Laterality: N/A;   WISDOM TOOTH EXTRACTION      age 9   Patient Active Problem List   Diagnosis Date Noted   Lipoma of back 10/21/2023   History of esophageal ulcer 12/22/2021   Ingrown nail of fifth toe 12/22/2021   Chronic toe pain, left foot 12/22/2021   Blurry vision, bilateral 12/22/2021   Left corneal abrasion 12/22/2021   Osteoarthritis of left knee 12/14/2020   Subacromial bursitis of right shoulder joint 07/15/2020   Cervical radiculopathy 06/24/2020   Effusion, left knee 06/24/2019   Pes anserine bursitis 04/29/2019   Bucket handle tear of lateral meniscus of left knee 12/12/2018   Ganglion cyst 12/12/2018   Fibroids 03/18/2018   S/P parathyroidectomy 02/12/2018   Primary hyperparathyroidism 01/08/2018   Hyperparathyroidism , secondary, non-renal 01/08/2018   Fibroid uterus 12/06/2017   Hypercalcemia 12/06/2017   Fibroid 10/15/2017   Abnormal uterine bleeding (AUB) 08/20/2017   Anemia 08/20/2017    PCP: No PCP  REFERRING PROVIDER: Shirly Carlin CROME, PA-C   REFERRING DIAG: D17.1 (ICD-10-CM) - Lipoma of back   THERAPY DIAG:  Pain in thoracic spine  Muscle weakness (generalized)  Rationale for Evaluation and Treatment: Rehabilitation  ONSET DATE: 10/04/23 Sx  SUBJECTIVE:  SUBJECTIVE STATEMENT: Pt reports she is experiencing L mid back pain following Sx to remove a lipoma on 10/04/23. Prior to surgery, she notes having pain in this area esp when her back pressed into the bed or back of a chair.  She states being told the surgery was more extensive than expected do to the lipoma's size and being interconnected to the muscle.  Hand dominance: Right  PERTINENT HISTORY: Pt reports cervical disc issue c a pinch nerve down the R UE and L knee pain  PN note from Charles L. Magnant PA-C 12/24/23 Plan: Patient is a 43 year old female  who presents s/p excision of lipoma from upper back on 10/04/2023.  Here today for incision recheck primarily.  Incision is well-healed without evidence of infection or dehiscence.  Patient does complain of slightly increased pain in the shoulder blade region along with muscle spasms in this area.  She has difficulty sleeping.  Still very sensitive to touch in this area and she has pain that bothers her in the upper trapezius that extends into the lower trap region along with some pain in the latissimus area.  Flexeril  helps.  Gabapentin  helps.   On exam, patient has incision that is well-healed.  Intact shrugging of the shoulder.  Excellent rotator cuff strength of the left arm.  She has some weakness of lower trapezius musculature.  This seems secondary to pain.  Intact EPL, FPL, finger abduction of the left arm.   Plan at this time is refill Flexeril .  We will have her start physical therapy on [facility] for scapular mobility and lower trapezius strengthening exercises.  See her back in 6 weeks for clinical recheck.    PAIN:  Are you having pain? Yes: NPRS scale: 7-10/10 Pain location: L interscapular/mid back area Pain description: throb, tight, spasms, sharp Aggravating factors: The use of the L upper quarter- symptoms increase c actively; sleep Relieving factors: Relaxing, heating pad  PRECAUTIONS: None  RED FLAGS: None   WEIGHT BEARING RESTRICTIONS: No  FALLS:  Has patient fallen in last 6 months? 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  DIAGNOSTIC FINDINGS:  See Epic and MD notes  PATIENT SURVEYS :  Quick Dash: 75%  disability  Minimally Clinically Important Difference (MCID): 15-20 points  COGNITION: Overall cognitive status: Within functional limits for tasks assessed     SENSATION: Not tested  POSTURE: Forward head and shoulders  UPPER EXTREMITY ROM:   L shoulder AROMs were equal to the R. With all movements pt reported increased pain and pt utilized irregular trunk movements to complete. Active ROM Right eval Left eval  Shoulder flexion 130 130  Shoulder extension    Shoulder abduction    Shoulder adduction    Shoulder internal rotation T11 T11  Shoulder external rotation T4 T4  Elbow flexion    Elbow extension    Wrist flexion    Wrist extension    Wrist ulnar deviation    Wrist radial deviation    Wrist pronation    Wrist supination    (Blank rows = not tested)  UPPER EXTREMITY MMT:  MMT Right eval Left eval  Shoulder flexion 4+ 4+ p  Shoulder extension  Shoulder abduction 4+ 4+ p  Shoulder adduction    Shoulder internal rotation 5 5 p  Shoulder external rotation 5 5 p  Middle trapezius 4 3 p  Lower trapezius 4 3 p  Elbow flexion 5 5  Elbow extension 5 5  Wrist flexion    Wrist extension    Wrist ulnar deviation    Wrist radial deviation    Wrist pronation    Wrist supination    Grip strength (lbs) 83,85=84 68, 70=69  (Blank rows = not tested)  SHOULDER SPECIAL TESTS: Impingement tests: NT SLAP lesions: NT Instability tests: NT Rotator cuff assessment: Drop arm test: negative and Full can test: negative Biceps assessment: NT  PALPATION:  Significantly TTP to the L interscapula area                                                                                                                  TREATMENT DATE:  [facility] Adult PT Treatment:                                                DATE: 01/29/24 Therapeutic Exercise: Developed, instructed in, and pt completed therex as noted in HEP  Self Care: Continue using of heating pad for symptom management    PATIENT EDUCATION: Education details: Eval findings, POC, HEP, self care  Person educated: Patient Education method: Explanation, Demonstration, Tactile cues, Verbal cues, and Handouts Education comprehension: verbalized understanding, returned demonstration, verbal cues required, and tactile cues required  HOME EXERCISE PROGRAM: Access Code: [email or web address] URL: [email or web address] Date: 01/29/2024 Prepared by: Dasie Daft  Exercises - Standing Shoulder Posterior Capsule Stretch  - 2 x daily - 7 x weekly - 1 sets - 3 reps - 30 hold - Seated Upper Trapezius Stretch (Mirrored)  - 2 x daily - 7 x weekly - 1 sets - 3 reps - 30 hold - Standing Shoulder Row with Anchored Resistance  - 1 x daily - 7 x weekly - 2 sets - 10 reps - 3 hold - Shoulder extension with resistance - Neutral  - 1 x daily - 7 x weekly - 2 sets - 10 reps - 3 hold  ASSESSMENT:  CLINICAL IMPRESSION: Patient is a 44 y.o. female who was seen today for physical therapy evaluation and treatment for D17.1 (ICD-10-CM) - Lipoma of back. 1 x week x 4 weeks, Scapular ROM and lower trap strength. Pt presents with significant L interscapular mid back pain and moderate L mid and lower scapula weakness. Otherwise, the AROM and strength of the L shoulder were found equal to the R. With AROMs of the L shoulder, pt demonstrated irregular trunk movements to complete the L arm motions. The trunk compensations appeared related to pain vs strength. Pt's Quick Dash self functional assessment indicates she perceives she is experiencing a high level of disability with her L shoulder girdle. A HEP was  started. Pt will benefit from skilled PT 1w4 to address impairments to optimize function with less pain.    OBJECTIVE IMPAIRMENTS: decreased activity tolerance, decreased strength, increased muscle spasms, and pain.   ACTIVITY LIMITATIONS: carrying, lifting, bathing, toileting, dressing, reach over head, hygiene/grooming, and caring for  others  PARTICIPATION LIMITATIONS: meal prep, cleaning, laundry, community activity, and occupation  PERSONAL FACTORS: Past/current experiences, Social background, and 1 comorbidity: cervical disc issue c a pinched nerve down R UE are also affecting patient's functional outcome.   REHAB POTENTIAL: Good  CLINICAL DECISION MAKING: Evolving/moderate complexity  EVALUATION COMPLEXITY: Moderate  GOALS:  SHORT TERM GOALS: Target date: 02/22/24  Pt will be Ind in an initial HEP  Baseline:started Goal status: INITIAL  2.  Pt will report 25% or greater improvement in pain for improved function and QOL Baseline: 7-10/10 Goal status: INITIAL  LONG TERM GOALS: Target date: 03/06/24  Pt will be Ind in a final HEP to maintain achieved LOF  Baseline: started Goal status: INITIAL  2.  Pt will report 50% or greater improvement in pain for improved function and QOL Baseline: 7-10/20 Goal status: INITIAL  3.  Increase mid and lower scapula strength for improved function and quality of mobility Baseline: 3/5 with pain Goal status: INITIAL  4.  Pt will be able to left 3# for improved function with household activities Baseline: NT Goal status: INITIAL  5.  Pt's Quick Dash score will improve by the MCID to 60% as indication of improved function  Baseline:75% disability  Goal status: INITIAL  PLAN: PT FREQUENCY: 1x/week  PT DURATION: 4 weeks  PLANNED INTERVENTIONS: [id number]- PT Re-evaluation, 97110-Therapeutic exercises, 97530- Therapeutic activity, 97112- Neuromuscular re-education, 97535- Self Care, [id number]- Manual therapy, [id number]- Aquatic Therapy, [id number]- Electrical stimulation (unattended), Patient/Family education, Taping, Joint mobilization, Cryotherapy, and Moist heat  PLAN FOR NEXT SESSION: Assess response to HEP; progress therex as indicated; use of modalities, and manual therapy as indicated.   Ardean Melroy MS, PT 01/29/24 6:15 PM   For all possible CPT codes, reference the Planned  Interventions line above.     Check all conditions that are expected to impact treatment: {Conditions expected to impact treatment:Musculoskeletal disorders   If treatment provided at initial evaluation, no treatment charged due to lack of authorization.

## 2024-01-29 ENCOUNTER — Other Ambulatory Visit: Payer: Self-pay

## 2024-01-29 ENCOUNTER — Ambulatory Visit: Attending: Surgical

## 2024-01-29 DIAGNOSIS — M62830 Muscle spasm of back: Secondary | ICD-10-CM | POA: Insufficient documentation

## 2024-01-29 DIAGNOSIS — M6281 Muscle weakness (generalized): Secondary | ICD-10-CM | POA: Diagnosis present

## 2024-01-29 DIAGNOSIS — D171 Benign lipomatous neoplasm of skin and subcutaneous tissue of trunk: Secondary | ICD-10-CM | POA: Insufficient documentation

## 2024-01-29 DIAGNOSIS — M546 Pain in thoracic spine: Secondary | ICD-10-CM | POA: Insufficient documentation

## 2024-02-04 ENCOUNTER — Other Ambulatory Visit: Payer: Self-pay

## 2024-02-06 NOTE — Therapy (Signed)
 OUTPATIENT PHYSICAL THERAPY UPPER EXTREMITY TREATMENT   Patient Name: Katie Benson MRN: [id number] DOB:[birth year], 43 y.o., female Today's Date: 02/07/2024  END OF SESSION:  PT End of Session - 02/07/24 1553     Visit Number 2    Number of Visits 5    Date for Recertification  03/06/24    Authorization Type [place] MEDICAID HEALTHY BLUE    Authorization Time Period approved 12 PT visits from 01/29/24-04/27/24    PT Start Time 1500    PT Stop Time 1545    PT Time Calculation (min) 45 min    Activity Tolerance Patient tolerated treatment well    Behavior During Therapy WFL for tasks assessed/performed           Past Medical History:  Diagnosis Date   Anemia    Arthritis    Fibroids    GERD (gastroesophageal reflux disease)    History of kidney stones    Neck injury    Past Surgical History:  Procedure Laterality Date   CESAREAN SECTION     EXCISION MASS, BACK Left 10/04/2023   Procedure: EXCISION MASS, BACK;  Surgeon: [name] Cordella Hamilton, MD;  Location: [facility];  Service: Orthopedics;  Laterality: Left;  left shoulder lipoma removal   IR ANGIOGRAM PELVIS SELECTIVE OR SUPRASELECTIVE  03/18/2018   IR ANGIOGRAM PELVIS SELECTIVE OR SUPRASELECTIVE  03/18/2018   IR ANGIOGRAM SELECTIVE EACH ADDITIONAL VESSEL  03/18/2018   IR ANGIOGRAM SELECTIVE EACH ADDITIONAL VESSEL  03/18/2018   IR EMBO TUMOR ORGAN ISCHEMIA INFARCT INC GUIDE ROADMAPPING  03/18/2018   IR RADIOLOGIST EVAL & MGMT  10/17/2017   IR RADIOLOGIST EVAL & MGMT  12/20/2017   IR RADIOLOGIST EVAL & MGMT  04/17/2018   IR US  GUIDE VASC ACCESS RIGHT  03/18/2018   KNEE ARTHROSCOPY WITH LATERAL MENISECTOMY Left 12/24/2018   Procedure: LEFT KNEE ARTHROSCOPY WITH LATERAL MENISECTOM, debriment of GANGLION CYST;  Surgeon: Anderson Maude ORN, MD;  Location: [facility];  Service: Orthopedics;  Laterality: Left;   PARATHYROIDECTOMY N/A 02/12/2018   Procedure: PARATHYROIDECTOMY;  Surgeon: Marolyn Nest, MD;  Location:  [facility];  Service: General;  Laterality: N/A;   WISDOM TOOTH EXTRACTION     age 8   Patient Active Problem List   Diagnosis Date Noted   Lipoma of back 10/21/2023   History of esophageal ulcer 12/22/2021   Ingrown nail of fifth toe 12/22/2021   Chronic toe pain, left foot 12/22/2021   Blurry vision, bilateral 12/22/2021   Left corneal abrasion 12/22/2021   Osteoarthritis of left knee 12/14/2020   Subacromial bursitis of right shoulder joint 07/15/2020   Cervical radiculopathy 06/24/2020   Effusion, left knee 06/24/2019   Pes anserine bursitis 04/29/2019   Bucket handle tear of lateral meniscus of left knee 12/12/2018   Ganglion cyst 12/12/2018   Fibroids 03/18/2018   S/P parathyroidectomy 02/12/2018   Primary hyperparathyroidism 01/08/2018   Hyperparathyroidism , secondary, non-renal 01/08/2018   Fibroid uterus 12/06/2017   Hypercalcemia 12/06/2017   Fibroid 10/15/2017   Abnormal uterine bleeding (AUB) 08/20/2017   Anemia 08/20/2017    PCP: No PCP  REFERRING PROVIDER: Shirly Carlin CROME, PA-C   REFERRING DIAG: D17.1 (ICD-10-CM) - Lipoma of back   THERAPY DIAG:  Pain in thoracic spine  Muscle weakness (generalized)  Rationale for Evaluation and Treatment: Rehabilitation  ONSET DATE: 10/04/23 Sx  SUBJECTIVE:  SUBJECTIVE STATEMENT: Pt reports her L mid back is hurting horribly, more than 10/10. The pain has been worse with the cold weather. Pt states she has completed her exs 3 days, but not the last 2 because of increased pain.  EVAL: Pt reports she is experiencing L mid back pain following Sx to remove a lipoma on 10/04/23. Prior to surgery, she notes having pain in this area esp when her back pressed into the bed or back of a chair.  She states being told the surgery was more extensive than  expected do to the lipoma's size and being interconnected to the muscle.  Hand dominance: Right  PERTINENT HISTORY: Pt reports cervical disc issue c a pinch nerve down the R UE and L knee pain  PN note from Charles L. Magnant PA-C 12/24/23 Plan: Patient is a 43 year old female who presents s/p excision of lipoma from upper back on 10/04/2023.  Here today for incision recheck primarily.  Incision is well-healed without evidence of infection or dehiscence.  Patient does complain of slightly increased pain in the shoulder blade region along with muscle spasms in this area.  She has difficulty sleeping.  Still very sensitive to touch in this area and she has pain that bothers her in the upper trapezius that extends into the lower trap region along with some pain in the latissimus area.  Flexeril  helps.  Gabapentin  helps.   On exam, patient has incision that is well-healed.  Intact shrugging of the shoulder.  Excellent rotator cuff strength of the left arm.  She has some weakness of lower trapezius musculature.  This seems secondary to pain.  Intact EPL, FPL, finger abduction of the left arm.   Plan at this time is refill Flexeril .  We will have her start physical therapy on [facility] for scapular mobility and lower trapezius strengthening exercises.  See her back in 6 weeks for clinical recheck.    PAIN:  Are you having pain? Yes: NPRS scale: 7-10/10.  Pain location: L interscapular/mid back area Pain description: throb, tight, spasms, sharp Aggravating factors: The use of the L upper quarter- symptoms increase c actively; sleep Relieving factors: Relaxing, heating pad  PRECAUTIONS: None  RED FLAGS: None   WEIGHT BEARING RESTRICTIONS: No  FALLS:  Has patient fallen in last 6 months? 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  DIAGNOSTIC FINDINGS:  See Epic and MD notes  PATIENT SURVEYS :  Quick Dash: 75% disability  Minimally Clinically Important Difference (MCID): 15-20 points  COGNITION: Overall cognitive status: Within functional limits for tasks assessed     SENSATION: Not tested  POSTURE: Forward head and shoulders  UPPER EXTREMITY ROM:   L shoulder AROMs were equal to the R. With all movements pt reported increased pain and pt utilized irregular trunk movements to complete. Active ROM Right eval Left eval  Shoulder flexion 130 130  Shoulder extension    Shoulder abduction    Shoulder adduction    Shoulder internal rotation T11 T11  Shoulder external rotation T4 T4  Elbow flexion    Elbow extension    Wrist flexion    Wrist extension    Wrist ulnar deviation  Wrist radial deviation    Wrist pronation    Wrist supination    (Blank rows = not tested)  UPPER EXTREMITY MMT:  MMT Right eval Left eval  Shoulder flexion 4+ 4+ p  Shoulder extension    Shoulder abduction 4+ 4+ p  Shoulder adduction    Shoulder internal rotation 5 5 p  Shoulder external rotation 5 5 p  Middle trapezius 4 3 p  Lower trapezius 4 3 p  Elbow flexion 5 5  Elbow extension 5 5  Wrist flexion    Wrist extension    Wrist ulnar deviation    Wrist radial deviation    Wrist pronation    Wrist supination    Grip strength (lbs) 83,85=84 68, 70=69  (Blank rows = not tested)  SHOULDER SPECIAL TESTS: Impingement tests: NT SLAP lesions: NT Instability tests: NT Rotator cuff assessment: Drop arm test: negative and Full can test: negative Biceps assessment: NT  PALPATION:  Significantly TTP to the L interscapula area                                                                                                                   TREATMENT DATE:  [facility] Adult PT Treatment:                                                DATE: 02/07/24 Therapeutic Exercise: Standing Shoulder Posterior Capsule Stretch trunk rotation x3 30 Seated roll outs fwd and lat c Pball Seated Upper Trapezius Stretch (Mirrored) x3 15 Standing Shoulder Row RTB 2x10 Shoulder extension YTB 2x10 Shoulder ER YTB 2x10 Shoulder flexion RTB x10 Updated HEP  [facility] Adult PT Treatment:                                                DATE: 01/29/24 Therapeutic Exercise: Developed, instructed in, and pt completed therex as noted in HEP  Self Care: Continue using of heating pad for symptom management   PATIENT EDUCATION: Education details: Eval findings, POC, HEP, self care  Person educated: Patient Education method: Explanation, Demonstration, Tactile cues, Verbal cues, and Handouts Education comprehension: verbalized understanding, returned demonstration, verbal cues required, and tactile cues required  HOME EXERCISE PROGRAM: Access Code: [email or web address] URL: [email or web address] Date: 02/07/2024 Prepared by: Dasie Daft  Exercises - Standing Shoulder Posterior Capsule Stretch  - 2 x daily - 7 x weekly - 1 sets - 3 reps - 30 hold - Standing Thoracic Open Book at Wall  - 1 x daily - 7 x weekly - 3 sets - 5 reps - 5 hold - Seated Flexion Stretch with Swiss Ball  - 2 x daily - 7 x weekly - 1 sets - 10 reps - 5-20 hold - Seated Upper Trapezius Stretch (  Mirrored)  - 2 x daily - 7 x weekly - 1 sets - 3 reps - 15 hold - Standing Shoulder Row with Anchored Resistance  - 1 x daily - 7 x weekly - 2 sets - 10 reps - 3 hold - Shoulder extension with resistance - Neutral  - 1 x daily - 7 x weekly - 2 sets - 10 reps - 3 hold - Shoulder External Rotation and Scapular Retraction with Resistance  - 1 x daily - 7 x weekly - 2 sets - 10 reps - 3 hold  ASSESSMENT:  CLINICAL IMPRESSION: Pt participated in PT for L upper shoulder/mid back flexibility and  strenghtning. Pt tolerated prescribed exs without an overall increase in pain. Skilled PT was provided to manage load and for cueing re: most proper technique. Pt's HEP was updated c both flexibility and strengthening. Pt was encouraged to complete her HEP daily.  EVAL: Patient is a 43 y.o. female who was seen today for physical therapy evaluation and treatment for D17.1 (ICD-10-CM) - Lipoma of back. 1 x week x 4 weeks, Scapular ROM and lower trap strength. Pt presents with significant L interscapular mid back pain and moderate L mid and lower scapula weakness. Otherwise, the AROM and strength of the L shoulder were found equal to the R. With AROMs of the L shoulder, pt demonstrated irregular trunk movements to complete the L arm motions. The trunk compensations appeared related to pain vs strength. Pt's Quick Dash self functional assessment indicates she perceives she is experiencing a high level of disability with her L shoulder girdle. A HEP was started. Pt will benefit from skilled PT 1w4 to address impairments to optimize function with less pain.    OBJECTIVE IMPAIRMENTS: decreased activity tolerance, decreased strength, increased muscle spasms, and pain.   ACTIVITY LIMITATIONS: carrying, lifting, bathing, toileting, dressing, reach over head, hygiene/grooming, and caring for others  PARTICIPATION LIMITATIONS: meal prep, cleaning, laundry, community activity, and occupation  PERSONAL FACTORS: Past/current experiences, Social background, and 1 comorbidity: cervical disc issue c a pinched nerve down R UE are also affecting patient's functional outcome.   REHAB POTENTIAL: Good  CLINICAL DECISION MAKING: Evolving/moderate complexity  EVALUATION COMPLEXITY: Moderate  GOALS:  SHORT TERM GOALS: Target date: 02/22/24  Pt will be Ind in an initial HEP  Baseline:started Goal status: INITIAL  2.  Pt will report 25% or greater improvement in pain for improved function and QOL Baseline:  7-10/10 Goal status: INITIAL  LONG TERM GOALS: Target date: 03/06/24  Pt will be Ind in a final HEP to maintain achieved LOF  Baseline: started Goal status: INITIAL  2.  Pt will report 50% or greater improvement in pain for improved function and QOL Baseline: 7-10/20 Goal status: INITIAL  3.  Increase mid and lower scapula strength for improved function and quality of mobility Baseline: 3/5 with pain Goal status: INITIAL  4.  Pt will be able to left 3# for improved function with household activities Baseline: NT Goal status: INITIAL  5.  Pt's Quick Dash score will improve by the MCID to 60% as indication of improved function  Baseline:75% disability  Goal status: INITIAL  PLAN: PT FREQUENCY: 1x/week  PT DURATION: 4 weeks  PLANNED INTERVENTIONS: 97164- PT Re-evaluation, 97110-Therapeutic exercises, 97530- Therapeutic activity, 97112- Neuromuscular re-education, 97535- Self Care, [id number]- Manual therapy, [id number]- Aquatic Therapy, H9716- Electrical stimulation (unattended), Patient/Family education, Taping, Joint mobilization, Cryotherapy, and Moist heat  PLAN FOR NEXT SESSION: Assess response to HEP; progress therex as indicated; use  of modalities, and manual therapy as indicated.   Dinesh Ulysse MS, PT 02/07/2024 4:19 PM

## 2024-02-07 ENCOUNTER — Ambulatory Visit

## 2024-02-07 DIAGNOSIS — M6281 Muscle weakness (generalized): Secondary | ICD-10-CM

## 2024-02-07 DIAGNOSIS — M546 Pain in thoracic spine: Secondary | ICD-10-CM

## 2024-02-08 ENCOUNTER — Ambulatory Visit: Admitting: Surgical

## 2024-02-08 ENCOUNTER — Other Ambulatory Visit: Payer: Self-pay

## 2024-02-08 DIAGNOSIS — D171 Benign lipomatous neoplasm of skin and subcutaneous tissue of trunk: Secondary | ICD-10-CM | POA: Diagnosis not present

## 2024-02-12 ENCOUNTER — Ambulatory Visit: Admitting: Physical Therapy

## 2024-02-13 ENCOUNTER — Encounter: Payer: Self-pay | Admitting: Surgical

## 2024-02-13 NOTE — Progress Notes (Cosign Needed)
 Post-Op Visit Note   Patient: Katie Benson           Date of Birth: [birth year]           MRN: [id number] Visit Date: 02/08/2024 PCP: Patient, No Pcp Per   Assessment & Plan:  Chief Complaint:  Chief Complaint  Patient presents with   Middle Back - Follow-up    10/04/2023 Excision mass, back   Left Knee - Pain   Visit Diagnoses: No diagnosis found.  Plan: Patient is a 43 year old female who presents s/p lipoma excision from upper back on 10/04/2023.  Still having some pain but she feels that her mobility in terms of her ability to move her arm is getting better.  Working on primarily mobility with her physical therapist.  Still having some difficulty sleeping and some increased sensitivity around the incision.  Sleeping is easier on her back than it was prior to lipoma removal so this is a plus for her.  On exam, patient has incision that is well-healed without evidence of infection or dehiscence.  She has increased sensitivity primarily on the left side of the incision (more lateral).  Intact rotator cuff strength of left shoulder rated 5/5 of supra, infra, subscap.  Able to shrug her shoulders bilaterally without difficulty or weakness with strength rated 5/5.  She has much easier active motion of the left shoulder above her head which is still painful but she is able to tolerate this much better compared with prior exam.  Plan at this time is continue with mobility and progress to strengthening when she has more pain-free range of motion.  She looks and feels to be making good progress with PT so we will plan to continue with this and see her back in 2 months for clinical recheck.  Follow-Up Instructions: No follow-ups on file.   Orders:  No orders of the defined types were placed in this encounter.  No orders of the defined types were placed in this encounter.   Imaging: No results found.  PMFS History: Patient Active Problem List   Diagnosis Date Noted   Lipoma of  back 10/21/2023   History of esophageal ulcer 12/22/2021   Ingrown nail of fifth toe 12/22/2021   Chronic toe pain, left foot 12/22/2021   Blurry vision, bilateral 12/22/2021   Left corneal abrasion 12/22/2021   Osteoarthritis of left knee 12/14/2020   Subacromial bursitis of right shoulder joint 07/15/2020   Cervical radiculopathy 06/24/2020   Effusion, left knee 06/24/2019   Pes anserine bursitis 04/29/2019   Bucket handle tear of lateral meniscus of left knee 12/12/2018   Ganglion cyst 12/12/2018   Fibroids 03/18/2018   S/P parathyroidectomy 02/12/2018   Primary hyperparathyroidism 01/08/2018   Hyperparathyroidism , secondary, non-renal 01/08/2018   Fibroid uterus 12/06/2017   Hypercalcemia 12/06/2017   Fibroid 10/15/2017   Abnormal uterine bleeding (AUB) 08/20/2017   Anemia 08/20/2017   Past Medical History:  Diagnosis Date   Anemia    Arthritis    Fibroids    GERD (gastroesophageal reflux disease)    History of kidney stones    Neck injury     Family History  Problem Relation Age of Onset   Diabetes Sister    Cancer Maternal Grandmother        ovarian   Hypercalcemia Mother    Other Father        HX Unknown    Past Surgical History:  Procedure Laterality Date   CESAREAN SECTION  EXCISION MASS, BACK Left 10/04/2023   Procedure: EXCISION MASS, BACK;  Surgeon: [name] Cordella Hamilton, MD;  Location: [facility];  Service: Orthopedics;  Laterality: Left;  left shoulder lipoma removal   IR ANGIOGRAM PELVIS SELECTIVE OR SUPRASELECTIVE  03/18/2018   IR ANGIOGRAM PELVIS SELECTIVE OR SUPRASELECTIVE  03/18/2018   IR ANGIOGRAM SELECTIVE EACH ADDITIONAL VESSEL  03/18/2018   IR ANGIOGRAM SELECTIVE EACH ADDITIONAL VESSEL  03/18/2018   IR EMBO TUMOR ORGAN ISCHEMIA INFARCT INC GUIDE ROADMAPPING  03/18/2018   IR RADIOLOGIST EVAL & MGMT  10/17/2017   IR RADIOLOGIST EVAL & MGMT  12/20/2017   IR RADIOLOGIST EVAL & MGMT  04/17/2018   IR US  GUIDE VASC ACCESS RIGHT  03/18/2018    KNEE ARTHROSCOPY WITH LATERAL MENISECTOMY Left 12/24/2018   Procedure: LEFT KNEE ARTHROSCOPY WITH LATERAL MENISECTOM, debriment of GANGLION CYST;  Surgeon: Anderson Maude ORN, MD;  Location: [facility];  Service: Orthopedics;  Laterality: Left;   PARATHYROIDECTOMY N/A 02/12/2018   Procedure: PARATHYROIDECTOMY;  Surgeon: Marolyn Nest, MD;  Location: [facility];  Service: General;  Laterality: N/A;   WISDOM TOOTH EXTRACTION     age 77   Social History   Occupational History   Occupation: unemployed  Tobacco Use   Smoking status: Some Days    Current packs/day: 0.25    Average packs/day: 0.3 packs/day for 14.0 years (3.5 ttl pk-yrs)    Types: Cigarettes   Smokeless tobacco: Never  Vaping Use   Vaping status: Never Used  Substance and Sexual Activity   Alcohol use: Yes    Comment: occassionally on weekends   Drug use: Yes    Types: Marijuana    Comment: multiple times a day   Sexual activity: Yes    Partners: Male    Birth control/protection: Condom

## 2024-02-18 ENCOUNTER — Other Ambulatory Visit: Payer: Self-pay

## 2024-02-18 NOTE — Therapy (Unsigned)
 " OUTPATIENT PHYSICAL THERAPY UPPER EXTREMITY TREATMENT   Patient Name: Katie Benson MRN: [id number] DOB:[birth year], 43 y.o., female Today's Date: 02/19/2024  END OF SESSION:  PT End of Session - 02/19/24 1510     Visit Number 3    Number of Visits 5    Date for Recertification  03/06/24    Authorization Type [place] MEDICA[employer] HE[employer] BLUE    Authorization Time Period approved 12 PT visits from 01/29/24-04/27/24    PT Start Time 1504    PT Stop Time 1545    PT Time Calculation (min) 41 min    Activity Tolerance Patient limited by pain    Behavior During Therapy [facility] for tasks assessed/performed            Past Medical History:  Diagnosis Date   Anemia    Arthritis    Fibroids    GERD (gastroesophageal reflux disease)    History of kidney stones    Neck injury    Past Surgical History:  Procedure Laterality Date   CESAREAN SECTION     EXCISION MASS, BACK Left 10/04/2023   Procedure: EXCISION MASS, BACK;  Surgeon: [name] Cordella Hamilton, MD;  Location: [facility];  Service: Orthopedics;  Laterality: Left;  left shoulder lipoma removal   IR ANGIOGRAM PELVIS SELECTIVE OR SUPRASELECTIVE  03/18/2018   IR ANGIOGRAM PELVIS SELECTIVE OR SUPRASELECTIVE  03/18/2018   IR ANGIOGRAM SELECTIVE EACH ADDITIONAL VESSEL  03/18/2018   IR ANGIOGRAM SELECTIVE EACH ADDITIONAL VESSEL  03/18/2018   IR EMBO TUMOR ORGAN ISCHEMIA INFARCT INC GUIDE ROADMAPPING  03/18/2018   IR RADIOLOGIST EVAL & MGMT  10/17/2017   IR RADIOLOGIST EVAL & MGMT  12/20/2017   IR RADIOLOGIST EVAL & MGMT  04/17/2018   IR US  GUIDE VASC ACCESS RIGHT  03/18/2018   KNEE ARTHROSCOPY WITH LATERAL MENISECTOMY Left 12/24/2018   Procedure: LEFT KNEE ARTHROSCOPY WITH LATERAL MENISECTOM, debriment of GANGLION CYST;  Surgeon: Anderson Maude ORN, MD;  Location: [facility];  Service: Orthopedics;  Laterality: Left;   PARATHYROIDECTOMY N/A 02/12/2018   Procedure: PARATHYROIDECTOMY;  Surgeon: Marolyn Nest, MD;  Location: [facility];   Service: General;  Laterality: N/A;   WISDOM TOOTH EXTRACTION     age 46   Patient Active Problem List   Diagnosis Date Noted   Lipoma of back 10/21/2023   History of esophageal ulcer 12/22/2021   Ingrown nail of fifth toe 12/22/2021   Chronic toe pain, left foot 12/22/2021   Blurry vision, bilateral 12/22/2021   Left corneal abrasion 12/22/2021   Osteoarthritis of left knee 12/14/2020   Subacromial bursitis of right shoulder joint 07/15/2020   Cervical radiculopathy 06/24/2020   Effusion, left knee 06/24/2019   Pes anserine bursitis 04/29/2019   Bucket handle tear of lateral meniscus of left knee 12/12/2018   Ganglion cyst 12/12/2018   Fibroids 03/18/2018   S/P parathyroidectomy 02/12/2018   Primary hyperparathyroidism 01/08/2018   Hyperparathyroidism , secondary, non-renal 01/08/2018   Fibroid uterus 12/06/2017   Hypercalcemia 12/06/2017   Fibroid 10/15/2017   Abnormal uterine bleeding (AUB) 08/20/2017   Anemia 08/20/2017    PCP: No PCP  REFERRING PROVIDER: Shirly Carlin CROME, PA-C   REFERRING DIAG: D17.1 (ICD-10-CM) - Lipoma of back   THERAPY DIAG:  Pain in thoracic spine  Muscle weakness (generalized)  Rationale for Evaluation and Treatment: Rehabilitation  ONSET DATE: 10/04/23 Sx  SUBJECTIVE:  SUBJECTIVE STATEMENT: Pt reports her pain has not improved.   EVAL: Pt reports she is experiencing L mid back pain following Sx to remove a lipoma on 10/04/23. Prior to surgery, she notes having pain in this area esp when her back pressed into the bed or back of a chair.  She states being told the surgery was more extensive than expected do to the lipoma's size and being interconnected to the muscle.  Hand dominance: Right  PERTINENT HISTORY: Pt reports cervical disc issue c a pinch nerve down  the R UE and L knee pain  PN note from Charles L. Magnant PA-C 12/24/23 Plan: Patient is a 43 year old female who presents s/p excision of lipoma from upper back on 10/04/2023.  Here today for incision recheck primarily.  Incision is well-healed without evidence of infection or dehiscence.  Patient does complain of slightly increased pain in the shoulder blade region along with muscle spasms in this area.  She has difficulty sleeping.  Still very sensitive to touch in this area and she has pain that bothers her in the upper trapezius that extends into the lower trap region along with some pain in the latissimus area.  Flexeril  helps.  Gabapentin  helps.   On exam, patient has incision that is well-healed.  Intact shrugging of the shoulder.  Excellent rotator cuff strength of the left arm.  She has some weakness of lower trapezius musculature.  This seems secondary to pain.  Intact EPL, FPL, finger abduction of the left arm.   Plan at this time is refill Flexeril .  We will have her start physical therapy on [facility] for scapular mobility and lower trapezius strengthening exercises.  See her back in 6 weeks for clinical recheck.    PAIN:  Are you having pain? Yes: NPRS scale: 7-10/10. Current: 10+/10 Pain location: L interscapular/mid back area Pain description: throb, tight, spasms, sharp Aggravating factors: The use of the L upper quarter- symptoms increase c actively; sleep Relieving factors: Relaxing, heating pad  PRECAUTIONS: None  RED FLAGS: None   WEIGHT BEARING RESTRICTIONS: No  FALLS:  Has patient fallen in last 6 months? 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  DIAGNOSTIC FINDINGS:  See Epic and MD notes  PATIENT SURVEYS :  Quick Dash: 75% disability  Minimally Clinically Important Difference (MCID): 15-20 points  COGNITION: Overall cognitive status: Within functional limits for tasks assessed     SENSATION: Not tested  POSTURE: Forward head and shoulders  UPPER EXTREMITY ROM:   L shoulder AROMs were equal to the R. With all movements pt reported increased pain and pt utilized irregular trunk movements to complete. Active ROM Right eval Left eval  Shoulder flexion 130 130  Shoulder extension    Shoulder abduction    Shoulder adduction    Shoulder internal rotation T11 T11  Shoulder external rotation T4 T4  Elbow flexion    Elbow extension    Wrist flexion    Wrist extension    Wrist ulnar deviation    Wrist radial deviation    Wrist pronation    Wrist supination    (Blank rows = not tested)  UPPER EXTREMITY MMT:  MMT Right  eval Left eval  Shoulder flexion 4+ 4+ p  Shoulder extension    Shoulder abduction 4+ 4+ p  Shoulder adduction    Shoulder internal rotation 5 5 p  Shoulder external rotation 5 5 p  Middle trapezius 4 3 p  Lower trapezius 4 3 p  Elbow flexion 5 5  Elbow extension 5 5  Wrist flexion    Wrist extension    Wrist ulnar deviation    Wrist radial deviation    Wrist pronation    Wrist supination    Grip strength (lbs) 83,85=84 68, 70=69  (Blank rows = not tested)  SHOULDER SPECIAL TESTS: Impingement tests: NT SLAP lesions: NT Instability tests: NT Rotator cuff assessment: Drop arm test: negative and Full can test: negative Biceps assessment: NT  PALPATION:  Significantly TTP to the L interscapula area                                                                                                                  TREATMENT DATE:  [facility] Adult PT Treatment:                                                DATE: 02/19/24 Therapeutic Exercise: Standing Shoulder Row RTB  2x10 Shoulder extension YTB 2x10 Shoulder ER YTB 2x10 Shoulder flexion RTB x10 Updated HEP Manual Therapy: STM to the L interscapular, upper trap, and levator Self Care: Instruction in self massage c control using a theracane and tennis ball on wall   [facility] Adult PT Treatment:                                                DATE: 02/07/24 Therapeutic Exercise: Standing Shoulder Posterior Capsule Stretch trunk rotation x3 30 Seated roll outs fwd and lat c Pball Seated Upper Trapezius Stretch (Mirrored) x3 15 Standing Shoulder Row RTB 2x10 Shoulder extension YTB 2x10 Shoulder ER YTB 2x10 Shoulder flexion RTB x10 Updated HEP  [facility] Adult PT Treatment:                                                DATE: 01/29/24 Therapeutic Exercise: Developed, instructed in, and pt completed therex as noted in HEP  Self Care: Continue using of heating pad for symptom management   PATIENT EDUCATION: Education details: Eval findings, POC, HEP, self care  Person educated: Patient Education method: Explanation, Demonstration, Tactile cues, Verbal cues, and Handouts Education comprehension: verbalized understanding, returned demonstration, verbal cues required, and tactile cues required  HOME EXERCISE PROGRAM: Access Code: [email or web address] URL: [email or web address] Date: 02/07/2024 Prepared by: Dasie Daft  Exercises - Standing Shoulder Posterior  Capsule Stretch  - 2 x daily - 7 x weekly - 1 sets - 3 reps - 30 hold - Standing Thoracic Open Book at Wall  - 1 x daily - 7 x weekly - 3 sets - 5 reps - 5 hold - Seated Flexion Stretch with Swiss Ball  - 2 x daily - 7 x weekly - 1 sets - 10 reps - 5-20 hold - Seated Upper Trapezius Stretch (Mirrored)  - 2 x daily - 7 x weekly - 1 sets - 3 reps - 15 hold - Standing Shoulder Row with Anchored Resistance  - 1 x daily - 7 x weekly - 2 sets - 10 reps - 3 hold - Shoulder extension with resistance - Neutral  - 1 x daily - 7 x weekly - 2 sets - 10 reps -  3 hold - Shoulder External Rotation and Scapular Retraction with Resistance  - 1 x daily - 7 x weekly - 2 sets - 10 reps - 3 hold  ASSESSMENT:  CLINICAL IMPRESSION: Provided light STM to the L interscapular, upper trap, levator. Increased muscle tightness was palpated. Pt would move her L upper quarter away from pressure that felt too strong, but after the light STM she stated the area felt less tight. Pt was then instructed in how to complete massage to the area herself through a massage cane and a tennis ball on wall. Periscapular strengthening was then completed. Pt tolerated PT today without adverse effects.  EVAL: Patient is a 43 y.o. female who was seen today for physical therapy evaluation and treatment for D17.1 (ICD-10-CM) - Lipoma of back. 1 x week x 4 weeks, Scapular ROM and lower trap strength. Pt presents with significant L interscapular mid back pain and moderate L mid and lower scapula weakness. Otherwise, the AROM and strength of the L shoulder were found equal to the R. With AROMs of the L shoulder, pt demonstrated irregular trunk movements to complete the L arm motions. The trunk compensations appeared related to pain vs strength. Pt's Quick Dash self functional assessment indicates she perceives she is experiencing a high level of disability with her L shoulder girdle. A HEP was started. Pt will benefit from skilled PT 1w4 to address impairments to optimize function with less pain.    OBJECTIVE IMPAIRMENTS: decreased activity tolerance, decreased strength, increased muscle spasms, and pain.   ACTIVITY LIMITATIONS: carrying, lifting, bathing, toileting, dressing, reach over head, hygiene/grooming, and caring for others  PARTICIPATION LIMITATIONS: meal prep, cleaning, laundry, community activity, and occupation  PERSONAL FACTORS: Past/current experiences, Social background, and 1 comorbidity: cervical disc issue c a pinched nerve down R UE are also affecting patient's functional  outcome.   REHAB POTENTIAL: Good  CLINICAL DECISION MAKING: Evolving/moderate complexity  EVALUATION COMPLEXITY: Moderate  GOALS:  SHORT TERM GOALS: Target date: 02/22/24  Pt will be Ind in an initial HEP  Baseline:started Goal status: INITIAL  2.  Pt will report 25% or greater improvement in pain for improved function and QOL Baseline: 7-10/10 Goal status: INITIAL  LONG TERM GOALS: Target date: 03/06/24  Pt will be Ind in a final HEP to maintain achieved LOF  Baseline: started Goal status: INITIAL  2.  Pt will report 50% or greater improvement in pain for improved function and QOL Baseline: 7-10/20 Goal status: INITIAL  3.  Increase mid and lower scapula strength for improved function and quality of mobility Baseline: 3/5 with pain Goal status: INITIAL  4.  Pt will be able to left  3# for improved function with household activities Baseline: NT Goal status: INITIAL  5.  Pt's Quick Dash score will improve by the MCID to 60% as indication of improved function  Baseline:75% disability  Goal status: INITIAL  PLAN: PT FREQUENCY: 1x/week  PT DURATION: 4 weeks  PLANNED INTERVENTIONS: 97164- PT Re-evaluation, 97110-Therapeutic exercises, 97530- Therapeutic activity, 97112- Neuromuscular re-education, 97535- Self Care, [id number]- Manual therapy, [id number]- Aquatic Therapy, [id number]- Electrical stimulation (unattended), Patient/Family education, Taping, Joint mobilization, Cryotherapy, and Moist heat  PLAN FOR NEXT SESSION: Assess response to HEP; progress therex as indicated; use of modalities, and manual therapy as indicated.   Hennesy Sobalvarro MS, PT 02/19/2024 10:22 PM     "

## 2024-02-19 ENCOUNTER — Ambulatory Visit

## 2024-02-19 ENCOUNTER — Other Ambulatory Visit: Payer: Self-pay

## 2024-02-19 DIAGNOSIS — M546 Pain in thoracic spine: Secondary | ICD-10-CM | POA: Diagnosis not present

## 2024-02-19 DIAGNOSIS — M6281 Muscle weakness (generalized): Secondary | ICD-10-CM

## 2024-02-25 NOTE — Therapy (Incomplete)
 " OUTPATIENT PHYSICAL THERAPY UPPER EXTREMITY TREATMENT   Patient Name: Katie Benson MRN: [id number] DOB:[birth year], 43 y.o., female Today's Date: 02/25/2024  END OF SESSION:      Past Medical History:  Diagnosis Date   Anemia    Arthritis    Fibroids    GERD (gastroesophageal reflux disease)    History of kidney stones    Neck injury    Past Surgical History:  Procedure Laterality Date   CESAREAN SECTION     EXCISION MASS, BACK Left 10/04/2023   Procedure: EXCISION MASS, BACK;  Surgeon: [name] Cordella Hamilton, MD;  Location:  [facility];  Service: Orthopedics;  Laterality: Left;  left shoulder lipoma removal   IR ANGIOGRAM PELVIS SELECTIVE OR SUPRASELECTIVE  03/18/2018   IR ANGIOGRAM PELVIS SELECTIVE OR SUPRASELECTIVE  03/18/2018   IR ANGIOGRAM SELECTIVE EACH ADDITIONAL VESSEL  03/18/2018   IR ANGIOGRAM SELECTIVE EACH ADDITIONAL VESSEL  03/18/2018   IR EMBO TUMOR ORGAN ISCHEMIA INFARCT INC GUIDE ROADMAPPING  03/18/2018   IR RADIOLOGIST EVAL & MGMT  10/17/2017   IR RADIOLOGIST EVAL & MGMT  12/20/2017   IR RADIOLOGIST EVAL & MGMT  04/17/2018   IR US  GUIDE VASC ACCESS RIGHT  03/18/2018   KNEE ARTHROSCOPY WITH LATERAL MENISECTOMY Left 12/24/2018   Procedure: LEFT KNEE ARTHROSCOPY WITH LATERAL MENISECTOM, debriment of GANGLION CYST;  Surgeon: Anderson Maude ORN, MD;  Location: [facility];  Service: Orthopedics;  Laterality: Left;   PARATHYROIDECTOMY N/A 02/12/2018   Procedure: PARATHYROIDECTOMY;  Surgeon: Marolyn Nest, MD;  Location: [facility];  Service: General;  Laterality: N/A;   WISDOM TOOTH EXTRACTION     age 69   Patient Active Problem List   Diagnosis Date Noted   Lipoma of back 10/21/2023   History of esophageal ulcer 12/22/2021   Ingrown nail of fifth toe 12/22/2021   Chronic toe pain, left foot 12/22/2021   Blurry vision, bilateral 12/22/2021   Left corneal abrasion 12/22/2021   Osteoarthritis of left knee 12/14/2020   Subacromial bursitis of right  shoulder joint 07/15/2020   Cervical radiculopathy 06/24/2020   Effusion, left knee 06/24/2019   Pes anserine bursitis 04/29/2019   Bucket handle tear of lateral meniscus of left knee 12/12/2018   Ganglion cyst 12/12/2018   Fibroids 03/18/2018   S/P parathyroidectomy 02/12/2018   Primary hyperparathyroidism 01/08/2018   Hyperparathyroidism , secondary, non-renal 01/08/2018   Fibroid uterus 12/06/2017   Hypercalcemia 12/06/2017   Fibroid 10/15/2017   Abnormal uterine bleeding (AUB) 08/20/2017   Anemia 08/20/2017    PCP: No PCP  REFERRING PROVIDER: Shirly Carlin CROME, PA-C   REFERRING DIAG: D17.1 (ICD-10-CM) - Lipoma of back   THERAPY DIAG:  No diagnosis found.  Rationale for Evaluation and Treatment: Rehabilitation  ONSET DATE: 10/04/23 Sx  SUBJECTIVE:  SUBJECTIVE STATEMENT: Pt reports her pain has not improved.   EVAL: Pt reports she is experiencing L mid back pain following Sx to remove a lipoma on 10/04/23. Prior to surgery, she notes having pain in this area esp when her back pressed into the bed or back of a chair.  She states being told the surgery was more extensive than expected do to the lipoma's size and being interconnected to the muscle.  Hand dominance: Right  PERTINENT HISTORY: Pt reports cervical disc issue c a pinch nerve down the R UE and L knee pain  PN note from Charles L. Magnant PA-C 12/24/23 Plan: Patient is a 43 year old female who presents s/p excision of lipoma from upper back on 10/04/2023.  Here today for incision recheck primarily.  Incision is well-healed without evidence of infection or dehiscence.  Patient does complain of slightly increased pain in the shoulder blade region along with muscle spasms in this area.  She has difficulty sleeping.  Still very sensitive to touch  in this area and she has pain that bothers her in the upper trapezius that extends into the lower trap region along with some pain in the latissimus area.  Flexeril  helps.  Gabapentin  helps.   On exam, patient has incision that is well-healed.  Intact shrugging of the shoulder.  Excellent rotator cuff strength of the left arm.  She has some weakness of lower trapezius musculature.  This seems secondary to pain.  Intact EPL, FPL, finger abduction of the left arm.   Plan at this time is refill Flexeril .  We will have her start physical therapy on [facility] for scapular mobility and lower trapezius strengthening exercises.  See her back in 6 weeks for clinical recheck.    PAIN:  Are you having pain? Yes: NPRS scale: 7-10/10. Current: 10+/10 Pain location: L interscapular/mid back area Pain description: throb, tight, spasms, sharp Aggravating factors: The use of the L upper quarter- symptoms increase c actively; sleep Relieving factors: Relaxing, heating pad  PRECAUTIONS: None  RED FLAGS: None   WEIGHT BEARING RESTRICTIONS: No  FALLS:  Has patient fallen in last 6 months? 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  DIAGNOSTIC FINDINGS:  See Epic and MD notes  PATIENT SURVEYS :  Quick Dash: 75% disability  Minimally Clinically Important Difference (MCID): 15-20 points  COGNITION: Overall cognitive status: Within functional limits for tasks assessed     SENSATION: Not tested  POSTURE: Forward head and shoulders  UPPER EXTREMITY ROM:   L shoulder AROMs were equal to the R. With all movements pt reported increased pain and pt utilized  irregular trunk movements to complete. Active ROM Right eval Left eval  Shoulder flexion 130 130  Shoulder extension    Shoulder abduction    Shoulder adduction    Shoulder internal rotation T11 T11  Shoulder external rotation T4 T4  Elbow flexion    Elbow extension    Wrist flexion    Wrist extension    Wrist ulnar deviation    Wrist radial deviation    Wrist pronation    Wrist supination    (Blank rows = not tested)  UPPER EXTREMITY MMT:  MMT Right  eval Left eval  Shoulder flexion 4+ 4+ p  Shoulder extension    Shoulder abduction 4+ 4+ p  Shoulder adduction    Shoulder internal rotation 5 5 p  Shoulder external rotation 5 5 p  Middle trapezius 4 3 p  Lower trapezius 4 3 p  Elbow flexion 5 5  Elbow extension 5 5  Wrist flexion    Wrist extension    Wrist ulnar deviation    Wrist radial deviation    Wrist pronation    Wrist supination    Grip strength (lbs) 83,85=84 68, 70=69  (Blank rows = not tested)  SHOULDER SPECIAL TESTS: Impingement tests: NT SLAP lesions: NT Instability tests: NT Rotator cuff assessment: Drop arm test: negative and Full can test: negative Biceps assessment: NT  PALPATION:  Significantly TTP to the L interscapula area                                                                                                                  TREATMENT DATE:  [facility] Adult PT Treatment:                                                DATE: 02/26/24 Therapeutic Exercise: Standing Shoulder Row RTB 2x10 Shoulder extension YTB 2x10 Shoulder ER YTB 2x10 Shoulder flexion RTB x10 Updated HEP Manual Therapy: STM to the L interscapular, upper trap, and levator Self Care: Instruction in self massage c control using a theracane and tennis ball on wall  Therapeutic Exercise: *** Manual Therapy: *** Neuromuscular re-ed: *** Therapeutic Activity: *** Modalities: *** Self Care: ***  [facility] Adult PT Treatment:                                                 DATE: 02/19/24 Therapeutic Exercise: Standing Shoulder Row RTB 2x10 Shoulder extension YTB 2x10 Shoulder ER YTB 2x10 Shoulder flexion RTB x10 Updated HEP Manual Therapy: STM to the L interscapular, upper trap, and levator Self Care: Instruction in self massage c control using a theracane and tennis ball on wall   [facility] Adult PT Treatment:                                                DATE: 02/07/24 Therapeutic Exercise: Standing Shoulder Posterior Capsule Stretch trunk rotation x3 30 Seated roll outs fwd and lat c Pball Seated Upper Trapezius Stretch (Mirrored) x3 15 Standing Shoulder Row RTB 2x10 Shoulder extension YTB 2x10 Shoulder ER YTB 2x10 Shoulder flexion RTB x10 Updated HEP  [facility] Adult PT Treatment:  DATE: 01/29/24 Therapeutic Exercise: Developed, instructed in, and pt completed therex as noted in HEP  Self Care: Continue using of heating pad for symptom management   PATIENT EDUCATION: Education details: Eval findings, POC, HEP, self care  Person educated: Patient Education method: Explanation, Demonstration, Tactile cues, Verbal cues, and Handouts Education comprehension: verbalized understanding, returned demonstration, verbal cues required, and tactile cues required  HOME EXERCISE PROGRAM: Access Code: [email or web address] URL: [email or web address] Date: 02/07/2024 Prepared by: Dasie Daft  Exercises - Standing Shoulder Posterior Capsule Stretch  - 2 x daily - 7 x weekly - 1 sets - 3 reps - 30 hold - Standing Thoracic Open Book at Wall  - 1 x daily - 7 x weekly - 3 sets - 5 reps - 5 hold - Seated Flexion Stretch with Swiss Ball  - 2 x daily - 7 x weekly - 1 sets - 10 reps - 5-20 hold - Seated Upper Trapezius Stretch (Mirrored)  - 2 x daily - 7 x weekly - 1 sets - 3 reps - 15 hold - Standing Shoulder Row with Anchored Resistance  - 1 x daily - 7 x weekly - 2 sets - 10 reps - 3 hold - Shoulder extension  with resistance - Neutral  - 1 x daily - 7 x weekly - 2 sets - 10 reps - 3 hold - Shoulder External Rotation and Scapular Retraction with Resistance  - 1 x daily - 7 x weekly - 2 sets - 10 reps - 3 hold  ASSESSMENT:  CLINICAL IMPRESSION: Provided light STM to the L interscapular, upper trap, levator. Increased muscle tightness was palpated. Pt would move her L upper quarter away from pressure that felt too strong, but after the light STM she stated the area felt less tight. Pt was then instructed in how to complete massage to the area herself through a massage cane and a tennis ball on wall. Periscapular strengthening was then completed. Pt tolerated PT today without adverse effects.  EVAL: Patient is a 43 y.o. female who was seen today for physical therapy evaluation and treatment for D17.1 (ICD-10-CM) - Lipoma of back. 1 x week x 4 weeks, Scapular ROM and lower trap strength. Pt presents with significant L interscapular mid back pain and moderate L mid and lower scapula weakness. Otherwise, the AROM and strength of the L shoulder were found equal to the R. With AROMs of the L shoulder, pt demonstrated irregular trunk movements to complete the L arm motions. The trunk compensations appeared related to pain vs strength. Pt's Quick Dash self functional assessment indicates she perceives she is experiencing a high level of disability with her L shoulder girdle. A HEP was started. Pt will benefit from skilled PT 1w4 to address impairments to optimize function with less pain.    OBJECTIVE IMPAIRMENTS: decreased activity tolerance, decreased strength, increased muscle spasms, and pain.   ACTIVITY LIMITATIONS: carrying, lifting, bathing, toileting, dressing, reach over head, hygiene/grooming, and caring for others  PARTICIPATION LIMITATIONS: meal prep, cleaning, laundry, community activity, and occupation  PERSONAL FACTORS: Past/current experiences, Social background, and 1 comorbidity: cervical disc  issue c a pinched nerve down R UE are also affecting patient's functional outcome.   REHAB POTENTIAL: Good  CLINICAL DECISION MAKING: Evolving/moderate complexity  EVALUATION COMPLEXITY: Moderate  GOALS:  SHORT TERM GOALS: Target date: 02/22/24  Pt will be Ind in an initial HEP  Baseline:started Goal status: INITIAL  2.  Pt will report 25% or greater improvement in pain for improved function and  QOL Baseline: 7-10/10 Goal status: INITIAL  LONG TERM GOALS: Target date: 03/06/24  Pt will be Ind in a final HEP to maintain achieved LOF  Baseline: started Goal status: INITIAL  2.  Pt will report 50% or greater improvement in pain for improved function and QOL Baseline: 7-10/20 Goal status: INITIAL  3.  Increase mid and lower scapula strength for improved function and quality of mobility Baseline: 3/5 with pain Goal status: INITIAL  4.  Pt will be able to left 3# for improved function with household activities Baseline: NT Goal status: INITIAL  5.  Pt's Quick Dash score will improve by the MCID to 60% as indication of improved function  Baseline:75% disability  Goal status: INITIAL  PLAN: PT FREQUENCY: 1x/week  PT DURATION: 4 weeks  PLANNED INTERVENTIONS: [id number]- PT Re-evaluation, 97110-Therapeutic exercises, 97530- Therapeutic activity, 97112- Neuromuscular re-education, 97535- Self Care, [id number]- Manual therapy, [id number]- Aquatic Therapy, [id number]- Electrical stimulation (unattended), Patient/Family education, Taping, Joint mobilization, Cryotherapy, and Moist heat  PLAN FOR NEXT SESSION: Assess response to HEP; progress therex as indicated; use of modalities, and manual therapy as indicated.   Angelino Rumery MS, PT 02/25/2024 4:33 PM     "

## 2024-02-26 ENCOUNTER — Ambulatory Visit

## 2024-02-27 ENCOUNTER — Other Ambulatory Visit: Payer: Self-pay

## 2024-02-27 ENCOUNTER — Other Ambulatory Visit: Payer: Self-pay | Admitting: Surgical

## 2024-02-27 MED ORDER — GABAPENTIN 300 MG PO CAPS
300.0000 mg | ORAL_CAPSULE | Freq: Three times a day (TID) | ORAL | 1 refills | Status: AC
  Filled 2024-02-27 – 2024-03-03 (×3): qty 90, 30d supply, fill #0
  Filled 2024-04-01: qty 90, 30d supply, fill #1

## 2024-02-27 MED ORDER — CYCLOBENZAPRINE HCL 10 MG PO TABS
10.0000 mg | ORAL_TABLET | Freq: Three times a day (TID) | ORAL | 1 refills | Status: AC
  Filled 2024-02-27 – 2024-03-03 (×3): qty 90, 30d supply, fill #0
  Filled 2024-04-01: qty 90, 30d supply, fill #1

## 2024-02-29 ENCOUNTER — Other Ambulatory Visit: Payer: Self-pay

## 2024-03-03 ENCOUNTER — Other Ambulatory Visit: Payer: Self-pay

## 2024-03-04 NOTE — Therapy (Signed)
 " OUTPATIENT PHYSICAL THERAPY UPPER EXTREMITY TREATMENT/ReCert   Patient Name: Katie Benson MRN: [id number] DOB:1981/02/18, 44 y.o., female Today's Date: 03/05/2024  END OF SESSION:  PT End of Session - 03/05/24 1703     Visit Number 4    Number of Visits 12    Date for Recertification  04/23/24    Authorization Type [place] MEDICAID HE[employer]Y BLUE    Authorization Time Period approved 12 PT visits from 01/29/24-04/27/24    Authorization - Visit Number 4    Authorization - Number of Visits 12    PT Start Time 1648    PT Stop Time 1720    PT Time Calculation (min) 32 min    Activity Tolerance Patient limited by pain    Behavior During Therapy WFL for tasks assessed/performed             Past Medical History:  Diagnosis Date   Anemia    Arthritis    Fibroids    GERD (gastroesophageal reflux disease)    History of kidney stones    Neck injury    Past Surgical History:  Procedure Laterality Date   CESAREAN SECTION     EXCISION MASS, BACK Left 10/04/2023   Procedure: EXCISION MASS, BACK;  Surgeon: [name] Cordella Hamilton, MD;  Location: [facility];  Service: Orthopedics;  Laterality: Left;  left shoulder lipoma removal   IR ANGIOGRAM PELVIS SELECTIVE OR SUPRASELECTIVE  03/18/2018   IR ANGIOGRAM PELVIS SELECTIVE OR SUPRASELECTIVE  03/18/2018   IR ANGIOGRAM SELECTIVE EACH ADDITIONAL VESSEL  03/18/2018   IR ANGIOGRAM SELECTIVE EACH ADDITIONAL VESSEL  03/18/2018   IR EMBO TUMOR ORGAN ISCHEMIA INFARCT INC GUIDE ROADMAPPING  03/18/2018   IR RADIOLOGIST EVAL & MGMT  10/17/2017   IR RADIOLOGIST EVAL & MGMT  12/20/2017   IR RADIOLOGIST EVAL & MGMT  04/17/2018   IR US  GUIDE VASC ACCESS RIGHT  03/18/2018   KNEE ARTHROSCOPY WITH LATERAL MENISECTOMY Left 12/24/2018   Procedure: LEFT KNEE ARTHROSCOPY WITH LATERAL MENISECTOM, debriment of GANGLION CYST;  Surgeon: Anderson Maude ORN, MD;  Location: [facility];  Service: Orthopedics;  Laterality: Left;   PARATHYROIDECTOMY N/A 02/12/2018    Procedure: PARATHYROIDECTOMY;  Surgeon: Marolyn Nest, MD;  Location: [facility];  Service: General;  Laterality: N/A;   WISDOM TOOTH EXTRACTION     age 65   Patient Active Problem List   Diagnosis Date Noted   Lipoma of back 10/21/2023   History of esophageal ulcer 12/22/2021   Ingrown nail of fifth toe 12/22/2021   Chronic toe pain, left foot 12/22/2021   Blurry vision, bilateral 12/22/2021   Left corneal abrasion 12/22/2021   Osteoarthritis of left knee 12/14/2020   Subacromial bursitis of right shoulder joint 07/15/2020   Cervical radiculopathy 06/24/2020   Effusion, left knee 06/24/2019   Pes anserine bursitis 04/29/2019   Bucket handle tear of lateral meniscus of left knee 12/12/2018   Ganglion cyst 12/12/2018   Fibroids 03/18/2018   S/P parathyroidectomy 02/12/2018   Primary hyperparathyroidism 01/08/2018   Hyperparathyroidism , secondary, non-renal 01/08/2018   Fibroid uterus 12/06/2017   Hypercalcemia 12/06/2017   Fibroid 10/15/2017   Abnormal uterine bleeding (AUB) 08/20/2017   Anemia 08/20/2017    PCP: No PCP  REFERRING PROVIDER: Shirly Carlin CROME, PA-C   REFERRING DIAG: D17.1 (ICD-10-CM) - Lipoma of back   THERAPY DIAG:  Pain in thoracic spine  Muscle weakness (generalized)  Rationale for Evaluation and Treatment: Rehabilitation  ONSET DATE: 10/04/23 Sx  SUBJECTIVE:  SUBJECTIVE STATEMENT: Pt reports her pain is about the same, but feels she is moving her L arm some better.   EVAL: Pt reports she is experiencing L mid back pain following Sx to remove a lipoma on 10/04/23. Prior to surgery, she notes having pain in this area esp when her back pressed into the bed or back of a chair.  She states being told the surgery was more extensive than expected do to the lipoma's size and  being interconnected to the muscle.  Hand dominance: Right  PERTINENT HISTORY: Pt reports cervical disc issue c a pinch nerve down the R UE and L knee pain  PN note from Charles L. Magnant PA-C 12/24/23 Plan: Patient is a 44 year old female who presents s/p excision of lipoma from upper back on 10/04/2023.  Here today for incision recheck primarily.  Incision is well-healed without evidence of infection or dehiscence.  Patient does complain of slightly increased pain in the shoulder blade region along with muscle spasms in this area.  She has difficulty sleeping.  Still very sensitive to touch in this area and she has pain that bothers her in the upper trapezius that extends into the lower trap region along with some pain in the latissimus area.  Flexeril  helps.  Gabapentin  helps.   On exam, patient has incision that is well-healed.  Intact shrugging of the shoulder.  Excellent rotator cuff strength of the left arm.  She has some weakness of lower trapezius musculature.  This seems secondary to pain.  Intact EPL, FPL, finger abduction of the left arm.   Plan at this time is refill Flexeril .  We will have her start physical therapy on [facility] for scapular mobility and lower trapezius strengthening exercises.  See her back in 6 weeks for clinical recheck.    PAIN:  Are you having pain? Yes: NPRS scale: 7-10/10. Current: 10+/10 Pain location: L interscapular/mid back area Pain description: throb, tight, spasms, sharp Aggravating factors: The use of the L upper quarter- symptoms increase c actively; sleep Relieving factors: Relaxing, heating pad  PRECAUTIONS: None  RED FLAGS: None   WEIGHT BEARING RESTRICTIONS: No  FALLS:  Has patient fallen in last 6 months? 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  DIAGNOSTIC FINDINGS:  See Epic and MD notes  PATIENT SURVEYS :  Quick Dash: 75% disability  Minimally Clinically Important Difference (MCID): 15-20 points  COGNITION: Overall cognitive status: Within functional limits for tasks assessed     SENSATION: Not tested  POSTURE: Forward head and shoulders  UPPER EXTREMITY ROM:   L shoulder AROMs were equal to the R. With all movements pt reported increased pain and pt utilized irregular trunk movements to complete. Active ROM Right eval Left eval  Shoulder flexion 130 130  Shoulder extension    Shoulder abduction    Shoulder adduction    Shoulder internal rotation T11 T11  Shoulder external rotation T4 T4  Elbow flexion    Elbow extension    Wrist flexion    Wrist extension    Wrist ulnar deviation    Wrist radial deviation    Wrist pronation    Wrist supination    (Blank  rows = not tested)  UPPER EXTREMITY MMT:  MMT Right eval Left eval  Shoulder flexion 4+ 4+ p  Shoulder extension    Shoulder abduction 4+ 4+ p  Shoulder adduction    Shoulder internal rotation 5 5 p  Shoulder external rotation 5 5 p  Middle trapezius 4 3 p  Lower trapezius 4 3 p  Elbow flexion 5 5  Elbow extension 5 5  Wrist flexion    Wrist extension    Wrist ulnar deviation    Wrist radial deviation    Wrist pronation    Wrist supination    Grip strength (lbs) 83,85=84 68, 70=69  (Blank rows = not tested)  SHOULDER SPECIAL TESTS: Impingement tests: NT SLAP lesions: NT Instability tests: NT Rotator cuff assessment: Drop arm test: negative and Full can test: negative Biceps assessment: NT  PALPATION:  Significantly TTP to the L interscapula area                                                                                                                  TREATMENT DATE:   [facility] Adult PT Treatment:                                                DATE: 03/06/23 Therapeutic Exercise: Shoulder/upper quadrant fwd and lateral c Pball Shoulder flex wall slides 2x10 Standing Shoulder Row RTB 2x10 Shoulder extension YTB 2x10 Shoulder ER YTB 2x10 Updated HEP  [facility] Adult PT Treatment:                                                DATE: 02/19/24 Therapeutic Exercise: Standing Shoulder Row RTB 2x10 Shoulder extension YTB 2x10 Shoulder ER YTB 2x10 Shoulder flexion RTB x10 Updated HEP Manual Therapy: STM to the L interscapular, upper trap, and levator Self Care: Instruction in self massage c control using a theracane and tennis ball on wall   [facility] Adult PT Treatment:                                                DATE: 02/07/24 Therapeutic Exercise: Standing Shoulder Posterior Capsule Stretch trunk rotation x3 30 Seated roll outs fwd and lat c Pball Seated Upper Trapezius Stretch (Mirrored) x3 15 Standing Shoulder Row RTB 2x10 Shoulder extension YTB 2x10 Shoulder ER YTB 2x10 Shoulder flexion RTB x10 Updated HEP  [facility] Adult PT Treatment:  DATE: 01/29/24 Therapeutic Exercise: Developed, instructed in, and pt completed therex as noted in HEP  Self Care: Continue using of heating pad for symptom management   PATIENT EDUCATION: Education details: Eval findings, POC, HEP, self care  Person educated: Patient Education method: Explanation, Demonstration, Tactile cues, Verbal cues, and Handouts Education comprehension: verbalized understanding, returned demonstration, verbal cues required, and tactile cues required  HOME EXERCISE PROGRAM: Access Code: [email or web address] URL: [email or web address] Date: 02/07/2024 Prepared by: Dasie Daft  Exercises - Standing Shoulder Posterior Capsule Stretch  - 2 x daily - 7 x weekly - 1 sets - 3 reps - 30 hold - Standing Thoracic Open Book at Wall  - 1 x daily - 7 x  weekly - 3 sets - 5 reps - 5 hold - Seated Flexion Stretch with Swiss Ball  - 2 x daily - 7 x weekly - 1 sets - 10 reps - 5-20 hold - Seated Upper Trapezius Stretch (Mirrored)  - 2 x daily - 7 x weekly - 1 sets - 3 reps - 15 hold - Standing Shoulder Row with Anchored Resistance  - 1 x daily - 7 x weekly - 2 sets - 10 reps - 3 hold - Shoulder extension with resistance - Neutral  - 1 x daily - 7 x weekly - 2 sets - 10 reps - 3 hold - Shoulder External Rotation and Scapular Retraction with Resistance  - 1 x daily - 7 x weekly - 2 sets - 10 reps - 3 hold  ASSESSMENT:  CLINICAL IMPRESSION: Pt arrived 15 mins late due to confusion with her appt time. Treatment time was therefore limited. Pt has completed 4 of 12 approved visits by her insurance. PT was completed for exs to address posterior L upper quarter flexibility and strength. With reassessment, the L shoulder AROM continues to be equal to the R, 130d, with some degree of improved quality of movement, ie trunk compensations with L shoulder flexion appear decreased in comparison to the eval. Pt tolerated prescribed exs in PT today without an increase in pain. Pt will continue to benefit from skilled PT 1-2x per week for 6 weeks to address impairments for improved L shoulder function with minimized pain.   EVAL: Patient is a 44 y.o. female who was seen today for physical therapy evaluation and treatment for D17.1 (ICD-10-CM) - Lipoma of back. 1 x week x 4 weeks, Scapular ROM and lower trap strength. Pt presents with significant L interscapular mid back pain and moderate L mid and lower scapula weakness. Otherwise, the AROM and strength of the L shoulder were found equal to the R. With AROMs of the L shoulder, pt demonstrated irregular trunk movements to complete the L arm motions. The trunk compensations appeared related to pain vs strength. Pt's Quick Dash self functional assessment indicates she perceives she is experiencing a high level of disability  with her L shoulder girdle. A HEP was started. Pt will benefit from skilled PT 1w4 to address impairments to optimize function with less pain.    OBJECTIVE IMPAIRMENTS: decreased activity tolerance, decreased strength, increased muscle spasms, and pain.   ACTIVITY LIMITATIONS: carrying, lifting, bathing, toileting, dressing, reach over head, hygiene/grooming, and caring for others  PARTICIPATION LIMITATIONS: meal prep, cleaning, laundry, community activity, and occupation  PERSONAL FACTORS: Past/current experiences, Social background, and 1 comorbidity: cervical disc issue c a pinched nerve down R UE are also affecting patient's functional outcome.   REHAB POTENTIAL: Good  CLINICAL DECISION MAKING: Evolving/moderate complexity  EVALUATION COMPLEXITY:  Moderate  GOALS:  SHORT TERM GOALS: Target date: 02/22/24  Pt will be Ind in an initial HEP  Baseline:started Goal status: Ongoing  2.  Pt will report 25% or greater improvement in pain for improved function and QOL Baseline: 7-10/10 03/05/24: No significant change Goal status: Not met  LONG TERM GOALS: Target date: 04/23/24  Pt will be Ind in a final HEP to maintain achieved LOF  Baseline: started Goal status: INITIAL  2.  Pt will report 50% or greater improvement in pain for improved function and QOL Baseline: 7-10/20 Goal status: INITIAL  3.  Increase mid and lower scapula strength for improved function and quality of mobility Baseline: 3/5 with pain Goal status: INITIAL  4.  Pt will be able to left 3# for improved function with household activities Baseline: NT Goal status: INITIAL  5.  Pt's Quick Dash score will improve by the MCID to 60% as indication of improved function  Baseline:75% disability  Goal status: INITIAL  PLAN: PT FREQUENCY: 1-2x per wk  PT DURATION: 6 weeks  PLANNED INTERVENTIONS: 97164- PT Re-evaluation, 97110-Therapeutic exercises, 97530- Therapeutic activity, 97112- Neuromuscular re-education,  97535- Self Care, 02859- Manual therapy, [id number]- Aquatic Therapy, H9716- Electrical stimulation (unattended), Patient/Family education, Taping, Joint mobilization, Cryotherapy, and Moist heat  PLAN FOR NEXT SESSION: Assess response to HEP; progress therex as indicated; use of modalities, and manual therapy as indicated.   Kaeleigh Westendorf MS, PT 03/05/2024 5:41 PM     "

## 2024-03-05 ENCOUNTER — Ambulatory Visit: Attending: Surgical

## 2024-03-05 DIAGNOSIS — M546 Pain in thoracic spine: Secondary | ICD-10-CM | POA: Diagnosis present

## 2024-03-05 DIAGNOSIS — M6281 Muscle weakness (generalized): Secondary | ICD-10-CM | POA: Insufficient documentation

## 2024-03-07 ENCOUNTER — Ambulatory Visit: Admitting: Surgical

## 2024-03-07 ENCOUNTER — Encounter: Payer: Self-pay | Admitting: Surgical

## 2024-03-07 DIAGNOSIS — M1712 Unilateral primary osteoarthritis, left knee: Secondary | ICD-10-CM | POA: Diagnosis not present

## 2024-03-07 DIAGNOSIS — D171 Benign lipomatous neoplasm of skin and subcutaneous tissue of trunk: Secondary | ICD-10-CM | POA: Diagnosis not present

## 2024-03-07 MED ORDER — METHYLPREDNISOLONE ACETATE 40 MG/ML IJ SUSP
40.0000 mg | INTRAMUSCULAR | Status: AC | PRN
  Administered 2024-03-07: 40 mg via INTRA_ARTICULAR

## 2024-03-07 MED ORDER — BUPIVACAINE HCL 0.25 % IJ SOLN
4.0000 mL | INTRAMUSCULAR | Status: AC | PRN
  Administered 2024-03-07: 4 mL via INTRA_ARTICULAR

## 2024-03-07 MED ORDER — LIDOCAINE HCL 1 % IJ SOLN
5.0000 mL | INTRAMUSCULAR | Status: AC | PRN
  Administered 2024-03-07: 5 mL

## 2024-03-07 NOTE — Progress Notes (Signed)
 "  Office Visit Note   Patient: Katie Benson           Date of Birth: Sep 09, 1980           MRN: [id number] Visit Date: 03/07/2024 Requested by: No referring provider defined for this encounter. PCP: Patient, No Pcp Per  Subjective: Chief Complaint  Patient presents with   Left Knee - Follow-up   Middle Back - Follow-up    HPI: Katie Benson is a 44 y.o. female who presents to the office reporting knee pain.  Has history of knee arthritis.  No new falls or injuries.  No fevers or chills.  Previous injections have provided good relief and they are here today to repeat injections.  Continues to have more lateral sided knee pain.  Describes pain along the lateral joint line as well as more distally and proximally that runs along the IT band..                ROS: All systems reviewed are negative as they relate to the chief complaint within the history of present illness.  Patient denies fevers or chills.  Assessment & Plan: Visit Diagnoses:  1. Unilateral primary osteoarthritis, left knee   2. Lipoma of back     Plan: Impression is 44 year old female who presents for left knee pain.  She has history of left knee lateral compartment arthritis.  Plan for intra-articular injection today.  Tolerated procedure well.  She may have some component of iliotibial band syndrome that is contributing to her lateral sided knee pain.  She will try some TFL strengthening exercises and see if this improves her symptoms to some degree.  If no improvement, could try shockwave therapy or formal physical therapy.  She is currently in PT for her scapular mobility and strengthening and feels that this is going well with increase strength but continued sensitivity.  Plan to follow-up in late February for evaluation of her current scapular function.  Follow-Up Instructions: No follow-ups on file.   Orders:  No orders of the defined types were placed in this encounter.  No orders of the defined  types were placed in this encounter.     Procedures: Large Joint Inj: L knee on 03/07/2024 10:50 AM Indications: diagnostic evaluation, joint swelling and pain Details: 18 G 1.5 in needle, superolateral approach  Arthrogram: No  Medications: 5 mL lidocaine  1 %; 4 mL bupivacaine  0.25 %; 40 mg methylPREDNISolone  acetate 40 MG/ML Outcome: tolerated well, no immediate complications Procedure, treatment alternatives, risks and benefits explained, specific risks discussed. Consent was given by the patient. Immediately prior to procedure a time out was called to verify the correct patient, procedure, equipment, support staff and site/side marked as required. Patient was prepped and draped in the usual sterile fashion.       Clinical Data: No additional findings.  Objective: Vital Signs: There were no vitals taken for this visit.  Physical Exam:  Constitutional: Patient appears well-developed HEENT:  Head: Normocephalic Eyes:EOM are normal Neck: Normal range of motion Cardiovascular: Normal rate Pulmonary/chest: Effort normal Neurologic: Patient is alert Skin: Skin is warm Psychiatric: Patient has normal mood and affect  Ortho Exam: Ortho exam demonstrates knees without cellulitis or skin changes.  Effusion not present.  No calf tenderness.  Negative Homans' sign.  No pain with hip range of motion.  Able to perform straight leg raise with both lower extremities.  Lower extremities warm and well-perfused.  Tender over Gerdy's tubercle and reproduced with positive  Ober sign.  Specialty Comments:  MRI CERVICAL SPINE WITHOUT CONTRAST   TECHNIQUE: Multiplanar, multisequence MR imaging of the cervical spine was performed. No intravenous contrast was administered.   COMPARISON:  CT scan 12/26/2012   FINDINGS: Alignment: No vertebral subluxation is observed.   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 Disc desiccation at all levels between C2 and C7.   Cord: No  significant abnormal spinal cord signal is observed.   Posterior Fossa, vertebral arteries, paraspinal tissues: Unremarkable   Disc levels:   C2-3: Unremarkable.   C3-4: Borderline bilateral foraminal stenosis due to disc bulge and uncinate spurring.   C4-5: Moderate to prominent bilateral foraminal stenosis and borderline central narrowing of the thecal sac due to disc bulge and uncinate spurring.   C5-6: Moderate right and moderate to prominent left foraminal stenosis and borderline central narrowing of the thecal sac due to disc bulge and uncinate spurring.   C6-7: Mild right and moderate left foraminal stenosis due to disc bulge and uncinate spurring.   C7-T1: No impingement.  Minimal uncinate spurring.   T1-2: Unremarkable.   IMPRESSION: 1. Cervical spondylosis, degenerative disc disease, and mildly congenitally short pedicles contribute to moderate to prominent impingement at C4-5; moderate impingement at C5-6; and mild impingement at C6-7, as detailed above.     Electronically Signed   By: Ryan Salvage M.D.   On: 08/01/2020 09:25  ---------------------------------------------- EXAM: MRI LUMBAR SPINE WITHOUT CONTRAST   TECHNIQUE: Multiplanar, multisequence MR imaging of the lumbar spine was performed. No intravenous contrast was administered.   COMPARISON:  Lumbar spine x-rays dated June 23, 2021. CT abdomen pelvis dated March 26, 2020.   FINDINGS: Segmentation:  Standard.   Alignment:  Physiologic.   Vertebrae:  No fracture, evidence of discitis, or bone lesion.   Conus medullaris and cauda equina: Conus extends to the L1-L2 level. Conus and cauda equina appear normal.   Paraspinal and other soft tissues: Negative.   Disc levels:   T11-T12 to L2-L3: Negative.   L3-L4: Mild disc bulging and bilateral facet arthropathy. Mild bilateral neuroforaminal stenosis. No spinal canal stenosis.   L4-L5: Negative disc. Mild bilateral facet  arthropathy. Mild bilateral neuroforaminal stenosis. No spinal canal stenosis.   L5-S1: Negative disc. Mild bilateral facet arthropathy. No stenosis.   IMPRESSION: 1. Mild degenerative changes of the lower lumbar spine as described above. No high-grade stenosis or impingement.     Electronically Signed   By: Elsie ONEIDA Shoulder M.D.   On: 08/19/2021 16:36  Imaging: No results found.   PMFS History: Patient Active Problem List   Diagnosis Date Noted   Lipoma of back 10/21/2023   History of esophageal ulcer 12/22/2021   Ingrown nail of fifth toe 12/22/2021   Chronic toe pain, left foot 12/22/2021   Blurry vision, bilateral 12/22/2021   Left corneal abrasion 12/22/2021   Osteoarthritis of left knee 12/14/2020   Subacromial bursitis of right shoulder joint 07/15/2020   Cervical radiculopathy 06/24/2020   Effusion, left knee 06/24/2019   Pes anserine bursitis 04/29/2019   Bucket handle tear of lateral meniscus of left knee 12/12/2018   Ganglion cyst 12/12/2018   Fibroids 03/18/2018   S/P parathyroidectomy 02/12/2018   Primary hyperparathyroidism 01/08/2018   Hyperparathyroidism , secondary, non-renal 01/08/2018   Fibroid uterus 12/06/2017   Hypercalcemia 12/06/2017   Fibroid 10/15/2017   Abnormal uterine bleeding (AUB) 08/20/2017   Anemia 08/20/2017   Past Medical History:  Diagnosis Date   Anemia    Arthritis  Fibroids    GERD (gastroesophageal reflux disease)    History of kidney stones    Neck injury     Family History  Problem Relation Age of Onset   Diabetes Sister    Cancer Maternal Grandmother        ovarian   Hypercalcemia Mother    Other Father        HX Unknown    Past Surgical History:  Procedure Laterality Date   CESAREAN SECTION     EXCISION MASS, BACK Left 10/04/2023   Procedure: EXCISION MASS, BACK;  Surgeon: [name] Cordella Hamilton, MD;  Location: [facility];  Service: Orthopedics;  Laterality: Left;  left shoulder lipoma removal    IR ANGIOGRAM PELVIS SELECTIVE OR SUPRASELECTIVE  03/18/2018   IR ANGIOGRAM PELVIS SELECTIVE OR SUPRASELECTIVE  03/18/2018   IR ANGIOGRAM SELECTIVE EACH ADDITIONAL VESSEL  03/18/2018   IR ANGIOGRAM SELECTIVE EACH ADDITIONAL VESSEL  03/18/2018   IR EMBO TUMOR ORGAN ISCHEMIA INFARCT INC GUIDE ROADMAPPING  03/18/2018   IR RADIOLOGIST EVAL & MGMT  10/17/2017   IR RADIOLOGIST EVAL & MGMT  12/20/2017   IR RADIOLOGIST EVAL & MGMT  04/17/2018   IR US  GUIDE VASC ACCESS RIGHT  03/18/2018   KNEE ARTHROSCOPY WITH LATERAL MENISECTOMY Left 12/24/2018   Procedure: LEFT KNEE ARTHROSCOPY WITH LATERAL MENISECTOM, debriment of GANGLION CYST;  Surgeon: Anderson Maude ORN, MD;  Location: [facility];  Service: Orthopedics;  Laterality: Left;   PARATHYROIDECTOMY N/A 02/12/2018   Procedure: PARATHYROIDECTOMY;  Surgeon: Marolyn Nest, MD;  Location: [facility];  Service: General;  Laterality: N/A;   WISDOM TOOTH EXTRACTION     age 57   Social History   Occupational History   Occupation: unemployed  Tobacco Use   Smoking status: Some Days    Current packs/day: 0.25    Average packs/day: 0.3 packs/day for 14.0 years (3.5 ttl pk-yrs)    Types: Cigarettes   Smokeless tobacco: Never  Vaping Use   Vaping status: Never Used  Substance and Sexual Activity   Alcohol use: Yes    Comment: occassionally on weekends   Drug use: Yes    Types: Marijuana    Comment: multiple times a day   Sexual activity: Yes    Partners: Male    Birth control/protection: Condom         "

## 2024-03-11 NOTE — Therapy (Signed)
 " OUTPATIENT PHYSICAL THERAPY UPPER EXTREMITY TREATMENT/ReCert   Patient Name: Katie Benson MRN: [id number] DOB:June 20, 1980, 44 y.o., female Today's Date: 03/11/2024  END OF SESSION:       Past Medical History:  Diagnosis Date   Anemia    Arthritis    Fibroids    GERD (gastroesophageal reflux disease)    History of kidney stones    Neck injury    Past Surgical History:  Procedure Laterality Date   CESAREAN SECTION     EXCISION MASS, BACK Left 10/04/2023   Procedure: EXCISION MASS, BACK;  Surgeon: [name] Cordella Hamilton, MD;  Location: [facility];  Service: Orthopedics;  Laterality: Left;  left shoulder lipoma removal   IR ANGIOGRAM PELVIS SELECTIVE OR SUPRASELECTIVE  03/18/2018   IR ANGIOGRAM PELVIS SELECTIVE OR SUPRASELECTIVE  03/18/2018   IR ANGIOGRAM SELECTIVE EACH ADDITIONAL VESSEL  03/18/2018   IR ANGIOGRAM SELECTIVE EACH ADDITIONAL VESSEL  03/18/2018   IR EMBO TUMOR ORGAN ISCHEMIA INFARCT INC GUIDE ROADMAPPING  03/18/2018   IR RADIOLOGIST EVAL & MGMT  10/17/2017   IR RADIOLOGIST EVAL & MGMT  12/20/2017   IR RADIOLOGIST EVAL & MGMT  04/17/2018   IR US  GUIDE VASC ACCESS RIGHT  03/18/2018   KNEE ARTHROSCOPY WITH LATERAL MENISECTOMY Left 12/24/2018   Procedure: LEFT KNEE ARTHROSCOPY WITH LATERAL MENISECTOM, debriment of GANGLION CYST;  Surgeon: Anderson Maude ORN, MD;  Location: [facility];  Service: Orthopedics;  Laterality: Left;   PARATHYROIDECTOMY N/A 02/12/2018   Procedure: PARATHYROIDECTOMY;  Surgeon: Marolyn Nest, MD;  Location: [facility];  Service: General;  Laterality: N/A;   WISDOM TOOTH EXTRACTION     age 61   Patient Active Problem List   Diagnosis Date Noted   Lipoma of back 10/21/2023   History of esophageal ulcer 12/22/2021   Ingrown nail of fifth toe 12/22/2021   Chronic toe pain, left foot 12/22/2021   Blurry vision, bilateral 12/22/2021   Left corneal abrasion 12/22/2021   Osteoarthritis of left knee 12/14/2020   Subacromial bursitis of  right shoulder joint 07/15/2020   Cervical radiculopathy 06/24/2020   Effusion, left knee 06/24/2019   Pes anserine bursitis 04/29/2019   Bucket handle tear of lateral meniscus of left knee 12/12/2018   Ganglion cyst 12/12/2018   Fibroids 03/18/2018   S/P parathyroidectomy 02/12/2018   Primary hyperparathyroidism 01/08/2018   Hyperparathyroidism , secondary, non-renal 01/08/2018   Fibroid uterus 12/06/2017   Hypercalcemia 12/06/2017   Fibroid 10/15/2017   Abnormal uterine bleeding (AUB) 08/20/2017   Anemia 08/20/2017    PCP: No PCP  REFERRING PROVIDER: Shirly Carlin CROME, PA-C   REFERRING DIAG: D17.1 (ICD-10-CM) - Lipoma of back   THERAPY DIAG:  No diagnosis found.  Rationale for Evaluation and Treatment: Rehabilitation  ONSET DATE: 10/04/23 Sx  SUBJECTIVE:  SUBJECTIVE STATEMENT: Pt reports her pain is about the same, but feels she is moving her L arm some better.   EVAL: Pt reports she is experiencing L mid back pain following Sx to remove a lipoma on 10/04/23. Prior to surgery, she notes having pain in this area esp when her back pressed into the bed or back of a chair.  She states being told the surgery was more extensive than expected do to the lipoma's size and being interconnected to the muscle.  Hand dominance: Right  PERTINENT HISTORY: Pt reports cervical disc issue c a pinch nerve down the R UE and L knee pain  PN note from Charles L. Magnant PA-C 12/24/23 Plan: Patient is a 44 year old female who presents s/p excision of lipoma from upper back on 10/04/2023.  Here today for incision recheck primarily.  Incision is well-healed without evidence of infection or dehiscence.  Patient does complain of slightly increased pain in the shoulder blade region along with muscle spasms in this area.  She  has difficulty sleeping.  Still very sensitive to touch in this area and she has pain that bothers her in the upper trapezius that extends into the lower trap region along with some pain in the latissimus area.  Flexeril  helps.  Gabapentin  helps.   On exam, patient has incision that is well-healed.  Intact shrugging of the shoulder.  Excellent rotator cuff strength of the left arm.  She has some weakness of lower trapezius musculature.  This seems secondary to pain.  Intact EPL, FPL, finger abduction of the left arm.   Plan at this time is refill Flexeril .  We will have her start physical therapy on [facility] for scapular mobility and lower trapezius strengthening exercises.  See her back in 6 weeks for clinical recheck.    PAIN:  Are you having pain? Yes: NPRS scale: 7-10/10. Current: 10+/10 Pain location: L interscapular/mid back area Pain description: throb, tight, spasms, sharp Aggravating factors: The use of the L upper quarter- symptoms increase c actively; sleep Relieving factors: Relaxing, heating pad  PRECAUTIONS: None  RED FLAGS: None   WEIGHT BEARING RESTRICTIONS: No  FALLS:  Has patient fallen in last 6 months? 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  DIAGNOSTIC FINDINGS:  See Epic and MD notes  PATIENT SURVEYS :  Quick Dash: 75% disability  Minimally Clinically Important Difference (MCID): 15-20 points  COGNITION: Overall cognitive status: Within functional limits for tasks assessed     SENSATION: Not tested  POSTURE: Forward head and shoulders  UPPER EXTREMITY ROM:   L shoulder AROMs were equal to the R. With all  movements pt reported increased pain and pt utilized irregular trunk movements to complete. Active ROM Right eval Left eval  Shoulder flexion 130 130  Shoulder extension    Shoulder abduction    Shoulder adduction    Shoulder internal rotation T11 T11  Shoulder external rotation T4 T4  Elbow flexion    Elbow extension    Wrist flexion    Wrist extension    Wrist ulnar deviation    Wrist radial deviation    Wrist pronation    Wrist supination    (Blank  rows = not tested)  UPPER EXTREMITY MMT:  MMT Right eval Left eval  Shoulder flexion 4+ 4+ p  Shoulder extension    Shoulder abduction 4+ 4+ p  Shoulder adduction    Shoulder internal rotation 5 5 p  Shoulder external rotation 5 5 p  Middle trapezius 4 3 p  Lower trapezius 4 3 p  Elbow flexion 5 5  Elbow extension 5 5  Wrist flexion    Wrist extension    Wrist ulnar deviation    Wrist radial deviation    Wrist pronation    Wrist supination    Grip strength (lbs) 83,85=84 68, 70=69  (Blank rows = not tested)  SHOULDER SPECIAL TESTS: Impingement tests: NT SLAP lesions: NT Instability tests: NT Rotator cuff assessment: Drop arm test: negative and Full can test: negative Biceps assessment: NT  PALPATION:  Significantly TTP to the L interscapula area                                                                                                                  TREATMENT DATE:  [facility] Adult PT Treatment:                                                DATE: 03/13/24 Therapeutic Exercise: Shoulder/upper quadrant fwd and lateral c Pball Shoulder flex wall slides 2x10 Standing Shoulder Row RTB 2x10 Shoulder extension YTB 2x10 Shoulder ER YTB 2x10 Updated HEP Therapeutic Exercise: *** Manual Therapy: *** Neuromuscular re-ed: *** Therapeutic Activity: *** Modalities: *** Self Care: ***  [facility] Adult PT Treatment:                                                DATE: 03/06/23 Therapeutic Exercise: Shoulder/upper  quadrant fwd and lateral c Pball Shoulder flex wall slides 2x10 Standing Shoulder Row RTB 2x10 Shoulder extension YTB 2x10 Shoulder ER YTB 2x10 Updated HEP  [facility] Adult PT Treatment:                                                DATE: 02/19/24 Therapeutic Exercise: Standing Shoulder Row RTB 2x10 Shoulder extension YTB 2x10 Shoulder ER YTB 2x10 Shoulder flexion RTB x10 Updated HEP Manual Therapy: STM to the L interscapular, upper trap, and levator Self Care: Instruction in self massage c control using a theracane and tennis ball on wall   [facility] Adult PT Treatment:  DATE: 02/07/24 Therapeutic Exercise: Standing Shoulder Posterior Capsule Stretch trunk rotation x3 30 Seated roll outs fwd and lat c Pball Seated Upper Trapezius Stretch (Mirrored) x3 15 Standing Shoulder Row RTB 2x10 Shoulder extension YTB 2x10 Shoulder ER YTB 2x10 Shoulder flexion RTB x10 Updated HEP  [facility] Adult PT Treatment:                                                DATE: 01/29/24 Therapeutic Exercise: Developed, instructed in, and pt completed therex as noted in HEP  Self Care: Continue using of heating pad for symptom management   PATIENT EDUCATION: Education details: Eval findings, POC, HEP, self care  Person educated: Patient Education method: Explanation, Demonstration, Tactile cues, Verbal cues, and Handouts Education comprehension: verbalized understanding, returned demonstration, verbal cues required, and tactile cues required  HOME EXERCISE PROGRAM: Access Code: [email or web address] URL: [email or web address] Date: 02/07/2024 Prepared by: Dasie Daft  Exercises - Standing Shoulder Posterior Capsule Stretch  - 2 x daily - 7 x weekly - 1 sets - 3 reps - 30 hold - Standing Thoracic Open Book at Wall  - 1 x daily - 7 x weekly - 3 sets - 5 reps - 5 hold - Seated Flexion Stretch with Swiss Ball  - 2 x daily - 7 x weekly - 1 sets - 10 reps - 5-20  hold - Seated Upper Trapezius Stretch (Mirrored)  - 2 x daily - 7 x weekly - 1 sets - 3 reps - 15 hold - Standing Shoulder Row with Anchored Resistance  - 1 x daily - 7 x weekly - 2 sets - 10 reps - 3 hold - Shoulder extension with resistance - Neutral  - 1 x daily - 7 x weekly - 2 sets - 10 reps - 3 hold - Shoulder External Rotation and Scapular Retraction with Resistance  - 1 x daily - 7 x weekly - 2 sets - 10 reps - 3 hold  ASSESSMENT:  CLINICAL IMPRESSION: Pt arrived 15 mins late due to confusion with her appt time. Treatment time was therefore limited. Pt has completed 4 of 12 approved visits by her insurance. PT was completed for exs to address posterior L upper quarter flexibility and strength. With reassessment, the L shoulder AROM continues to be equal to the R, 130d, with some degree of improved quality of movement, ie trunk compensations with L shoulder flexion appear decreased in comparison to the eval. Pt tolerated prescribed exs in PT today without an increase in pain. Pt will continue to benefit from skilled PT 1-2x per week for 6 weeks to address impairments for improved L shoulder function with minimized pain.   EVAL: Patient is a 44 y.o. female who was seen today for physical therapy evaluation and treatment for D17.1 (ICD-10-CM) - Lipoma of back. 1 x week x 4 weeks, Scapular ROM and lower trap strength. Pt presents with significant L interscapular mid back pain and moderate L mid and lower scapula weakness. Otherwise, the AROM and strength of the L shoulder were found equal to the R. With AROMs of the L shoulder, pt demonstrated irregular trunk movements to complete the L arm motions. The trunk compensations appeared related to pain vs strength. Pt's Quick Dash self functional assessment indicates she perceives she is experiencing a high level of disability with her L shoulder girdle. A HEP was started. Pt  will benefit from skilled PT 1w4 to address impairments to optimize function with  less pain.    OBJECTIVE IMPAIRMENTS: decreased activity tolerance, decreased strength, increased muscle spasms, and pain.   ACTIVITY LIMITATIONS: carrying, lifting, bathing, toileting, dressing, reach over head, hygiene/grooming, and caring for others  PARTICIPATION LIMITATIONS: meal prep, cleaning, laundry, community activity, and occupation  PERSONAL FACTORS: Past/current experiences, Social background, and 1 comorbidity: cervical disc issue c a pinched nerve down R UE are also affecting patient's functional outcome.   REHAB POTENTIAL: Good  CLINICAL DECISION MAKING: Evolving/moderate complexity  EVALUATION COMPLEXITY: Moderate  GOALS:  SHORT TERM GOALS: Target date: 02/22/24  Pt will be Ind in an initial HEP  Baseline:started Goal status: Ongoing  2.  Pt will report 25% or greater improvement in pain for improved function and QOL Baseline: 7-10/10 03/05/24: No significant change Goal status: Not met  LONG TERM GOALS: Target date: 04/23/24  Pt will be Ind in a final HEP to maintain achieved LOF  Baseline: started Goal status: INITIAL  2.  Pt will report 50% or greater improvement in pain for improved function and QOL Baseline: 7-10/20 Goal status: INITIAL  3.  Increase mid and lower scapula strength for improved function and quality of mobility Baseline: 3/5 with pain Goal status: INITIAL  4.  Pt will be able to left 3# for improved function with household activities Baseline: NT Goal status: INITIAL  5.  Pt's Quick Dash score will improve by the MCID to 60% as indication of improved function  Baseline:75% disability  Goal status: INITIAL  PLAN: PT FREQUENCY: 1-2x per wk  PT DURATION: 6 weeks  PLANNED INTERVENTIONS: 97164- PT Re-evaluation, 97110-Therapeutic exercises, 97530- Therapeutic activity, 97112- Neuromuscular re-education, 97535- Self Care, [id number]- Manual therapy, [id number]- Aquatic Therapy, [id number]- Electrical stimulation (unattended), Patient/Family  education, Taping, Joint mobilization, Cryotherapy, and Moist heat  PLAN FOR NEXT SESSION: Assess response to HEP; progress therex as indicated; use of modalities, and manual therapy as indicated.   Seletha Zimmermann MS, PT 03/11/2024 4:06 PM     "

## 2024-03-13 ENCOUNTER — Ambulatory Visit

## 2024-03-13 DIAGNOSIS — M546 Pain in thoracic spine: Secondary | ICD-10-CM

## 2024-03-13 DIAGNOSIS — M6281 Muscle weakness (generalized): Secondary | ICD-10-CM

## 2024-03-19 ENCOUNTER — Ambulatory Visit

## 2024-03-19 DIAGNOSIS — M6281 Muscle weakness (generalized): Secondary | ICD-10-CM

## 2024-03-19 DIAGNOSIS — M546 Pain in thoracic spine: Secondary | ICD-10-CM

## 2024-03-19 NOTE — Therapy (Signed)
 " OUTPATIENT PHYSICAL THERAPY UPPER EXTREMITY TREATMENT   Patient Name: Katie Benson MRN: [id number] DOB:[birth year], 44 y.o., female Today's Date: 03/19/2024  END OF SESSION:  PT End of Session - 03/19/24 1507     Visit Number 6    Number of Visits 12    Date for Recertification  04/23/24    Authorization Type [place] MEDICAID HEALTHY BLUE    Authorization Time Period approved 12 PT visits from 01/29/24-04/27/24    Authorization - Visit Number 6    Authorization - Number of Visits 12    PT Start Time 1508    PT Stop Time 1548    PT Time Calculation (min) 40 min    Activity Tolerance Patient limited by pain    Behavior During Therapy WFL for tasks assessed/performed               Past Medical History:  Diagnosis Date   Anemia    Arthritis    Fibroids    GERD (gastroesophageal reflux disease)    History of kidney stones    Neck injury    Past Surgical History:  Procedure Laterality Date   CESAREAN SECTION     EXCISION MASS, BACK Left 10/04/2023   Procedure: EXCISION MASS, BACK;  Surgeon: [name] Cordella Hamilton, MD;  Location: [facility];  Service: Orthopedics;  Laterality: Left;  left shoulder lipoma removal   IR ANGIOGRAM PELVIS SELECTIVE OR SUPRASELECTIVE  03/18/2018   IR ANGIOGRAM PELVIS SELECTIVE OR SUPRASELECTIVE  03/18/2018   IR ANGIOGRAM SELECTIVE EACH ADDITIONAL VESSEL  03/18/2018   IR ANGIOGRAM SELECTIVE EACH ADDITIONAL VESSEL  03/18/2018   IR EMBO TUMOR ORGAN ISCHEMIA INFARCT INC GUIDE ROADMAPPING  03/18/2018   IR RADIOLOGIST EVAL & MGMT  10/17/2017   IR RADIOLOGIST EVAL & MGMT  12/20/2017   IR RADIOLOGIST EVAL & MGMT  04/17/2018   IR US  GUIDE VASC ACCESS RIGHT  03/18/2018   KNEE ARTHROSCOPY WITH LATERAL MENISECTOMY Left 12/24/2018   Procedure: LEFT KNEE ARTHROSCOPY WITH LATERAL MENISECTOM, debriment of GANGLION CYST;  Surgeon: Anderson Maude ORN, MD;  Location: [facility];  Service: Orthopedics;  Laterality: Left;   PARATHYROIDECTOMY N/A 02/12/2018    Procedure: PARATHYROIDECTOMY;  Surgeon: Marolyn Nest, MD;  Location: [facility];  Service: General;  Laterality: N/A;   WISDOM TOOTH EXTRACTION     age 31   Patient Active Problem List   Diagnosis Date Noted   Lipoma of back 10/21/2023   History of esophageal ulcer 12/22/2021   Ingrown nail of fifth toe 12/22/2021   Chronic toe pain, left foot 12/22/2021   Blurry vision, bilateral 12/22/2021   Left corneal abrasion 12/22/2021   Osteoarthritis of left knee 12/14/2020   Subacromial bursitis of right shoulder joint 07/15/2020   Cervical radiculopathy 06/24/2020   Effusion, left knee 06/24/2019   Pes anserine bursitis 04/29/2019   Bucket handle tear of lateral meniscus of left knee 12/12/2018   Ganglion cyst 12/12/2018   Fibroids 03/18/2018   S/P parathyroidectomy 02/12/2018   Primary hyperparathyroidism 01/08/2018   Hyperparathyroidism , secondary, non-renal 01/08/2018   Fibroid uterus 12/06/2017   Hypercalcemia 12/06/2017   Fibroid 10/15/2017   Abnormal uterine bleeding (AUB) 08/20/2017   Anemia 08/20/2017    PCP: No PCP  REFERRING PROVIDER: Shirly Carlin CROME, PA-C   REFERRING DIAG: D17.1 (ICD-10-CM) - Lipoma of back   THERAPY DIAG:  Pain in thoracic spine  Muscle weakness (generalized)  Rationale for Evaluation and Treatment: Rehabilitation  ONSET DATE: 10/04/23 Sx  SUBJECTIVE:  SUBJECTIVE STATEMENT: Pt reports she has not been able to try the densensitization since the last PT appt. She notes no one in her home will help her do so. Pt feels her R low back is bothering her more with compensating for L upper back/shoulder pain.   EVAL: Pt reports she is experiencing L mid back pain following Sx to remove a lipoma on 10/04/23. Prior to surgery, she notes having pain in this area esp when her  back pressed into the bed or back of a chair.  She states being told the surgery was more extensive than expected do to the lipoma's size and being interconnected to the muscle.  Hand dominance: Right  PERTINENT HISTORY: Pt reports cervical disc issue c a pinch nerve down the R UE and L knee pain  PN note from Charles L. Magnant PA-C 12/24/23 Plan: Patient is a 44 year old female who presents s/p excision of lipoma from upper back on 10/04/2023.  Here today for incision recheck primarily.  Incision is well-healed without evidence of infection or dehiscence.  Patient does complain of slightly increased pain in the shoulder blade region along with muscle spasms in this area.  She has difficulty sleeping.  Still very sensitive to touch in this area and she has pain that bothers her in the upper trapezius that extends into the lower trap region along with some pain in the latissimus area.  Flexeril  helps.  Gabapentin  helps.   On exam, patient has incision that is well-healed.  Intact shrugging of the shoulder.  Excellent rotator cuff strength of the left arm.  She has some weakness of lower trapezius musculature.  This seems secondary to pain.  Intact EPL, FPL, finger abduction of the left arm.   Plan at this time is refill Flexeril .  We will have her start physical therapy on [facility] for scapular mobility and lower trapezius strengthening exercises.  See her back in 6 weeks for clinical recheck.    PAIN:  Are you having pain? Yes: NPRS scale: 7-10/10. Current: 10+/10 Pain location: L interscapular/mid back area Pain description: throb, tight, spasms, sharp Aggravating factors: The use of the L upper quarter- symptoms increase c actively; sleep Relieving factors: Relaxing, heating pad  PRECAUTIONS: None  RED FLAGS: None   WEIGHT BEARING RESTRICTIONS: No  FALLS:  Has patient fallen in last 6 months? 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  DIAGNOSTIC FINDINGS:  See Epic and MD notes  PATIENT SURVEYS :  Quick Dash: 75% disability  Minimally Clinically Important Difference (MCID): 15-20 points  COGNITION: Overall cognitive status: Within functional limits for tasks assessed     SENSATION: Not tested  POSTURE: Forward head and shoulders  UPPER EXTREMITY ROM:   L shoulder AROMs were equal to the R. With all movements pt reported increased pain and pt utilized irregular trunk movements to complete. Active ROM Right eval Left eval  Shoulder flexion 130 130  Shoulder extension    Shoulder abduction    Shoulder adduction    Shoulder internal rotation T11 T11  Shoulder external rotation T4 T4  Elbow flexion    Elbow extension    Wrist flexion    Wrist  extension    Wrist ulnar deviation    Wrist radial deviation    Wrist pronation    Wrist supination    (Blank rows = not tested)  UPPER EXTREMITY MMT:  MMT Right eval Left eval  Shoulder flexion 4+ 4+ p  Shoulder extension    Shoulder abduction 4+ 4+ p  Shoulder adduction    Shoulder internal rotation 5 5 p  Shoulder external rotation 5 5 p  Middle trapezius 4 3 p  Lower trapezius 4 3 p  Elbow flexion 5 5  Elbow extension 5 5  Wrist flexion    Wrist extension    Wrist ulnar deviation    Wrist radial deviation    Wrist pronation    Wrist supination    Grip strength (lbs) 83,85=84 68, 70=69  (Blank rows = not tested)  SHOULDER SPECIAL TESTS: Impingement tests: NT SLAP lesions: NT Instability tests: NT Rotator cuff assessment: Drop arm test: negative and Full can test: negative Biceps assessment: NT  PALPATION:  Significantly TTP to  the L interscapula area                                                                                                                  TREATMENT DATE:  [facility] Adult PT Treatment:                                                DATE: 03/19/24 Therapeutic Exercise: UBE 4 mins FWD/BWD L2 Low shoulder rows 20# 2x10 60d pectoral stretch L x2 30 The following exs were limited to the L UE with the pt reporting irritation of the R UE (she reports Hx of nerve damage) with previous exs L shoulder flexion to 90d 2x10 2# L Shoulder ER RTB 2x10 L Shoulder ext RTB 2x10 Modalities: PT attended PreMod estim c crossing 4 electrode pattern c 2 channels to assess pt's potential benefit with using a TENs unit at home, intensity 8v each channel Self Care: Instructed pt is the potential benefit of the use of a TENs unit in the reduction of her L mid back/inter-scapular pain  [facility] Adult PT Treatment:                                                DATE: 03/13/24 Therapeutic Exercise: UBE 4 mins FWD/BWD L1 Shoulder ER RTB 2x10 Shoulder abd star pattern 2x15 Manual Therapy: STM c TPR to the L interscapular, upper trap, and levator Desensitization of the L interscapular scar with progressive course textures wash clothe, felt tennis ball, canvas belt  Self Care: How to complete densensitization at home using approx 3-4 materials with courseness progressively increasing  PATIENT EDUCATION: Education details: Eval findings, POC, HEP, self care  Person educated: Patient Education  method: Explanation, Demonstration, Tactile cues, Verbal cues, and Handouts Education comprehension: verbalized understanding, returned demonstration, verbal cues required, and tactile cues required  HOME EXERCISE PROGRAM: Access Code: [email or web address] URL: [email or web address] Date: 02/07/2024 Prepared by: Dasie Daft  Exercises - Standing Shoulder Posterior Capsule Stretch  - 2 x daily - 7 x weekly - 1 sets - 3 reps - 30  hold - Standing Thoracic Open Book at Wall  - 1 x daily - 7 x weekly - 3 sets - 5 reps - 5 hold - Seated Flexion Stretch with Swiss Ball  - 2 x daily - 7 x weekly - 1 sets - 10 reps - 5-20 hold - Seated Upper Trapezius Stretch (Mirrored)  - 2 x daily - 7 x weekly - 1 sets - 3 reps - 15 hold - Standing Shoulder Row with Anchored Resistance  - 1 x daily - 7 x weekly - 2 sets - 10 reps - 3 hold - Shoulder extension with resistance - Neutral  - 1 x daily - 7 x weekly - 2 sets - 10 reps - 3 hold - Shoulder External Rotation and Scapular Retraction with Resistance  - 1 x daily - 7 x weekly - 2 sets - 10 reps - 3 hold  ASSESSMENT:  CLINICAL IMPRESSION: Exs were completed for L upper quarter/mid back strengthening with progressive demand. Pt reports tolerating the prescribed exs without significant increase. Pt consistently rates her pain as 10+/10. Pt then received attended PreMod estim around the painful L mid back/interscapula area. During the estim, the pt reported the pain was reduced to 7/10. Pt has a TENs unit at home and plans to bring into her next PT appt for further education on how to use.    EVAL: Patient is a 44 y.o. female who was seen today for physical therapy evaluation and treatment for D17.1 (ICD-10-CM) - Lipoma of back. 1 x week x 4 weeks, Scapular ROM and lower trap strength. Pt presents with significant L interscapular mid back pain and moderate L mid and lower scapula weakness. Otherwise, the AROM and strength of the L shoulder were found equal to the R. With AROMs of the L shoulder, pt demonstrated irregular trunk movements to complete the L arm motions. The trunk compensations appeared related to pain vs strength. Pt's Quick Dash self functional assessment indicates she perceives she is experiencing a high level of disability with her L shoulder girdle. A HEP was started. Pt will benefit from skilled PT 1w4 to address impairments to optimize function with less pain.    OBJECTIVE  IMPAIRMENTS: decreased activity tolerance, decreased strength, increased muscle spasms, and pain.   ACTIVITY LIMITATIONS: carrying, lifting, bathing, toileting, dressing, reach over head, hygiene/grooming, and caring for others  PARTICIPATION LIMITATIONS: meal prep, cleaning, laundry, community activity, and occupation  PERSONAL FACTORS: Past/current experiences, Social background, and 1 comorbidity: cervical disc issue c a pinched nerve down R UE are also affecting patient's functional outcome.   REHAB POTENTIAL: Good  CLINICAL DECISION MAKING: Evolving/moderate complexity  EVALUATION COMPLEXITY: Moderate  GOALS:  SHORT TERM GOALS: Target date: 02/22/24  Pt will be Ind in an initial HEP  Baseline:started Goal status: Ongoing  2.  Pt will report 25% or greater improvement in pain for improved function and QOL Baseline: 7-10/10 03/05/24: No significant change Goal status: Not met  LONG TERM GOALS: Target date: 04/23/24  Pt will be Ind in a final HEP to maintain achieved LOF  Baseline: started Goal status: INITIAL  2.  Pt  will report 50% or greater improvement in pain for improved function and QOL Baseline: 7-10/20 Goal status: INITIAL  3.  Increase mid and lower scapula strength for improved function and quality of mobility Baseline: 3/5 with pain Goal status: INITIAL  4.  Pt will be able to left 3# for improved function with household activities Baseline: NT Goal status: INITIAL  5.  Pt's Quick Dash score will improve by the MCID to 60% as indication of improved function  Baseline:75% disability  Goal status: INITIAL  PLAN: PT FREQUENCY: 1-2x per wk  PT DURATION: 6 weeks  PLANNED INTERVENTIONS: [id number]- PT Re-evaluation, 97110-Therapeutic exercises, 97530- Therapeutic activity, 97112- Neuromuscular re-education, 97535- Self Care, [id number]- Manual therapy, [id number]- Aquatic Therapy, [id number]- Electrical stimulation (unattended), Patient/Family education, Taping, Joint  mobilization, Cryotherapy, and Moist heat  PLAN FOR NEXT SESSION: Assess response to HEP; progress therex as indicated; use of modalities, and manual therapy as indicated.   Aracelli Woloszyn MS, PT 03/19/24 11:10 PM     "

## 2024-03-20 NOTE — Therapy (Signed)
 " OUTPATIENT PHYSICAL THERAPY UPPER EXTREMITY TREATMENT   Patient Name: Katie Benson MRN: [id number] DOB:08-Oct-1980, 44 y.o., female Today's Date: 03/21/2024  END OF SESSION:  PT End of Session - 03/21/24 1234     Visit Number 7    Number of Visits 12    Date for Recertification  04/23/24    Authorization Time Period approved 12 PT visits from 01/29/24-04/27/24    Authorization - Visit Number 7    Authorization - Number of Visits 12    PT Start Time 1234    PT Stop Time 1317    PT Time Calculation (min) 43 min    Activity Tolerance Patient limited by pain                Past Medical History:  Diagnosis Date   Anemia    Arthritis    Fibroids    GERD (gastroesophageal reflux disease)    History of kidney stones    Neck injury    Past Surgical History:  Procedure Laterality Date   CESAREAN SECTION     EXCISION MASS, BACK Left 10/04/2023   Procedure: EXCISION MASS, BACK;  Surgeon: Katie Cordella Hamilton, MD;  Location: [facility];  Service: Orthopedics;  Laterality: Left;  left shoulder lipoma removal   IR ANGIOGRAM PELVIS SELECTIVE OR SUPRASELECTIVE  03/18/2018   IR ANGIOGRAM PELVIS SELECTIVE OR SUPRASELECTIVE  03/18/2018   IR ANGIOGRAM SELECTIVE EACH ADDITIONAL VESSEL  03/18/2018   IR ANGIOGRAM SELECTIVE EACH ADDITIONAL VESSEL  03/18/2018   IR EMBO TUMOR ORGAN ISCHEMIA INFARCT INC GUIDE ROADMAPPING  03/18/2018   IR RADIOLOGIST EVAL & MGMT  10/17/2017   IR RADIOLOGIST EVAL & MGMT  12/20/2017   IR RADIOLOGIST EVAL & MGMT  04/17/2018   IR US  GUIDE VASC ACCESS RIGHT  03/18/2018   KNEE ARTHROSCOPY WITH LATERAL MENISECTOMY Left 12/24/2018   Procedure: LEFT KNEE ARTHROSCOPY WITH LATERAL MENISECTOM, debriment of GANGLION CYST;  Surgeon: Katie Maude ORN, MD;  Location: [facility];  Service: Orthopedics;  Laterality: Left;   PARATHYROIDECTOMY N/A 02/12/2018   Procedure: PARATHYROIDECTOMY;  Surgeon: Katie Nest, MD;  Location: [facility];  Service: General;   Laterality: N/A;   WISDOM TOOTH EXTRACTION     age 44   Patient Active Problem List   Diagnosis Date Noted   Lipoma of back 10/21/2023   History of esophageal ulcer 12/22/2021   Ingrown nail of fifth toe 12/22/2021   Chronic toe pain, left foot 12/22/2021   Blurry vision, bilateral 12/22/2021   Left corneal abrasion 12/22/2021   Osteoarthritis of left knee 12/14/2020   Subacromial bursitis of right shoulder joint 07/15/2020   Cervical radiculopathy 06/24/2020   Effusion, left knee 06/24/2019   Pes anserine bursitis 04/29/2019   Bucket handle tear of lateral meniscus of left knee 12/12/2018   Ganglion cyst 12/12/2018   Fibroids 03/18/2018   S/P parathyroidectomy 02/12/2018   Primary hyperparathyroidism 01/08/2018   Hyperparathyroidism , secondary, non-renal 01/08/2018   Fibroid uterus 12/06/2017   Hypercalcemia 12/06/2017   Fibroid 10/15/2017   Abnormal uterine bleeding (AUB) 08/20/2017   Anemia 08/20/2017    PCP: No PCP  REFERRING PROVIDER: Shirly Carlin CROME, PA-C   REFERRING DIAG: D17.1 (ICD-10-CM) - Lipoma of back   THERAPY DIAG:  Pain in thoracic spine  Muscle weakness (generalized)  Rationale for Evaluation and Treatment: Rehabilitation  ONSET DATE: 10/04/23 Sx  SUBJECTIVE:  SUBJECTIVE STATEMENT: Pt reports her back is aching and tight since the last appt. She notes the pain was relieved during the use of estim her last appt.  EVAL: Pt reports she is experiencing L mid back pain following Sx to remove a lipoma on 10/04/23. Prior to surgery, she notes having pain in this area esp when her back pressed into the bed or back of a chair.  She states being told the surgery was more extensive than expected do to the lipoma's size and being interconnected to the muscle.  Hand dominance:  Right  PERTINENT HISTORY: Pt reports cervical disc issue c a pinch nerve down the R UE and L knee pain  PN note from Katie L. Magnant PA-C 12/24/23 Plan: Patient is a 44 year old female who presents s/p excision of lipoma from upper back on 10/04/2023.  Here today for incision recheck primarily.  Incision is well-healed without evidence of infection or dehiscence.  Patient does complain of slightly increased pain in the shoulder blade region along with muscle spasms in this area.  She has difficulty sleeping.  Still very sensitive to touch in this area and she has pain that bothers her in the upper trapezius that extends into the lower trap region along with some pain in the latissimus area.  Flexeril  helps.  Gabapentin  helps.   On exam, patient has incision that is well-healed.  Intact shrugging of the shoulder.  Excellent rotator cuff strength of the left arm.  She has some weakness of lower trapezius musculature.  This seems secondary to pain.  Intact EPL, FPL, finger abduction of the left arm.   Plan at this time is refill Flexeril .  We will have her start physical therapy on [facility] for scapular mobility and lower trapezius strengthening exercises.  See her back in 6 weeks for clinical recheck.    PAIN:  Are you having pain? Yes: NPRS scale: 7-10/10. Current: 10+/10 Pain location: L interscapular/mid back area Pain description: throb, tight, spasms, sharp Aggravating factors: The use of the L upper quarter- symptoms increase c actively; sleep Relieving factors: Relaxing, heating pad  PRECAUTIONS: None  RED FLAGS: None   WEIGHT BEARING RESTRICTIONS: No  FALLS:  Has patient fallen in last 6 months? 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  DIAGNOSTIC FINDINGS:  See Epic and MD notes  PATIENT SURVEYS :  Quick Dash: 75% disability  Minimally Clinically Important Difference (MCID): 15-20 points  COGNITION: Overall cognitive status: Within functional limits for tasks assessed     SENSATION: Not tested  POSTURE: Forward head and shoulders  UPPER EXTREMITY ROM:   L shoulder AROMs were equal to the R. With all movements pt reported increased pain and pt utilized irregular trunk movements to complete. Active ROM Right eval Left eval  Shoulder flexion 130 130  Shoulder extension    Shoulder abduction    Shoulder adduction    Shoulder internal rotation T11 T11  Shoulder external rotation T4 T4  Elbow flexion    Elbow extension    Wrist flexion    Wrist extension    Wrist ulnar deviation    Wrist radial deviation    Wrist pronation  Wrist supination    (Blank rows = not tested)  UPPER EXTREMITY MMT:  MMT Right eval Left eval  Shoulder flexion 4+ 4+ p  Shoulder extension    Shoulder abduction 4+ 4+ p  Shoulder adduction    Shoulder internal rotation 5 5 p  Shoulder external rotation 5 5 p  Middle trapezius 4 3 p  Lower trapezius 4 3 p  Elbow flexion 5 5  Elbow extension 5 5  Wrist flexion    Wrist extension    Wrist ulnar deviation    Wrist radial deviation    Wrist pronation    Wrist supination    Grip strength (lbs) 83,85=84 68, 70=69  (Blank rows = not tested)  SHOULDER SPECIAL TESTS: Impingement tests: NT SLAP lesions: NT Instability tests: NT Rotator cuff assessment: Drop arm test: negative and Full can test: negative Biceps assessment: NT  PALPATION:  Significantly TTP to the L interscapula area                                                                                                                  TREATMENT DATE:  [facility] Adult PT Treatment:                                                 DATE: 03/22/23 Therapeutic Exercise: Therapeutic Exercise: UBE 4 mins FWD/BWD L2 Low shoulder rows GTB x15 Shoulder ext GTB x15 Shoulder ER RTB x15 Shoulder horz abd x15 Chest press wall pushup x10 Serratus wall slide c pillow case x10 Standing Open books x 5 each Cat/camel x10 5 sec each Self Care: Pt brought in her own TENS unit. Instruction was provided for it's use re: electrode placement, available modes, intensity, and use time frames 20-60 mins c 2 hours between use. Instructed pt in precautions, provided written precautions, and read through pt's TENS manual for precautions provided there as well. Recommended to pt to read through the precautions in her manual.  [facility] Adult PT Treatment:                                                DATE: 03/19/24 Therapeutic Exercise: UBE 4 mins FWD/BWD L2 Low shoulder rows 20# 2x10 60d pectoral stretch L x2 30 The following exs were limited to the L UE with the pt reporting irritation of the R UE (she reports Hx of nerve damage) with previous exs L shoulder flexion to 90d 2x10 2# L Shoulder ER RTB 2x10 L Shoulder ext RTB 2x10 Modalities: PT attended PreMod estim c crossing 4 electrode pattern c 2 channels to assess pt's potential benefit with using a TENs unit at home, intensity 8v each channel Self Care: Instructed pt is the potential benefit of the use of a  TENs unit in the reduction of her L mid back/inter-scapular pain  [facility] Adult PT Treatment:                                                DATE: 03/13/24 Therapeutic Exercise: UBE 4 mins FWD/BWD L1 Shoulder ER RTB 2x10 Shoulder abd star pattern 2x15 Manual Therapy: STM c TPR to the L interscapular, upper trap, and levator Desensitization of the L interscapular scar with progressive course textures wash clothe, felt tennis ball, canvas belt  Self Care: How to complete densensitization at home using approx 3-4 materials with courseness progressively increasing  PATIENT  EDUCATION: Education details: Eval findings, POC, HEP, self care  Person educated: Patient Education method: Explanation, Demonstration, Tactile cues, Verbal cues, and Handouts Education comprehension: verbalized understanding, returned demonstration, verbal cues required, and tactile cues required  HOME EXERCISE PROGRAM: Access Code: [email or web address] URL: [email or web address] Date: 02/07/2024 Prepared by: Dasie Daft  Exercises - Standing Shoulder Posterior Capsule Stretch  - 2 x daily - 7 x weekly - 1 sets - 3 reps - 30 hold - Standing Thoracic Open Book at Wall  - 1 x daily - 7 x weekly - 3 sets - 5 reps - 5 hold - Seated Flexion Stretch with Swiss Ball  - 2 x daily - 7 x weekly - 1 sets - 10 reps - 5-20 hold - Seated Upper Trapezius Stretch (Mirrored)  - 2 x daily - 7 x weekly - 1 sets - 3 reps - 15 hold - Standing Shoulder Row with Anchored Resistance  - 1 x daily - 7 x weekly - 2 sets - 10 reps - 3 hold - Shoulder extension with resistance - Neutral  - 1 x daily - 7 x weekly - 2 sets - 10 reps - 3 hold - Shoulder External Rotation and Scapular Retraction with Resistance  - 1 x daily - 7 x weekly - 2 sets - 10 reps - 3 hold  ASSESSMENT:  CLINICAL IMPRESSION: Pt was completed for bilat upper quarter/upper back strengthening and mobility. Exs were completed for L upper quarter/mid back strengthening. Pt reports discomfort during the exs, but at the end of completed her exs, she reports no overall increase. Pt feels the upcoming winter weather is making her more stiff. Pt continues to report 10+/10 pain levels. Pt Ed was then completed for use and precautions for using a TENS unit. Pt voiced understanding. Pt found pain relief during use of the TENS unit estim.   EVAL: Patient is a 44 y.o. female who was seen today for physical therapy evaluation and treatment for D17.1 (ICD-10-CM) - Lipoma of back. 1 x week x 4 weeks, Scapular ROM and lower trap strength. Pt presents with  significant L interscapular mid back pain and moderate L mid and lower scapula weakness. Otherwise, the AROM and strength of the L shoulder were found equal to the R. With AROMs of the L shoulder, pt demonstrated irregular trunk movements to complete the L arm motions. The trunk compensations appeared related to pain vs strength. Pt's Quick Dash self functional assessment indicates she perceives she is experiencing a high level of disability with her L shoulder girdle. A HEP was started. Pt will benefit from skilled PT 1w4 to address impairments to optimize function with less pain.    OBJECTIVE IMPAIRMENTS: decreased activity tolerance, decreased strength, increased muscle  spasms, and pain.   ACTIVITY LIMITATIONS: carrying, lifting, bathing, toileting, dressing, reach over head, hygiene/grooming, and caring for others  PARTICIPATION LIMITATIONS: meal prep, cleaning, laundry, community activity, and occupation  PERSONAL FACTORS: Past/current experiences, Social background, and 1 comorbidity: cervical disc issue c a pinched nerve down R UE are also affecting patient's functional outcome.   REHAB POTENTIAL: Good  CLINICAL DECISION MAKING: Evolving/moderate complexity  EVALUATION COMPLEXITY: Moderate  GOALS:  SHORT TERM GOALS: Target date: 02/22/24  Pt will be Ind in an initial HEP  Baseline:started Goal status: Ongoing  2.  Pt will report 25% or greater improvement in pain for improved function and QOL Baseline: 7-10/10 03/05/24: No significant change Goal status: Not met  LONG TERM GOALS: Target date: 04/23/24  Pt will be Ind in a final HEP to maintain achieved LOF  Baseline: started Goal status: INITIAL  2.  Pt will report 50% or greater improvement in pain for improved function and QOL Baseline: 7-10/20 Goal status: INITIAL  3.  Increase mid and lower scapula strength for improved function and quality of mobility Baseline: 3/5 with pain Goal status: INITIAL  4.  Pt will be  able to left 3# for improved function with household activities Baseline: NT Goal status: INITIAL  5.  Pt's Quick Dash score will improve by the MCID to 60% as indication of improved function  Baseline:75% disability  Goal status: INITIAL  PLAN: PT FREQUENCY: 1-2x per wk  PT DURATION: 6 weeks  PLANNED INTERVENTIONS: 97164- PT Re-evaluation, 97110-Therapeutic exercises, 97530- Therapeutic activity, 97112- Neuromuscular re-education, 97535- Self Care, [id number]- Manual therapy, [id number]- Aquatic Therapy, [id number]- Electrical stimulation (unattended), Patient/Family education, Taping, Joint mobilization, Cryotherapy, and Moist heat  PLAN FOR NEXT SESSION: Assess response to HEP; progress therex as indicated; use of modalities, and manual therapy as indicated.   Shernell Saldierna MS, PT 03/21/24 2:21 PM     "

## 2024-03-21 ENCOUNTER — Ambulatory Visit

## 2024-03-21 ENCOUNTER — Other Ambulatory Visit: Payer: Self-pay | Admitting: Physician Assistant

## 2024-03-21 DIAGNOSIS — K2100 Gastro-esophageal reflux disease with esophagitis, without bleeding: Secondary | ICD-10-CM

## 2024-03-21 DIAGNOSIS — M546 Pain in thoracic spine: Secondary | ICD-10-CM

## 2024-03-21 DIAGNOSIS — M6281 Muscle weakness (generalized): Secondary | ICD-10-CM

## 2024-03-21 DIAGNOSIS — Z8719 Personal history of other diseases of the digestive system: Secondary | ICD-10-CM

## 2024-03-24 ENCOUNTER — Other Ambulatory Visit: Payer: Self-pay

## 2024-03-25 ENCOUNTER — Ambulatory Visit

## 2024-03-26 NOTE — Therapy (Signed)
 " OUTPATIENT PHYSICAL THERAPY UPPER EXTREMITY TREATMENT   Patient Name: Katie Benson MRN: [id number] DOB:28-Jun-1980, 44 y.o., female Today's Date: 03/27/2024  END OF SESSION:  PT End of Session - 03/27/24 1629     Visit Number 8    Number of Visits 12    Date for Recertification  04/23/24    Authorization Type [place] MEDICAID HEALTHY BLUE    Authorization Time Period approved 12 PT visits from 01/29/24-04/27/24    Authorization - Visit Number 8    Authorization - Number of Visits 12    PT Start Time 1547    PT Stop Time 1630    PT Time Calculation (min) 43 min    Activity Tolerance Patient limited by pain    Behavior During Therapy WFL for tasks assessed/performed                 Past Medical History:  Diagnosis Date   Anemia    Arthritis    Fibroids    GERD (gastroesophageal reflux disease)    History of kidney stones    Neck injury    Past Surgical History:  Procedure Laterality Date   CESAREAN SECTION     EXCISION MASS, BACK Left 10/04/2023   Procedure: EXCISION MASS, BACK;  Surgeon: [name] Cordella Hamilton, MD;  Location: [facility];  Service: Orthopedics;  Laterality: Left;  left shoulder lipoma removal   IR ANGIOGRAM PELVIS SELECTIVE OR SUPRASELECTIVE  03/18/2018   IR ANGIOGRAM PELVIS SELECTIVE OR SUPRASELECTIVE  03/18/2018   IR ANGIOGRAM SELECTIVE EACH ADDITIONAL VESSEL  03/18/2018   IR ANGIOGRAM SELECTIVE EACH ADDITIONAL VESSEL  03/18/2018   IR EMBO TUMOR ORGAN ISCHEMIA INFARCT INC GUIDE ROADMAPPING  03/18/2018   IR RADIOLOGIST EVAL & MGMT  10/17/2017   IR RADIOLOGIST EVAL & MGMT  12/20/2017   IR RADIOLOGIST EVAL & MGMT  04/17/2018   IR US  GUIDE VASC ACCESS RIGHT  03/18/2018   KNEE ARTHROSCOPY WITH LATERAL MENISECTOMY Left 12/24/2018   Procedure: LEFT KNEE ARTHROSCOPY WITH LATERAL MENISECTOM, debriment of GANGLION CYST;  Surgeon: Anderson Maude ORN, MD;  Location: [facility];  Service: Orthopedics;  Laterality: Left;   PARATHYROIDECTOMY N/A 02/12/2018    Procedure: PARATHYROIDECTOMY;  Surgeon: Marolyn Nest, MD;  Location: [facility];  Service: General;  Laterality: N/A;   WISDOM TOOTH EXTRACTION     age 62   Patient Active Problem List   Diagnosis Date Noted   Lipoma of back 10/21/2023   History of esophageal ulcer 12/22/2021   Ingrown nail of fifth toe 12/22/2021   Chronic toe pain, left foot 12/22/2021   Blurry vision, bilateral 12/22/2021   Left corneal abrasion 12/22/2021   Osteoarthritis of left knee 12/14/2020   Subacromial bursitis of right shoulder joint 07/15/2020   Cervical radiculopathy 06/24/2020   Effusion, left knee 06/24/2019   Pes anserine bursitis 04/29/2019   Bucket handle tear of lateral meniscus of left knee 12/12/2018   Ganglion cyst 12/12/2018   Fibroids 03/18/2018   S/P parathyroidectomy 02/12/2018   Primary hyperparathyroidism 01/08/2018   Hyperparathyroidism , secondary, non-renal 01/08/2018   Fibroid uterus 12/06/2017   Hypercalcemia 12/06/2017   Fibroid 10/15/2017   Abnormal uterine bleeding (AUB) 08/20/2017   Anemia 08/20/2017    PCP: No PCP  REFERRING PROVIDER: Shirly Carlin CROME, PA-C   REFERRING DIAG: D17.1 (ICD-10-CM) - Lipoma of back   THERAPY DIAG:  Pain in thoracic spine  Muscle weakness (generalized)  Rationale for Evaluation and Treatment: Rehabilitation  ONSET DATE: 10/04/23 Sx  SUBJECTIVE:                                                                                                                                                                                      SUBJECTIVE STATEMENT: Pt reports with the cold weather her scar and L knee are more inflamed than normal. Pt reports using the TENs unit 1x since the last appt. She states it helped during the estim and for about 2 hours afterward. She wasn't able to complete additionally TENs sessions due to the tackiness of the electrodes wearing off.pt reports she continues to work part time.  EVAL: Pt reports she is  experiencing L mid back pain following Sx to remove a lipoma on 10/04/23. Prior to surgery, she notes having pain in this area esp when her back pressed into the bed or back of a chair.  She states being told the surgery was more extensive than expected do to the lipoma's size and being interconnected to the muscle.  Hand dominance: Right  PERTINENT HISTORY: Pt reports cervical disc issue c a pinch nerve down the R UE and L knee pain  PN note from Charles L. Magnant PA-C 12/24/23 Plan: Patient is a 44 year old female who presents s/p excision of lipoma from upper back on 10/04/2023.  Here today for incision recheck primarily.  Incision is well-healed without evidence of infection or dehiscence.  Patient does complain of slightly increased pain in the shoulder blade region along with muscle spasms in this area.  She has difficulty sleeping.  Still very sensitive to touch in this area and she has pain that bothers her in the upper trapezius that extends into the lower trap region along with some pain in the latissimus area.  Flexeril  helps.  Gabapentin  helps.   On exam, patient has incision that is well-healed.  Intact shrugging of the shoulder.  Excellent rotator cuff strength of the left arm.  She has some weakness of lower trapezius musculature.  This seems secondary to pain.  Intact EPL, FPL, finger abduction of the left arm.   Plan at this time is refill Flexeril .  We will have her start physical therapy on [facility] for scapular mobility and lower trapezius strengthening exercises.  See her back in 6 weeks for clinical recheck.    PAIN:  Are you having pain? Yes: NPRS scale: 7-10/10. Current: 10+(25)/10 Pain location: L interscapular/mid back area Pain description: throb, tight, spasms, sharp Aggravating factors: The use of the L upper quarter- symptoms increase c actively; sleep Relieving factors: Relaxing, heating pad  PRECAUTIONS: None  RED FLAGS: None   WEIGHT BEARING RESTRICTIONS:  No  FALLS:  Has patient  fallen in last 6 months? 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  DIAGNOSTIC FINDINGS:  See Epic and MD notes  PATIENT SURVEYS :  Quick Dash: 75% disability  Minimally Clinically Important Difference (MCID): 15-20 points  COGNITION: Overall cognitive status: Within functional limits for tasks assessed     SENSATION: Not tested  POSTURE: Forward head and shoulders  UPPER EXTREMITY ROM:   L shoulder AROMs were equal to the R. With all movements pt reported increased pain and pt utilized irregular trunk movements to complete. Active ROM Right eval Left eval Lt 03/27/24  Shoulder flexion 130 130 AAROM 150 AROM 135  Shoulder extension     Shoulder abduction     Shoulder adduction     Shoulder internal rotation T11 T11   Shoulder external rotation T4 T4   Elbow flexion     Elbow extension     Wrist flexion     Wrist extension     Wrist ulnar deviation     Wrist radial deviation     Wrist pronation     Wrist supination     (Blank rows = not tested)  UPPER EXTREMITY MMT:  MMT Right eval Left eval  Shoulder flexion 4+ 4+ p  Shoulder extension    Shoulder abduction 4+ 4+ p  Shoulder adduction    Shoulder internal rotation 5 5 p  Shoulder external rotation 5 5 p  Middle trapezius 4 3 p  Lower trapezius 4 3 p  Elbow flexion 5 5  Elbow extension 5 5  Wrist flexion    Wrist extension    Wrist ulnar deviation    Wrist radial deviation    Wrist pronation    Wrist supination    Grip strength (lbs) 83,85=84 68, 70=69  (Blank rows = not tested)  SHOULDER SPECIAL TESTS: Impingement tests: NT SLAP  lesions: NT Instability tests: NT Rotator cuff assessment: Drop arm test: negative and Full can test: negative Biceps assessment: NT  PALPATION:  Significantly TTP to the L interscapula area                                                                                                                  TREATMENT DATE:  [facility] Adult PT Treatment:                                                DATE: 03/27/24 UBE 4 mins FWD/BWD L2 Supine shoulder flex/pull downs c dowel x10 Chest press + x10 4# Low shoulder rows GTB 2x10x3s Shoulder ext GTB 2x10x3s Supine shoulder ER  RTB 2x8x3s RTB Supine shoulder horz abd 2x8x3s RTB Thoracic ext in sitting c soft foam roller 10x3s Standing Open books 10x3s each  [facility] Adult PT Treatment:                                                DATE: 03/21/24 Therapeutic Exercise: Therapeutic Exercise: UBE 4 mins FWD/BWD L2 Low shoulder rows GTB x15 Shoulder ext GTB x15 Shoulder ER RTB x15 Shoulder horz abd x15 Chest press wall pushup x10 Serratus wall slide c pillow case x10 Standing Open books x 5 each Cat/camel x10 5 sec each Self Care: Pt brought in her own TENS unit. Instruction was provided for it's use re: electrode placement, available modes, intensity, and use time frames 20-60 mins c 2 hours between use. Instructed pt in precautions, provided written precautions, and read through pt's TENS manual for precautions provided there as well. Recommended to pt to read through the precautions in her manual.  PATIENT EDUCATION: Education details: Eval findings, POC, HEP, self care  Person educated: Patient Education method: Explanation, Demonstration, Tactile cues, Verbal cues, and Handouts Education comprehension: verbalized understanding, returned demonstration, verbal cues required, and tactile cues required  HOME EXERCISE PROGRAM: Access Code: [email or web address] URL: [email or web address] Date: 02/07/2024 Prepared by: Dasie Daft  Exercises -  Standing Shoulder Posterior Capsule Stretch  - 2 x daily - 7 x weekly - 1 sets - 3 reps - 30 hold - Standing Thoracic Open Book at Wall  - 1 x daily - 7 x weekly - 3 sets - 5 reps - 5 hold - Seated Flexion Stretch with Swiss Ball  - 2 x daily - 7 x weekly - 1 sets - 10 reps - 5-20 hold - Seated Upper Trapezius Stretch (Mirrored)  - 2 x daily - 7 x weekly - 1 sets - 3 reps - 15 hold - Standing Shoulder Row with Anchored Resistance  - 1 x daily - 7 x weekly - 2 sets - 10 reps - 3 hold - Shoulder extension with resistance - Neutral  - 1 x daily - 7 x weekly - 2 sets - 10 reps - 3 hold - Shoulder External Rotation and Scapular Retraction with Resistance  - 1 x daily - 7 x weekly - 2 sets - 10 reps - 3 hold  ASSESSMENT:  CLINICAL IMPRESSION: Pt was completed for L shoulder girdle strength and ROM to promote improved function and quality of movement. Despite reports of pain being 10+, pt states her L interscapular pain is 40% better. With L shoulder flexion, pt demonstrates improved quality of movement with minimal trunk compensations. 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 Pt is making progress with symptoms and use of her L UE.  EVAL: Patient is a 44 y.o. female who was seen today for physical therapy evaluation and treatment for D17.1 (ICD-10-CM) - Lipoma of back. 1 x week x 4 weeks, Scapular ROM and lower trap strength. Pt presents with significant L interscapular mid back pain and moderate L mid and lower scapula weakness. Otherwise, the AROM and strength of the L shoulder were found equal to the R. With AROMs of the L shoulder, pt demonstrated irregular trunk movements to complete the L arm motions. The trunk compensations appeared related to pain vs strength. Pt's Quick Dash self functional assessment indicates she perceives she is  experiencing a high level of disability with her L shoulder girdle. A HEP was started. Pt will benefit from skilled PT 1w4 to address impairments to  optimize function with less pain.    OBJECTIVE IMPAIRMENTS: decreased activity tolerance, decreased strength, increased muscle spasms, and pain.   ACTIVITY LIMITATIONS: carrying, lifting, bathing, toileting, dressing, reach over head, hygiene/grooming, and caring for others  PARTICIPATION LIMITATIONS: meal prep, cleaning, laundry, community activity, and occupation  PERSONAL FACTORS: Past/current experiences, Social background, and 1 comorbidity: cervical disc issue c a pinched nerve down R UE are also affecting patient's functional outcome.   REHAB POTENTIAL: Good  CLINICAL DECISION MAKING: Evolving/moderate complexity  EVALUATION COMPLEXITY: Moderate  GOALS:  SHORT TERM GOALS: Target date: 02/22/24  Pt will be Ind in an initial HEP  Baseline:started Goal status: MET- completing intermittently, continuing to work  2.  Pt will report 25% or greater improvement in pain for improved function and QOL Baseline: 7-10/10 03/05/24: No significant change 03/27/24: pt reports 40% improvement in her L interscapular  Goal status: MET  LONG TERM GOALS: Target date: 04/23/24  Pt will be Ind in a final HEP to maintain achieved LOF  Baseline: started Goal status: INITIAL  2.  Pt will report 50% or greater improvement in pain for improved function and QOL Baseline: 7-10/20 Goal status: INITIAL  3.  Increase mid and lower scapula strength for improved function and quality of mobility Baseline: 3/5 with pain Goal status: INITIAL  4.  Pt will be able to left 3# for improved function with household activities Baseline: NT Goal status: INITIAL  5.  Pt's Quick Dash score will improve by the MCID to 60% as indication of improved function  Baseline:75% disability  Goal status: INITIAL  PLAN: PT FREQUENCY: 1-2x per wk  PT DURATION: 6 weeks  PLANNED INTERVENTIONS: [id number]- PT Re-evaluation, 97110-Therapeutic exercises, 97530- Therapeutic activity, 97112- Neuromuscular re-education, 97535-  Self Care, [id number]- Manual therapy, [id number]- Aquatic Therapy, [id number]- Electrical stimulation (unattended), Patient/Family education, Taping, Joint mobilization, Cryotherapy, and Moist heat  PLAN FOR NEXT SESSION: Assess response to HEP; progress therex as indicated; use of modalities, and manual therapy as indicated.   Devani Odonnel MS, PT 03/27/24 5:01 PM     "

## 2024-03-27 ENCOUNTER — Other Ambulatory Visit: Payer: Self-pay

## 2024-03-27 ENCOUNTER — Other Ambulatory Visit: Payer: Self-pay | Admitting: Physician Assistant

## 2024-03-27 ENCOUNTER — Ambulatory Visit

## 2024-03-27 DIAGNOSIS — K2100 Gastro-esophageal reflux disease with esophagitis, without bleeding: Secondary | ICD-10-CM

## 2024-03-27 DIAGNOSIS — M546 Pain in thoracic spine: Secondary | ICD-10-CM

## 2024-03-27 DIAGNOSIS — M6281 Muscle weakness (generalized): Secondary | ICD-10-CM

## 2024-03-27 DIAGNOSIS — Z8719 Personal history of other diseases of the digestive system: Secondary | ICD-10-CM

## 2024-03-28 ENCOUNTER — Other Ambulatory Visit: Payer: Self-pay

## 2024-03-31 NOTE — Therapy (Incomplete)
 " OUTPATIENT PHYSICAL THERAPY UPPER EXTREMITY TREATMENT   Patient Name: Katie Benson MRN: [id number] DOB:05-08-1980, 44 y.o., female Today's Date: 03/31/2024  END OF SESSION:           Past Medical History:  Diagnosis Date   Anemia    Arthritis    Fibroids    GERD (gastroesophageal reflux disease)    History of kidney stones    Neck injury    Past Surgical History:  Procedure Laterality Date   CESAREAN SECTION     EXCISION MASS, BACK Left 10/04/2023   Procedure: EXCISION MASS, BACK;  Surgeon: [name] Cordella Hamilton, MD;  Location:  [facility];  Service: Orthopedics;  Laterality: Left;  left shoulder lipoma removal   IR ANGIOGRAM PELVIS SELECTIVE OR SUPRASELECTIVE  03/18/2018   IR ANGIOGRAM PELVIS SELECTIVE OR SUPRASELECTIVE  03/18/2018   IR ANGIOGRAM SELECTIVE EACH ADDITIONAL VESSEL  03/18/2018   IR ANGIOGRAM SELECTIVE EACH ADDITIONAL VESSEL  03/18/2018   IR EMBO TUMOR ORGAN ISCHEMIA INFARCT INC GUIDE ROADMAPPING  03/18/2018   IR RADIOLOGIST EVAL & MGMT  10/17/2017   IR RADIOLOGIST EVAL & MGMT  12/20/2017   IR RADIOLOGIST EVAL & MGMT  04/17/2018   IR US  GUIDE VASC ACCESS RIGHT  03/18/2018   KNEE ARTHROSCOPY WITH LATERAL MENISECTOMY Left 12/24/2018   Procedure: LEFT KNEE ARTHROSCOPY WITH LATERAL MENISECTOM, debriment of GANGLION CYST;  Surgeon: Anderson Maude ORN, MD;  Location: [facility];  Service: Orthopedics;  Laterality: Left;   PARATHYROIDECTOMY N/A 02/12/2018   Procedure: PARATHYROIDECTOMY;  Surgeon: Marolyn Nest, MD;  Location: [facility];  Service: General;  Laterality: N/A;   WISDOM TOOTH EXTRACTION     age 61   Patient Active Problem List   Diagnosis Date Noted   Lipoma of back 10/21/2023   History of esophageal ulcer 12/22/2021   Ingrown nail of fifth toe 12/22/2021   Chronic toe pain, left foot 12/22/2021   Blurry vision, bilateral 12/22/2021   Left corneal abrasion 12/22/2021   Osteoarthritis of left knee 12/14/2020   Subacromial bursitis of  right shoulder joint 07/15/2020   Cervical radiculopathy 06/24/2020   Effusion, left knee 06/24/2019   Pes anserine bursitis 04/29/2019   Bucket handle tear of lateral meniscus of left knee 12/12/2018   Ganglion cyst 12/12/2018   Fibroids 03/18/2018   S/P parathyroidectomy 02/12/2018   Primary hyperparathyroidism 01/08/2018   Hyperparathyroidism , secondary, non-renal 01/08/2018   Fibroid uterus 12/06/2017   Hypercalcemia 12/06/2017   Fibroid 10/15/2017   Abnormal uterine bleeding (AUB) 08/20/2017   Anemia 08/20/2017    PCP: No PCP  REFERRING PROVIDER: Shirly Carlin CROME, PA-C   REFERRING DIAG: D17.1 (ICD-10-CM) - Lipoma of back   THERAPY DIAG:  No diagnosis found.  Rationale for Evaluation and Treatment: Rehabilitation  ONSET DATE: 10/04/23 Sx  SUBJECTIVE:  SUBJECTIVE STATEMENT: Pt reports with the cold weather her scar and L knee are more inflamed than normal. Pt reports using the TENs unit 1x since the last appt. She states it helped during the estim and for about 2 hours afterward. She wasn't able to complete additionally TENs sessions due to the tackiness of the electrodes wearing off.pt reports she continues to work part time.  EVAL: Pt reports she is experiencing L mid back pain following Sx to remove a lipoma on 10/04/23. Prior to surgery, she notes having pain in this area esp when her back pressed into the bed or back of a chair.  She states being told the surgery was more extensive than expected do to the lipoma's size and being interconnected to the muscle.  Hand dominance: Right  PERTINENT HISTORY: Pt reports cervical disc issue c a pinch nerve down the R UE and L knee pain  PN note from Charles L. Magnant PA-C 12/24/23 Plan: Patient is a 44 year old female who presents s/p excision of  lipoma from upper back on 10/04/2023.  Here today for incision recheck primarily.  Incision is well-healed without evidence of infection or dehiscence.  Patient does complain of slightly increased pain in the shoulder blade region along with muscle spasms in this area.  She has difficulty sleeping.  Still very sensitive to touch in this area and she has pain that bothers her in the upper trapezius that extends into the lower trap region along with some pain in the latissimus area.  Flexeril  helps.  Gabapentin  helps.   On exam, patient has incision that is well-healed.  Intact shrugging of the shoulder.  Excellent rotator cuff strength of the left arm.  She has some weakness of lower trapezius musculature.  This seems secondary to pain.  Intact EPL, FPL, finger abduction of the left arm.   Plan at this time is refill Flexeril .  We will have her start physical therapy on [facility] for scapular mobility and lower trapezius strengthening exercises.  See her back in 6 weeks for clinical recheck.    PAIN:  Are you having pain? Yes: NPRS scale: 7-10/10. Current: 10+(25)/10 Pain location: L interscapular/mid back area Pain description: throb, tight, spasms, sharp Aggravating factors: The use of the L upper quarter- symptoms increase c actively; sleep Relieving factors: Relaxing, heating pad  PRECAUTIONS: None  RED FLAGS: None   WEIGHT BEARING RESTRICTIONS: No  FALLS:  Has patient fallen in last 6 months? 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  DIAGNOSTIC FINDINGS:  See Epic and MD notes  PATIENT SURVEYS :  Quick Dash: 75% disability  Minimally  Clinically Important Difference (MCID): 15-20 points  COGNITION: Overall cognitive status: Within functional limits for tasks assessed     SENSATION: Not tested  POSTURE: Forward head and shoulders  UPPER EXTREMITY ROM:   L shoulder AROMs were equal to the R. With all movements pt reported increased pain and pt utilized irregular trunk movements to complete. Active ROM Right eval Left eval Lt 03/27/24  Shoulder flexion 130 130 AAROM 150 AROM 135  Shoulder extension     Shoulder abduction     Shoulder adduction     Shoulder  internal rotation T11 T11   Shoulder external rotation T4 T4   Elbow flexion     Elbow extension     Wrist flexion     Wrist extension     Wrist ulnar deviation     Wrist radial deviation     Wrist pronation     Wrist supination     (Blank rows = not tested)  UPPER EXTREMITY MMT:  MMT Right eval Left eval  Shoulder flexion 4+ 4+ p  Shoulder extension    Shoulder abduction 4+ 4+ p  Shoulder adduction    Shoulder internal rotation 5 5 p  Shoulder external rotation 5 5 p  Middle trapezius 4 3 p  Lower trapezius 4 3 p  Elbow flexion 5 5  Elbow extension 5 5  Wrist flexion    Wrist extension    Wrist ulnar deviation    Wrist radial deviation    Wrist pronation    Wrist supination    Grip strength (lbs) 83,85=84 68, 70=69  (Blank rows = not tested)  SHOULDER SPECIAL TESTS: Impingement tests: NT SLAP lesions: NT Instability tests: NT Rotator cuff assessment: Drop arm test: negative and Full can test: negative Biceps assessment: NT  PALPATION:  Significantly TTP to the L interscapula area                                                                                                                  TREATMENT DATE:  [facility] Adult PT Treatment:                                                DATE: 04/01/24 UBE 4 mins FWD/BWD L2 Supine shoulder flex/pull downs c dowel x10 Chest press + x10 4# Low shoulder rows GTB 2x10x3s Shoulder ext GTB  2x10x3s Supine shoulder ER RTB 2x8x3s RTB Supine shoulder horz abd 2x8x3s RTB Thoracic ext in sitting c soft foam roller 10x3s Standing Open books 10 herapeutic Exercise: *** Manual Therapy: *** Neuromuscular re-ed: *** Therapeutic Activity: *** Modalities: *** Self Care: ***  [facility] Adult PT Treatment:                                                DATE: 03/27/24 UBE 4 mins FWD/BWD L2 Supine shoulder flex/pull downs c dowel x10 Chest press + x10 4# Low shoulder rows GTB 2x10x3s Shoulder ext GTB 2x10x3s Supine shoulder ER RTB 2x8x3s RTB Supine shoulder horz abd 2x8x3s RTB Thoracic ext in sitting c soft foam roller 10x3s Standing Open books 10x3s each  [facility] Adult PT Treatment:  DATE: 03/21/24 Therapeutic Exercise: Therapeutic Exercise: UBE 4 mins FWD/BWD L2 Low shoulder rows GTB x15 Shoulder ext GTB x15 Shoulder ER RTB x15 Shoulder horz abd x15 Chest press wall pushup x10 Serratus wall slide c pillow case x10 Standing Open books x 5 each Cat/camel x10 5 sec each Self Care: Pt brought in her own TENS unit. Instruction was provided for it's use re: electrode placement, available modes, intensity, and use time frames 20-60 mins c 2 hours between use. Instructed pt in precautions, provided written precautions, and read through pt's TENS manual for precautions provided there as well. Recommended to pt to read through the precautions in her manual.  PATIENT EDUCATION: Education details: Eval findings, POC, HEP, self care  Person educated: Patient Education method: Explanation, Demonstration, Tactile cues, Verbal cues, and Handouts Education comprehension: verbalized understanding, returned demonstration, verbal cues required, and tactile cues required  HOME EXERCISE PROGRAM: Access Code: [email or web address] URL: [email or web address] Date: 02/07/2024 Prepared by: Dasie Daft  Exercises - Standing Shoulder Posterior  Capsule Stretch  - 2 x daily - 7 x weekly - 1 sets - 3 reps - 30 hold - Standing Thoracic Open Book at Wall  - 1 x daily - 7 x weekly - 3 sets - 5 reps - 5 hold - Seated Flexion Stretch with Swiss Ball  - 2 x daily - 7 x weekly - 1 sets - 10 reps - 5-20 hold - Seated Upper Trapezius Stretch (Mirrored)  - 2 x daily - 7 x weekly - 1 sets - 3 reps - 15 hold - Standing Shoulder Row with Anchored Resistance  - 1 x daily - 7 x weekly - 2 sets - 10 reps - 3 hold - Shoulder extension with resistance - Neutral  - 1 x daily - 7 x weekly - 2 sets - 10 reps - 3 hold - Shoulder External Rotation and Scapular Retraction with Resistance  - 1 x daily - 7 x weekly - 2 sets - 10 reps - 3 hold  ASSESSMENT:  CLINICAL IMPRESSION: Pt was completed for L shoulder girdle strength and ROM to promote improved function and quality of movement. Despite reports of pain being 10+, pt states her L interscapular pain is 40% better. With L shoulder flexion, pt demonstrates improved quality of movement with minimal trunk compensations. 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 Pt is making progress with symptoms and use of her L UE.  EVAL: Patient is a 44 y.o. female who was seen today for physical therapy evaluation and treatment for D17.1 (ICD-10-CM) - Lipoma of back. 1 x week x 4 weeks, Scapular ROM and lower trap strength. Pt presents with significant L interscapular mid back pain and moderate L mid and lower scapula weakness. Otherwise, the AROM and strength of the L shoulder were found equal to the R. With AROMs of the L shoulder, pt demonstrated irregular trunk movements to complete the L arm motions. The trunk compensations appeared related to pain vs strength. Pt's Quick Dash self functional assessment indicates she perceives she is experiencing a high level of disability with her L shoulder girdle. A HEP was started. Pt will benefit from skilled PT 1w4 to address impairments to optimize function with less  pain.    OBJECTIVE IMPAIRMENTS: decreased activity tolerance, decreased strength, increased muscle spasms, and pain.   ACTIVITY LIMITATIONS: carrying, lifting, bathing, toileting, dressing, reach over head, hygiene/grooming, and caring for others  PARTICIPATION LIMITATIONS: meal prep, cleaning, laundry, community activity, and occupation  PERSONAL FACTORS: Past/current experiences, Social background, and 1 comorbidity: cervical disc issue c a pinched nerve down R UE are also affecting patient's functional outcome.   REHAB POTENTIAL: Good  CLINICAL DECISION MAKING: Evolving/moderate complexity  EVALUATION COMPLEXITY: Moderate  GOALS:  SHORT TERM GOALS: Target date: 02/22/24  Pt will be Ind in an initial HEP  Baseline:started Goal status: MET- completing intermittently, continuing to work  2.  Pt will report 25% or greater improvement in pain for improved function and QOL Baseline: 7-10/10 03/05/24: No significant change 03/27/24: pt reports 40% improvement in her L interscapular  Goal status: MET  LONG TERM GOALS: Target date: 04/23/24  Pt will be Ind in a final HEP to maintain achieved LOF  Baseline: started Goal status: INITIAL  2.  Pt will report 50% or greater improvement in pain for improved function and QOL Baseline: 7-10/20 Goal status: INITIAL  3.  Increase mid and lower scapula strength for improved function and quality of mobility Baseline: 3/5 with pain Goal status: INITIAL  4.  Pt will be able to left 3# for improved function with household activities Baseline: NT Goal status: INITIAL  5.  Pt's Quick Dash score will improve by the MCID to 60% as indication of improved function  Baseline:75% disability  Goal status: INITIAL  PLAN: PT FREQUENCY: 1-2x per wk  PT DURATION: 6 weeks  PLANNED INTERVENTIONS: [id number]- PT Re-evaluation, 97110-Therapeutic exercises, 97530- Therapeutic activity, 97112- Neuromuscular re-education, 97535- Self Care, [id number]- Manual  therapy, [id number]- Aquatic Therapy, [id number]- Electrical stimulation (unattended), Patient/Family education, Taping, Joint mobilization, Cryotherapy, and Moist heat  PLAN FOR NEXT SESSION: Assess response to HEP; progress therex as indicated; use of modalities, and manual therapy as indicated.   Samba Cumba MS, PT 03/31/24 9:16 PM     "

## 2024-04-01 ENCOUNTER — Ambulatory Visit

## 2024-04-01 ENCOUNTER — Other Ambulatory Visit: Payer: Self-pay | Admitting: Physician Assistant

## 2024-04-01 ENCOUNTER — Other Ambulatory Visit: Payer: Self-pay

## 2024-04-01 DIAGNOSIS — Z8719 Personal history of other diseases of the digestive system: Secondary | ICD-10-CM

## 2024-04-01 DIAGNOSIS — K2100 Gastro-esophageal reflux disease with esophagitis, without bleeding: Secondary | ICD-10-CM

## 2024-04-02 NOTE — Therapy (Signed)
 " OUTPATIENT PHYSICAL THERAPY UPPER EXTREMITY TREATMENT   Patient Name: Katie Benson MRN: [id number] DOB:01/07/1981, 44 y.o., female Today's Date: 04/03/2024  END OF SESSION:  PT End of Session - 04/03/24 1609     Visit Number 9    Number of Visits 12    Date for Recertification  04/23/24    Authorization Time Period approved 12 PT visits from 01/29/24-04/27/24    Authorization - Visit Number 9    Authorization - Number of Visits 12    PT Start Time 1553    PT Stop Time 1632    PT Time Calculation (min) 39 min                  Past Medical History:  Diagnosis Date   Anemia    Arthritis    Fibroids    GERD (gastroesophageal reflux disease)    History of kidney stones    Neck injury    Past Surgical History:  Procedure Laterality Date   CESAREAN SECTION     EXCISION MASS, BACK Left 10/04/2023   Procedure: EXCISION MASS, BACK;  Surgeon: [name] Cordella Hamilton, MD;  [facility]:  [facility];  Service: Orthopedics;  Laterality: Left;  left shoulder lipoma removal   IR ANGIOGRAM PELVIS SELECTIVE OR SUPRASELECTIVE  03/18/2018   IR ANGIOGRAM PELVIS SELECTIVE OR SUPRASELECTIVE  03/18/2018   IR ANGIOGRAM SELECTIVE EACH ADDITIONAL VESSEL  03/18/2018   IR ANGIOGRAM SELECTIVE EACH ADDITIONAL VESSEL  03/18/2018   IR EMBO TUMOR ORGAN ISCHEMIA INFARCT INC GUIDE ROADMAPPING  03/18/2018   IR RADIOLOGIST EVAL & MGMT  10/17/2017   IR RADIOLOGIST EVAL & MGMT  12/20/2017   IR RADIOLOGIST EVAL & MGMT  04/17/2018   IR US  GUIDE VASC ACCESS RIGHT  03/18/2018   KNEE ARTHROSCOPY WITH LATERAL MENISECTOMY Left 12/24/2018   Procedure: LEFT KNEE ARTHROSCOPY WITH LATERAL MENISECTOM, debriment of GANGLION CYST;  Surgeon: Anderson Maude ORN, MD;  Location: [facility];  Service: Orthopedics;  Laterality: Left;   PARATHYROIDECTOMY N/A 02/12/2018   Procedure: PARATHYROIDECTOMY;  Surgeon: Marolyn Nest, MD;  Location: [facility];  Service: General;  Laterality: N/A;   WISDOM TOOTH EXTRACTION      age 1   Patient Active Problem List   Diagnosis Date Noted   Lipoma of back 10/21/2023   History of esophageal ulcer 12/22/2021   Ingrown nail of fifth toe 12/22/2021   Chronic toe pain, left foot 12/22/2021   Blurry vision, bilateral 12/22/2021   Left corneal abrasion 12/22/2021   Osteoarthritis of left knee 12/14/2020   Subacromial bursitis of right shoulder joint 07/15/2020   Cervical radiculopathy 06/24/2020   Effusion, left knee 06/24/2019   Pes anserine bursitis 04/29/2019   Bucket handle tear of lateral meniscus of left knee 12/12/2018   Ganglion cyst 12/12/2018   Fibroids 03/18/2018   S/P parathyroidectomy 02/12/2018   Primary hyperparathyroidism 01/08/2018   Hyperparathyroidism , secondary, non-renal 01/08/2018   Fibroid uterus 12/06/2017   Hypercalcemia 12/06/2017   Fibroid 10/15/2017   Abnormal uterine bleeding (AUB) 08/20/2017   Anemia 08/20/2017    PCP: No PCP  REFERRING PROVIDER: Shirly Carlin CROME, PA-C   REFERRING DIAG: D17.1 (ICD-10-CM) - Lipoma of back   THERAPY DIAG:  Pain in thoracic spine  Muscle weakness (generalized)  Rationale for Evaluation and Treatment: Rehabilitation  ONSET DATE: 10/04/23 Sx  SUBJECTIVE:  SUBJECTIVE STATEMENT: Pt reports her L interscapular area and knee, and R low back have been hurting with the cold weather. She has not been able to use her TENs unit because she is still waiting on electrodes.  EVAL: Pt reports she is experiencing L mid back pain following Sx to remove a lipoma on 10/04/23. Prior to surgery, she notes having pain in this area esp when her back pressed into the bed or back of a chair.  She states being told the surgery was more extensive than expected do to the lipoma's size and being interconnected to the muscle.  Hand dominance:  Right  PERTINENT HISTORY: Pt reports cervical disc issue c a pinch nerve down the R UE and L knee pain  PN note from Charles L. Magnant PA-C 12/24/23 Plan: Patient is a 44 year old female who presents s/p excision of lipoma from upper back on 10/04/2023.  Here today for incision recheck primarily.  Incision is well-healed without evidence of infection or dehiscence.  Patient does complain of slightly increased pain in the shoulder blade region along with muscle spasms in this area.  She has difficulty sleeping.  Still very sensitive to touch in this area and she has pain that bothers her in the upper trapezius that extends into the lower trap region along with some pain in the latissimus area.  Flexeril  helps.  Gabapentin  helps.   On exam, patient has incision that is well-healed.  Intact shrugging of the shoulder.  Excellent rotator cuff strength of the left arm.  She has some weakness of lower trapezius musculature.  This seems secondary to pain.  Intact EPL, FPL, finger abduction of the left arm.   Plan at this time is refill Flexeril .  We will have her start physical therapy on [facility] for scapular mobility and lower trapezius strengthening exercises.  See her back in 6 weeks for clinical recheck.    PAIN:  Are you having pain? Yes: NPRS scale: 7-10/10. Current: 10+(15)/10 Pain location: L interscapular/mid back area Pain description: throb, tight, spasms, sharp Aggravating factors: The use of the L upper quarter- symptoms increase c actively; sleep Relieving factors: Relaxing, heating pad  PRECAUTIONS: None  RED FLAGS: None   WEIGHT BEARING RESTRICTIONS: No  FALLS:  Has patient fallen in last 6 months? 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  DIAGNOSTIC FINDINGS:  See Epic and MD notes  PATIENT SURVEYS :  Quick Dash: 75% disability  Minimally Clinically Important Difference (MCID): 15-20 points  COGNITION: Overall cognitive status: Within functional limits for tasks assessed     SENSATION: Not tested  POSTURE: Forward head and shoulders  UPPER EXTREMITY ROM:   L shoulder AROMs were equal to the R. With all movements pt reported increased pain and pt utilized irregular trunk movements to complete. Active ROM Right eval Left eval Lt 03/27/24  Shoulder flexion 130 130 AAROM 150 AROM 135  Shoulder extension     Shoulder abduction     Shoulder adduction     Shoulder internal rotation T11 T11   Shoulder external rotation T4 T4   Elbow flexion     Elbow extension     Wrist flexion  Wrist extension     Wrist ulnar deviation     Wrist radial deviation     Wrist pronation     Wrist supination     (Blank rows = not tested)  UPPER EXTREMITY MMT:  MMT Right eval Left eval  Shoulder flexion 4+ 4+ p  Shoulder extension    Shoulder abduction 4+ 4+ p  Shoulder adduction    Shoulder internal rotation 5 5 p  Shoulder external rotation 5 5 p  Middle trapezius 4 3 p  Lower trapezius 4 3 p  Elbow flexion 5 5  Elbow extension 5 5  Wrist flexion    Wrist extension    Wrist ulnar deviation    Wrist radial deviation    Wrist pronation    Wrist supination    Grip strength (lbs) 83,85=84 68, 70=69  (Blank rows = not tested)  SHOULDER SPECIAL TESTS: Impingement tests: NT SLAP lesions: NT Instability tests: NT Rotator cuff assessment: Drop arm test: negative and Full can test: negative Biceps assessment: NT  PALPATION:  Significantly TTP to the L interscapula area                                                                                                                  TREATMENT DATE:  [facility]  Adult PT Treatment:                                                DATE: 04/03/24 UBE 4 mins FWD/BWD L2 Upper trap stretch Pball roll outs fwd and lat Counter push ups 2x10x3 Low shoulder rows 2x8x3s FM 14# Shoulder ext 2x8x3s FM 14# Supine shoulder ER RTB 2x8x3s RTB Thoracic ext in sitting c soft foam roller 10x3s Paloff press RTB x10 each  [facility] Adult PT Treatment:                                                DATE: 03/27/24 UBE 4 mins FWD/BWD L2 Supine shoulder flex/pull downs c dowel x10 Chest press + x10 4# Low shoulder rows GTB 2x10x3s Shoulder ext GTB 2x10x3s Supine shoulder ER RTB 2x8x3s RTB Supine shoulder horz abd 2x8x3s RTB Thoracic ext in sitting c soft foam roller 10x3s Standing Open books 10x3s each  [facility] Adult PT Treatment:                                                DATE: 03/21/24 Therapeutic Exercise: Therapeutic Exercise: UBE 4 mins FWD/BWD L2 Low shoulder rows GTB x15 Shoulder ext GTB x15 Shoulder ER RTB x15 Shoulder horz abd x15 Chest press wall pushup x10 Serratus wall  slide c pillow case x10 Standing Open books x 5 each Cat/camel x10 5 sec each Self Care: Pt brought in her own TENS unit. Instruction was provided for it's use re: electrode placement, available modes, intensity, and use time frames 20-60 mins c 2 hours between use. Instructed pt in precautions, provided written precautions, and read through pt's TENS manual for precautions provided there as well. Recommended to pt to read through the precautions in her manual.  PATIENT EDUCATION: Education details: Eval findings, POC, HEP, self care  Person educated: Patient Education method: Explanation, Demonstration, Tactile cues, Verbal cues, and Handouts Education comprehension: verbalized understanding, returned demonstration, verbal cues required, and tactile cues required  HOME EXERCISE PROGRAM: Access Code: [email or web address] URL: [email or web address] Date: 02/07/2024 Prepared by:  Dasie Daft  Exercises - Standing Shoulder Posterior Capsule Stretch  - 2 x daily - 7 x weekly - 1 sets - 3 reps - 30 hold - Standing Thoracic Open Book at Wall  - 1 x daily - 7 x weekly - 3 sets - 5 reps - 5 hold - Seated Flexion Stretch with Swiss Ball  - 2 x daily - 7 x weekly - 1 sets - 10 reps - 5-20 hold - Seated Upper Trapezius Stretch (Mirrored)  - 2 x daily - 7 x weekly - 1 sets - 3 reps - 15 hold - Standing Shoulder Row with Anchored Resistance  - 1 x daily - 7 x weekly - 2 sets - 10 reps - 3 hold - Shoulder extension with resistance - Neutral  - 1 x daily - 7 x weekly - 2 sets - 10 reps - 3 hold - Shoulder External Rotation and Scapular Retraction with Resistance  - 1 x daily - 7 x weekly - 2 sets - 10 reps - 3 hold  ASSESSMENT:  CLINICAL IMPRESSION: PT was continued for L shoulder girdle (periscapular and RC) strengthening and ROM for improved function and quality of movement of the L UE. Pt reports pain with each exercise, but is able to complete the prescribed exs. Pt tolerated the prescribed exs without adverse effects.   EVAL: Patient is a 44 y.o. female who was seen today for physical therapy evaluation and treatment for D17.1 (ICD-10-CM) - Lipoma of back. 1 x week x 4 weeks, Scapular ROM and lower trap strength. Pt presents with significant L interscapular mid back pain and moderate L mid and lower scapula weakness. Otherwise, the AROM and strength of the L shoulder were found equal to the R. With AROMs of the L shoulder, pt demonstrated irregular trunk movements to complete the L arm motions. The trunk compensations appeared related to pain vs strength. Pt's Quick Dash self functional assessment indicates she perceives she is experiencing a high level of disability with her L shoulder girdle. A HEP was started. Pt will benefit from skilled PT 1w4 to address impairments to optimize function with less pain.    OBJECTIVE IMPAIRMENTS: decreased activity tolerance, decreased  strength, increased muscle spasms, and pain.   ACTIVITY LIMITATIONS: carrying, lifting, bathing, toileting, dressing, reach over head, hygiene/grooming, and caring for others  PARTICIPATION LIMITATIONS: meal prep, cleaning, laundry, community activity, and occupation  PERSONAL FACTORS: Past/current experiences, Social background, and 1 comorbidity: cervical disc issue c a pinched nerve down R UE are also affecting patient's functional outcome.   REHAB POTENTIAL: Good  CLINICAL DECISION MAKING: Evolving/moderate complexity  EVALUATION COMPLEXITY: Moderate  GOALS:  SHORT TERM GOALS: Target date: 02/22/24  Pt will be Ind in an initial  HEP  Baseline:started Goal status: MET- completing intermittently, continuing to work  2.  Pt will report 25% or greater improvement in pain for improved function and QOL Baseline: 7-10/10 03/05/24: No significant change 03/27/24: pt reports 40% improvement in her L interscapular  Goal status: MET  LONG TERM GOALS: Target date: 04/23/24  Pt will be Ind in a final HEP to maintain achieved LOF  Baseline: started Goal status: INITIAL  2.  Pt will report 50% or greater improvement in pain for improved function and QOL Baseline: 7-10/20 Goal status: INITIAL  3.  Increase mid and lower scapula strength for improved function and quality of mobility Baseline: 3/5 with pain Goal status: INITIAL  4.  Pt will be able to left 3# for improved function with household activities Baseline: NT Goal status: INITIAL  5.  Pt's Quick Dash score will improve by the MCID to 60% as indication of improved function  Baseline:75% disability  Goal status: INITIAL  PLAN: PT FREQUENCY: 1-2x per wk  PT DURATION: 6 weeks  PLANNED INTERVENTIONS: [id number]- PT Re-evaluation, 97110-Therapeutic exercises, 97530- Therapeutic activity, 97112- Neuromuscular re-education, 97535- Self Care, [id number]- Manual therapy, [id number]- Aquatic Therapy, [id number]- Electrical stimulation (unattended),  Patient/Family education, Taping, Joint mobilization, Cryotherapy, and Moist heat  PLAN FOR NEXT SESSION: Assess response to HEP; progress therex as indicated; use of modalities, and manual therapy as indicated.   Lonita Debes MS, PT 04/03/24 4:41 PM     "

## 2024-04-03 ENCOUNTER — Other Ambulatory Visit: Payer: Self-pay

## 2024-04-03 ENCOUNTER — Ambulatory Visit

## 2024-04-03 DIAGNOSIS — M6281 Muscle weakness (generalized): Secondary | ICD-10-CM

## 2024-04-03 DIAGNOSIS — M546 Pain in thoracic spine: Secondary | ICD-10-CM

## 2024-04-08 ENCOUNTER — Ambulatory Visit

## 2024-04-10 ENCOUNTER — Ambulatory Visit

## 2024-04-25 ENCOUNTER — Ambulatory Visit: Admitting: Surgical
